# Patient Record
Sex: Male | Born: 1995 | Race: White | Hispanic: No | Marital: Single | Smoking: Never smoker
Health system: Southern US, Community
[De-identification: ages and names within clinical notes are randomized; demographics above are authoritative.]

## PROBLEM LIST (undated history)

## (undated) ENCOUNTER — Ambulatory Visit: Admission: EM | Payer: 59

## (undated) DIAGNOSIS — F419 Anxiety disorder, unspecified: Secondary | ICD-10-CM

## (undated) DIAGNOSIS — B029 Zoster without complications: Secondary | ICD-10-CM

## (undated) DIAGNOSIS — J4520 Mild intermittent asthma, uncomplicated: Secondary | ICD-10-CM

## (undated) DIAGNOSIS — R51 Headache: Secondary | ICD-10-CM

## (undated) DIAGNOSIS — G8929 Other chronic pain: Secondary | ICD-10-CM

## (undated) DIAGNOSIS — R519 Headache, unspecified: Secondary | ICD-10-CM

## (undated) DIAGNOSIS — K5790 Diverticulosis of intestine, part unspecified, without perforation or abscess without bleeding: Secondary | ICD-10-CM

## (undated) DIAGNOSIS — S42009A Fracture of unspecified part of unspecified clavicle, initial encounter for closed fracture: Secondary | ICD-10-CM

## (undated) DIAGNOSIS — Z91018 Allergy to other foods: Secondary | ICD-10-CM

## (undated) DIAGNOSIS — Z8619 Personal history of other infectious and parasitic diseases: Secondary | ICD-10-CM

## (undated) DIAGNOSIS — D126 Benign neoplasm of colon, unspecified: Secondary | ICD-10-CM

## (undated) HISTORY — PX: WISDOM TOOTH EXTRACTION: SHX21

## (undated) HISTORY — DX: Diverticulosis of intestine, part unspecified, without perforation or abscess without bleeding: K57.90

## (undated) HISTORY — DX: Allergy to other foods: Z91.018

## (undated) HISTORY — DX: Benign neoplasm of colon, unspecified: D12.6

## (undated) HISTORY — DX: Other chronic pain: G89.29

## (undated) HISTORY — DX: Anxiety disorder, unspecified: F41.9

## (undated) HISTORY — PX: ESOPHAGOGASTRODUODENOSCOPY: SHX1529

## (undated) HISTORY — DX: Headache: R51

## (undated) HISTORY — DX: Personal history of other infectious and parasitic diseases: Z86.19

## (undated) HISTORY — PX: COLONOSCOPY: SHX174

## (undated) HISTORY — DX: Fracture of unspecified part of unspecified clavicle, initial encounter for closed fracture: S42.009A

## (undated) HISTORY — DX: Headache, unspecified: R51.9

## (undated) HISTORY — DX: Mild intermittent asthma, uncomplicated: J45.20

## (undated) HISTORY — DX: Zoster without complications: B02.9

---
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
## 2017-11-09 ENCOUNTER — Ambulatory Visit (HOSPITAL_COMMUNITY)
Admission: RE | Admit: 2017-11-09 | Discharge: 2017-11-09 | Disposition: A | Discharge status: Home / Self Care | Payer: 59 | Source: Ambulatory Visit | Attending: Physician Assistant | Admitting: Physician Assistant

## 2017-11-09 ENCOUNTER — Other Ambulatory Visit (HOSPITAL_COMMUNITY): Payer: Self-pay | Admitting: Physician Assistant

## 2017-11-09 DIAGNOSIS — Z5309 Procedure and treatment not carried out because of other contraindication: Secondary | ICD-10-CM

## 2017-11-09 DIAGNOSIS — Z7689 Persons encountering health services in other specified circumstances: Secondary | ICD-10-CM | POA: Diagnosis not present

## 2017-11-09 DIAGNOSIS — R51 Headache: Principal | ICD-10-CM

## 2017-11-09 DIAGNOSIS — R519 Headache, unspecified: Secondary | ICD-10-CM

## 2017-11-11 ENCOUNTER — Ambulatory Visit (HOSPITAL_COMMUNITY)
Admission: RE | Admit: 2017-11-11 | Discharge: 2017-11-11 | Disposition: A | Discharge status: Home / Self Care | Payer: 59 | Source: Ambulatory Visit | Attending: Physician Assistant | Admitting: Physician Assistant

## 2017-11-11 DIAGNOSIS — R51 Headache: Secondary | ICD-10-CM | POA: Insufficient documentation

## 2017-11-11 DIAGNOSIS — R519 Headache, unspecified: Secondary | ICD-10-CM

## 2017-11-11 MED ORDER — GADOBENATE DIMEGLUMINE 529 MG/ML IV SOLN
20.0000 mL | Freq: Once | INTRAVENOUS | Status: AC | PRN
  Administered 2017-11-11: 20 mL via INTRAVENOUS

## 2017-11-12 ENCOUNTER — Other Ambulatory Visit: Payer: Self-pay | Admitting: Physician Assistant

## 2017-11-12 DIAGNOSIS — R51 Headache: Principal | ICD-10-CM

## 2017-11-12 DIAGNOSIS — G4486 Cervicogenic headache: Secondary | ICD-10-CM

## 2017-11-13 ENCOUNTER — Ambulatory Visit: Payer: 59 | Admitting: Neurology

## 2017-11-13 ENCOUNTER — Encounter: Payer: Self-pay | Admitting: Neurology

## 2017-11-13 ENCOUNTER — Other Ambulatory Visit: Payer: Self-pay

## 2017-11-13 ENCOUNTER — Telehealth: Payer: Self-pay | Admitting: Neurology

## 2017-11-13 VITALS — BP 141/83 | HR 95 | Ht 77.0 in | Wt 339.0 lb

## 2017-11-13 DIAGNOSIS — R9089 Other abnormal findings on diagnostic imaging of central nervous system: Secondary | ICD-10-CM

## 2017-11-13 MED ORDER — PREDNISONE 5 MG PO TABS: ORAL_TABLET | ORAL | 0 refills | Status: DC
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
## 2017-11-13 NOTE — Telephone Encounter (Signed)
I called the patient.  The patient likely has a migraine type headache, the patient does have a blood vessel abnormality of some sort in the head that likely is benign, we will follow this over time with a repeat MRI of the brain.

## 2017-11-13 NOTE — Patient Instructions (Signed)
  We will try a 6 day course of prednisone.

## 2017-11-13 NOTE — Telephone Encounter (Signed)
Pt states he does not recall his diagnosis, he is asking for a call back with it please

## 2017-11-14 ENCOUNTER — Ambulatory Visit: Payer: 59 | Admitting: Neurology

## 2017-11-14 ENCOUNTER — Encounter: Payer: Self-pay | Admitting: Neurology

## 2017-11-15 ENCOUNTER — Ambulatory Visit: Payer: 59 | Admitting: Neurology

## 2017-11-19 ENCOUNTER — Other Ambulatory Visit: Payer: Self-pay | Admitting: Neurology

## 2017-11-19 ENCOUNTER — Encounter: Payer: Self-pay | Admitting: Neurology

## 2017-11-19 MED ORDER — PROPRANOLOL HCL 20 MG PO TABS: 20.0000 mg | ORAL_TABLET | Freq: Two times a day (BID) | ORAL | 3 refills | Status: DC

## 2017-11-23 ENCOUNTER — Encounter: Payer: Self-pay | Admitting: Neurology

## 2017-11-26 ENCOUNTER — Ambulatory Visit: Payer: 59 | Admitting: Physician Assistant

## 2017-11-26 ENCOUNTER — Encounter: Payer: Self-pay | Admitting: Physician Assistant

## 2017-11-26 ENCOUNTER — Other Ambulatory Visit: Payer: Self-pay

## 2017-11-26 VITALS — BP 120/80 | HR 62 | Temp 98.3°F | Resp 16 | Ht 77.0 in | Wt 338.0 lb

## 2017-11-26 DIAGNOSIS — L810 Postinflammatory hyperpigmentation: Secondary | ICD-10-CM

## 2017-11-26 DIAGNOSIS — R9389 Abnormal findings on diagnostic imaging of other specified body structures: Secondary | ICD-10-CM | POA: Diagnosis not present

## 2017-11-26 DIAGNOSIS — L738 Other specified follicular disorders: Secondary | ICD-10-CM

## 2017-11-26 NOTE — Progress Notes (Signed)
Patient presents to clinic today to establish care.  Acute Concerns: Patient with chronic headaches, followed by Neurology (Dr. Jannifer Franklin). Recently had an MRI which was discussed with him in detail by Neurologist. Is nervous about findings and wanted a second opinion regarding results.   Patient also endorses a bump on posterior scrotum that has been present for several years. Is not painful or pruritic. Denies change in size of lesion. Has had prior to ever being sexually active.   Patient also notes a dark spot on his back that he would like looked at. States he previously had a boil in this area that drained on its own and was treated with ABX. Has noticed the skin being much darker since then. Denies pain, itch.   Health Maintenance: Immunizations -- up-to-date per patient.  Past Medical History:  Diagnosis Date  . Chronic headaches   . Clavicular fracture   . History of chickenpox   . Shingles    22 years old.    Past Surgical History:  Procedure Laterality Date  . WISDOM TOOTH EXTRACTION      Current Outpatient Medications on File Prior to Visit  Medication Sig Dispense Refill  . propranolol (INDERAL) 20 MG tablet Take 1 tablet (20 mg total) by mouth 2 (two) times daily. 60 tablet 3   No current facility-administered medications on file prior to visit.     No Known Allergies  Family History  Problem Relation Age of Onset  . Hypertension Mother   . Diabetes Father        type II  . Migraines Sister   . Diabetes Maternal Grandmother     Social History   Socioeconomic History  . Marital status: Single    Spouse name: Not on file  . Number of children: 0  . Years of education: College  . Highest education level: Not on file  Social Needs  . Financial resource strain: Not on file  . Food insecurity - worry: Not on file  . Food insecurity - inability: Not on file  . Transportation needs - medical: Not on file  . Transportation needs - non-medical: Not on  file  Occupational History  . Occupation: Sales executive  Tobacco Use  . Smoking status: Never Smoker  . Smokeless tobacco: Never Used  Substance and Sexual Activity  . Alcohol use: Yes    Frequency: Never    Comment: maybe 3 per week  . Drug use: Yes    Types: Marijuana    Comment: rare use  . Sexual activity: Yes    Partners: Female  Other Topics Concern  . Not on file  Social History Narrative   Lives alone   Caffeine use: 1 cup coffee per day   Right handed    Review of Systems  Constitutional: Negative for chills and fever.  HENT: Negative for hearing loss.   Eyes: Negative for blurred vision and double vision.  Respiratory: Negative for cough and sputum production.   Cardiovascular: Negative for chest pain and palpitations.  Skin: Negative for itching and rash.       + skin lesions.  Neurological: Positive for headaches. Negative for dizziness and loss of consciousness.  Psychiatric/Behavioral: Negative for depression, hallucinations, substance abuse and suicidal ideas. The patient is not nervous/anxious.    BP 120/80   Pulse 62   Temp 98.3 F (36.8 C) (Oral)   Resp 16   Ht 6\' 5"  (1.956 m)   Wt (!) 338 lb (153.3 kg)  SpO2 99%   BMI 40.08 kg/m   Physical Exam  Constitutional: He is oriented to person, place, and time and well-developed, well-nourished, and in no distress.  HENT:  Head: Normocephalic and atraumatic.  Eyes: Conjunctivae and EOM are normal. Pupils are equal, round, and reactive to light.  Neck: Neck supple.  Cardiovascular: Normal rate, regular rhythm, normal heart sounds and intact distal pulses.  Pulmonary/Chest: Effort normal and breath sounds normal. No respiratory distress. He has no wheezes. He has no rales. He exhibits no tenderness.  Genitourinary: Testes/scrotum normal and penis normal.  Genitourinary Comments: 1 cc cutaneous lesion of scrotum identified. Is mobile and soft. No fluctuance. Seems consistent with an enlarged ectopic  sebaceous gland or fordyce spot.  Neurological: He is alert and oriented to person, place, and time.  Skin: Skin is warm and dry. No rash noted.     Psychiatric: Affect normal.  Vitals reviewed.  Assessment/Plan: 1. Postinflammatory hyperpigmentation Reassurance given. This will take time to lighten. Reviewed OTC preparations that can help but are not a necessity.  2. Sebaceous gland hyperplasia of scrotum Reassurance given. These are only an issue if they become inflamed or infected. Reviewed symptoms/changes to look out for. Otherwise GU examination unremarkable.  3. Abnormal MRI MRI obtained by specialist due to headaches. MRI revealed "4 mm focus of juxtacortical enhancement in the parasagittal left frontal lobe. No edema or other lesions are identified. A tiny vascular malformation is favored, and a follow-up brain MRI is ecommended in 6 months to ensure stability." Agree with specialist that this is nothing to be overly concerned about. Does not seem consistent with an aneurysm but do recommend he follow-up with Neurology as scheduled and have repeat MRI/MRA in 3-6 months as recommended by Neurology and Radiology.    Leeanne Rio, PA-C

## 2017-11-26 NOTE — Patient Instructions (Signed)
It was nice to meet you today.  Please continue the care directed by Dr. Jannifer Franklin. I agree with his assessment of your MRI results. I would recommend that you do proceed with repeat MRI and MRA in 3 months to verify stability. No change to activity level or daily life needed!  The area on the back is an area of postinflammatory hyperpigmentation, darkening of the skin secondary to inflammation. This will take some time to return to a lighter shade but is nothing worrisome.  The lesion on the scrotum seems like an enlarged ectopic sebaceous (oil) gland. Nothing worrisome. Nothing further needed unless it ever gets red, angry or tender (signs of infection).

## 2017-11-27 ENCOUNTER — Encounter: Payer: Self-pay | Admitting: Physician Assistant

## 2017-11-27 ENCOUNTER — Ambulatory Visit: Payer: 59 | Admitting: Physician Assistant

## 2017-11-27 ENCOUNTER — Ambulatory Visit: Payer: Self-pay

## 2017-11-27 ENCOUNTER — Other Ambulatory Visit: Payer: Self-pay

## 2017-11-27 VITALS — BP 124/74 | HR 73 | Temp 98.4°F | Resp 17 | Ht 77.0 in | Wt 339.6 lb

## 2017-11-27 DIAGNOSIS — F419 Anxiety disorder, unspecified: Secondary | ICD-10-CM | POA: Diagnosis not present

## 2017-11-27 MED ORDER — ESCITALOPRAM OXALATE 10 MG PO TABS: 10.0000 mg | ORAL_TABLET | Freq: Every day | ORAL | 1 refills | Status: DC

## 2017-11-27 MED ORDER — CLONAZEPAM 0.25 MG PO TBDP: 0.2500 mg | ORAL_TABLET | Freq: Two times a day (BID) | ORAL | 0 refills | Status: DC | PRN

## 2017-11-27 NOTE — Patient Instructions (Signed)
Please stay well-hydrated and get plenty of rest. Start the lexapro once daily as directed. Klonopin as directed if needed for acute anxiety.  Follow-up with me in 4 weeks.  Please go to the lab today for blood work.  I will call you with your results. We will alter treatment regimen(s) if indicated by your results.

## 2017-11-27 NOTE — Progress Notes (Signed)
Patient presents to clinic today c/o generalized anxiety over the past few years that has recently worsened due to health stressors. Notes continued generalized anxiety with repetitive thoughts and worry. Is affecting his day to day life. Denies depressed mood or anhedonia. Has noted over the past 3 days of having episodes of chest tightness or pain lasting a few seconds each before resolving. Seem to happen when he is anxious. Denies heart burn, indigestion, cough, fever, chills, palpitations, LH, dizziness or SOB. Episodes occur at rest and are not worsened with exacerbation.   Past Medical History:  Diagnosis Date  . Anxiety   . Chronic headaches   . Clavicular fracture   . History of chickenpox   . Shingles    22 years old.    Current Outpatient Medications on File Prior to Visit  Medication Sig Dispense Refill  . propranolol (INDERAL) 20 MG tablet Take 1 tablet (20 mg total) by mouth 2 (two) times daily. 60 tablet 3   No current facility-administered medications on file prior to visit.     No Known Allergies  Family History  Problem Relation Age of Onset  . Hypertension Mother   . Diabetes Father        type II  . Migraines Sister   . Diabetes Maternal Grandmother     Social History   Socioeconomic History  . Marital status: Single    Spouse name: None  . Number of children: 0  . Years of education: College  . Highest education level: None  Social Needs  . Financial resource strain: None  . Food insecurity - worry: None  . Food insecurity - inability: None  . Transportation needs - medical: None  . Transportation needs - non-medical: None  Occupational History  . Occupation: Sales executive  Tobacco Use  . Smoking status: Never Smoker  . Smokeless tobacco: Never Used  Substance and Sexual Activity  . Alcohol use: Yes    Frequency: Never    Comment: maybe 3 per week  . Drug use: Yes    Types: Marijuana    Comment: rare use  . Sexual activity: Yes   Partners: Female  Other Topics Concern  . None  Social History Narrative   Lives alone   Caffeine use: 1 cup coffee per day   Right handed    Review of Systems - See HPI.  All other ROS are negative.  BP 124/74   Pulse 73   Temp 98.4 F (36.9 C) (Oral)   Resp 17   Ht 6\' 5"  (1.956 m)   Wt (!) 339 lb 9.6 oz (154 kg)   SpO2 98%   BMI 40.27 kg/m   Physical Exam  Constitutional: He is oriented to person, place, and time and well-developed, well-nourished, and in no distress.  HENT:  Head: Normocephalic.  Eyes: Conjunctivae are normal.  Neck: Neck supple.  Cardiovascular: Normal rate, regular rhythm, normal heart sounds and intact distal pulses.  Pulmonary/Chest: Effort normal and breath sounds normal. No respiratory distress. He has no wheezes. He has no rales. He exhibits no tenderness.  Abdominal: Soft. Bowel sounds are normal. He exhibits no distension. There is no tenderness. There is no rebound and no guarding.  Neurological: He is alert and oriented to person, place, and time. No cranial nerve deficit.  Skin: Skin is warm and dry. No rash noted.  Psychiatric: Affect normal.  Vitals reviewed.  Assessment/Plan: 1. Anxiety Long-standing GAD with worsening symptoms recently due to health stressors. Patient  low risk for ACS. Symptoms do not fit a cardiac picture. EKG obtained with NSR. Will start Lexapro 10 mg daily. Klonopin PRN for acute anxiety. Will check TSH today. Strict ER precautions given to patient. Follow-up scheduled.   - EKG 12-Lead - TSH - escitalopram (LEXAPRO) 10 MG tablet; Take 1 tablet (10 mg total) by mouth daily.  Dispense: 30 tablet; Refill: 1 - clonazePAM (KLONOPIN) 0.25 MG disintegrating tablet; Take 1 tablet (0.25 mg total) by mouth 2 (two) times daily as needed for seizure.  Dispense: 10 tablet; Refill: 0   Leeanne Rio, PA-C

## 2017-11-27 NOTE — Telephone Encounter (Signed)
Provider has been alerted of this appointment.

## 2017-11-27 NOTE — Telephone Encounter (Signed)
Patient called in with c/o "chest pain and anxiety." He says "I was just seen in the office yesterday and mention about anxiety. I would like to come in today if possible to discuss it. The pain is off and on, not having pain now." Appointment scheduled for today with Elyn Aquas, PAC at 1500, patient verbalized understanding.

## 2017-11-28 ENCOUNTER — Ambulatory Visit: Payer: 59 | Admitting: Physician Assistant

## 2017-11-28 DIAGNOSIS — F419 Anxiety disorder, unspecified: Secondary | ICD-10-CM | POA: Insufficient documentation

## 2017-11-28 LAB — TSH: TSH: 1.02 u[IU]/mL (ref 0.35–4.50)

## 2017-11-29 ENCOUNTER — Encounter: Payer: Self-pay | Admitting: Physician Assistant

## 2017-11-29 ENCOUNTER — Encounter: Payer: Self-pay | Admitting: Neurology

## 2017-11-30 ENCOUNTER — Encounter: Payer: Self-pay | Admitting: Physician Assistant

## 2017-11-30 ENCOUNTER — Ambulatory Visit: Payer: 59 | Admitting: Physician Assistant

## 2017-12-01 ENCOUNTER — Encounter: Payer: Self-pay | Admitting: Physician Assistant

## 2017-12-01 DIAGNOSIS — G8929 Other chronic pain: Secondary | ICD-10-CM

## 2017-12-01 DIAGNOSIS — M25562 Pain in left knee: Principal | ICD-10-CM

## 2017-12-05 ENCOUNTER — Encounter: Payer: Self-pay | Admitting: Physician Assistant

## 2017-12-05 ENCOUNTER — Ambulatory Visit: Payer: 59 | Admitting: Physician Assistant

## 2017-12-05 ENCOUNTER — Other Ambulatory Visit: Payer: Self-pay

## 2017-12-05 VITALS — BP 136/82 | HR 64 | Temp 98.1°F | Resp 16 | Ht 77.0 in | Wt 337.0 lb

## 2017-12-05 DIAGNOSIS — G8929 Other chronic pain: Secondary | ICD-10-CM | POA: Diagnosis not present

## 2017-12-05 DIAGNOSIS — M25562 Pain in left knee: Secondary | ICD-10-CM

## 2017-12-05 DIAGNOSIS — N50819 Testicular pain, unspecified: Secondary | ICD-10-CM | POA: Diagnosis not present

## 2017-12-05 LAB — POCT URINALYSIS DIPSTICK
BILIRUBIN UA: NEGATIVE
Blood, UA: NEGATIVE
GLUCOSE UA: NEGATIVE
KETONES UA: NEGATIVE
LEUKOCYTES UA: NEGATIVE
Nitrite, UA: NEGATIVE
Protein, UA: NEGATIVE
SPEC GRAV UA: 1.025 (ref 1.010–1.025)
Urobilinogen, UA: 0.2 E.U./dL
pH, UA: 5 (ref 5.0–8.0)

## 2017-12-05 NOTE — Patient Instructions (Signed)
Please keep hydrated. Wear supportive undergarments.  Ibuprofen for the next 3-4 days (knee and groin)  If any recurrence, we will want to get you back in with Urology.   For the knee, I am setting you up with one of our Sports Medicine specialists. Wear the knee brace.  Elevate the leg while resting.

## 2017-12-05 NOTE — Progress Notes (Signed)
Patient presents to clinic today to discuss chronic intermittent pain in L knee. States when pain occurs, the pain is mostly posterior and aching. Sometimes notes some aching pain underneath patella. Endorses a hyperextension injury about 6 years ago. Has noted this intermittent pain since. States he has had unremarkable x-rays previously. States that pain occurs maybe once or twice a month and lasts 1-2 days. When this occurs he wears a knee brace and applies topical Icy Hot to the area which helps. Denies swelling, redness. Denies numbness or tingling of extremity. Denies decreased ROM but endorses tightness with flexion of the knee.   Patient also notes a mild ache in testicles this morning with urination. Denies urinary urgency, frequency or hematuria. Denies nausea/vomiting. Denies penile pain or discharge. Is not currently sexually active. Notes that he has seen a Urologist for aching in L testicle before. Notes he had an [place] and was told he had a varicocele.   Past Medical History:  Diagnosis Date  . Anxiety   . Chronic headaches   . Clavicular fracture   . History of chickenpox   . Shingles    22 years old.    Current Outpatient Medications on File Prior to Visit  Medication Sig Dispense Refill  . clonazePAM (KLONOPIN) 0.25 MG disintegrating tablet Take 1 tablet (0.25 mg total) by mouth 2 (two) times daily as needed for seizure. 10 tablet 0  . escitalopram (LEXAPRO) 10 MG tablet Take 1 tablet (10 mg total) by mouth daily. 30 tablet 1  . propranolol (INDERAL) 20 MG tablet Take 1 tablet (20 mg total) by mouth 2 (two) times daily. 60 tablet 3   No current facility-administered medications on file prior to visit.     No Known Allergies  Family History  Problem Relation Age of Onset  . Hypertension Mother   . Diabetes Father        type II  . Migraines Sister   . Diabetes Maternal Grandmother     Social History   Socioeconomic History  . Marital status: Single    Spouse  name: None  . Number of children: 0  . Years of education: College  . Highest education level: None  Social Needs  . Financial resource strain: None  . Food insecurity - worry: None  . Food insecurity - inability: None  . Transportation needs - medical: None  . Transportation needs - non-medical: None  Occupational History  . Occupation: Sales executive  Tobacco Use  . Smoking status: Never Smoker  . Smokeless tobacco: Never Used  Substance and Sexual Activity  . Alcohol use: Yes    Frequency: Never    Comment: maybe 3 per week  . Drug use: Yes    Types: Marijuana    Comment: rare use  . Sexual activity: Yes    Partners: Female  Other Topics Concern  . None  Social History Narrative   Lives alone   Caffeine use: 1 cup coffee per day   Right handed    Review of Systems - See HPI.  All other ROS are negative.  BP 136/82   Pulse 64   Temp 98.1 F (36.7 C) (Oral)   Resp 16   Ht 6\' 5"  (1.956 m)   Wt (!) 337 lb (152.9 kg)   SpO2 98%   BMI 39.96 kg/m   Physical Exam  Constitutional: He is oriented to person, place, and time and well-developed, well-nourished, and in no distress.  HENT:  Head: Normocephalic and  atraumatic.  Eyes: Conjunctivae are normal.  Cardiovascular: Normal rate, regular rhythm, normal heart sounds and intact distal pulses.  Pulmonary/Chest: Effort normal and breath sounds normal. No respiratory distress. He has no wheezes. He has no rales. He exhibits no tenderness.  Genitourinary: Penis normal. He exhibits no abnormal testicular mass, no testicular tenderness, no abnormal scrotal mass and no scrotal tenderness.  Musculoskeletal:       Left knee: Normal. No tenderness found.  Neurological: He is alert and oriented to person, place, and time.  Skin: Skin is warm and dry. No rash noted.  Vitals reviewed.  Recent Results (from the past 2160 hour(s))  TSH     Status: None   Collection Time: 11/27/17  3:51 PM  Result Value Ref Range   TSH 1.02 0.35  - 4.50 uIU/mL  POCT urinalysis dipstick     Status: Normal   Collection Time: 12/05/17  1:51 PM  Result Value Ref Range   Color, UA yellow    Clarity, UA clear    Glucose, UA negative    Bilirubin, UA negative    Ketones, UA negative    Spec Grav, UA 1.025 1.010 - 1.025   Blood, UA negative    pH, UA 5.0 5.0 - 8.0   Protein, UA negative    Urobilinogen, UA 0.2 0.2 or 1.0 E.U./dL   Nitrite, UA negative    Leukocytes, UA Negative Negative   Appearance     Odor      Assessment/Plan: 1. Testicular pain One episode this morning. Resolved. Urine dip unremarkable. Exam unremarkable. Patient with history of left varicocele noted on [place] from last fall. Discussed supportive measures. If recurring need follow-up with Urology. - POCT urinalysis dipstick  2. Chronic pain of left knee Intermittent. Cannot reproduce on examination. RICE discussed. Bracing applied. Will set up with Sports medicine for further assessment.    Leeanne Rio, PA-C

## 2017-12-08 ENCOUNTER — Emergency Department (HOSPITAL_COMMUNITY): Payer: 59

## 2017-12-08 ENCOUNTER — Encounter (HOSPITAL_COMMUNITY): Payer: Self-pay | Admitting: Emergency Medicine

## 2017-12-08 ENCOUNTER — Emergency Department (HOSPITAL_COMMUNITY)
Admission: EM | Admit: 2017-12-08 | Discharge: 2017-12-08 | Disposition: A | Discharge status: Home / Self Care | Payer: 59 | Attending: Emergency Medicine | Admitting: Emergency Medicine

## 2017-12-08 DIAGNOSIS — R0789 Other chest pain: Secondary | ICD-10-CM | POA: Insufficient documentation

## 2017-12-08 DIAGNOSIS — Z79899 Other long term (current) drug therapy: Secondary | ICD-10-CM | POA: Diagnosis not present

## 2017-12-08 LAB — CBC
HEMATOCRIT: 46.6 % (ref 39.0–52.0)
HEMOGLOBIN: 15.4 g/dL (ref 13.0–17.0)
MCH: 30.6 pg (ref 26.0–34.0)
MCHC: 33 g/dL (ref 30.0–36.0)
MCV: 92.5 fL (ref 78.0–100.0)
Platelets: 219 10*3/uL (ref 150–400)
RBC: 5.04 MIL/uL (ref 4.22–5.81)
RDW: 12.6 % (ref 11.5–15.5)
WBC: 7.6 10*3/uL (ref 4.0–10.5)

## 2017-12-08 LAB — BASIC METABOLIC PANEL
ANION GAP: 8 (ref 5–15)
BUN: 10 mg/dL (ref 6–20)
CHLORIDE: 104 mmol/L (ref 101–111)
CO2: 27 mmol/L (ref 22–32)
Calcium: 9.6 mg/dL (ref 8.9–10.3)
Creatinine, Ser: 0.76 mg/dL (ref 0.61–1.24)
Glucose, Bld: 95 mg/dL (ref 65–99)
POTASSIUM: 4.3 mmol/L (ref 3.5–5.1)
SODIUM: 139 mmol/L (ref 135–145)

## 2017-12-08 LAB — I-STAT TROPONIN, ED: Troponin i, poc: 0.01 ng/mL (ref 0.00–0.08)

## 2017-12-08 LAB — D-DIMER, QUANTITATIVE (NOT AT ARMC)

## 2017-12-08 NOTE — ED Triage Notes (Signed)
Patient here from home with complaints of chest pain x1 week. Reports that chest pain is in the center and radiates to the left side of chest. Seen primary 3/13, no diagnosis.

## 2017-12-08 NOTE — ED Notes (Signed)
Pt left after given paperwork, refused to sign.

## 2017-12-08 NOTE — Discharge Instructions (Signed)

## 2017-12-08 NOTE — ED Provider Notes (Signed)
[facility] DEPT Provider Note   CSN: [id number] Arrival date & time: 12/08/17  1248     History   Chief Complaint Chief Complaint  Patient presents with  . Chest Pain    HPI SHONN FARRUGGIA is a 22 y.o. male who presents emergency department chief complaint of chest pain.  Patient states that he has had intermittent chest pain which comes and goes.  It is retrosternal.  At times he had pain on the left or on the right and down his right arm yesterday.  He is being treated for anxiety.  He had an EKG at his primary care doctor's office a week ago that was normal.  Patient states that he was working out running a mile yesterday and he noticed the pain a bit more.  He denies diaphoresis, shortness of breath, nausea, radiating pain into the jaw or arm.  He has noticed more belching as of late.  He has no current chest pain.  Patient had a physical this year.  He states he has no high cholesterol, no high blood pressure, no family history of MI.  He does not smoke.  No history of diabetes  HPI  Past Medical History:  Diagnosis Date  . Anxiety   . Chronic headaches   . Clavicular fracture   . History of chickenpox   . Shingles    22 years old.    Patient Active Problem List   Diagnosis Date Noted  . Anxiety 11/28/2017    Past Surgical History:  Procedure Laterality Date  . WISDOM TOOTH EXTRACTION         Home Medications    Prior to Admission medications   Medication Sig Start Date End Date Taking? Authorizing Provider  acetaminophen (TYLENOL) 500 MG tablet Take 1,000 mg by mouth every 6 (six) hours as needed for mild pain or headache.   Yes [provider]  baclofen (LIORESAL) 20 MG tablet Take 20 mg by mouth daily as needed for muscle spasms.   Yes [provider]  clonazePAM (KLONOPIN) 0.25 MG disintegrating tablet Take 1 tablet (0.25 mg total) by mouth 2 (two) times daily as needed for seizure. 11/27/17  Yes Brunetta Jeans, PA-C  escitalopram (LEXAPRO) 10 MG tablet Take 1 tablet (10 mg total) by mouth daily. 11/27/17  Yes Brunetta Jeans, PA-C  propranolol (INDERAL) 20 MG tablet Take 1 tablet (20 mg total) by mouth 2 (two) times daily. 11/19/17  Yes Kathrynn Ducking, MD    Family History Family History  Problem Relation Age of Onset  . Hypertension Mother   . Diabetes Father        type II  . Migraines Sister   . Diabetes Maternal Grandmother     Social History Social History   Tobacco Use  . Smoking status: Never Smoker  . Smokeless tobacco: Never Used  Substance Use Topics  . Alcohol use: Yes    Frequency: Never    Comment: maybe 3 per week  . Drug use: Yes    Types: Marijuana    Comment: rare use     Allergies   Hydrocodone   Review of Systems Review of Systems Ten systems reviewed and are negative for acute change, except as noted in the HPI.   Physical Exam Updated Vital Signs BP (!) 143/95 (BP Location: Left Arm)   Pulse 68   Temp 98.5 F (36.9 C) (Oral)   Resp 18   Ht 6\' 5"  (1.956  m)   Wt (!) 152 kg (335 lb)   SpO2 99%   BMI 39.73 kg/m   Physical Exam  Constitutional: He appears well-developed and well-nourished. No distress.  HENT:  Head: Normocephalic and atraumatic.  Eyes: Conjunctivae are normal. No scleral icterus.  Neck: Normal range of motion. Neck supple.  Cardiovascular: Normal rate, regular rhythm and normal heart sounds.  Pulmonary/Chest: Effort normal and breath sounds normal. No respiratory distress.  Abdominal: Soft. There is no tenderness.  Musculoskeletal: He exhibits no edema.  Neurological: He is alert.  Skin: Skin is warm and dry. He is not diaphoretic.  Psychiatric: His behavior is normal.  Nursing note and vitals reviewed.    ED Treatments / Results  Labs (all labs ordered are listed, but only abnormal results are displayed) Labs Reviewed  BASIC METABOLIC PANEL  CBC  D-DIMER, QUANTITATIVE (NOT AT [facility])  I-STAT TROPONIN, ED     EKG  EKG Interpretation  Date/Time:  Saturday December 08 2017 13:19:07 EDT Ventricular Rate:  67 PR Interval:    QRS Duration: 104 QT Interval:  371 QTC Calculation: 392 R Axis:   36 Text Interpretation:  Sinus rhythm NO STEMI No old tracing to compare Confirmed by Addison Lank [phone]) on 12/08/2017 8:55:00 PM       Radiology Dg Chest 2 View  Result Date: 12/08/2017 CLINICAL DATA:  Chest pain.  Short of breath. EXAM: CHEST - 2 VIEW COMPARISON:  None. FINDINGS: Normal heart size. Lungs clear. No pneumothorax. No pleural effusion. IMPRESSION: No active cardiopulmonary disease. Electronically Signed   By: Marybelle Killings M.D.   On: 12/08/2017 14:41    Procedures Procedures (including critical care time)  Medications Ordered in ED Medications - No data to display   Initial Impression / Assessment and Plan / ED Course  I have reviewed the triage vital signs and the nursing notes.  Pertinent labs & imaging results that were available during my care of the patient were reviewed by me and considered in my medical decision making (see chart for details).     Patient with heart score of 1, EKG without abnormality, negative chest x-ray, normal troponin.  PERC negative.  Advised follow-up with cardiology for stress test.  He appears appropriate for discharge at this time  Final Clinical Impressions(s) / ED Diagnoses   Final diagnoses:  Atypical chest pain    ED Discharge Orders    None       Margarita Mail, PA-C 12/08/17 2056    Fatima Blank, MD 12/09/17 [phone]

## 2017-12-12 NOTE — Telephone Encounter (Signed)
Referral hs been placed.

## 2017-12-13 ENCOUNTER — Encounter: Payer: Self-pay | Admitting: Physician Assistant

## 2017-12-13 ENCOUNTER — Telehealth: Payer: Self-pay | Admitting: Physician Assistant

## 2017-12-13 NOTE — Telephone Encounter (Signed)
Copied from [place] [phone]. Topic: Quick Communication - See Telephone Encounter >> Dec 13, 2017  3:52 PM Neva Seat wrote: Pt needing to discuss Lipase levels on recent labs w/ Dr. Hassell Done or CMA - ASAP

## 2017-12-13 NOTE — Telephone Encounter (Signed)
Patient states that he had bloodwork done and his lipase level is elevated.  I do not see any results since we drew blood.  I have responded to his MyChart message asking where he had the labs drawn.  Once we know that we can better advise him on what he needs to do.

## 2017-12-14 ENCOUNTER — Ambulatory Visit: Payer: 59 | Admitting: Physician Assistant

## 2017-12-14 ENCOUNTER — Telehealth: Payer: Self-pay | Admitting: Physician Assistant

## 2017-12-14 ENCOUNTER — Encounter: Payer: Self-pay | Admitting: Physician Assistant

## 2017-12-14 ENCOUNTER — Ambulatory Visit (HOSPITAL_BASED_OUTPATIENT_CLINIC_OR_DEPARTMENT_OTHER)
Admission: RE | Admit: 2017-12-14 | Discharge: 2017-12-14 | Disposition: A | Discharge status: Home / Self Care | Payer: 59 | Source: Ambulatory Visit | Attending: Physician Assistant | Admitting: Physician Assistant

## 2017-12-14 ENCOUNTER — Ambulatory Visit: Payer: 59 | Admitting: Sports Medicine

## 2017-12-14 ENCOUNTER — Other Ambulatory Visit: Payer: Self-pay

## 2017-12-14 ENCOUNTER — Encounter: Payer: Self-pay | Admitting: Sports Medicine

## 2017-12-14 VITALS — BP 118/80 | HR 64 | Ht 77.0 in | Wt 338.2 lb

## 2017-12-14 VITALS — BP 122/74 | HR 69 | Temp 97.8°F | Resp 16 | Ht 77.0 in | Wt 338.2 lb

## 2017-12-14 DIAGNOSIS — R1084 Generalized abdominal pain: Secondary | ICD-10-CM

## 2017-12-14 DIAGNOSIS — G8929 Other chronic pain: Secondary | ICD-10-CM

## 2017-12-14 DIAGNOSIS — M25562 Pain in left knee: Secondary | ICD-10-CM | POA: Diagnosis not present

## 2017-12-14 LAB — CBC WITH DIFFERENTIAL/PLATELET
BASOS PCT: 0.8 % (ref 0.0–3.0)
Basophils Absolute: 0.1 10*3/uL (ref 0.0–0.1)
EOS ABS: 0.1 10*3/uL (ref 0.0–0.7)
Eosinophils Relative: 2 % (ref 0.0–5.0)
HCT: 43.2 % (ref 39.0–52.0)
Hemoglobin: 14.7 g/dL (ref 13.0–17.0)
LYMPHS ABS: 2 10*3/uL (ref 0.7–4.0)
Lymphocytes Relative: 27.2 % (ref 12.0–46.0)
MCHC: 33.9 g/dL (ref 30.0–36.0)
MCV: 90 fl (ref 78.0–100.0)
MONO ABS: 0.4 10*3/uL (ref 0.1–1.0)
Monocytes Relative: 6 % (ref 3.0–12.0)
NEUTROS ABS: 4.8 10*3/uL (ref 1.4–7.7)
NEUTROS PCT: 64 % (ref 43.0–77.0)
PLATELETS: 199 10*3/uL (ref 150.0–400.0)
RBC: 4.8 Mil/uL (ref 4.22–5.81)
RDW: 12.9 % (ref 11.5–15.5)
WBC: 7.5 10*3/uL (ref 4.0–10.5)

## 2017-12-14 LAB — COMPREHENSIVE METABOLIC PANEL
ALT: 16 U/L (ref 0–53)
AST: 12 U/L (ref 0–37)
Albumin: 4.5 g/dL (ref 3.5–5.2)
Alkaline Phosphatase: 60 U/L (ref 39–117)
BUN: 15 mg/dL (ref 6–23)
CHLORIDE: 105 meq/L (ref 96–112)
CO2: 24 meq/L (ref 19–32)
CREATININE: 0.78 mg/dL (ref 0.40–1.50)
Calcium: 9.3 mg/dL (ref 8.4–10.5)
GFR: 131.72 mL/min (ref >60.00)
GLUCOSE: 103 mg/dL — AB (ref 70–99)
POTASSIUM: 4.1 meq/L (ref 3.5–5.1)
SODIUM: 139 meq/L (ref 135–145)
Total Bilirubin: 0.5 mg/dL (ref 0.2–1.2)
Total Protein: 6.9 g/dL (ref 6.0–8.3)

## 2017-12-14 LAB — LIPASE: Lipase: 18 U/L (ref 11.0–59.0)

## 2017-12-14 NOTE — Telephone Encounter (Signed)
Provider is aware and is contacting patient via mychart

## 2017-12-14 NOTE — Progress Notes (Signed)
   Bradley [name], Bradley [name] at [facility] - 22 y.o. male MRN [id number]  Date of birth: [birth year]  Visit Date:   PCP: Brunetta Jeans, PA-C   Referred by: Brunetta Jeans, PA-C  Please see additional documentation for HPI, review of systems.   HISTORY & PERTINENT PRIOR DATA:  Prior History reviewed and updated per electronic medical record.  Significant/pertinent history, findings, studies include:  reports that he has never smoked. He has never used smokeless tobacco. No results for input(s): HGBA1C, LABURIC, CREATINE in the last 8760 hours. No specialty comments available. No problems updated.  OBJECTIVE:  VS:  HT:6\' 5"  (195.6 cm)   WT:(Abnormal) 338 lb 3.2 oz (153.4 kg)  BMI:40.1    BP:118/80  HR:64bpm  TEMP: ( )  RESP:97 %   PHYSICAL EXAM: Constitutional: WDWN, Non-toxic appearing. Psychiatric: Alert & appropriately interactive.  Not depressed or anxious appearing. Respiratory: No increased work of breathing.  Trachea Midline Eyes: Pupils are equal.  EOM intact without nystagmus.  No scleral icterus  Vascular Exam: warm to touch no edema  lower extremity neuro exam: unremarkable  MSK Exam:  Bilateral Knee  Alignment & Contours: normal Skin: No overlying erythema/ecchymosis Effusion: none and not significant   Generalized Synovitis: mild Knee Tenderness: None and No focal bony TTP Gait: normal and antalgic   RANGE OF MOTION & STRENGTH  EXTENSION: 0  with no pain Strength: Normal FLEXION: Normal with no pain Strength: Normal Hip Abduction strength- 4/5 Recruitment pattern: TFL predominant   LIGAMENTOUS TESTING  Stable to varus and valgus strain with 3-4 mm of opening bilaterally. Anterior and posterior drawer with slight laxity but no appreciable endpoints symmetrically.  He does have some laxity with dial testing markedly different on the left than on the right   SPECIALITY  TESTING:  Crepitation with Patellar Grind: No Patellar Apprehension: No J Sign: Negative Mcmurray's: Negative Thessaly: Negative         ASSESSMENT & PLAN:   1. Chronic pain of left knee     PLAN: I am concerned for potential posterior lateral corner injury given the mechanism of stepping into a hole.  He does have some laxity on the left as well and the question of an underlying ACL tear with posterior lateral corner has to be ruled out.  He does have some symptoms of instability.  Follow-up: Return for MRI review.      Please see additional documentation for Objective, Assessment and Plan sections. Pertinent additional documentation may be included in corresponding procedure notes, imaging studies, problem based documentation and patient instructions. Please see these sections of the encounter for additional information regarding this visit.  CMA/ATC served as Education administrator during this visit. History, Physical, and Plan performed by medical provider. Documentation and orders reviewed and attested to.      Gerda Diss, [facility] Physician

## 2017-12-14 NOTE — Telephone Encounter (Signed)
Copied from [place] [phone]. Topic: Quick Communication - See Telephone Encounter >> Dec 14, 2017 10:04 AM [name] wrote: CRM for notification. See Telephone encounter for: 12/14/17. Patient faxed some labs into the office today.  He wanted his pcp to know he has been feeling nauseated and he can come in today if necessary.

## 2017-12-14 NOTE — Progress Notes (Signed)
  Bradley [name] - 22 y.o. male MRN [id number]  Date of birth: [birth year]  Scribe for today's visit: Josepha Pigg, CMA     SUBJECTIVE:  Bradley [name] is here for Follow-up (L knee pain) .  Referred by: Raiford Noble, PA-C His L knee pain symptoms INITIALLY: Began at age 22 after a fall while running. More recently he has been playing football and noticed an increase in pain, he was seen at urgent care 08/29/17. Described as moderate (6/10) soreness/tenderness, nonradiating Worsened with weight bearing.  Improved with rest.  Additional associated symptoms include: He has noticed clicking and popping in the knee. He denies swelling around the knee. Pain is mostly just under the patella and/or posterior knee. Pain comes and goes and seems to occur randomly.     At this time symptoms are improving compared to onset. For the first 8 months after the initial injury he had pain for 8 months but it has been getting better over time with occasional flare-up.  He finds that sitting in a hot tub helps with the pain. He has tried NSAIDs in the past with minimal relief. He has tried wearing a knee brace in the past with minimal relief. He has also tried applying topical [facility] and gets temporary relief.   Had XR of L knee 08/2017 at [facility] on [facility] - was told it was normal but does not have a copy of XR or report with him today.   ROS Denies night time disturbances. Denies fevers, chills, or night sweats. Denies unexplained weight loss. Denies personal history of cancer. Denies changes in bowel or bladder habits. Denies recent unreported falls. Denies new or worsening dyspnea or wheezing. Denies headaches or dizziness.  Denies numbness, tingling or weakness  In the extremities.  Denies dizziness or presyncopal episodes Denies lower extremity edema      Please see additional documentation for Objective, Assessment and Plan sections. Pertinent additional documentation may be  included in corresponding procedure notes, imaging studies, problem based documentation and patient instructions. Please see these sections of the encounter for additional information regarding this visit.  CMA/ATC served as Education administrator during this visit. History, Physical, and Plan performed by medical provider. Documentation and orders reviewed and attested to.      Gerda Diss, [facility] Physician

## 2017-12-14 NOTE — Patient Instructions (Signed)
Please go to the lab today for blood work.  I will call you with your results. We will alter treatment regimen(s) if indicated by your results.   Keep a liquid diet overnight.  Hold the Lexapro.  Message me in the morning to give me an update on how things are with the changes above.   I have given you a script for Zofran to use in case of severe nausea.  Keep well hydrated and get plenty of rest.   We will figure this out!  Speak with Benjamine Mola up front to schedule Ultrasound.

## 2017-12-14 NOTE — Telephone Encounter (Signed)
Paperwork has been sent over and provider has been alerted.  Will wait on PCP to advise.

## 2017-12-14 NOTE — Progress Notes (Signed)
Patient presents to clinic today c/o intermittent nausea and lower abdominal pain over the past week. Notes stools have been somewhat loose. Notes a bloated sensation. Denies fever, chills or change to diet or urinary symptoms. Was recently started on Lexapro and questioning if this is contributing. Patient states he had lipase level drawn from an employee of our [place] office and was mildly elevated. Was told he needed to be seen.  Past Medical History:  Diagnosis Date  . Anxiety   . Chronic headaches   . Clavicular fracture   . History of chickenpox   . Shingles    22 years old.    Current Outpatient Medications on File Prior to Visit  Medication Sig Dispense Refill  . acetaminophen (TYLENOL) 500 MG tablet Take 1,000 mg by mouth every 6 (six) hours as needed for mild pain or headache.    . clonazePAM (KLONOPIN) 0.25 MG disintegrating tablet Take 1 tablet (0.25 mg total) by mouth 2 (two) times daily as needed for seizure. 10 tablet 0  . escitalopram (LEXAPRO) 10 MG tablet Take 1 tablet (10 mg total) by mouth daily. 30 tablet 1  . propranolol (INDERAL) 20 MG tablet Take 1 tablet (20 mg total) by mouth 2 (two) times daily. 60 tablet 3   No current facility-administered medications on file prior to visit.     Allergies  Allergen Reactions  . Hydrocodone Itching    Pt states he feels itchy after taking, but it doesn't preclude him taking hydrocodone if needed    Family History  Problem Relation Age of Onset  . Hypertension Mother   . Diabetes Father        type II  . Migraines Sister   . Diabetes Maternal Grandmother     Social History   Socioeconomic History  . Marital status: Single    Spouse name: Not on file  . Number of children: 0  . Years of education: College  . Highest education level: Not on file  Occupational History  . Occupation: [employer]  . Financial resource strain: Not on file  . Food insecurity:    Worry: Not on file    Inability:  Not on file  . Transportation needs:    Medical: Not on file    Non-medical: Not on file  Tobacco Use  . Smoking status: Never Smoker  . Smokeless tobacco: Never Used  Substance and Sexual Activity  . Alcohol use: Yes    Frequency: Never    Comment: maybe 3 per week  . Drug use: Yes    Types: Marijuana    Comment: rare use  . Sexual activity: Yes    Partners: Female  Lifestyle  . Physical activity:    Days per week: Not on file    Minutes per session: Not on file  . Stress: Not on file  Relationships  . Social connections:    Talks on phone: Not on file    Gets together: Not on file    Attends religious service: Not on file    Active member of club or organization: Not on file    Attends meetings of clubs or organizations: Not on file    Relationship status: Not on file  Other Topics Concern  . Not on file  Social History Narrative   Lives alone   Caffeine use: 1 cup coffee per day   Right handed    Review of Systems - See HPI.  All other ROS are  negative.  BP 122/74   Pulse 69   Temp 97.8 F (36.6 C) (Oral)   Resp 16   Ht _0  (1.956 m)   Wt (!) 338 lb 3.2 oz (153.4 kg)   SpO2 98%   BMI 40.10 kg/m   Physical Exam  Constitutional: He is oriented to person, place, and time and well-developed, well-nourished, and in no distress.  HENT:  Head: Normocephalic and atraumatic.  Eyes: Conjunctivae are normal.  Neck: Neck supple.  Cardiovascular: Normal rate, regular rhythm, normal heart sounds and intact distal pulses.  Pulmonary/Chest: Effort normal and breath sounds normal. No respiratory distress. He has no wheezes. He has no rales. He exhibits no tenderness.  Abdominal: Soft. He exhibits no distension and no mass. There is generalized tenderness. There is no rebound and no guarding.  Neurological: He is alert and oriented to person, place, and time.  Skin: Skin is warm and dry. No rash noted.  Psychiatric: Affect normal.  Vitals reviewed.  Recent Results  (from the past 2160 hour(s))  TSH     Status: None   Collection Time: 11/27/17  3:51 PM  Result Value Ref Range   TSH 1.02 0.35 - 4.50 uIU/mL  POCT urinalysis dipstick     Status: Normal   Collection Time: 12/05/17  1:51 PM  Result Value Ref Range   Color, UA yellow    Clarity, UA clear    Glucose, UA negative    Bilirubin, UA negative    Ketones, UA negative    Spec Grav, UA 1.025 1.010 - 1.025   Blood, UA negative    pH, UA 5.0 5.0 - 8.0   Protein, UA negative    Urobilinogen, UA 0.2 0.2 or 1.0 E.U./dL   Nitrite, UA negative    Leukocytes, UA Negative Negative   Appearance     Odor    Basic metabolic panel     Status: None   Collection Time: 12/08/17  2:23 PM  Result Value Ref Range   Sodium 139 135 - 145 mmol/L   Potassium 4.3 3.5 - 5.1 mmol/L   Chloride 104 101 - 111 mmol/L   CO2 27 22 - 32 mmol/L   Glucose, Bld 95 65 - 99 mg/dL   BUN 10 6 - 20 mg/dL   Creatinine, Ser 0.76 0.61 - 1.24 mg/dL   Calcium 9.6 8.9 - 10.3 mg/dL   GFR calc non Af Amer >60 >60 mL/min   GFR calc Af Amer >60 >60 mL/min    Comment: (NOTE) The eGFR has been calculated using the CKD EPI equation. This calculation has not been validated in all clinical situations. eGFR's persistently <60 mL/min signify possible Chronic Kidney Disease.    Anion gap 8 5 - 15    Comment: Performed at [facility], [place] [address] [place], [place] [address]  CBC     Status: None   Collection Time: 12/08/17  2:23 PM  Result Value Ref Range   WBC 7.6 4.0 - 10.5 K/uL   RBC 5.04 4.22 - 5.81 MIL/uL   Hemoglobin 15.4 13.0 - 17.0 g/dL   HCT 46.6 39.0 - 52.0 %   MCV 92.5 78.0 - 100.0 fL   MCH 30.6 26.0 - 34.0 pg   MCHC 33.0 30.0 - 36.0 g/dL   RDW 12.6 11.5 - 15.5 %   Platelets 219 150 - 400 K/uL    Comment: Performed at [facility], [place] [address] [place], [place] [address]  D-dimer,  quantitative (not at [facility])     Status: None   Collection Time: 12/08/17  2:23 PM  Result  Value Ref Range   D-Dimer, Quant <0.27 0.00 - 0.50 ug/mL-FEU    Comment: (NOTE) At the manufacturer cut-off of 0.50 ug/mL FEU, this assay has been documented to exclude PE with a sensitivity and negative predictive value of 97 to 99%.  At this time, this assay has not been approved by the FDA to exclude DVT/VTE. Results should be correlated with clinical presentation. Performed at [facility], [place] [address], [place], [place] [address]   I-stat troponin, ED     Status: None   Collection Time: 12/08/17  2:36 PM  Result Value Ref Range   Troponin i, poc 0.01 0.00 - 0.08 ng/mL   Comment 3            Comment: Due to the release kinetics of cTnI, a negative result within the first hours of the onset of symptoms does not rule out myocardial infarction with certainty. If myocardial infarction is still suspected, repeat the test at appropriate intervals.   CBC w/Diff     Status: None   Collection Time: 12/14/17  2:48 PM  Result Value Ref Range   WBC 7.5 4.0 - 10.5 K/uL   RBC 4.80 4.22 - 5.81 Mil/uL   Hemoglobin 14.7 13.0 - 17.0 g/dL   HCT 43.2 39.0 - 52.0 %   MCV 90.0 78.0 - 100.0 fl   MCHC 33.9 30.0 - 36.0 g/dL   RDW 12.9 11.5 - 15.5 %   Platelets 199.0 150.0 - 400.0 K/uL   Neutrophils Relative % 64.0 43.0 - 77.0 %   Lymphocytes Relative 27.2 12.0 - 46.0 %   Monocytes Relative 6.0 3.0 - 12.0 %   Eosinophils Relative 2.0 0.0 - 5.0 %   Basophils Relative 0.8 0.0 - 3.0 %   Neutro Abs 4.8 1.4 - 7.7 K/uL   Lymphs Abs 2.0 0.7 - 4.0 K/uL   Monocytes Absolute 0.4 0.1 - 1.0 K/uL   Eosinophils Absolute 0.1 0.0 - 0.7 K/uL   Basophils Absolute 0.1 0.0 - 0.1 K/uL  Comp Met (CMET)     Status: Abnormal   Collection Time: 12/14/17  2:48 PM  Result Value Ref Range   Sodium 139 135 - 145 mEq/L   Potassium 4.1 3.5 - 5.1 mEq/L   Chloride 105 96 - 112 mEq/L   CO2 24 19 - 32 mEq/L   Glucose, Bld 103 (H) 70 - 99 mg/dL   BUN 15 6 - 23 mg/dL   Creatinine, Ser 0.78 0.40 - 1.50  mg/dL   Total Bilirubin 0.5 0.2 - 1.2 mg/dL   Alkaline Phosphatase 60 39 - 117 U/L   AST 12 0 - 37 U/L   ALT 16 0 - 53 U/L   Total Protein 6.9 6.0 - 8.3 g/dL   Albumin 4.5 3.5 - 5.2 g/dL   Calcium 9.3 8.4 - 10.5 mg/dL   GFR 131.72 >60.00 mL/min  Lipase     Status: None   Collection Time: 12/14/17  2:48 PM  Result Value Ref Range   Lipase 18.0 Repeated and verified X2. 11.0 - 59.0 U/L    Assessment/Plan: Generalized abdominal pain Labs today. Will get stat US to further assess. NPO until [place] assessment. If workup negative, will need to stop the Lexapro as is likely culprit and switch to another medication. - CBC w/Diff - Comp Met (CMET) - Lipase - US Abdomen Complete;  Future     Leeanne Rio, PA-C

## 2017-12-14 NOTE — Patient Instructions (Signed)

## 2017-12-15 ENCOUNTER — Encounter: Payer: Self-pay | Admitting: Physician Assistant

## 2017-12-16 ENCOUNTER — Encounter: Payer: Self-pay | Admitting: Sports Medicine

## 2017-12-17 DIAGNOSIS — R1084 Generalized abdominal pain: Secondary | ICD-10-CM | POA: Insufficient documentation

## 2017-12-17 NOTE — Assessment & Plan Note (Signed)
Labs today. Will get stat US to further assess. NPO until [place] assessment. If workup negative, will need to stop the Lexapro as is likely culprit and switch to another medication. - CBC w/Diff - Comp Met (CMET) - Lipase - US Abdomen Complete; Future

## 2017-12-19 ENCOUNTER — Encounter: Payer: Self-pay | Admitting: Physician Assistant

## 2017-12-19 ENCOUNTER — Encounter: Payer: Self-pay | Admitting: Sports Medicine

## 2017-12-19 ENCOUNTER — Telehealth: Payer: Self-pay | Admitting: Physician Assistant

## 2017-12-19 DIAGNOSIS — R079 Chest pain, unspecified: Secondary | ICD-10-CM

## 2017-12-19 NOTE — Telephone Encounter (Signed)
See note

## 2017-12-19 NOTE — Telephone Encounter (Signed)
Copied from [place]. Topic: Quick Communication - See Telephone Encounter >> Dec 19, 2017  4:48 PM [name] wrote: CRM for notification. See Telephone encounter for: 12/19/17. Patient stated he now has trobbing in his left knee and he wants to know if there's something to worry about or address.

## 2017-12-20 MED ORDER — FLUOXETINE HCL 10 MG PO TABS: 10.0000 mg | ORAL_TABLET | Freq: Every day | ORAL | 1 refills | Status: DC

## 2017-12-20 NOTE — Addendum Note (Signed)
Addended by: Brunetta Jeans on: 12/20/2017 04:30 PM   Modules accepted: Orders

## 2017-12-20 NOTE — Telephone Encounter (Signed)
Spoke with patient and he reports that pain was worse yesterday when sitting with his knee bent. He felt increased pain, pulsating, and throbbing in the knee. He also noticed increased warmth around the knee. He says that sx have calmed down today but not completely resolved. He is scheduled for MRI of the knee 12/25/17.

## 2017-12-20 NOTE — Telephone Encounter (Signed)
Spoke with Dr. Paulla Fore, then called pt and advised to ice the knee and wear compression sleeve. He will contact the office is sx do not improve.

## 2017-12-21 ENCOUNTER — Encounter: Payer: Self-pay | Admitting: Physician Assistant

## 2017-12-21 ENCOUNTER — Ambulatory Visit (INDEPENDENT_AMBULATORY_CARE_PROVIDER_SITE_OTHER): Payer: 59 | Admitting: Cardiology

## 2017-12-21 ENCOUNTER — Encounter: Payer: Self-pay | Admitting: *Deleted

## 2017-12-21 ENCOUNTER — Telehealth: Payer: Self-pay | Admitting: Physician Assistant

## 2017-12-21 ENCOUNTER — Encounter: Payer: Self-pay | Admitting: Cardiology

## 2017-12-21 VITALS — BP 120/70 | HR 74 | Ht 78.0 in | Wt 337.0 lb

## 2017-12-21 DIAGNOSIS — F419 Anxiety disorder, unspecified: Secondary | ICD-10-CM

## 2017-12-21 DIAGNOSIS — R0789 Other chest pain: Secondary | ICD-10-CM | POA: Insufficient documentation

## 2017-12-21 NOTE — Addendum Note (Signed)
Addended by: Austin Miles on: 12/21/2017 08:56 AM   Modules accepted: Orders

## 2017-12-21 NOTE — Progress Notes (Signed)
Cardiology Consultation:    Date:  12/21/2017   ID:  Bradley [name], DOB Sep 22, 1996, MRN [id number]  PCP:  Brunetta Jeans, PA-C  Cardiologist:  Jenne Campus, MD   Referring MD: Brunetta Jeans, PA-C   Chief Complaint  Patient presents with  . Chest Pain    3 weeks to a month at random times  I have a chest pain  History of Present Illness:    Bradley [name] is a 22 y.o. male who is being seen today for the evaluation of chest pain at the request of Delorse Limber.  For last few weeks he is being experiencing chest pain.  He described the pain as heavy sensation in the chest.  There are no aggravating or relieving factor he graded his pain as 3-4 in scale up to 10.  There are no provoking factor.  It can happen anytime.  There is no shortness of breath no sweating associated with this sensation.  He ended up going to the emergency room.  D-dimer troponins were normal EKG was normal as well.  He was referred to [place] for evaluation.  About a month ago he decided to buy a treadmill and started using a treadmill on the regular basis.  He walks at speed of 3.5 mph but then he can run at speed of 6.  He has no difficulty doing it.  There is no chest pain during this exercise but he does get some shortness of breath and fatigue. He does not have any risk factors for coronary artery disease apparently his cholesterol total is 138, he does not have hypertension diabetes he does not smoke he does not have family history of premature coronary artery disease.  Past Medical History:  Diagnosis Date  . Anxiety   . Chronic headaches   . Clavicular fracture   . History of chickenpox   . Shingles    22 years old.    Past Surgical History:  Procedure Laterality Date  . WISDOM TOOTH EXTRACTION      Current Medications: Current Meds  Medication Sig  . acetaminophen (TYLENOL) 500 MG tablet Take 1,000 mg by mouth every 6 (six) hours as needed for mild pain or headache.  .  clonazePAM (KLONOPIN) 0.25 MG disintegrating tablet Take 1 tablet (0.25 mg total) by mouth 2 (two) times daily as needed for seizure.  . propranolol (INDERAL) 20 MG tablet Take 1 tablet (20 mg total) by mouth 2 (two) times daily.     Allergies:   Hydrocodone   Social History   Socioeconomic History  . Marital status: Single    Spouse name: Not on file  . Number of children: 0  . Years of education: College  . Highest education level: Not on file  Occupational History  . Occupation: [employer]  . Financial resource strain: Not on file  . Food insecurity:    Worry: Not on file    Inability: Not on file  . Transportation needs:    Medical: Not on file    Non-medical: Not on file  Tobacco Use  . Smoking status: Never Smoker  . Smokeless tobacco: Never Used  Substance and Sexual Activity  . Alcohol use: Yes    Frequency: Never    Comment: Seldom  . Drug use: Not Currently    Types: Marijuana    Comment: rare use  . Sexual activity: Yes    Partners: Female  Lifestyle  . Physical activity:  Days per week: Not on file    Minutes per session: Not on file  . Stress: Not on file  Relationships  . Social connections:    Talks on phone: Not on file    Gets together: Not on file    Attends religious service: Not on file    Active member of club or organization: Not on file    Attends meetings of clubs or organizations: Not on file    Relationship status: Not on file  Other Topics Concern  . Not on file  Social History Narrative   Lives alone   Caffeine use: 1 cup coffee per day   Right handed      Family History: The patient's family history includes Diabetes in his father and maternal grandmother; Hypertension in his mother; Migraines in his sister. ROS:   Please see the history of present illness.    All 14 point review of systems negative except as described per history of present illness.  EKGs/Labs/Other Studies Reviewed:    The following  studies were reviewed today:   EKG:  EKG is  ordered today.  The ekg ordered today demonstrates normal sinus rhythm normal P interval normal QS complex duration morphology  Recent Labs: 11/27/2017: TSH 1.02 12/14/2017: ALT 16; BUN 15; Creatinine, Ser 0.78; Hemoglobin 14.7; Platelets 199.0; Potassium 4.1; Sodium 139  Recent Lipid Panel No results found for: CHOL, TRIG, HDL, CHOLHDL, VLDL, LDLCALC, LDLDIRECT  Physical Exam:    VS:  BP 120/70   Pulse 74   Ht 6\' 6"  (1.981 m)   Wt (!) 337 lb (152.9 kg)   SpO2 98%   BMI 38.94 kg/m     Wt Readings from Last 3 Encounters:  12/21/17 (!) 337 lb (152.9 kg)  12/14/17 (!) 338 lb 3.2 oz (153.4 kg)  12/14/17 (!) 338 lb 3.2 oz (153.4 kg)     GEN:  Well nourished, well developed in no acute distress HEENT: Normal NECK: No JVD; No carotid bruits LYMPHATICS: No lymphadenopathy CARDIAC: RRR, no murmurs, no rubs, no gallops RESPIRATORY:  Clear to auscultation without rales, wheezing or rhonchi  ABDOMEN: Soft, non-tender, non-distended MUSCULOSKELETAL:  No edema; No deformity  SKIN: Warm and dry NEUROLOGIC:  Alert and oriented x 3 PSYCHIATRIC:  Normal affect   ASSESSMENT:    1. Anxiety   2. Atypical chest pain    PLAN:    In order of problems listed above:  1. Atypical chest pain in this young gentleman with no significant risk factors for coronary artery disease.  I have very low level suspicion for his pain to being related to his heart however he is so much worry about it on top of that this sensation will generate a lot of anxiety and him to the point that he required some medications did not think it would be reasonable to perform plain EKG treadmill stress test to prove that he does not have any significant coronary artery disease.  He is very happy with this approach.  I will not recommend to start any medications at the moment will wait for results of stress test. 2. Anxiety: He worried about his health a lot which is beneficial to  some degree.  He started already on good diet he is determined to lose some more weight.  I will meet with him again in about a month and we will talk about good diet exercises on the regular basis if stress test is normal.  Again I have very low level  of suspicion that he is pain is related to his heart.   Medication Adjustments/Labs and Tests Ordered: Current medicines are reviewed at length with the patient today.  Concerns regarding medicines are outlined above.  No orders of the defined types were placed in this encounter.  No orders of the defined types were placed in this encounter.   Signed, Park Liter, MD, [facility]. 12/21/2017 8:48 AM    [place]

## 2017-12-21 NOTE — Telephone Encounter (Signed)
Copied from [place] [id number]. Topic: Inquiry >> Dec 21, 2017  4:36 PM Oliver Pila B wrote: Reason for CRM: pt called and states he has  a stress test on the 9th of April but he would like to see if he can get in earlier at another location, call pt to advise

## 2017-12-21 NOTE — Patient Instructions (Signed)
Medication Instructions:  Your physician recommends that you continue on your current medications as directed. Please refer to the Current Medication list given to you today.   Labwork: None  Testing/Procedures: You had an EKG today.   Your physician has requested that you have an exercise tolerance test. For further information please visit [email or web address]. Please also follow instruction sheet, as given.  Follow-Up: Your physician recommends that you schedule a follow-up appointment in: 1 month.  Any Other Special Instructions Will Be Listed Below (If Applicable).     If you need a refill on your cardiac medications before your next appointment, please call your pharmacy.

## 2017-12-22 ENCOUNTER — Encounter: Payer: Self-pay | Admitting: Sports Medicine

## 2017-12-24 NOTE — Telephone Encounter (Signed)
See telephone encounter.

## 2017-12-24 NOTE — Telephone Encounter (Signed)
Reply was sent to patient through [place]

## 2017-12-25 ENCOUNTER — Encounter: Payer: Self-pay | Admitting: Physician Assistant

## 2017-12-25 ENCOUNTER — Encounter: Payer: Self-pay | Admitting: Neurology

## 2017-12-25 ENCOUNTER — Ambulatory Visit
Admission: RE | Admit: 2017-12-25 | Discharge: 2017-12-25 | Disposition: A | Discharge status: Home / Self Care | Payer: 59 | Source: Ambulatory Visit | Attending: Sports Medicine | Admitting: Sports Medicine

## 2017-12-25 DIAGNOSIS — M25562 Pain in left knee: Principal | ICD-10-CM

## 2017-12-25 DIAGNOSIS — G8929 Other chronic pain: Secondary | ICD-10-CM

## 2017-12-26 ENCOUNTER — Other Ambulatory Visit: Payer: Self-pay | Admitting: Physician Assistant

## 2017-12-26 ENCOUNTER — Encounter: Payer: Self-pay | Admitting: Sports Medicine

## 2017-12-26 DIAGNOSIS — F419 Anxiety disorder, unspecified: Secondary | ICD-10-CM

## 2017-12-26 NOTE — Telephone Encounter (Signed)
Request for Clonazepam Last refill on 11/27/17 #10 No CSC No UDS  Please advise

## 2017-12-27 ENCOUNTER — Telehealth (HOSPITAL_COMMUNITY): Payer: Self-pay

## 2017-12-27 ENCOUNTER — Encounter: Payer: Self-pay | Admitting: Physician Assistant

## 2017-12-27 NOTE — Telephone Encounter (Signed)
Encounter complete. 

## 2017-12-28 ENCOUNTER — Ambulatory Visit (HOSPITAL_COMMUNITY)
Admission: RE | Admit: 2017-12-28 | Discharge: 2017-12-28 | Disposition: A | Discharge status: Home / Self Care | Payer: 59 | Source: Ambulatory Visit | Attending: Cardiovascular Disease | Admitting: Cardiovascular Disease

## 2017-12-28 ENCOUNTER — Encounter: Payer: Self-pay | Admitting: Physician Assistant

## 2017-12-28 ENCOUNTER — Encounter: Payer: Self-pay | Admitting: Cardiology

## 2017-12-28 DIAGNOSIS — R0789 Other chest pain: Secondary | ICD-10-CM

## 2017-12-28 LAB — EXERCISE TOLERANCE TEST
CHL RATE OF PERCEIVED EXERTION: 17
CSEPED: 9 min
CSEPEDS: 23 s
CSEPEW: 10.5 METS
MPHR: 198 {beats}/min
Peak HR: 196 {beats}/min
Percent HR: 98 %
Rest HR: 84 {beats}/min

## 2017-12-31 ENCOUNTER — Encounter: Payer: Self-pay | Admitting: Sports Medicine

## 2017-12-31 ENCOUNTER — Encounter (INDEPENDENT_AMBULATORY_CARE_PROVIDER_SITE_OTHER): Payer: Self-pay

## 2017-12-31 ENCOUNTER — Telehealth: Payer: Self-pay | Admitting: Sports Medicine

## 2017-12-31 ENCOUNTER — Telehealth: Payer: Self-pay | Admitting: *Deleted

## 2017-12-31 NOTE — Telephone Encounter (Unsigned)
Copied from [place] [phone]. Topic: Quick Communication - See Telephone Encounter >> Dec 31, 2017  4:26 PM Neva Seat wrote: Pt needing to know what was seen that the radiologist didn't explain and if he could jog a little with out hurting his knee.

## 2017-12-31 NOTE — Telephone Encounter (Signed)
Patient informed of exercise stress test results. Patient verbalized understanding and reminded of follow up appointment on 01/22/2018.

## 2018-01-01 ENCOUNTER — Ambulatory Visit: Payer: 59 | Admitting: Sports Medicine

## 2018-01-01 ENCOUNTER — Encounter: Payer: Self-pay | Admitting: Sports Medicine

## 2018-01-01 VITALS — BP 112/82 | HR 67 | Ht 78.0 in | Wt 343.0 lb

## 2018-01-01 DIAGNOSIS — M25562 Pain in left knee: Secondary | ICD-10-CM

## 2018-01-01 DIAGNOSIS — G8929 Other chronic pain: Secondary | ICD-10-CM | POA: Diagnosis not present

## 2018-01-01 NOTE — Progress Notes (Signed)
Bradley [name], [place] [facility] [place] - 22 y.o. male MRN [id number]  Date of birth: May 28, 1996  Visit Date: 01/01/2018  PCP: Brunetta Jeans, PA-C   Referred by: Brunetta Jeans, PA-C  Scribe for today's visit: Josepha Pigg, CMA     SUBJECTIVE:  Bradley [name] is here for Follow-up (L knee )  12/14/2017: His L knee pain symptoms INITIALLY: Began at age 36 after a fall while running. More recently he has been playing football and noticed an increase in pain, he was seen at urgent care 08/29/17. Described as moderate (6/10) soreness/tenderness, nonradiating Worsened with weight bearing.  Improved with rest.  Additional associated symptoms include: He has noticed clicking and popping in the knee. He denies swelling around the knee. Pain is mostly just under the patella and/or posterior knee. Pain comes and goes and seems to occur randomly.    At this time symptoms are improving compared to onset. For the first 8 months after the initial injury he had pain for 8 months but it has been getting better over time with occasional flare-up.  He finds that sitting in a hot tub helps with the pain. He has tried NSAIDs in the past with minimal relief. He has tried wearing a knee brace in the past with minimal relief. He has also tried applying topical [facility] and gets temporary relief.    Had XR of L knee 08/2017 at [facility] on [facility] - was told it was normal but does not have a copy of XR or report with him today.   01/01/2018: Compared to the last office visit, his previously described symptoms show no change. Pain has been off and on aching. Pain is medial.  Current symptoms are moderate & are nonradiating He has been taking tylenol prn, wearing knee brace, and using Icy hot with some relief.   ROS Denies night time disturbances. Denies fevers, chills, or night sweats. Denies unexplained weight  loss. Denies personal history of cancer. Denies changes in bowel or bladder habits. Denies recent unreported falls. Denies new or worsening dyspnea or wheezing. Denies headaches or dizziness.  Denies numbness, tingling or weakness  In the extremities.  Denies dizziness or presyncopal episodes Denies lower extremity edema    HISTORY & PERTINENT PRIOR DATA:  Prior History reviewed and updated per electronic medical record.  Significant/pertinent history, findings, studies include:  reports that he has never smoked. He has never used smokeless tobacco. No results for input(s): HGBA1C, LABURIC, CREATINE in the last 8760 hours. No specialty comments available. No problems updated.  OBJECTIVE:  VS:  HT:6\' 6"  (198.1 cm)   WT:(Abnormal) 343 lb (155.6 kg)  BMI:39.65    BP:112/82  HR:67bpm  TEMP: ( )  RESP:96 %   PHYSICAL EXAM: Constitutional: WDWN, Non-toxic appearing. Psychiatric: Alert & appropriately interactive.  Not depressed or anxious appearing. Respiratory: No increased work of breathing.  Trachea Midline Eyes: Pupils are equal.  EOM intact without nystagmus.  No scleral icterus  Vascular Exam: warm to touch no edema  lower extremity neuro exam: unremarkable  MSK Exam: Bilateral knees show significant poor motor control but overall no significant limited laxity comparing varus and valgus strain bilaterally.  He does have hypermobility of anterior posterior drawer testing on the left compared to the right.  He does have increased laxity with dial testing on the left compared to the right.   ASSESSMENT & PLAN:  1. Chronic pain of left knee     PLAN: This difficult situation.  He may have an underlying posterior lateral corner injury but I like to see how he does with formal physical therapy given the ongoing issues that he has with both knees.  He does have a small PCL ganglion and this may be reflective of a prior PCL injury that is partially healed which would suggest  possibly a posterior lateral corner injury.  If any lack of improvement formal referral to Dr. Alphonzo Severance or Dr. Moshe Salisbury will be recommended.   Follow-up: Return in about 6 weeks (around 02/12/2018).   >50% of this 25 minute visit spent in direct patient counseling and/or coordination of care.  Discussion was focused on education regarding the in discussing the pathoetiology and anticipated clinical course of the above condition.     Please see additional documentation for Objective, Assessment and Plan sections. Pertinent additional documentation may be included in corresponding procedure notes, imaging studies, problem based documentation and patient instructions. Please see these sections of the encounter for additional information regarding this visit.  CMA/ATC served as Education administrator during this visit. History, Physical, and Plan performed by medical provider. Documentation and orders reviewed and attested to.      Gerda Diss, [place] [facility] Physician

## 2018-01-01 NOTE — Patient Instructions (Signed)
I recommend you obtained a compression sleeve to help with your joint problems. There are many options on the market however I recommend obtaining a L knee Body Helix compression sleeve.  You can find information (including how to appropriate measure yourself for sizing) can be found at www.Body http://www.lambert.com/.  Many of these products are health savings account (HSA) eligible.   You can use the compression sleeve at any time throughout the day but is most important to use while being active as well as for 2 hours post-activity.   It is appropriate to ice following activity with the compression sleeve in place.

## 2018-01-02 ENCOUNTER — Other Ambulatory Visit: Payer: Self-pay

## 2018-01-02 ENCOUNTER — Encounter: Payer: Self-pay | Admitting: Physician Assistant

## 2018-01-02 ENCOUNTER — Ambulatory Visit: Payer: 59 | Admitting: Physician Assistant

## 2018-01-02 VITALS — BP 110/70 | HR 68 | Temp 98.1°F | Resp 16 | Ht 77.0 in | Wt 342.0 lb

## 2018-01-02 DIAGNOSIS — L729 Follicular cyst of the skin and subcutaneous tissue, unspecified: Secondary | ICD-10-CM | POA: Diagnosis not present

## 2018-01-02 NOTE — Patient Instructions (Signed)
Please keep skin clean and dry. I am sending in the fluid specimen to the lab. You will be contacted for assessment with Dermatology. Call or message me if you note any new symptoms.

## 2018-01-02 NOTE — Progress Notes (Signed)
Patient presents to clinic today c/o bump on the underside of scrotum that has been present off and on over the past couple of months. Notes occasional drainage from the area. Denies pain, redness, tenderness or warmth. Denies fever, chills, malaise or fatigue.   Past Medical History:  Diagnosis Date  . Anxiety   . Chronic headaches   . Clavicular fracture   . History of chickenpox   . Shingles    22 years old.    Current Outpatient Medications on File Prior to Visit  Medication Sig Dispense Refill  . acetaminophen (TYLENOL) 500 MG tablet Take 1,000 mg by mouth every 6 (six) hours as needed for mild pain or headache.    . clonazePAM (KLONOPIN) 0.25 MG disintegrating tablet TAKE 1 TABLET BY MOUTH 2 TIMES A DAY AS NEEDED FOR SEIZURE 10 tablet 0  . FLUoxetine (PROZAC) 10 MG tablet Take 10 mg by mouth daily.    . propranolol (INDERAL) 20 MG tablet Take 1 tablet (20 mg total) by mouth 2 (two) times daily. 60 tablet 3   No current facility-administered medications on file prior to visit.     Allergies  Allergen Reactions  . Hydrocodone Itching    Pt states he feels itchy after taking, but it doesn't preclude him taking hydrocodone if needed    Family History  Problem Relation Age of Onset  . Hypertension Mother   . Diabetes Father        type II  . Migraines Sister   . Diabetes Maternal Grandmother     Social History   Socioeconomic History  . Marital status: Single    Spouse name: Not on file  . Number of children: 0  . Years of education: College  . Highest education level: Not on file  Occupational History  . Occupation: [employer]  . Financial resource strain: Not on file  . Food insecurity:    Worry: Not on file    Inability: Not on file  . Transportation needs:    Medical: Not on file    Non-medical: Not on file  Tobacco Use  . Smoking status: Never Smoker  . Smokeless tobacco: Never Used  Substance and Sexual Activity  . Alcohol use: Yes     Frequency: Never    Comment: Seldom  . Drug use: Not Currently    Types: Marijuana    Comment: rare use  . Sexual activity: Yes    Partners: Female  Lifestyle  . Physical activity:    Days per week: Not on file    Minutes per session: Not on file  . Stress: Not on file  Relationships  . Social connections:    Talks on phone: Not on file    Gets together: Not on file    Attends religious service: Not on file    Active member of club or organization: Not on file    Attends meetings of clubs or organizations: Not on file    Relationship status: Not on file  Other Topics Concern  . Not on file  Social History Narrative   Lives alone   Caffeine use: 1 cup coffee per day   Right handed    Review of Systems - See HPI.  All other ROS are negative.  BP 110/70   Pulse 68   Temp 98.1 F (36.7 C) (Oral)   Resp 16   Ht 6' 5"  (1.956 m)   Wt (!) 342 lb (155.1 kg)  SpO2 98%   BMI 40.56 kg/m   Physical Exam  Constitutional: He appears well-developed and well-nourished.  HENT:  Head: Normocephalic and atraumatic.  Eyes: Conjunctivae are normal.  Cardiovascular: Normal rate.  Pulmonary/Chest: Effort normal.  Genitourinary: Testes normal and penis normal.     Skin: Skin is warm and dry.  Vitals reviewed.   Recent Results (from the past 2160 hour(s))  TSH     Status: None   Collection Time: 11/27/17  3:51 PM  Result Value Ref Range   TSH 1.02 0.35 - 4.50 uIU/mL  POCT urinalysis dipstick     Status: Normal   Collection Time: 12/05/17  1:51 PM  Result Value Ref Range   Color, UA yellow    Clarity, UA clear    Glucose, UA negative    Bilirubin, UA negative    Ketones, UA negative    Spec Grav, UA 1.025 1.010 - 1.025   Blood, UA negative    pH, UA 5.0 5.0 - 8.0   Protein, UA negative    Urobilinogen, UA 0.2 0.2 or 1.0 E.U./dL   Nitrite, UA negative    Leukocytes, UA Negative Negative   Appearance     Odor    Basic metabolic panel     Status: None   Collection  Time: 12/08/17  2:23 PM  Result Value Ref Range   Sodium 139 135 - 145 mmol/L   Potassium 4.3 3.5 - 5.1 mmol/L   Chloride 104 101 - 111 mmol/L   CO2 27 22 - 32 mmol/L   Glucose, Bld 95 65 - 99 mg/dL   BUN 10 6 - 20 mg/dL   Creatinine, Ser 0.76 0.61 - 1.24 mg/dL   Calcium 9.6 8.9 - 10.3 mg/dL   GFR calc non Af Amer >60 >60 mL/min   GFR calc Af Amer >60 >60 mL/min    Comment: (NOTE) The eGFR has been calculated using the CKD EPI equation. This calculation has not been validated in all clinical situations. eGFR's persistently <60 mL/min signify possible Chronic Kidney Disease.    Anion gap 8 5 - 15    Comment: Performed at [facility], [place] [address], [place], [place] [address]  CBC     Status: None   Collection Time: 12/08/17  2:23 PM  Result Value Ref Range   WBC 7.6 4.0 - 10.5 K/uL   RBC 5.04 4.22 - 5.81 MIL/uL   Hemoglobin 15.4 13.0 - 17.0 g/dL   HCT 46.6 39.0 - 52.0 %   MCV 92.5 78.0 - 100.0 fL   MCH 30.6 26.0 - 34.0 pg   MCHC 33.0 30.0 - 36.0 g/dL   RDW 12.6 11.5 - 15.5 %   Platelets 219 150 - 400 K/uL    Comment: Performed at [facility], [place] [address], [place], [place] [address]  D-dimer, quantitative (not at [facility])     Status: None   Collection Time: 12/08/17  2:23 PM  Result Value Ref Range   D-Dimer, Quant <0.27 0.00 - 0.50 ug/mL-FEU    Comment: (NOTE) At the manufacturer cut-off of 0.50 ug/mL FEU, this assay has been documented to exclude PE with a sensitivity and negative predictive value of 97 to 99%.  At this time, this assay has not been approved by the FDA to exclude DVT/VTE. Results should be correlated with clinical presentation. Performed at [facility], [place] [address], [place], [place] [address]   I-stat troponin, ED     Status:  None   Collection Time: 12/08/17  2:36 PM  Result Value Ref Range   Troponin i, poc 0.01 0.00 - 0.08 ng/mL   Comment 3            Comment: Due to the release  kinetics of cTnI, a negative result within the first hours of the onset of symptoms does not rule out myocardial infarction with certainty. If myocardial infarction is still suspected, repeat the test at appropriate intervals.   CBC w/Diff     Status: None   Collection Time: 12/14/17  2:48 PM  Result Value Ref Range   WBC 7.5 4.0 - 10.5 K/uL   RBC 4.80 4.22 - 5.81 Mil/uL   Hemoglobin 14.7 13.0 - 17.0 g/dL   HCT 43.2 39.0 - 52.0 %   MCV 90.0 78.0 - 100.0 fl   MCHC 33.9 30.0 - 36.0 g/dL   RDW 12.9 11.5 - 15.5 %   Platelets 199.0 150.0 - 400.0 K/uL   Neutrophils Relative % 64.0 43.0 - 77.0 %   Lymphocytes Relative 27.2 12.0 - 46.0 %   Monocytes Relative 6.0 3.0 - 12.0 %   Eosinophils Relative 2.0 0.0 - 5.0 %   Basophils Relative 0.8 0.0 - 3.0 %   Neutro Abs 4.8 1.4 - 7.7 K/uL   Lymphs Abs 2.0 0.7 - 4.0 K/uL   Monocytes Absolute 0.4 0.1 - 1.0 K/uL   Eosinophils Absolute 0.1 0.0 - 0.7 K/uL   Basophils Absolute 0.1 0.0 - 0.1 K/uL  Comp Met (CMET)     Status: Abnormal   Collection Time: 12/14/17  2:48 PM  Result Value Ref Range   Sodium 139 135 - 145 mEq/L   Potassium 4.1 3.5 - 5.1 mEq/L   Chloride 105 96 - 112 mEq/L   CO2 24 19 - 32 mEq/L   Glucose, Bld 103 (H) 70 - 99 mg/dL   BUN 15 6 - 23 mg/dL   Creatinine, Ser 0.78 0.40 - 1.50 mg/dL   Total Bilirubin 0.5 0.2 - 1.2 mg/dL   Alkaline Phosphatase 60 39 - 117 U/L   AST 12 0 - 37 U/L   ALT 16 0 - 53 U/L   Total Protein 6.9 6.0 - 8.3 g/dL   Albumin 4.5 3.5 - 5.2 g/dL   Calcium 9.3 8.4 - 10.5 mg/dL   GFR 131.72 >60.00 mL/min  Lipase     Status: None   Collection Time: 12/14/17  2:48 PM  Result Value Ref Range   Lipase 18.0 Repeated and verified X2. 11.0 - 59.0 U/L  Exercise Tolerance Test     Status: None   Collection Time: 12/28/17  4:07 PM  Result Value Ref Range   Rest HR 84 bpm   Rest BP 145/64 mmHg   RPE 17    Exercise duration (sec) 23 sec   Percent HR 98 %   Exercise duration (min) 9 min   Estimated workload  10.5 METS   Peak HR 196 bpm   Peak BP 185/47 mmHg   MPHR 198 bpm    Assessment/Plan: 1. Scrotal cyst Raphe cyst. No tenderness. Some drainage noted, will assess for infection. Supportive measures reviewed. Referral to dermatology placed for removal.  - Wound culture - Ambulatory referral to Dermatology   Leeanne Rio, PA-C

## 2018-01-07 ENCOUNTER — Telehealth: Payer: Self-pay | Admitting: Physician Assistant

## 2018-01-07 ENCOUNTER — Ambulatory Visit: Payer: 59

## 2018-01-07 ENCOUNTER — Encounter: Payer: Self-pay | Admitting: Physician Assistant

## 2018-01-07 NOTE — Telephone Encounter (Signed)
Responded to patient via mychart

## 2018-01-07 NOTE — Telephone Encounter (Signed)
We do not give allergy injections here. He will need to get from the allergist I have referred him to.

## 2018-01-07 NOTE — Telephone Encounter (Signed)
Copied from [place] [id number]. Topic: Quick Communication - See Telephone Encounter >> Jan 07, 2018  2:59 PM Synthia Innocent wrote: CRM for notification. See Telephone encounter for: 01/07/18. Patient calling to see if provider provides any allergy injections, or need a referral? Seasonal allergies. Please advise

## 2018-01-09 ENCOUNTER — Ambulatory Visit: Payer: 59 | Admitting: Physical Therapy

## 2018-01-09 DIAGNOSIS — M25562 Pain in left knee: Secondary | ICD-10-CM | POA: Diagnosis not present

## 2018-01-09 DIAGNOSIS — G8929 Other chronic pain: Secondary | ICD-10-CM | POA: Diagnosis not present

## 2018-01-09 NOTE — Therapy (Signed)
[name] [address] [place], [place], [address] Phone: [phone]   Fax:  [phone]  Physical Therapy Evaluation  Patient Details  Name: Bradley [name] MRN: [id number] Date of Birth: Aug 09, 1996 Referring Provider: Teresa Coombs   Encounter Date: 01/09/2018  PT End of Session - 01/09/18 1557    Visit Number  1    Number of Visits  12    PT Start Time  0350    PT Stop Time  1627    PT Time Calculation (min)  42 min    Activity Tolerance  Patient tolerated treatment well    Behavior During Therapy  [facility] for tasks assessed/performed       Past Medical History:  Diagnosis Date  . Anxiety   . Chronic headaches   . Clavicular fracture   . History of chickenpox   . Shingles    22 years old.    Past Surgical History:  Procedure Laterality Date  . WISDOM TOOTH EXTRACTION      There were no vitals filed for this visit.   Subjective Assessment - 01/09/18 1549    Subjective  Pt had hyperextension injury when he was 14. He did have pain for quite  some time, and took a very long time to get better. He recently has had increased pain. No new injury. He wants to get knee stronger. He works from home, computer, but does also does a lot of walking/moving. He has been wearing Body Helix brace.  Pt taking Klonopin.listed on med list as for Seizure, pt with no history of seizures, is taking for Anxiety.    Limitations  Lifting;Standing;Walking;House hold activities    Patient Stated Goals  Strength, decreased pain.     Currently in Pain?  Yes    Pain Score  4     Pain Location  Knee    Pain Orientation  Left    Pain Descriptors / Indicators  Aching    Pain Type  Chronic pain    Pain Onset  More than a month ago    Pain Frequency  Intermittent    Aggravating Factors   Increased standing/walking         [facility] PT Assessment - 01/09/18 0001      Assessment   Medical Diagnosis  L knee pain     Referring Provider  Teresa Coombs     Prior Therapy  No      Precautions   Precautions  None      Balance Screen   Has the patient fallen in the past 6 months  No      Prior Function   Level of Independence  Independent      Cognition   Overall Cognitive Status  Within Functional Limits for tasks assessed      Posture/Postural Control   Posture Comments  Poor foot posture, increased pronation bilaterally      ROM / Strength   AROM / PROM / Strength  AROM;Strength      AROM   Overall AROM Comments  wnl      Strength   Overall Strength Comments  L knee flex: 4/5; ext: 4-/5;  L hip : 4-/5       Palpation   Palpation comment  Mild soreness with deep palpation of posterior knee;  hypermobile patella;  Dynamic balance/stability rated as fair;  Sigificant sway with SLS, and unstable surface  Objective measurements completed on examination: See above findings.      [facility] Adult PT Treatment/Exercise - 01/09/18 0001      Exercises   Exercises  Knee/Hip      Knee/Hip Exercises: Stretches   Active Hamstring Stretch  3 reps;30 seconds      Knee/Hip Exercises: Seated   Long Arc Quad  10 reps      Knee/Hip Exercises: Supine   Quad Sets  10 reps    Straight Leg Raises  10 reps      Knee/Hip Exercises: Sidelying   Hip ABduction  10 reps             PT Education - 01/09/18 1630    Education provided  Yes    Education Details  Initial HEP    Person(s) Educated  Patient    Methods  Explanation;Handout;Demonstration    Comprehension  Verbalized understanding;Need further instruction       PT Short Term Goals - 01/09/18 1631      PT SHORT TERM GOAL #1   Title  Pt to be independent with initial HEP     Time  2    Period  Weeks    Status  New    Target Date  01/23/18        PT Long Term Goals - 01/09/18 1632      PT LONG TERM GOAL #1   Title  Pt to be independent with long term HEP for strengthening    Time  6    Period  Weeks    Status  New    Target Date   02/20/18      PT LONG TERM GOAL #2   Title  Pt to report 0-2/10 pain in knee with activity     Time  6    Period  Weeks    Status  New    Target Date  02/20/18      PT LONG TERM GOAL #3   Title  Pt to demo dynamic stability of L knee, to be WNL for pt age.    Time  6    Period  Weeks    Status  New    Target Date  02/20/18      PT LONG TERM GOAL #4   Title  Pt to demo increased strength of L hip and knee, to be at least 4+/5 to improve stability and pain.     Time  6    Period  Weeks    Status  New    Target Date  02/20/18             Plan - 01/09/18 1633    Clinical Impression Statement  Pt presents with primary complaint of increased pain in L knee. Pt with minimal findings on MRI. He has hyperextension in Bilateral knees, and mild increase in instability in L vs R. He has poor strength of L knee, quad and hip . He also has poor stability for dynamic activity and SLS on L. Pt with lack of effective HEP, and will benefit from education on this. He has pain that is intermittent with increased activity, likely caused by weakness and instability. Pt also with noted poor foot posture and alignment, may benefit from footwear recommendation and/or orthotics to improve foot alignement, knee alignment, and pain.  Pt with significant pronation, that is likely increasing compression at medial knee. Pt with decreased ability for full functional activiites, due to pain and deficits. He  will benefit from skilled PT to improve.     Clinical Presentation  Stable    Clinical Decision Making  Low    Rehab Potential  Good    PT Frequency  1x / week    PT Duration  6 weeks    PT Treatment/Interventions  ADLs/Self Care Home Management;Cryotherapy;Electrical Stimulation;Iontophoresis 4mg /ml Dexamethasone;Functional mobility training;Stair training;Gait training;Ultrasound;Moist Heat;Therapeutic activities;Therapeutic exercise;Balance training;Neuromuscular re-education;Patient/family  education;Orthotic Fit/Training;Passive range of motion;Manual techniques;Dry needling;Taping;Vasopneumatic Device    Consulted and Agree with Plan of Care  Patient       Patient will benefit from skilled therapeutic intervention in order to improve the following deficits and impairments:  Abnormal gait, Decreased strength, Pain, Decreased balance  Visit Diagnosis: Chronic pain of left knee     Problem List Patient Active Problem List   Diagnosis Date Noted  . Atypical chest pain 12/21/2017  . Generalized abdominal pain 12/17/2017  . Anxiety 11/28/2017   Lyndee Hensen, PT, DPT 4:41 PM  01/09/18    [facility] [place], [place], [address] Phone: [phone]   Fax:  [phone]  Name: Bradley [name] MRN: [id number] Date of Birth: Feb 20, 1996

## 2018-01-15 ENCOUNTER — Other Ambulatory Visit: Payer: Self-pay | Admitting: *Deleted

## 2018-01-15 DIAGNOSIS — J302 Other seasonal allergic rhinitis: Secondary | ICD-10-CM

## 2018-01-16 ENCOUNTER — Encounter: Payer: Self-pay | Admitting: Physician Assistant

## 2018-01-16 ENCOUNTER — Other Ambulatory Visit: Payer: Self-pay

## 2018-01-16 ENCOUNTER — Ambulatory Visit: Payer: 59 | Admitting: Physician Assistant

## 2018-01-16 VITALS — BP 120/78 | HR 64 | Temp 98.8°F | Resp 16 | Ht 77.0 in | Wt 347.0 lb

## 2018-01-16 DIAGNOSIS — F419 Anxiety disorder, unspecified: Secondary | ICD-10-CM

## 2018-01-16 DIAGNOSIS — J302 Other seasonal allergic rhinitis: Secondary | ICD-10-CM | POA: Diagnosis not present

## 2018-01-16 MED ORDER — FEXOFENADINE-PSEUDOEPHED ER 180-240 MG PO TB24: 1.0000 | ORAL_TABLET | Freq: Every day | ORAL | 3 refills | Status: DC

## 2018-01-16 NOTE — Patient Instructions (Signed)
Please keep well-hydrated and get plenty of rest.  Continue the Flonase.  Start Allegra-D daily to help with congestion and allergies.  Start a saline nasal rinse 1-2 x daily. Put a humidifier in the bedroom.  Follow-up with your allergist as scheduled.   Tonight, take 1/2 tablet of the Fluoxetine. Take 1/2 tablet every other day for 1 week before completely stopping.  Keep up with the Klonopin PRN.

## 2018-01-16 NOTE — Progress Notes (Signed)
Patient presents to clinic today to discuss multiple issues.   Patient endorses 3 weeks of rhinorrhea, nasal congestion. Some itchy eyes. Denies chest congestion, cough, fever, chills, malaise or fatigue. Is taking Flonase which helps some. Has tried Claritin and Zyrtec with no relief. Benadryl helps but makes him too sleepy.  Patient would also like to discuss wean from Fluoxetine. Notes it is causing vivid dreams and change in sex drive. States he feels that he does not want to try other medications presently.   Past Medical History:  Diagnosis Date  . Anxiety   . Chronic headaches   . Clavicular fracture   . History of chickenpox   . Shingles    22 years old.    Current Outpatient Medications on File Prior to Visit  Medication Sig Dispense Refill  . acetaminophen (TYLENOL) 500 MG tablet Take 1,000 mg by mouth every 6 (six) hours as needed for mild pain or headache.    . clonazePAM (KLONOPIN) 0.25 MG disintegrating tablet TAKE 1 TABLET BY MOUTH 2 TIMES A DAY AS NEEDED FOR SEIZURE 10 tablet 0  . FLUoxetine (PROZAC) 10 MG tablet Take 10 mg by mouth daily.    . fluticasone (FLONASE) 50 MCG/ACT nasal spray Place 2 sprays into both nostrils daily.    . propranolol (INDERAL) 20 MG tablet Take 1 tablet (20 mg total) by mouth 2 (two) times daily. 60 tablet 3   No current facility-administered medications on file prior to visit.     Allergies  Allergen Reactions  . Hydrocodone Itching    Pt states he feels itchy after taking, but it doesn't preclude him taking hydrocodone if needed    Family History  Problem Relation Age of Onset  . Hypertension Mother   . Diabetes Father        type II  . Migraines Sister   . Diabetes Maternal Grandmother     Social History   Socioeconomic History  . Marital status: Single    Spouse name: Not on file  . Number of children: 0  . Years of education: College  . Highest education level: Not on file  Occupational History  . Occupation:  [employer]  . Financial resource strain: Not on file  . Food insecurity:    Worry: Not on file    Inability: Not on file  . Transportation needs:    Medical: Not on file    Non-medical: Not on file  Tobacco Use  . Smoking status: Never Smoker  . Smokeless tobacco: Never Used  Substance and Sexual Activity  . Alcohol use: Yes    Frequency: Never    Comment: Seldom  . Drug use: Not Currently    Types: Marijuana    Comment: rare use  . Sexual activity: Yes    Partners: Female  Lifestyle  . Physical activity:    Days per week: Not on file    Minutes per session: Not on file  . Stress: Not on file  Relationships  . Social connections:    Talks on phone: Not on file    Gets together: Not on file    Attends religious service: Not on file    Active member of club or organization: Not on file    Attends meetings of clubs or organizations: Not on file    Relationship status: Not on file  Other Topics Concern  . Not on file  Social History Narrative   Lives alone   Caffeine use:  1 cup coffee per day   Right handed    Review of Systems - See HPI.  All other ROS are negative.  BP 120/78   Pulse 64   Temp 98.8 F (37.1 C) (Oral)   Resp 16   Ht _0  (1.956 m)   Wt (!) 347 lb (157.4 kg)   SpO2 98%   BMI 41.15 kg/m   Physical Exam  Constitutional: He appears well-developed and well-nourished. No distress.  HENT:  Head: Normocephalic and atraumatic.  Right Ear: Tympanic membrane, external ear and ear canal normal.  Left Ear: Tympanic membrane, external ear and ear canal normal.  Nose: Nose normal.  Mouth/Throat: Oropharynx is clear and moist and mucous membranes are normal. No posterior oropharyngeal edema or posterior oropharyngeal erythema.  Eyes: Pupils are equal, round, and reactive to light. Conjunctivae are normal.  Neck: Neck supple. No thyromegaly present.  Cardiovascular: Normal rate, regular rhythm, normal heart sounds and intact distal pulses.   Pulmonary/Chest: Effort normal and breath sounds normal. No respiratory distress. He has no wheezes. He has no rales. He exhibits no tenderness.  Lymphadenopathy:    He has no cervical adenopathy.  Skin: Skin is warm and dry. No rash noted. He is not diaphoretic.  Psychiatric: He has a normal mood and affect.  Vitals reviewed.   Recent Results (from the past 2160 hour(s))  TSH     Status: None   Collection Time: 11/27/17  3:51 PM  Result Value Ref Range   TSH 1.02 0.35 - 4.50 uIU/mL  POCT urinalysis dipstick     Status: Normal   Collection Time: 12/05/17  1:51 PM  Result Value Ref Range   Color, UA yellow    Clarity, UA clear    Glucose, UA negative    Bilirubin, UA negative    Ketones, UA negative    Spec Grav, UA 1.025 1.010 - 1.025   Blood, UA negative    pH, UA 5.0 5.0 - 8.0   Protein, UA negative    Urobilinogen, UA 0.2 0.2 or 1.0 E.U./dL   Nitrite, UA negative    Leukocytes, UA Negative Negative   Appearance     Odor    Basic metabolic panel     Status: None   Collection Time: 12/08/17  2:23 PM  Result Value Ref Range   Sodium 139 135 - 145 mmol/L   Potassium 4.3 3.5 - 5.1 mmol/L   Chloride 104 101 - 111 mmol/L   CO2 27 22 - 32 mmol/L   Glucose, Bld 95 65 - 99 mg/dL   BUN 10 6 - 20 mg/dL   Creatinine, Ser 0.76 0.61 - 1.24 mg/dL   Calcium 9.6 8.9 - 10.3 mg/dL   GFR calc non Af Amer >60 >60 mL/min   GFR calc Af Amer >60 >60 mL/min    Comment: (NOTE) The eGFR has been calculated using the CKD EPI equation. This calculation has not been validated in all clinical situations. eGFR's persistently <60 mL/min signify possible Chronic Kidney Disease.    Anion gap 8 5 - 15    Comment: Performed at [facility], [place] [address], [place], [place] [address]  CBC     Status: None   Collection Time: 12/08/17  2:23 PM  Result Value Ref Range   WBC 7.6 4.0 - 10.5 K/uL   RBC 5.04 4.22 - 5.81 MIL/uL   Hemoglobin 15.4 13.0 - 17.0 g/dL   HCT 46.6 39.0 -  52.0 %  MCV 92.5 78.0 - 100.0 fL   MCH 30.6 26.0 - 34.0 pg   MCHC 33.0 30.0 - 36.0 g/dL   RDW 12.6 11.5 - 15.5 %   Platelets 219 150 - 400 K/uL    Comment: Performed at [facility], [place] [address] [place], [place] [address]  D-dimer, quantitative (not at [facility])     Status: None   Collection Time: 12/08/17  2:23 PM  Result Value Ref Range   D-Dimer, Quant <0.27 0.00 - 0.50 ug/mL-FEU    Comment: (NOTE) At the manufacturer cut-off of 0.50 ug/mL FEU, this assay has been documented to exclude PE with a sensitivity and negative predictive value of 97 to 99%.  At this time, this assay has not been approved by the FDA to exclude DVT/VTE. Results should be correlated with clinical presentation. Performed at [facility], [place] [address] [place], [place] [address]   I-stat troponin, ED     Status: None   Collection Time: 12/08/17  2:36 PM  Result Value Ref Range   Troponin i, poc 0.01 0.00 - 0.08 ng/mL   Comment 3            Comment: Due to the release kinetics of cTnI, a negative result within the first hours of the onset of symptoms does not rule out myocardial infarction with certainty. If myocardial infarction is still suspected, repeat the test at appropriate intervals.   CBC w/Diff     Status: None   Collection Time: 12/14/17  2:48 PM  Result Value Ref Range   WBC 7.5 4.0 - 10.5 K/uL   RBC 4.80 4.22 - 5.81 Mil/uL   Hemoglobin 14.7 13.0 - 17.0 g/dL   HCT 43.2 39.0 - 52.0 %   MCV 90.0 78.0 - 100.0 fl   MCHC 33.9 30.0 - 36.0 g/dL   RDW 12.9 11.5 - 15.5 %   Platelets 199.0 150.0 - 400.0 K/uL   Neutrophils Relative % 64.0 43.0 - 77.0 %   Lymphocytes Relative 27.2 12.0 - 46.0 %   Monocytes Relative 6.0 3.0 - 12.0 %   Eosinophils Relative 2.0 0.0 - 5.0 %   Basophils Relative 0.8 0.0 - 3.0 %   Neutro Abs 4.8 1.4 - 7.7 K/uL   Lymphs Abs 2.0 0.7 - 4.0 K/uL   Monocytes Absolute 0.4 0.1 - 1.0 K/uL   Eosinophils Absolute 0.1 0.0 - 0.7 K/uL    Basophils Absolute 0.1 0.0 - 0.1 K/uL  Comp Met (CMET)     Status: Abnormal   Collection Time: 12/14/17  2:48 PM  Result Value Ref Range   Sodium 139 135 - 145 mEq/L   Potassium 4.1 3.5 - 5.1 mEq/L   Chloride 105 96 - 112 mEq/L   CO2 24 19 - 32 mEq/L   Glucose, Bld 103 (H) 70 - 99 mg/dL   BUN 15 6 - 23 mg/dL   Creatinine, Ser 0.78 0.40 - 1.50 mg/dL   Total Bilirubin 0.5 0.2 - 1.2 mg/dL   Alkaline Phosphatase 60 39 - 117 U/L   AST 12 0 - 37 U/L   ALT 16 0 - 53 U/L   Total Protein 6.9 6.0 - 8.3 g/dL   Albumin 4.5 3.5 - 5.2 g/dL   Calcium 9.3 8.4 - 10.5 mg/dL   GFR 131.72 >60.00 mL/min  Lipase     Status: None   Collection Time: 12/14/17  2:48 PM  Result Value Ref Range   Lipase 18.0 Repeated and verified  X2. 11.0 - 59.0 U/L  Exercise Tolerance Test     Status: None   Collection Time: 12/28/17  4:07 PM  Result Value Ref Range   Rest HR 84 bpm   Rest BP 145/64 mmHg   RPE 17    Exercise duration (sec) 23 sec   Percent HR 98 %   Exercise duration (min) 9 min   Estimated workload 10.5 METS   Peak HR 196 bpm   Peak BP 185/47 mmHg   MPHR 198 bpm    Assessment/Plan: 1. Seasonal allergic rhinitis, unspecified trigger Continue Flonase. Start saline nasal rinse. Start Allegra-D. Follow-up with allergist as scheduled.  - fexofenadine-pseudoephedrine (ALLEGRA-D 24) 180-240 MG 24 hr tablet; Take 1 tablet by mouth daily.  Dispense: 30 tablet; Refill: 3  2. Anxiety Will decrease Fluoxetine to 1/2 tablet QOD x 1 week before stopping. Continue Klonopin PRN. Follow-up as needed.   Leeanne Rio, PA-C

## 2018-01-17 ENCOUNTER — Encounter: Payer: Self-pay | Admitting: Physical Therapy

## 2018-01-17 ENCOUNTER — Ambulatory Visit: Payer: 59 | Admitting: Physical Therapy

## 2018-01-17 VITALS — Temp 98.1°F

## 2018-01-17 DIAGNOSIS — G8929 Other chronic pain: Secondary | ICD-10-CM

## 2018-01-17 DIAGNOSIS — M25562 Pain in left knee: Secondary | ICD-10-CM

## 2018-01-17 NOTE — Therapy (Signed)
[facility] [address] [place], [place], [address] Phone: [phone]   Fax:  [phone]  Physical Therapy Treatment  Patient Details  Name: Bradley [name] MRN: [id number] Date of Birth: 1996/02/23 Referring Provider: Teresa Coombs   Encounter Date: 01/17/2018  PT End of Session - 01/17/18 1148    Visit Number  2    Number of Visits  12    PT Start Time  1045    PT Stop Time  1125    PT Time Calculation (min)  40 min    Activity Tolerance  Patient tolerated treatment well    Behavior During Therapy  [facility] for tasks assessed/performed       Past Medical History:  Diagnosis Date  . Anxiety   . Chronic headaches   . Clavicular fracture   . History of chickenpox   . Shingles    22 years old.    Past Surgical History:  Procedure Laterality Date  . WISDOM TOOTH EXTRACTION      Vitals:   01/17/18 1146  Temp: 98.1 F (36.7 C)    Subjective Assessment - 01/17/18 1147    Subjective  Pt states he has been doing HEP, knee has felt a bit better. He went to MD yesterday bc of upper respiratory symptoms, was put on allergy med. Pt requests temperature to be taken today, is WNL.     Currently in Pain?  No/denies    Pain Score  0-No pain                       [facility] Adult PT Treatment/Exercise - 01/17/18 1050      Posture/Postural Control   Posture Comments  Poor foot posture, increased pronation bilaterally      Exercises   Exercises  Knee/Hip      Knee/Hip Exercises: Stretches   Active Hamstring Stretch  3 reps;30 seconds      Knee/Hip Exercises: Aerobic   Stationary Bike  L1 x 8 min      Knee/Hip Exercises: Standing   Hip Abduction  2 sets;10 reps;Both    Functional Squat  15 reps    Functional Squat Limitations  with education for proper form    SLS  30 sec x 2 bil      Knee/Hip Exercises: Seated   Long Arc Quad  20 reps;Both    Long Arc Quad Weight  2 lbs.      Knee/Hip Exercises: Supine   Quad Sets  --    Peschke  20 reps    Straight Leg Raises  20 reps      Knee/Hip Exercises: Sidelying   Hip ABduction  10 reps;2 sets             PT Education - 01/17/18 1148    Education provided  Yes    Education Details  HEP, Footwear recommendation     Person(s) Educated  Patient    Methods  Explanation    Comprehension  Verbalized understanding;Need further instruction       PT Short Term Goals - 01/09/18 1631      PT SHORT TERM GOAL #1   Title  Pt to be independent with initial HEP     Time  2    Period  Weeks    Status  New    Target Date  01/23/18        PT Long Term Goals - 01/09/18 0626  PT LONG TERM GOAL #1   Title  Pt to be independent with long term HEP for strengthening    Time  6    Period  Weeks    Status  New    Target Date  02/20/18      PT LONG TERM GOAL #2   Title  Pt to report 0-2/10 pain in knee with activity     Time  6    Period  Weeks    Status  New    Target Date  02/20/18      PT LONG TERM GOAL #3   Title  Pt to demo dynamic stability of L knee, to be WNL for pt age.    Time  6    Period  Weeks    Status  New    Target Date  02/20/18      PT LONG TERM GOAL #4   Title  Pt to demo increased strength of L hip and knee, to be at least 4+/5 to improve stability and pain.     Time  6    Period  Weeks    Status  New    Target Date  02/20/18            Plan - 01/17/18 1149    Clinical Impression Statement  Pt able to perform ther ex today for strengthening, without increased pain. He states "clicking" sensation with LAQ, but no pain. Pt with noted weakness in L quad and hips, requires cueing for mechanics with hip strengthening. He will benefit from continued care and progression of strengthening. Footwear recommendation made for proper fit and support.     Rehab Potential  Good    PT Frequency  1x / week    PT Duration  6 weeks    PT Treatment/Interventions  ADLs/Self Care Home Management;Cryotherapy;Electrical  Stimulation;Iontophoresis 4mg /ml Dexamethasone;Functional mobility training;Stair training;Gait training;Ultrasound;Moist Heat;Therapeutic activities;Therapeutic exercise;Balance training;Neuromuscular re-education;Patient/family education;Orthotic Fit/Training;Passive range of motion;Manual techniques;Dry needling;Taping;Vasopneumatic Device    Consulted and Agree with Plan of Care  Patient       Patient will benefit from skilled therapeutic intervention in order to improve the following deficits and impairments:  Abnormal gait, Decreased strength, Pain, Decreased balance  Visit Diagnosis: Chronic pain of left knee     Problem List Patient Active Problem List   Diagnosis Date Noted  . Atypical chest pain 12/21/2017  . Generalized abdominal pain 12/17/2017  . Anxiety 11/28/2017   Lyndee Hensen, PT, DPT 11:50 AM  01/17/18    [facility] [place], [place], [address] Phone: [phone]   Fax:  [phone]  Name: Bradley [name] MRN: [id number] Date of Birth: Feb 27, 1996

## 2018-01-18 ENCOUNTER — Encounter: Payer: Self-pay | Admitting: Allergy

## 2018-01-18 ENCOUNTER — Other Ambulatory Visit: Payer: Self-pay

## 2018-01-18 ENCOUNTER — Ambulatory Visit: Payer: 59 | Admitting: Allergy

## 2018-01-18 VITALS — BP 120/80 | HR 74 | Temp 97.8°F | Resp 16 | Ht 77.17 in | Wt 344.2 lb

## 2018-01-18 DIAGNOSIS — T7819XA Other adverse food reactions, not elsewhere classified, initial encounter: Secondary | ICD-10-CM

## 2018-01-18 DIAGNOSIS — J3089 Other allergic rhinitis: Secondary | ICD-10-CM | POA: Diagnosis not present

## 2018-01-18 DIAGNOSIS — L299 Pruritus, unspecified: Secondary | ICD-10-CM

## 2018-01-18 DIAGNOSIS — T781XXA Other adverse food reactions, not elsewhere classified, initial encounter: Secondary | ICD-10-CM | POA: Diagnosis not present

## 2018-01-18 MED ORDER — FLUTICASONE PROPIONATE 93 MCG/ACT NA EXHU: 2.0000 | INHALANT_SUSPENSION | Freq: Two times a day (BID) | NASAL | 3 refills | Status: DC

## 2018-01-18 MED ORDER — METHYLPREDNISOLONE ACETATE 80 MG/ML IJ SUSP
80.0000 mg | Freq: Once | INTRAMUSCULAR | Status: AC
  Administered 2018-01-18: 80 mg via INTRAMUSCULAR

## 2018-01-18 NOTE — Patient Instructions (Addendum)
Rhinitis, presumed allergic   - will obtain environmental allergy panel and will call with results next week.  If panel is negative would recommend skin testing when able to hold antihistamines x 3 days.     - depomedrol injection provided in office today.   - may use Allegra-D during times of severe congestion/sinusitis.  Use for brief period as a time and stop once symptoms have improved and resume plain Allegra    - Allegra 180mg  daily for allergy symptoms relief    - recommend use of XHance nasal device (the medication base is Flonase).  This device allows for deeper deposition of the nasal spray into your sinuses to provide better efficacy.   Use 2 sprays each nostril twice a day.  Demonstration video shown today.      - continue nasal saline rinse 1-2 times a day.  Use prior to using your nasal spray.    Pruritus (itchy skin)   - as above will obtain environmental panel   - at this time does not appear you are having hives with the itching   - can take your Allegra twice a day for more itch control  Adverse food reaction   - will obtain jalapeno IgE to determine if you are allergic to this food item.  Would avoid in the diet.     Follow-up 4 month sooner if needed

## 2018-01-18 NOTE — Progress Notes (Signed)
New Patient Note  RE: Bradley [name] MRN: [id number] DOB: 26-Nov-1995 Date of Office Visit: 01/18/2018  Referring provider: Delorse Limber Primary care provider: Brunetta Jeans, PA-C  Chief Complaint: allergies  History of present illness: Bradley [name] is a 22 y.o. male presenting today for consultation for seasonal allergic rhinitis.    Over the last few weeks he has been having itchy skin, nasal congestion and sore throat. He states he has noticed a redness in some areas of his body (stomach and arms) with the itchiness.  He has tried claritin for a few days and then changed to zyrtec as claritin was not helping.  He recently saw his PCP due to these symptoms and was recommended to take Allegra-D which he started yesterday.  He has tried flonase which works very temporarily.  He has been doing saline rinses which he states has helped and is performing 1-2 times a day.    He was stung by a wasp last Friday on his left ring finger with local swelling and redness. Denies any respiratory, CV or GI related symptoms.  He has been stung by stinging insects before without any systemic reaction.   He states about a year ago he developed hives for about a few hours only.  He took benadryl and the hives eventually resolved within hours.  He thinks he was outdoors possibly playing softball when the hives presented.     He states he believes he did have childhood asthma.  No history of eczema and no food allergy history.  He states with jalapenos he get itchy and an upset stomach.    Review of systems: Review of Systems  Constitutional: Negative for chills, fever and malaise/fatigue.  HENT: Positive for congestion and sore throat. Negative for ear discharge, ear pain, nosebleeds and sinus pain.   Eyes: Negative for pain, discharge and redness.  Respiratory: Negative for cough, shortness of breath and wheezing.   Cardiovascular: Negative for chest pain.  Gastrointestinal: Negative  for abdominal pain, constipation, diarrhea, heartburn, nausea and vomiting.  Musculoskeletal: Negative for joint pain.  Skin: Positive for itching.  Neurological: Negative for headaches.    All other systems negative unless noted above in HPI  Past medical history: Past Medical History:  Diagnosis Date  . Anxiety   . Chronic headaches   . Clavicular fracture   . History of chickenpox   . Shingles    22 years old.    Past surgical history: Past Surgical History:  Procedure Laterality Date  . WISDOM TOOTH EXTRACTION      Family history:  Family History  Problem Relation Age of Onset  . Hypertension Mother   . Diabetes Father        type II  . Migraines Sister   . Diabetes Maternal Grandmother     Social history: He lives in a home without carpeting electric heating and central cooling.  There is a dog in the home.  No concern for water damage, mildew or roaches in the home. He works in [employer].  He denies a smoking history.    Medication List: Allergies as of 01/18/2018      Reactions   Hydrocodone Itching   Pt states he feels itchy after taking, but it doesn't preclude him taking hydrocodone if needed      Medication List        Accurate as of 01/18/18  4:23 PM. Always use your most recent med list.  acetaminophen 500 MG tablet Commonly known as:  TYLENOL Take 1,000 mg by mouth every 6 (six) hours as needed for mild pain or headache.   clonazePAM 0.25 MG disintegrating tablet Commonly known as:  KLONOPIN TAKE 1 TABLET BY MOUTH 2 TIMES A DAY AS NEEDED FOR SEIZURE   fexofenadine-pseudoephedrine 180-240 MG 24 hr tablet Commonly known as:  ALLEGRA-D 24 Take 1 tablet by mouth daily.   Fluticasone Propionate 93 MCG/ACT Exhu Commonly known as:  XHANCE Place 2 sprays into the nose 2 (two) times daily.   propranolol 20 MG tablet Commonly known as:  INDERAL Take 1 tablet (20 mg total) by mouth 2 (two) times daily.       Known medication  allergies: Allergies  Allergen Reactions  . Hydrocodone Itching    Pt states he feels itchy after taking, but it doesn't preclude him taking hydrocodone if needed     Physical examination: Blood pressure 120/80, pulse 74, temperature 97.8 F (36.6 C), temperature source Oral, resp. rate 16, height 6' 5.17" (1.96 m), weight (!) 344 lb 3.2 oz (156.1 kg).  General: Alert, interactive, in no acute distress. HEENT: PERRLA, TMs pearly gray, turbinates moderately edematous with clear discharge, post-pharynx non erythematous, no exudates noted Neck: Supple without lymphadenopathy. Lungs: Clear to auscultation without wheezing, rhonchi or rales. {no increased work of breathing. CV: Normal S1, S2 without murmurs. Abdomen: Nondistended, nontender. Skin: Warm and dry, without lesions or rashes. Extremities:  No clubbing, cyanosis or edema. Neuro:   Grossly intact.  Diagnositics/Labs:  Allergy testing:  Deferred due recent antihistamine   Assessment and plan:   Rhinitis, presumed allergic   - will obtain environmental allergy panel and will call with results next week.  If panel is negative would recommend skin testing when able to hold antihistamines x 3 days.     - depomedrol injection provided in office today.   - may use Allegra-D during times of severe congestion/sinusitis.  Use for brief period as a time and stop once symptoms have improved and resume plain Allegra    - Allegra 180mg  daily for allergy symptoms relief    - recommend use of XHance nasal device (the medication base is Flonase).  This device allows for deeper deposition of the nasal spray into your sinuses to provide better efficacy.   Use 2 sprays each nostril twice a day.  Demonstration video shown today.      - continue nasal saline rinse 1-2 times a day.  Use prior to using your nasal spray.    Pruritus    - as above will obtain environmental panel   - at this time does not appear you are having hives with the  itching   - can take your Allegra twice a day for more itch control  Adverse food reaction   - will obtain jalapeno IgE to determine if you are allergic to this food item.  Would avoid in the diet.     Follow-up 4 month sooner if needed   I appreciate the opportunity to take part in Bradley [name]'s care. Please do not hesitate to contact me with questions.  Sincerely,   Prudy Feeler, MD Allergy/Immunology [facility]

## 2018-01-21 ENCOUNTER — Telehealth: Payer: Self-pay

## 2018-01-21 NOTE — Telephone Encounter (Signed)
Patient is calling to see if we can add lobster/shellfish to his blood panel.  Please Advise

## 2018-01-22 ENCOUNTER — Encounter: Payer: Self-pay | Admitting: Physician Assistant

## 2018-01-22 ENCOUNTER — Ambulatory Visit: Payer: 59 | Admitting: Cardiology

## 2018-01-22 ENCOUNTER — Encounter: Payer: Self-pay | Admitting: Cardiology

## 2018-01-22 VITALS — BP 112/70 | HR 61 | Ht 78.0 in | Wt 346.1 lb

## 2018-01-22 DIAGNOSIS — F419 Anxiety disorder, unspecified: Secondary | ICD-10-CM | POA: Diagnosis not present

## 2018-01-22 DIAGNOSIS — R0789 Other chest pain: Secondary | ICD-10-CM | POA: Diagnosis not present

## 2018-01-22 NOTE — Patient Instructions (Signed)
Medication Instructions:  Your physician recommends that you continue on your current medications as directed. Please refer to the Current Medication list given to you today.  Labwork: None ordered  Testing/Procedures: None ordered  Follow-Up: Your physician recommends that you schedule a follow-up appointment in: 6 months with Dr. Krasowski   Any Other Special Instructions Will Be Listed Below (If Applicable).     If you need a refill on your cardiac medications before your next appointment, please call your pharmacy.   

## 2018-01-22 NOTE — Telephone Encounter (Signed)
Spoke to patient advised we added labs

## 2018-01-22 NOTE — Progress Notes (Signed)
Cardiology Office Note:    Date:  01/22/2018   ID:  Bradley [name], DOB 1995/11/03, MRN [id number]  PCP:  Brunetta Jeans, PA-C  Cardiologist:  Jenne Campus, MD    Referring MD: Brunetta Jeans, PA-C   Chief Complaint  Patient presents with  . 1 month Follow up  Doing much better  History of Present Illness:    Bradley [name] is a 22 y.o. male with atypical chest pain.  He did have a stress test did well on the treadmill more than 9 minutes no chest pain tightness squeezing pressure burning in the chest.  Stress test was negative.  We had a long discussion about how to avoid any problems in the future I advised exercise on the regular basis stick with good diet.  He will do that he already exercised on the regular basis  Past Medical History:  Diagnosis Date  . Anxiety   . Chronic headaches   . Clavicular fracture   . History of chickenpox   . Shingles    22 years old.    Past Surgical History:  Procedure Laterality Date  . WISDOM TOOTH EXTRACTION      Current Medications: Current Meds  Medication Sig  . acetaminophen (TYLENOL) 500 MG tablet Take 1,000 mg by mouth every 6 (six) hours as needed for mild pain or headache.  . clonazePAM (KLONOPIN) 0.25 MG disintegrating tablet TAKE 1 TABLET BY MOUTH 2 TIMES A DAY AS NEEDED FOR SEIZURE (Patient taking differently: TAKE 1 TABLET BY MOUTH 2 TIMES A DAY AS NEEDED FOR Anxiety)  . fexofenadine-pseudoephedrine (ALLEGRA-D 24) 180-240 MG 24 hr tablet Take 1 tablet by mouth daily.  . Fluticasone Propionate (XHANCE) 93 MCG/ACT EXHU Place 2 sprays into the nose 2 (two) times daily.  . propranolol (INDERAL) 20 MG tablet Take 1 tablet (20 mg total) by mouth 2 (two) times daily.     Allergies:   Hydrocodone   Social History   Socioeconomic History  . Marital status: Single    Spouse name: Not on file  . Number of children: 0  . Years of education: College  . Highest education level: Not on file  Occupational History  .  Occupation: [employer]  . Financial resource strain: Not on file  . Food insecurity:    Worry: Not on file    Inability: Not on file  . Transportation needs:    Medical: Not on file    Non-medical: Not on file  Tobacco Use  . Smoking status: Never Smoker  . Smokeless tobacco: Never Used  Substance and Sexual Activity  . Alcohol use: Yes    Frequency: Never    Comment: Seldom  . Drug use: Not Currently    Types: Marijuana    Comment: rare use  . Sexual activity: Yes    Partners: Female  Lifestyle  . Physical activity:    Days per week: Not on file    Minutes per session: Not on file  . Stress: Not on file  Relationships  . Social connections:    Talks on phone: Not on file    Gets together: Not on file    Attends religious service: Not on file    Active member of club or organization: Not on file    Attends meetings of clubs or organizations: Not on file    Relationship status: Not on file  Other Topics Concern  . Not on file  Social History Narrative  Lives alone   Caffeine use: 1 cup coffee per day   Right handed      Family History: The patient's family history includes Diabetes in his father and maternal grandmother; Hypertension in his mother; Migraines in his sister. ROS:   Please see the history of present illness.    All 14 point review of systems negative except as described per history of present illness  EKGs/Labs/Other Studies Reviewed:      Recent Labs: 11/27/2017: TSH 1.02 12/14/2017: ALT 16; BUN 15; Creatinine, Ser 0.78; Hemoglobin 14.7; Platelets 199.0; Potassium 4.1; Sodium 139  Recent Lipid Panel No results found for: CHOL, TRIG, HDL, CHOLHDL, VLDL, LDLCALC, LDLDIRECT  Physical Exam:    VS:  BP 112/70   Pulse 61   Ht 6\' 6"  (1.981 m)   Wt (!) 346 lb 1.9 oz (157 kg)   SpO2 98%   BMI 40.00 kg/m     Wt Readings from Last 3 Encounters:  01/22/18 (!) 346 lb 1.9 oz (157 kg)  01/18/18 (!) 344 lb 3.2 oz (156.1 kg)  01/16/18  (!) 347 lb (157.4 kg)     GEN:  Well nourished, well developed in no acute distress HEENT: Normal NECK: No JVD; No carotid bruits LYMPHATICS: No lymphadenopathy CARDIAC: RRR, no murmurs, no rubs, no gallops RESPIRATORY:  Clear to auscultation without rales, wheezing or rhonchi  ABDOMEN: Soft, non-tender, non-distended MUSCULOSKELETAL:  No edema; No deformity  SKIN: Warm and dry LOWER EXTREMITIES: no swelling NEUROLOGIC:  Alert and oriented x 3 PSYCHIATRIC:  Normal affect   ASSESSMENT:    1. Atypical chest pain   2. Anxiety    PLAN:    In order of problems listed above:  1. Atypical chest pain.  Stress test negative overall very low suspicion for coronary artery disease.  We will continue conservative approach and preventive measures. 2. Anxiety.  Followed by internal medicine team.  Him back in 6 months or sooner if he has a problem   Medication Adjustments/Labs and Tests Ordered: Current medicines are reviewed at length with the patient today.  Concerns regarding medicines are outlined above.  No orders of the defined types were placed in this encounter.  Medication changes: No orders of the defined types were placed in this encounter.   Signed, Park Liter, MD, [facility] 01/22/2018 4:00 PM    [place]

## 2018-01-22 NOTE — Telephone Encounter (Signed)
Dr Padgett please advise 

## 2018-01-22 NOTE — Telephone Encounter (Signed)
Bradley [name] ([facility]) is going to add on shellfish panel now.

## 2018-01-23 ENCOUNTER — Ambulatory Visit: Payer: 59 | Admitting: Allergy & Immunology

## 2018-01-25 ENCOUNTER — Ambulatory Visit: Payer: 59 | Admitting: Physician Assistant

## 2018-01-25 ENCOUNTER — Encounter: Payer: Self-pay | Admitting: Physician Assistant

## 2018-01-25 ENCOUNTER — Other Ambulatory Visit: Payer: Self-pay

## 2018-01-25 VITALS — BP 120/78 | HR 80 | Temp 98.0°F | Resp 16 | Ht 77.25 in | Wt 345.0 lb

## 2018-01-25 DIAGNOSIS — K529 Noninfective gastroenteritis and colitis, unspecified: Secondary | ICD-10-CM

## 2018-01-25 MED ORDER — ONDANSETRON HCL 4 MG PO TABS: 4.0000 mg | ORAL_TABLET | Freq: Three times a day (TID) | ORAL | 0 refills | Status: DC | PRN

## 2018-01-25 NOTE — Patient Instructions (Signed)
Please stay well-hydrated and get plenty of rest.  Take the zofran as directed for nausea. Follow the dietary recommendations below.  Start a daily probiotic. The recent antibiotic for you ear is likely worsening the speed of your recovery.    Bland Diet A bland diet consists of foods that do not have a lot of fat or fiber. Foods without fat or fiber are easier for the body to digest. They are also less likely to irritate your mouth, throat, stomach, and other parts of your gastrointestinal tract. A bland diet is sometimes called a BRAT diet. What is my plan? Your health care provider or dietitian may recommend specific changes to your diet to prevent and treat your symptoms, such as:  Eating small meals often.  Cooking food until it is soft enough to chew easily.  Chewing your food well.  Drinking fluids slowly.  Not eating foods that are very spicy, sour, or fatty.  Not eating citrus fruits, such as oranges and grapefruit.  What do I need to know about this diet?  Eat a variety of foods from the bland diet food list.  Do not follow a bland diet longer than you have to.  Ask your health care provider whether you should take vitamins. What foods can I eat? Grains  Hot cereals, such as cream of wheat. Bread, crackers, or tortillas made from refined white flour. Rice. Vegetables Canned or cooked vegetables. Mashed or boiled potatoes. Fruits Bananas. Applesauce. Other types of cooked or canned fruit with the skin and seeds removed, such as canned peaches or pears. Meats and Other Protein Sources Scrambled eggs. Creamy peanut butter or other nut butters. Lean, well-cooked meats, such as chicken or fish. Tofu. Soups or broths. Dairy Low-fat dairy products, such as milk, cottage cheese, or yogurt. Beverages Water. Herbal tea. Apple juice. Sweets and Desserts Pudding. Custard. Fruit gelatin. Ice cream. Fats and Oils Mild salad dressings. Canola or olive oil. The items listed  above may not be a complete list of allowed foods or beverages. Contact your dietitian for more options. What foods are not recommended? Foods and ingredients that are often not recommended include:  Spicy foods, such as hot sauce or salsa.  Fried foods.  Sour foods, such as pickled or fermented foods.  Raw vegetables or fruits, especially citrus or berries.  Caffeinated drinks.  Alcohol.  Strongly flavored seasonings or condiments.  The items listed above may not be a complete list of foods and beverages that are not allowed. Contact your dietitian for more information. This information is not intended to replace advice given to you by your health care provider. Make sure you discuss any questions you have with your health care provider. Document Released: 01/03/2016 Document Revised: 02/17/2016 Document Reviewed: 09/23/2014 Elsevier Interactive Patient Education  2018 [employer].

## 2018-01-25 NOTE — Progress Notes (Signed)
Patient presents to clinic today c/o diarrhea x 4 days, described as 4-5 loose stools per day with abdominal cramping. Denies any fever, chills, or vomiting. Some mild nausea noted. Denies BRBPR or melena. Denies tenesmus. Denies recent travel or contact with similar symptoms. Did eat some sushi prior to onset of symptoms but no other abnormal food sources. Symptoms improved initially but worsened after he completed a Z-[facility]    Past Medical History:  Diagnosis Date  . Anxiety   . Chronic headaches   . Clavicular fracture   . History of chickenpox   . Shingles    22 years old.    Current Outpatient Medications on File Prior to Visit  Medication Sig Dispense Refill  . acetaminophen (TYLENOL) 500 MG tablet Take 1,000 mg by mouth every 6 (six) hours as needed for mild pain or headache.    . clonazePAM (KLONOPIN) 0.25 MG disintegrating tablet TAKE 1 TABLET BY MOUTH 2 TIMES A DAY AS NEEDED FOR SEIZURE 10 tablet 0  . diphenhydrAMINE (BENADRYL) 25 mg capsule Take 50 mg by mouth daily.    . propranolol (INDERAL) 20 MG tablet Take 1 tablet (20 mg total) by mouth 2 (two) times daily. 60 tablet 3  . fexofenadine-pseudoephedrine (ALLEGRA-D 24) 180-240 MG 24 hr tablet Take 1 tablet by mouth daily. (Patient not taking: Reported on 01/25/2018) 30 tablet 3  . Fluticasone Propionate (XHANCE) 93 MCG/ACT EXHU Place 2 sprays into the nose 2 (two) times daily. (Patient not taking: Reported on 01/25/2018) 32 mL 3   No current facility-administered medications on file prior to visit.     Allergies  Allergen Reactions  . Hydrocodone Itching    Pt states he feels itchy after taking, but it doesn't preclude him taking hydrocodone if needed    Family History  Problem Relation Age of Onset  . Hypertension Mother   . Diabetes Father        type II  . Migraines Sister   . Diabetes Maternal Grandmother     Social History   Socioeconomic History  . Marital  status: Single    Spouse name: Not on file  . Number of children: 0  . Years of education: College  . Highest education level: Not on file  Occupational History  . Occupation: [employer]  . Financial resource strain: Not on file  . Food insecurity:    Worry: Not on file    Inability: Not on file  . Transportation needs:    Medical: Not on file    Non-medical: Not on file  Tobacco Use  . Smoking status: Never Smoker  . Smokeless tobacco: Never Used  Substance and Sexual Activity  . Alcohol use: Yes    Frequency: Never    Comment: Seldom  . Drug use: Not Currently    Types: Marijuana    Comment: rare use  . Sexual activity: Yes    Partners: Female  Lifestyle  . Physical activity:    Days per week: Not on file    Minutes per session: Not on file  . Stress: Not on file  Relationships  . Social connections:    Talks on phone: Not on file    Gets together: Not on file    Attends religious service: Not on file    Active member of club or organization: Not on file    Attends meetings of clubs or organizations: Not on file  Relationship status: Not on file  Other Topics Concern  . Not on file  Social History Narrative   Lives alone   Caffeine use: 1 cup coffee per day   Right handed     Review of Systems - See HPI.  All other ROS are negative.  BP 120/78   Pulse 80   Temp 98 F (36.7 C) (Oral)   Resp 16   Ht 6' 5.25" (1.962 m)   Wt (!) 345 lb (156.5 kg)   SpO2 98%   BMI 40.65 kg/m   Physical Exam  Constitutional: He appears well-developed and well-nourished.  HENT:  Head: Normocephalic and atraumatic.  Right Ear: Tympanic membrane and external ear normal.  Left Ear: Tympanic membrane and external ear normal.  Nose: Nose normal.  Mouth/Throat: Oropharynx is clear and moist. No oropharyngeal exudate.  Eyes: Conjunctivae are normal.  Neck: Neck supple.  Cardiovascular: Normal rate, regular rhythm and normal heart sounds.  Pulmonary/Chest:  Effort normal and breath sounds normal.  Abdominal: Soft. He exhibits no distension and no mass. Bowel sounds are increased. There is generalized tenderness. There is no rebound and no guarding. No hernia.  Lymphadenopathy:    He has no cervical adenopathy.  Vitals reviewed.   Recent Results (from the past 2160 hour(s))  TSH     Status: None   Collection Time: 11/27/17  3:51 PM  Result Value Ref Range   TSH 1.02 0.35 - 4.50 uIU/mL  POCT urinalysis dipstick     Status: Normal   Collection Time: 12/05/17  1:51 PM  Result Value Ref Range   Color, UA yellow    Clarity, UA clear    Glucose, UA negative    Bilirubin, UA negative    Ketones, UA negative    Spec Grav, UA 1.025 1.010 - 1.025   Blood, UA negative    pH, UA 5.0 5.0 - 8.0   Protein, UA negative    Urobilinogen, UA 0.2 0.2 or 1.0 E.U./dL   Nitrite, UA negative    Leukocytes, UA Negative Negative   Appearance     Odor    Basic metabolic panel     Status: None   Collection Time: 12/08/17  2:23 PM  Result Value Ref Range   Sodium 139 135 - 145 mmol/L   Potassium 4.3 3.5 - 5.1 mmol/L   Chloride 104 101 - 111 mmol/L   CO2 27 22 - 32 mmol/L   Glucose, Bld 95 65 - 99 mg/dL   BUN 10 6 - 20 mg/dL   Creatinine, Ser 0.76 0.61 - 1.24 mg/dL   Calcium 9.6 8.9 - 10.3 mg/dL   GFR calc non Af Amer >60 >60 mL/min   GFR calc Af Amer >60 >60 mL/min    Comment: (NOTE) The eGFR has been calculated using the CKD EPI equation. This calculation has not been validated in all clinical situations. eGFR's persistently <60 mL/min signify possible Chronic Kidney Disease.    Anion gap 8 5 - 15    Comment: Performed at [facility], [place] [address] [place], [place] [address]  CBC     Status: None   Collection Time: 12/08/17  2:23 PM  Result Value Ref Range   WBC 7.6 4.0 - 10.5 K/uL   RBC 5.04 4.22 - 5.81 MIL/uL   Hemoglobin 15.4 13.0 - 17.0 g/dL   HCT 46.6 39.0 - 52.0 %   MCV 92.5 78.0 - 100.0 fL   MCH 30.6 26.0 - 34.0  pg   MCHC 33.0 30.0 - 36.0 g/dL   RDW 12.6 11.5 - 15.5 %   Platelets 219 150 - 400 K/uL    Comment: Performed at [facility], [place] [address] [place], [place] [address]  D-dimer, quantitative (not at [facility])     Status: None   Collection Time: 12/08/17  2:23 PM  Result Value Ref Range   D-Dimer, Quant <0.27 0.00 - 0.50 ug/mL-FEU    Comment: (NOTE) At the manufacturer cut-off of 0.50 ug/mL FEU, this assay has been documented to exclude PE with a sensitivity and negative predictive value of 97 to 99%.  At this time, this assay has not been approved by the FDA to exclude DVT/VTE. Results should be correlated with clinical presentation. Performed at [facility], [place] [address] [place], [place] [address]   I-stat troponin, ED     Status: None   Collection Time: 12/08/17  2:36 PM  Result Value Ref Range   Troponin i, poc 0.01 0.00 - 0.08 ng/mL   Comment 3            Comment: Due to the release kinetics of cTnI, a negative result within the first hours of the onset of symptoms does not rule out myocardial infarction with certainty. If myocardial infarction is still suspected, repeat the test at appropriate intervals.   CBC w/Diff     Status: None   Collection Time: 12/14/17  2:48 PM  Result Value Ref Range   WBC 7.5 4.0 - 10.5 K/uL   RBC 4.80 4.22 - 5.81 Mil/uL   Hemoglobin 14.7 13.0 - 17.0 g/dL   HCT 43.2 39.0 - 52.0 %   MCV 90.0 78.0 - 100.0 fl   MCHC 33.9 30.0 - 36.0 g/dL   RDW 12.9 11.5 - 15.5 %   Platelets 199.0 150.0 - 400.0 K/uL   Neutrophils Relative % 64.0 43.0 - 77.0 %   Lymphocytes Relative 27.2 12.0 - 46.0 %   Monocytes Relative 6.0 3.0 - 12.0 %   Eosinophils Relative 2.0 0.0 - 5.0 %   Basophils Relative 0.8 0.0 - 3.0 %   Neutro Abs 4.8 1.4 - 7.7 K/uL   Lymphs Abs 2.0 0.7 - 4.0 K/uL   Monocytes Absolute 0.4 0.1 - 1.0 K/uL   Eosinophils Absolute 0.1 0.0 - 0.7 K/uL   Basophils Absolute 0.1 0.0 - 0.1 K/uL  Comp Met (CMET)      Status: Abnormal   Collection Time: 12/14/17  2:48 PM  Result Value Ref Range   Sodium 139 135 - 145 mEq/L   Potassium 4.1 3.5 - 5.1 mEq/L   Chloride 105 96 - 112 mEq/L   CO2 24 19 - 32 mEq/L   Glucose, Bld 103 (H) 70 - 99 mg/dL   BUN 15 6 - 23 mg/dL   Creatinine, Ser 0.78 0.40 - 1.50 mg/dL   Total Bilirubin 0.5 0.2 - 1.2 mg/dL   Alkaline Phosphatase 60 39 - 117 U/L   AST 12 0 - 37 U/L   ALT 16 0 - 53 U/L   Total Protein 6.9 6.0 - 8.3 g/dL   Albumin 4.5 3.5 - 5.2 g/dL   Calcium 9.3 8.4 - 10.5 mg/dL   GFR 131.72 >60.00 mL/min  Lipase     Status: None   Collection Time: 12/14/17  2:48 PM  Result Value Ref Range   Lipase 18.0 Repeated and verified X2. 11.0 - 59.0 U/L  Exercise Tolerance Test  Status: None   Collection Time: 12/28/17  4:07 PM  Result Value Ref Range   Rest HR 84 bpm   Rest BP 145/64 mmHg   RPE 17    Exercise duration (sec) 23 sec   Percent HR 98 %   Exercise duration (min) 9 min   Estimated workload 10.5 METS   Peak HR 196 bpm   Peak BP 185/47 mmHg   MPHR 198 bpm  Allergens w/Total IgE Area 2     Status: None (Preliminary result)   Collection Time: 01/18/18  2:34 PM  Result Value Ref Range   Class Description WILL FOLLOW    IgE (Immunoglobulin E), Serum WILL FOLLOW    D Pteronyssinus IgE WILL FOLLOW    D Farinae IgE WILL FOLLOW    Cat Dander IgE WILL FOLLOW    Dog Dander IgE WILL FOLLOW    Gu[place] Grass IgE WILL FOLLOW    Timothy Grass IgE WILL FOLLOW    Johnson Grass IgE WILL FOLLOW    Cockroach, German IgE WILL FOLLOW    Penicillium Chrysogen IgE WILL FOLLOW    Cladosporium Herbarum IgE WILL FOLLOW    Aspergillus Fumigatus IgE WILL FOLLOW    Alternaria Alternata IgE WILL FOLLOW    Maple/Box Elder IgE WILL FOLLOW    Common Silver Wendee Copp IgE WILL FOLLOW    [place], Mountain IgE WILL FOLLOW    Oak, White IgE WILL FOLLOW    [place], American IgE WILL FOLLOW    Cottonwood IgE WILL FOLLOW    Pecan, Hickory IgE WILL FOLLOW    White Mulberry IgE WILL  FOLLOW    Ragweed, Short IgE WILL FOLLOW    Pigweed, Rough IgE WILL FOLLOW    Sheep Sorrel IgE Qn WILL FOLLOW    Mouse Urine IgE WILL FOLLOW   Jalapeno Pepper, IgE     Status: None   Collection Time: 01/18/18  2:34 PM  Result Value Ref Range   Jalapeno Pepper <0.35 <0.35 kU/L   Class Interpretation 0     Comment: This conventional EIA uses allergen-coated discs from several suppliers and an enzyme-labeled anti-IgE. CLASS INTERPRETATION: <0.35 kU/L=0, Below Detection; 0.35-0.69 kU/L= 1, Low Positive; 0.70-3.49 kU/L= 2, Moderate Positive; 3.50-17.49 kU/L= 3, Positive; 17.50-49.99 kU/L= 4, Strong Positive; 50.00-99.99 kU/L= 5, Very Strong Positive; >99.99 kU/L= 6, Very Strong Positive *This test was developed and its performance characteristics determined by [employer]. It has not been cleared or approved by the U.S. Food and Drug Administration.     Assessment/Plan: 1. Gastroenteritis Most likely viral as it improved quickly. No bloody stool or febrile illness. The azithromycin likely exacerbated the issue. Start BRAT diet, increase hydration. Rx Zofran for nausea. Probiotic recommended. No indication for ABX or stool testing presently. Strict return precautions reviewed with patient.    Leeanne Rio, PA-C

## 2018-01-27 LAB — ALLERGENS W/TOTAL IGE AREA 2
Alternaria Alternata IgE: <0.1 kU/L
Aspergillus Fumigatus IgE: <0.1 kU/L
COCKROACH, GERMAN IGE: 0.94 kU/L — AB
COTTONWOOD IGE: 0.13 kU/L — AB
Cat Dander IgE: 0.41 kU/L — AB
Cladosporium Herbarum IgE: <0.1 kU/L
D001-IGE D PTERONYSSINUS: 0.21 kU/L — AB
D002-IGE D FARINAE: 0.2 kU/L — AB
E005-IGE DOG DANDER: 0.35 kU/L — AB
G002-IGE BERMUDA GRASS: 0.12 kU/L — AB
IGE (IMMUNOGLOBULIN E), SERUM: 226 [IU]/mL (ref 6–495)
Johnson Grass IgE: 0.12 kU/L — AB
Maple/Box Elder IgE: 0.1 kU/L — AB
Pecan, Hickory IgE: 0.1 kU/L — AB
Penicillium Chrysogen IgE: <0.1 kU/L
Pigweed, Rough IgE: <0.1 kU/L
Ragweed, Short IgE: 0.11 kU/L — AB
Sheep Sorrel IgE Qn: 0.12 kU/L — AB
T003-IGE COMMON SILVER BIRCH: 0.18 kU/L — AB
T007-IGE OAK, WHITE: 0.12 kU/L — AB
T008-IGE ELM, AMERICAN: 0.17 kU/L — AB
Timothy Grass IgE: <0.1 kU/L
White Mulberry IgE: <0.1 kU/L

## 2018-01-27 LAB — JALAPENO PEPPER, IGE
Class Interpretation: 0
Jalapeno Pepper: <0.35 kU/L (ref <0.35)

## 2018-01-30 ENCOUNTER — Encounter: Payer: Self-pay | Admitting: Physician Assistant

## 2018-01-30 ENCOUNTER — Telehealth: Payer: Self-pay | Admitting: *Deleted

## 2018-01-30 ENCOUNTER — Telehealth: Payer: Self-pay | Admitting: Allergy

## 2018-01-30 MED ORDER — ALBUTEROL SULFATE HFA 108 (90 BASE) MCG/ACT IN AERS: 2.0000 | INHALATION_SPRAY | RESPIRATORY_TRACT | 1 refills | Status: DC | PRN

## 2018-01-30 MED ORDER — EPINEPHRINE 0.3 MG/0.3ML IJ SOAJ: INTRAMUSCULAR | 1 refills | Status: DC

## 2018-01-30 NOTE — Telephone Encounter (Signed)
Patient states he has some difficulty breathing but not like he needs to go to a hospital. Some wheezing some times. He has taken his antihistamines and done a netipot

## 2018-01-30 NOTE — Telephone Encounter (Signed)
Ok yes please prescribe AuviQ and albuterol inhaler for as needed use.

## 2018-01-30 NOTE — Telephone Encounter (Signed)
See other telephone note.  

## 2018-01-30 NOTE — Telephone Encounter (Signed)
-----   Message from [place], MD sent at 01/30/2018  2:32 PM EDT ----- Environmental allergy panel shows moderate level IgE to cockroach; low level IgE to dust mites, cat, dog, grasses, trees.  Please provide with avoidance measures.    Jalapeno IgE is negative.   Shellfish panel was added on to the labs and does show elevated IgE to shrimp, scallop and clam.  If he has eaten these foods and had reaction after ingestion he should avoid shellfish and will need an epinephrine device.

## 2018-01-30 NOTE — Telephone Encounter (Signed)
Spoke with patient states results reviewed avoidance measures placed up front for patient to pick up tomorrow when he comes in to sign consent to start immunotherapy.. Also advised patient to avoid shellfish and epinephrine would be sent in patient states that he has no problems when he eats shrimp or scallops just when he eats lobster he gets itchy throat and itchy skin. Also patient is having trouble breathing through nasal passages and also cant breathe through mouth at times would like an inhaler sent in.

## 2018-01-30 NOTE — Telephone Encounter (Signed)
Done

## 2018-01-30 NOTE — Telephone Encounter (Signed)
Patient is calling wanting to know if lab results are back and what they are??  Patient had labs drawn 01/18/2018  Patient is also having issues breathing Patient states symptoms have worsened and he has called his PCP, and was advised to contact us.  Patient states that its not throat closing or gasping for air, but he is struggling to breath at times  Please advise

## 2018-01-31 ENCOUNTER — Ambulatory Visit: Payer: 59 | Admitting: Allergy

## 2018-01-31 ENCOUNTER — Encounter: Payer: Self-pay | Admitting: Sports Medicine

## 2018-01-31 ENCOUNTER — Encounter: Payer: Self-pay | Admitting: Physician Assistant

## 2018-01-31 ENCOUNTER — Ambulatory Visit: Payer: 59 | Admitting: Physical Therapy

## 2018-01-31 ENCOUNTER — Encounter: Payer: Self-pay | Admitting: Allergy

## 2018-01-31 VITALS — BP 124/80 | HR 72 | Resp 20

## 2018-01-31 DIAGNOSIS — G8929 Other chronic pain: Secondary | ICD-10-CM

## 2018-01-31 DIAGNOSIS — M25562 Pain in left knee: Secondary | ICD-10-CM | POA: Diagnosis not present

## 2018-01-31 DIAGNOSIS — J3089 Other allergic rhinitis: Secondary | ICD-10-CM

## 2018-01-31 DIAGNOSIS — T781XXD Other adverse food reactions, not elsewhere classified, subsequent encounter: Secondary | ICD-10-CM

## 2018-01-31 DIAGNOSIS — L299 Pruritus, unspecified: Secondary | ICD-10-CM

## 2018-01-31 DIAGNOSIS — R0689 Other abnormalities of breathing: Secondary | ICD-10-CM

## 2018-01-31 MED ORDER — MONTELUKAST SODIUM 10 MG PO TABS: 10.0000 mg | ORAL_TABLET | Freq: Every day | ORAL | 5 refills | Status: DC

## 2018-01-31 NOTE — Progress Notes (Signed)
Follow-up Note  RE: Bradley [name] MRN: [id number] DOB: 25-Nov-1995 Date of Office Visit: 01/31/2018   History of present illness: Bradley [name] is a 22 y.o. male presenting today for difficulty breathing.   He was seen for initial evaluation on 01/18/18 by myself.  He has allergic rhinitis and reports he has been having a lot of sinus congestion where it is hard to breathe from his nose and is having to do more mouth breathing.  Also feels his throat feels scratchy and sometimes feels choked up.  After his last visit I recommended he take allegra and use XHance nasa device.  He had been taking Allegra D for his sinus congestion. He states plain allegra makes his "stomach upset" thus he stopped that and changed to zyrtec.  He does not feel the zyrtec is helping.  He has been using Xhance 2 sprays twice a day and does feel it is helping some.  However he states that he also is using OTC flonase once a day as well.  He was provided with depomedrol injection at last visit and he is not sure if this has helped his allergy symptoms.     He did notify our office that he felt like he was having some trouble breathing and he was then prescribed an albuterol inhaler to see if this would help resolve this symptoms.  He picked this albuterol inhaler up yesterday and used it once with questionable improvement in symptoms.     He also states with lobster he had develops itchiness however states he has not noticed any symptoms with shrimp, crab, scallops or clams.  He states he had scallops last month with no symptoms.    Review of systems: Review of Systems  Constitutional: Negative for chills, fever and malaise/fatigue.  HENT: Positive for congestion. Negative for ear discharge, ear pain, nosebleeds, sinus pain and sore throat.   Eyes: Negative for pain, discharge and redness.  Respiratory: Positive for shortness of breath.   Cardiovascular: Negative for chest pain.  Gastrointestinal: Negative for  abdominal pain, constipation, diarrhea, heartburn, nausea and vomiting.  Musculoskeletal: Negative for joint pain.  Skin: Negative for itching and rash.  Neurological: Negative for headaches.    All other systems negative unless noted above in HPI  Past medical/social/surgical/family history have been reviewed and are unchanged unless specifically indicated below.  No changes  Medication List: Allergies as of 01/31/2018      Reactions   Hydrocodone Itching   Pt states he feels itchy after taking, but it doesn't preclude him taking hydrocodone if needed      Medication List        Accurate as of 01/31/18  7:25 PM. Always use your most recent med list.          acetaminophen 500 MG tablet Commonly known as:  TYLENOL Take 1,000 mg by mouth every 6 (six) hours as needed for mild pain or headache.   albuterol 108 (90 Base) MCG/ACT inhaler Commonly known as:  PROAIR HFA Inhale 2 puffs into the lungs every 4 (four) hours as needed for wheezing or shortness of breath.   clonazePAM 0.25 MG disintegrating tablet Commonly known as:  KLONOPIN TAKE 1 TABLET BY MOUTH 2 TIMES A DAY AS NEEDED FOR SEIZURE   diphenhydrAMINE 25 mg capsule Commonly known as:  BENADRYL Take 50 mg by mouth daily.   EPINEPHrine 0.3 mg/0.3 mL Soaj injection Commonly known as:  AUVI-Q Use as directed for severe allergic reaction  fexofenadine-pseudoephedrine 180-240 MG 24 hr tablet Commonly known as:  ALLEGRA-D 24 Take 1 tablet by mouth daily.   Fluticasone Propionate 93 MCG/ACT Exhu Commonly known as:  XHANCE Place 2 sprays into the nose 2 (two) times daily.   montelukast 10 MG tablet Commonly known as:  SINGULAIR Take 1 tablet (10 mg total) by mouth at bedtime.   ondansetron 4 MG tablet Commonly known as:  ZOFRAN Take 1 tablet (4 mg total) by mouth every 8 (eight) hours as needed for nausea or vomiting.   propranolol 20 MG tablet Commonly known as:  INDERAL Take 1 tablet (20 mg total) by mouth  2 (two) times daily.       Known medication allergies: Allergies  Allergen Reactions  . Hydrocodone Itching    Pt states he feels itchy after taking, but it doesn't preclude him taking hydrocodone if needed     Physical examination: Blood pressure 124/80, pulse 72, resp. rate 20, SpO2 98 %.  General: Alert, interactive, in no acute distress. HEENT: PERRLA, TMs pearly gray, turbinates moderately edematous without discharge, post-pharynx non erythematous. Neck: Supple without lymphadenopathy. Lungs: Clear to auscultation without wheezing, rhonchi or rales. {no increased work of breathing. CV: Normal S1, S2 without murmurs. Abdomen: Nondistended, nontender. Skin: Warm and dry, without lesions or rashes. Extremities:  No clubbing, cyanosis or edema. Neuro:   Grossly intact.  Diagnositics/Labs: Labs:  Component     Latest Ref Rng & Units 01/18/2018  IgE (Immunoglobulin E), Serum     6 - 495 IU/mL 226  D Pteronyssinus IgE     Class 0/I kU/L 0.21 (A)  D Farinae IgE     Class 0/I kU/L 0.20 (A)  Cat Dander IgE     Class I kU/L 0.41 (A)  Dog Dander IgE     Class I kU/L 0.35 (A)  [place] Grass IgE     Class 0/I kU/L 0.12 (A)  Timothy Grass IgE     Class 0 kU/L <0.10  Johnson Grass IgE     Class 0/I kU/L 0.12 (A)  Cockroach, German IgE     Class II kU/L 0.94 (A)  Penicillium Chrysogen IgE     Class 0 kU/L <0.10  Cladosporium Herbarum IgE     Class 0 kU/L <0.10  Aspergillus Fumigatus IgE     Class 0 kU/L <0.10  Alternaria Alternata IgE     Class 0 kU/L <0.10  Maple/Box Elder IgE     Class 0/I kU/L 0.10 (A)  Common Silver Wendee Copp IgE     Class 0/I kU/L 0.18 (A)  Cedar, Mountain IgE     Class 0 kU/L <0.10  Oak, White IgE     Class 0/I kU/L 0.12 (A)  Elm, American IgE     Class 0/I kU/L 0.17 (A)  Cottonwood IgE     Class 0/I kU/L0 0.13 (A)  Pecan, Hickory IgE     Class 0/I kU/L 0.10 (A)  White Mulberry IgE     Class 0 kU/L <0.10  Ragweed, Short IgE     Class 0/I  kU/L 0.11 (A)  Pigweed, Rough IgE     Class 0 kU/L <0.10  Sheep Sorrel IgE Qn     Class 0/I kU/L 0.12 (A)  Mouse Urine IgE     Class 0 kU/L <0.10   Component     Latest Ref Rng & Units 01/18/2018  Clam IgE     Class I kU/L 0.35 (A)  F023-IgE Crab  Class 0 kU/L <0.10  Shrimp IgE     Class II kU/L 1.26 (A)  Scallop IgE     Class II kU/L 0.73 (A)  F290-IgE Oyster     Class 0 kU/L <0.10  F080-IgE Lobster     Class 0 kU/L <0.10  Jalapeno Pepper     <0.35 kU/L <0.35  Class Interpretation      0    Spirometry: FEV1: 5.51L  132%, FVC: 6.34L 128%, ratio consistent with nonobstructive pattern   Assessment and plan:   Rhinitis, allergic   - allergen avoidance measures for dust mites, cat, dog, grass, trees, weeds, cockroach.  Avoidance measures provided   - may use Allegra-D during times of severe congestion/sinusitis.  Use for brief periods at a time and stop once symptoms have improved and resume plain Allegra    - Xyzal 5mg  daily for allergy symptoms relief    - Use of XHance nasal device (the medication base is Flonase).  This device allows for deeper deposition of the nasal spray into your sinuses to provide better efficacy.   Use 2 sprays each nostril twice a day.     - continue nasal saline rinse 1-2 times a day.  Use prior to using your nasal spray.      - start Singulair 10mg  daily at bedtime    - allergen immunotherapy discussed again today including protocol, benefits and risk.  Informational handout provided.  If interested in this therapuetic option you can check with your insurance carrier for coverage and he will let us know if he wants to proceed.   Pruritus (itchy skin)   -  Allergen avoidance measures as above   - at this time does not appear you are having hives with the itching   - can take your antihistamine twice a day for more itch control  Adverse food reaction   - jalapeno IgE is negative.  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      - would avoid shellfish due to cross-reactive/cross-contamination that can occur among shellfish   - have access to self-injectable epinephrine (Epipen or AuviQ) 0.3mg  at all times   - follow emergency action plan in case of allergic reaction   Difficulty breathing    - likely due to sinus congestion playing a large role.  Start singulair as above.     - he was prescribed an albuterol inhaler to use 2 puffs every 4-6 hours as needed for cough/wheeze/shortness of breath/chest tightness.  May use 15-20 minutes prior to activity.   Monitor frequency of use.     - lung function testing is normal    Follow-up 4 month sooner if needed  I appreciate the opportunity to take part in Bradley [name]'s care. Please do not hesitate to contact me with questions.  Sincerely,   Prudy Feeler, MD Allergy/Immunology [facility] [place] [facility] [place]

## 2018-01-31 NOTE — Patient Instructions (Addendum)
Rhinitis, allergic   - allergen avoidance measures for dust mites, cat, dog, grass, trees, weeds, cockroach.  Avoidance measures provided   - may use Allegra-D during times of severe congestion/sinusitis.  Use for brief periods at a time and stop once symptoms have improved and resume plain Allegra    - Xyzal 5mg  daily for allergy symptoms relief    - Use of XHance nasal device (the medication base is Flonase).  This device allows for deeper deposition of the nasal spray into your sinuses to provide better efficacy.   Use 2 sprays each nostril twice a day.     - continue nasal saline rinse 1-2 times a day.  Use prior to using your nasal spray.      - start Singulair 10mg  daily at bedtime    - allergen immunotherapy discussed again today including protocol, benefits and risk.  Informational handout provided.  If interested in this therapuetic option you can check with your insurance carrier for coverage and he will let us know if he wants to proceed.   Pruritus (itchy skin)   -  Allergen avoidance measures as above   - at this time does not appear you are having hives with the itching   - can take your antihistamine twice a day for more itch control  Adverse food reaction   - jalapeno IgE is negative.  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     - would avoid shellfish due to cross-reactive/cross-contamination that can occur among shellfish   - have access to self-injectable epinephrine (Epipen or AuviQ) 0.3mg  at all times   - follow emergency action plan in case of allergic reaction   Difficulty breathing    - likely due to sinus congestion playing a large role.  Start singulair as above.     - he was prescribed an albuterol inhaler to use 2 puffs every 4-6 hours as needed for cough/wheeze/shortness of breath/chest tightness.  May use 15-20 minutes prior to activity.   Monitor frequency of use.     - lung function testing is normal    Follow-up 4 month sooner if  needed

## 2018-02-03 NOTE — Therapy (Signed)
[facility] [address] [place], [place], [address] Phone: [phone]   Fax:  [phone]  Physical Therapy Treatment/Discharge  Patient Details  Name: Bradley [name] MRN: [id number] Date of Birth: 05/22/1996 Referring Provider: Teresa Coombs   Encounter Date: 01/31/2018  [name] End of Session - 02/03/18 2114    Visit Number  3    Number of Visits  12    [name] Start Time  1602    [name] Stop Time  1642    [name] Time Calculation (min)  40 min    Activity Tolerance  Patient tolerated treatment well    Behavior During Therapy  [facility] for tasks assessed/performed       Past Medical History:  Diagnosis Date  . Anxiety   . Chronic headaches   . Clavicular fracture   . History of chickenpox   . Shingles    22 years old.    Past Surgical History:  Procedure Laterality Date  . WISDOM TOOTH EXTRACTION      There were no vitals filed for this visit.  Subjective Assessment - 02/03/18 2112    Subjective  [name] last seen 2 weeks ago. He was sick and unable to attend [name]. He has been doing HEP. Also has obtained new footwear with proper size, width, and support for his foot type. He states that he has not had any pain in several days, feels that his knee is doing much better.     Currently in Pain?  No/denies    Pain Score  0-No pain         [facility] [name] Assessment - 02/03/18 0001      Strength   Overall Strength Comments  L Knee: 4+/5/ Hip: 4 to 4+/5                    [facility] Adult [name] Treatment/Exercise - 02/03/18 0001      Posture/Postural Control   Posture Comments  --      Exercises   Exercises  Knee/Hip      Knee/Hip Exercises: Stretches   Active Hamstring Stretch  --      Knee/Hip Exercises: Aerobic   Stationary Bike  L2 x 8 min      Knee/Hip Exercises: Standing   Hip Flexion  20 reps    Hip Flexion Limitations  RTB    Hip Abduction  2 sets;10 reps;Both    Abduction Limitations  RTB    Hip Extension  20 reps    Extension Limitations  RTB    Functional Squat  20 reps    Functional Squat Limitations  with education for proper form    SLS  With UE flex and ROt x10 each bil;    SLS with Vectors         Knee/Hip Exercises: Seated   Long Arc Quad  20 reps;Both    Long Arc Quad Weight  2 lbs.    Hamstring Curl  20 reps    Hamstring Limitations  GTB      Knee/Hip Exercises: Supine   Seder  20 reps    Straight Leg Raises  20 reps      Knee/Hip Exercises: Sidelying   Hip ABduction  --             [name] Education - 02/03/18 2114    Education provided  Yes    Education Details  Reviewed final HEP, progression of running    Person(s) Educated  Patient  Methods  Explanation    Comprehension  Verbalized understanding       [name] Short Term Goals - 02/03/18 2116      [name] SHORT TERM GOAL #1   Title  [name] to be independent with initial HEP     Time  2    Period  Weeks    Status  Achieved        [name] Long Term Goals - 02/03/18 2116      [name] LONG TERM GOAL #1   Title  [name] to be independent with long term HEP for strengthening    Time  6    Period  Weeks    Status  Achieved      [name] LONG TERM GOAL #2   Title  [name] to report 0-2/10 pain in knee with activity     Time  6    Period  Weeks    Status  Achieved      [name] LONG TERM GOAL #3   Title  [name] to demo dynamic stability of L knee, to be WNL for [name] age.    Time  6    Period  Weeks    Status  Partially Met      [name] LONG TERM GOAL #4   Title  [name] to demo increased strength of L hip and knee, to be at least 4+/5 to improve stability and pain.     Time  6    Period  Weeks    Status  Achieved            Plan - 02/03/18 2116    Clinical Impression Statement  [name] has made good improvements in pain, states he has been pain free. He demonstrated increased strength in hip and knee, but still has mild strength deficits, as well as deficits with dynamic and single leg stability. [name] requests d/c to HEP, final HEP reviewed today, discussed need  to continue strength and stabilization exercises.     Rehab Potential  Good    [name] Frequency  1x / week    [name] Duration  6 weeks    [name] Treatment/Interventions  ADLs/Self Care Home Management;Cryotherapy;Electrical Stimulation;Iontophoresis 28m/ml Dexamethasone;Functional mobility training;Stair training;Gait training;Ultrasound;Moist Heat;Therapeutic activities;Therapeutic exercise;Balance training;Neuromuscular re-education;Patient/family education;Orthotic Fit/Training;Passive range of motion;Manual techniques;Dry needling;Taping;Vasopneumatic Device    Consulted and Agree with Plan of Care  Patient       Patient will benefit from skilled therapeutic intervention in order to improve the following deficits and impairments:  Abnormal gait, Decreased strength, Pain, Decreased balance  Visit Diagnosis: Chronic pain of left knee     Problem List Patient Active Problem List   Diagnosis Date Noted  . Atypical chest pain 12/21/2017  . Generalized abdominal pain 12/17/2017  . Anxiety 11/28/2017    LLyndee Hensen [name], DPT 9:26 PM  02/03/18    [facility] [address]Phone: [phone]  Fax:  [phone] Name: Bradley SCORSONEMRN: [id number] of Birth: [birth year] PHYSICAL THERAPY DISCHARGE SUMMARY  Visits from Start of Care: 3  Plan: Patient agrees to discharge.  Patient goals were met. Patient is being discharged due to meeting the stated rehab goals.  ?????      LLyndee Hensen [name], DPT 9:26 PM  02/03/18

## 2018-02-05 ENCOUNTER — Encounter: Payer: Self-pay | Admitting: Physician Assistant

## 2018-02-05 ENCOUNTER — Other Ambulatory Visit: Payer: Self-pay | Admitting: Physician Assistant

## 2018-02-05 ENCOUNTER — Telehealth: Payer: Self-pay

## 2018-02-05 ENCOUNTER — Other Ambulatory Visit: Payer: Self-pay

## 2018-02-05 ENCOUNTER — Ambulatory Visit: Payer: 59 | Admitting: Physician Assistant

## 2018-02-05 VITALS — BP 110/80 | HR 60 | Temp 98.1°F | Resp 16 | Ht 77.25 in | Wt 350.0 lb

## 2018-02-05 DIAGNOSIS — J312 Chronic pharyngitis: Secondary | ICD-10-CM | POA: Diagnosis not present

## 2018-02-05 LAB — WOUND CULTURE
MICRO NUMBER:: 90443897
SPECIMEN QUALITY:: ADEQUATE

## 2018-02-05 LAB — POCT RAPID STREP A (OFFICE): RAPID STREP A SCREEN: NEGATIVE

## 2018-02-05 NOTE — Progress Notes (Signed)
Patient presents to clinic today c/o continued sore throat over the past few months associated with nasal allergy symptoms managed by Allergist. Denies fever, chills, malaise or fatigue. Still having PND on a daily basis. Denies sinus pain, ear pain or tooth pain. Notes some occasional dysphagia and a tight sensation in the throat. Denies heart burn or indigestion.   Past Medical History:  Diagnosis Date  . Anxiety   . Chronic headaches   . Clavicular fracture   . History of chickenpox   . Shingles    22 years old.    Current Outpatient Medications on File Prior to Visit  Medication Sig Dispense Refill  . acetaminophen (TYLENOL) 500 MG tablet Take 1,000 mg by mouth every 6 (six) hours as needed for mild pain or headache.    . albuterol (PROAIR HFA) 108 (90 Base) MCG/ACT inhaler Inhale 2 puffs into the lungs every 4 (four) hours as needed for wheezing or shortness of breath. 1 Inhaler 1  . cetirizine (ZYRTEC) 10 MG tablet Take 10 mg by mouth daily.    . clonazePAM (KLONOPIN) 0.25 MG disintegrating tablet TAKE 1 TABLET BY MOUTH 2 TIMES A DAY AS NEEDED FOR SEIZURE 10 tablet 0  . EPINEPHrine (AUVI-Q) 0.3 mg/0.3 mL IJ SOAJ injection Use as directed for severe allergic reaction 2 Device 1  . Fluticasone Propionate (XHANCE) 93 MCG/ACT EXHU Place 2 sprays into the nose 2 (two) times daily. 32 mL 3  . montelukast (SINGULAIR) 10 MG tablet Take 1 tablet (10 mg total) by mouth at bedtime. 30 tablet 5  . propranolol (INDERAL) 20 MG tablet Take 1 tablet (20 mg total) by mouth 2 (two) times daily. 60 tablet 3   No current facility-administered medications on file prior to visit.     Allergies  Allergen Reactions  . Hydrocodone Itching    Pt states he feels itchy after taking, but it doesn't preclude him taking hydrocodone if needed    Family History  Problem Relation Age of Onset  . Hypertension Mother   . Diabetes Father        type II  . Migraines Sister   . Diabetes Maternal Grandmother      Social History   Socioeconomic History  . Marital status: Single    Spouse name: Not on file  . Number of children: 0  . Years of education: College  . Highest education level: Not on file  Occupational History  . Occupation: [employer]  . Financial resource strain: Not on file  . Food insecurity:    Worry: Not on file    Inability: Not on file  . Transportation needs:    Medical: Not on file    Non-medical: Not on file  Tobacco Use  . Smoking status: Never Smoker  . Smokeless tobacco: Never Used  Substance and Sexual Activity  . Alcohol use: Yes    Frequency: Never    Comment: Seldom  . Drug use: Not Currently    Types: Marijuana    Comment: rare use  . Sexual activity: Yes    Partners: Female  Lifestyle  . Physical activity:    Days per week: Not on file    Minutes per session: Not on file  . Stress: Not on file  Relationships  . Social connections:    Talks on phone: Not on file    Gets together: Not on file    Attends religious service: Not on file    Active member  of club or organization: Not on file    Attends meetings of clubs or organizations: Not on file    Relationship status: Not on file  Other Topics Concern  . Not on file  Social History Narrative   Lives alone   Caffeine use: 1 cup coffee per day   Right handed    Review of Systems - See HPI.  All other ROS are negative.  BP 110/80   Pulse 60   Temp 98.1 F (36.7 C) (Oral)   Resp 16   Wt (!) 350 lb (158.8 kg)   SpO2 99%   BMI 41.24 kg/m   Physical Exam  Constitutional: He appears well-developed and well-nourished.  HENT:  Head: Normocephalic and atraumatic.  Right Ear: Tympanic membrane normal.  Left Ear: Tympanic membrane normal.  Mouth/Throat: Uvula is midline. No oral lesions. No uvula swelling. Posterior oropharyngeal erythema (very mild erythema) present. No oropharyngeal exudate, posterior oropharyngeal edema or tonsillar abscesses.  Eyes: EOM are normal.    Neck: Neck supple. No thyromegaly present.  Cardiovascular: Normal rate, regular rhythm and normal heart sounds.  Pulmonary/Chest: Effort normal. No stridor. No respiratory distress. He has no rhonchi.  Lymphadenopathy:    He has no cervical adenopathy.  Vitals reviewed.  Recent Results (from the past 2160 hour(s))  TSH     Status: None   Collection Time: 11/27/17  3:51 PM  Result Value Ref Range   TSH 1.02 0.35 - 4.50 uIU/mL  POCT urinalysis dipstick     Status: Normal   Collection Time: 12/05/17  1:51 PM  Result Value Ref Range   Color, UA yellow    Clarity, UA clear    Glucose, UA negative    Bilirubin, UA negative    Ketones, UA negative    Spec Grav, UA 1.025 1.010 - 1.025   Blood, UA negative    pH, UA 5.0 5.0 - 8.0   Protein, UA negative    Urobilinogen, UA 0.2 0.2 or 1.0 E.U./dL   Nitrite, UA negative    Leukocytes, UA Negative Negative   Appearance     Odor    Basic metabolic panel     Status: None   Collection Time: 12/08/17  2:23 PM  Result Value Ref Range   Sodium 139 135 - 145 mmol/L   Potassium 4.3 3.5 - 5.1 mmol/L   Chloride 104 101 - 111 mmol/L   CO2 27 22 - 32 mmol/L   Glucose, Bld 95 65 - 99 mg/dL   BUN 10 6 - 20 mg/dL   Creatinine, Ser 0.76 0.61 - 1.24 mg/dL   Calcium 9.6 8.9 - 10.3 mg/dL   GFR calc non Af Amer >60 >60 mL/min   GFR calc Af Amer >60 >60 mL/min    Comment: (NOTE) The eGFR has been calculated using the CKD EPI equation. This calculation has not been validated in all clinical situations. eGFR's persistently <60 mL/min signify possible Chronic Kidney Disease.    Anion gap 8 5 - 15    Comment: Performed at [facility], [place] [address] [place], [place] [address]  CBC     Status: None   Collection Time: 12/08/17  2:23 PM  Result Value Ref Range   WBC 7.6 4.0 - 10.5 K/uL   RBC 5.04 4.22 - 5.81 MIL/uL   Hemoglobin 15.4 13.0 - 17.0 g/dL   HCT 46.6 39.0 - 52.0 %   MCV 92.5 78.0 - 100.0 fL   MCH 30.6 26.0 -  34.0 pg    MCHC 33.0 30.0 - 36.0 g/dL   RDW 12.6 11.5 - 15.5 %   Platelets 219 150 - 400 K/uL    Comment: Performed at [facility], [place] [address] [place], [place] [address]  D-dimer, quantitative (not at [facility])     Status: None   Collection Time: 12/08/17  2:23 PM  Result Value Ref Range   D-Dimer, Quant <0.27 0.00 - 0.50 ug/mL-FEU    Comment: (NOTE) At the manufacturer cut-off of 0.50 ug/mL FEU, this assay has been documented to exclude PE with a sensitivity and negative predictive value of 97 to 99%.  At this time, this assay has not been approved by the FDA to exclude DVT/VTE. Results should be correlated with clinical presentation. Performed at [facility], [place] [address], [place], [place] [address]   I-stat troponin, ED     Status: None   Collection Time: 12/08/17  2:36 PM  Result Value Ref Range   Troponin i, poc 0.01 0.00 - 0.08 ng/mL   Comment 3            Comment: Due to the release kinetics of cTnI, a negative result within the first hours of the onset of symptoms does not rule out myocardial infarction with certainty. If myocardial infarction is still suspected, repeat the test at appropriate intervals.   CBC w/Diff     Status: None   Collection Time: 12/14/17  2:48 PM  Result Value Ref Range   WBC 7.5 4.0 - 10.5 K/uL   RBC 4.80 4.22 - 5.81 Mil/uL   Hemoglobin 14.7 13.0 - 17.0 g/dL   HCT 43.2 39.0 - 52.0 %   MCV 90.0 78.0 - 100.0 fl   MCHC 33.9 30.0 - 36.0 g/dL   RDW 12.9 11.5 - 15.5 %   Platelets 199.0 150.0 - 400.0 K/uL   Neutrophils Relative % 64.0 43.0 - 77.0 %   Lymphocytes Relative 27.2 12.0 - 46.0 %   Monocytes Relative 6.0 3.0 - 12.0 %   Eosinophils Relative 2.0 0.0 - 5.0 %   Basophils Relative 0.8 0.0 - 3.0 %   Neutro Abs 4.8 1.4 - 7.7 K/uL   Lymphs Abs 2.0 0.7 - 4.0 K/uL   Monocytes Absolute 0.4 0.1 - 1.0 K/uL   Eosinophils Absolute 0.1 0.0 - 0.7 K/uL   Basophils Absolute 0.1 0.0 - 0.1 K/uL  Comp Met (CMET)      Status: Abnormal   Collection Time: 12/14/17  2:48 PM  Result Value Ref Range   Sodium 139 135 - 145 mEq/L   Potassium 4.1 3.5 - 5.1 mEq/L   Chloride 105 96 - 112 mEq/L   CO2 24 19 - 32 mEq/L   Glucose, Bld 103 (H) 70 - 99 mg/dL   BUN 15 6 - 23 mg/dL   Creatinine, Ser 0.78 0.40 - 1.50 mg/dL   Total Bilirubin 0.5 0.2 - 1.2 mg/dL   Alkaline Phosphatase 60 39 - 117 U/L   AST 12 0 - 37 U/L   ALT 16 0 - 53 U/L   Total Protein 6.9 6.0 - 8.3 g/dL   Albumin 4.5 3.5 - 5.2 g/dL   Calcium 9.3 8.4 - 10.5 mg/dL   GFR 131.72 >60.00 mL/min  Lipase     Status: None   Collection Time: 12/14/17  2:48 PM  Result Value Ref Range   Lipase 18.0 Repeated and verified X2. 11.0 - 59.0 U/L  Exercise Tolerance Test  Status: None   Collection Time: 12/28/17  4:07 PM  Result Value Ref Range   Rest HR 84 bpm   Rest BP 145/64 mmHg   RPE 17    Exercise duration (sec) 23 sec   Percent HR 98 %   Exercise duration (min) 9 min   Estimated workload 10.5 METS   Peak HR 196 bpm   Peak BP 185/47 mmHg   MPHR 198 bpm  Allergens w/Total IgE Area 2     Status: Abnormal   Collection Time: 01/18/18  2:34 PM  Result Value Ref Range   Class Description Comment     Comment:     Levels of Specific IgE       Class  Description of Class     ---------------------------  -----  --------------------                    < 0.10         0         Negative            0.10 -    0.31         0/I       Equivocal/Low            0.32 -    0.55         I         Low            0.56 -    1.40         II        Moderate            1.41 -    3.90         III       High            3.91 -   19.00         IV        Very High           19.01 -  100.00         V         Very High                   >100.00         VI        Very High    IgE (Immunoglobulin E), Serum 226 6 - 495 IU/mL    Comment:               **Please note reference interval change**   D Pteronyssinus IgE 0.21 (A) Class 0/I kU/L   D Farinae IgE 0.20 (A) Class 0/I kU/L    Cat Dander IgE 0.41 (A) Class I kU/L   Dog Dander IgE 0.35 (A) Class I kU/L   Gu[place] Grass IgE 0.12 (A) Class 0/I kU/L   Timothy Grass IgE <0.10 Class 0 kU/L   Johnson Grass IgE 0.12 (A) Class 0/I kU/L   Cockroach, German IgE 0.94 (A) Class II kU/L   Penicillium Chrysogen IgE <0.10 Class 0 kU/L   Cladosporium Herbarum IgE <0.10 Class 0 kU/L   Aspergillus Fumigatus IgE <0.10 Class 0 kU/L   Alternaria Alternata IgE <0.10 Class 0 kU/L   Maple/Box Elder IgE 0.10 (A) Class 0/I kU/L   Common Silver Wendee Copp IgE 0.18 (A) Class 0/I kU/L   Cedar, [place] IgE <0.10 Class 0 kU/L   Oak,  White IgE 0.12 (A) Class 0/I kU/L   Elm, American IgE 0.17 (A) Class 0/I kU/L   Cottonwood IgE 0.13 (A) Class 0/I kU/L   Pecan, Hickory IgE 0.10 (A) Class 0/I kU/L   White Mulberry IgE <0.10 Class 0 kU/L   Ragweed, Short IgE 0.11 (A) Class 0/I kU/L   Pigweed, Rough IgE <0.10 Class 0 kU/L   Sheep Sorrel IgE Qn 0.12 (A) Class 0/I kU/L   Mouse Urine IgE <0.10 Class 0 kU/L  Jalapeno Pepper, IgE     Status: None   Collection Time: 01/18/18  2:34 PM  Result Value Ref Range   Jalapeno Pepper <0.35 <0.35 kU/L   Class Interpretation 0     Comment: This conventional EIA uses allergen-coated discs from several suppliers and an enzyme-labeled anti-IgE. CLASS INTERPRETATION: <0.35 kU/L=0, Below Detection; 0.35-0.69 kU/L= 1, Low Positive; 0.70-3.49 kU/L= 2, Moderate Positive; 3.50-17.49 kU/L= 3, Positive; 17.50-49.99 kU/L= 4, Strong Positive; 50.00-99.99 kU/L= 5, Very Strong Positive; >99.99 kU/L= 6, Very Strong Positive *This test was developed and its performance characteristics determined by [employer]. It has not been cleared or approved by the U.S. Food and Drug Administration.   Allergen Profile, Shellfish     Status: Abnormal   Collection Time: 01/18/18  2:34 PM  Result Value Ref Range   Class Description Comment     Comment:     Levels of Specific IgE       Class  Description of Class      ---------------------------  -----  --------------------                    < 0.10         0         Negative            0.10 -    0.31         0/I       Equivocal/Low            0.32 -    0.55         I         Low            0.56 -    1.40         II        Moderate            1.41 -    3.90         III       High            3.91 -   19.00         IV        Very High           19.01 -  100.00         V         Very High                   >100.00         VI        Very High    Clam IgE 0.35 (A) Class I kU/L   F023-IgE Crab <0.10 Class 0 kU/L   Shrimp IgE 1.26 (A) Class II kU/L   Scallop IgE 0.73 (A) Class II kU/L   F290-IgE Oyster <0.10 Class 0 kU/L   F080-IgE Lobster <0.10 Class 0 kU/L  Specimen status report  Status: None (Preliminary result)   Collection Time: 01/18/18  2:34 PM  Result Value Ref Range   specimen status report Comment     Comment: Written Authorization Written Authorization     Assessment/Plan: 1. Chronic sore throat Strep throat negative. Only very mild erythema noted. Most likely secondary to drainage as patient with significant allergies (is about to start shots). Continue Zyrtec, Xhance and Singulair. Start nightly saline nasal rinses. Will refer to ENT for further assessment of ongoing symptoms. Will send throat culture today.  - Ambulatory referral to ENT   Leeanne Rio, PA-C

## 2018-02-05 NOTE — Telephone Encounter (Signed)
Called [employer] and spoke to Santiago Glad and let her know that we did not receive the results from the Wound Culture sent on 01/02/18. Santiago Glad stated that they did not have our account number and I gave them this and Santiago Glad will fax over the results. Call verification number is [id number] E.

## 2018-02-05 NOTE — Patient Instructions (Signed)
Your strep test is negative.  We are sending a throat specimen for further testing.  I do not see sign of a bacterial infection on examination today. I am concerned the level of drainage is contributing.  Continue current medications as directed by allergist. Increase fluids.  Start saline nasal rinse. I am setting you up with ENT for further assessment of this ongoing issue as they can take a look further in the throat in office than we can here in primary care.

## 2018-02-05 NOTE — Addendum Note (Signed)
Addended by: Brunetta Jeans on: 02/05/2018 01:27 PM   Modules accepted: Orders

## 2018-02-07 LAB — CULTURE, GROUP A STREP
MICRO NUMBER: 90586479
SPECIMEN QUALITY: ADEQUATE

## 2018-02-07 NOTE — Addendum Note (Signed)
Addended by: Theresia Lo on: 02/07/2018 02:03 PM   Modules accepted: Orders

## 2018-02-08 ENCOUNTER — Encounter: Payer: Self-pay | Admitting: *Deleted

## 2018-02-08 ENCOUNTER — Telehealth: Payer: Self-pay | Admitting: Neurology

## 2018-02-08 ENCOUNTER — Encounter: Payer: Self-pay | Admitting: Neurology

## 2018-02-08 DIAGNOSIS — G441 Vascular headache, not elsewhere classified: Secondary | ICD-10-CM

## 2018-02-08 DIAGNOSIS — R9089 Other abnormal findings on diagnostic imaging of central nervous system: Secondary | ICD-10-CM

## 2018-02-08 NOTE — Progress Notes (Signed)
4 VIAL SET MADE. EXP: 02-09-19. HV

## 2018-02-08 NOTE — Telephone Encounter (Signed)
I called the patient.  We will recheck the MRI of the brain to follow-up with the abnormality seen previously.

## 2018-02-10 ENCOUNTER — Other Ambulatory Visit: Payer: Self-pay | Admitting: Neurology

## 2018-02-11 ENCOUNTER — Encounter: Payer: Self-pay | Admitting: Physician Assistant

## 2018-02-11 ENCOUNTER — Telehealth: Payer: Self-pay | Admitting: Neurology

## 2018-02-11 DIAGNOSIS — J301 Allergic rhinitis due to pollen: Secondary | ICD-10-CM | POA: Diagnosis not present

## 2018-02-11 NOTE — Telephone Encounter (Signed)
MRI pending faxed clinical notes to [facility]. Case # [id number].

## 2018-02-12 ENCOUNTER — Other Ambulatory Visit: Payer: Self-pay

## 2018-02-12 ENCOUNTER — Emergency Department (HOSPITAL_COMMUNITY)
Admission: EM | Admit: 2018-02-12 | Discharge: 2018-02-13 | Disposition: A | Discharge status: Home / Self Care | Payer: 59 | Attending: Emergency Medicine | Admitting: Emergency Medicine

## 2018-02-12 ENCOUNTER — Encounter (HOSPITAL_COMMUNITY): Payer: Self-pay | Admitting: Emergency Medicine

## 2018-02-12 ENCOUNTER — Ambulatory Visit: Payer: 59 | Admitting: Sports Medicine

## 2018-02-12 DIAGNOSIS — J3089 Other allergic rhinitis: Secondary | ICD-10-CM | POA: Diagnosis not present

## 2018-02-12 DIAGNOSIS — Z79899 Other long term (current) drug therapy: Secondary | ICD-10-CM | POA: Diagnosis not present

## 2018-02-12 DIAGNOSIS — R109 Unspecified abdominal pain: Secondary | ICD-10-CM | POA: Diagnosis present

## 2018-02-12 DIAGNOSIS — N200 Calculus of kidney: Secondary | ICD-10-CM | POA: Diagnosis not present

## 2018-02-12 LAB — URINALYSIS, ROUTINE W REFLEX MICROSCOPIC
Bacteria, UA: NONE SEEN
Bilirubin Urine: NEGATIVE
GLUCOSE, UA: NEGATIVE mg/dL
Ketones, ur: NEGATIVE mg/dL
Leukocytes, UA: NEGATIVE
Nitrite: NEGATIVE
PROTEIN: NEGATIVE mg/dL
SPECIFIC GRAVITY, URINE: 1.013 (ref 1.005–1.030)
pH: 6 (ref 5.0–8.0)

## 2018-02-12 MED ORDER — ONDANSETRON 4 MG PO TBDP
4.0000 mg | ORAL_TABLET | Freq: Once | ORAL | Status: AC
  Administered 2018-02-12: 4 mg via ORAL
  Filled 2018-02-12: qty 1

## 2018-02-12 MED ORDER — OXYCODONE-ACETAMINOPHEN 5-325 MG PO TABS
1.0000 | ORAL_TABLET | Freq: Once | ORAL | Status: AC
  Administered 2018-02-12: 1 via ORAL
  Filled 2018-02-12: qty 1

## 2018-02-12 NOTE — Telephone Encounter (Signed)
Patient returned my call he is scheduled for 04/30/18 on the [facility] mobile unit.

## 2018-02-12 NOTE — Telephone Encounter (Signed)
[employer] Auth: [id number] (exp. 02/11/18 to 03/28/18)  lvm for pt to call back about scheduling mri

## 2018-02-12 NOTE — ED Triage Notes (Signed)
Pt reports dysuria ongoing x2 hours, R sided flank pain. Taking clindamycin for throat virus x1 week.

## 2018-02-13 ENCOUNTER — Encounter: Payer: Self-pay | Admitting: Physician Assistant

## 2018-02-13 ENCOUNTER — Ambulatory Visit: Payer: 59 | Admitting: Allergy

## 2018-02-13 ENCOUNTER — Emergency Department (HOSPITAL_COMMUNITY): Payer: 59

## 2018-02-13 LAB — ALLERGEN PROFILE, SHELLFISH
Clam IgE: 0.35 kU/L — AB
F023-IgE Crab: <0.1 kU/L
F290-IgE Oyster: <0.1 kU/L
SCALLOP IGE: 0.73 kU/L — AB
SHRIMP IGE: 1.26 kU/L — AB

## 2018-02-13 LAB — SPECIMEN STATUS REPORT

## 2018-02-13 MED ORDER — OXYCODONE-ACETAMINOPHEN 5-325 MG PO TABS
1.0000 | ORAL_TABLET | Freq: Once | ORAL | Status: AC
  Administered 2018-02-13: 1 via ORAL
  Filled 2018-02-13: qty 1

## 2018-02-13 MED ORDER — IBUPROFEN 800 MG PO TABS: 800.0000 mg | ORAL_TABLET | Freq: Three times a day (TID) | ORAL | 0 refills | Status: DC

## 2018-02-13 NOTE — ED Notes (Signed)
Patient transported to CT 

## 2018-02-13 NOTE — ED Notes (Signed)
Patient returned from CT, no distress noted

## 2018-02-13 NOTE — ED Provider Notes (Signed)
[facility] Provider Note   CSN: [id number] Arrival date & time: 02/12/18  2102     History   Chief Complaint Chief Complaint  Patient presents with  . Flank Pain  . Dysuria    HPI Bradley [name] is a 22 y.o. male.  Patient presents to the emergency department with a chief complaint of right flank pain.  He states that earlier today he had some dysuria.  States that he thought he had a UTI and went to the pharmacy to get some medication, but while he was there he experienced 9 out of 10 right flank pain.  He reports family history remarkable for kidney stones, but states that he has never personally had a kidney stone.  He came to the emergency department for evaluation.  He was given a Percocet in triage, and reports having had good relief with this.  He currently rates his pain is a 3 out of 10.  He denies any fever, chills, nausea, or vomiting.  Denies any other associated symptoms.  The history is provided by the patient. No language interpreter was used.    Past Medical History:  Diagnosis Date  . Anxiety   . Chronic headaches   . Clavicular fracture   . History of chickenpox   . Shingles    22 years old.    Patient Active Problem List   Diagnosis Date Noted  . Atypical chest pain 12/21/2017  . Generalized abdominal pain 12/17/2017  . Anxiety 11/28/2017    Past Surgical History:  Procedure Laterality Date  . WISDOM TOOTH EXTRACTION          Home Medications    Prior to Admission medications   Medication Sig Start Date End Date Taking? Authorizing Provider  acetaminophen (TYLENOL) 500 MG tablet Take 1,000 mg by mouth every 6 (six) hours as needed for mild pain or headache.   Yes [provider]  albuterol (PROAIR HFA) 108 (90 Base) MCG/ACT inhaler Inhale 2 puffs into the lungs every 4 (four) hours as needed for wheezing or shortness of breath. 01/30/18  Yes Padgett, Rae Halsted, MD  cetirizine (ZYRTEC) 10  MG tablet Take 10 mg by mouth daily.   Yes [provider]  clindamycin (CLEOCIN) 300 MG capsule Take 300 mg by mouth 3 (three) times daily. 02/07/18  Yes [provider]  clonazePAM (KLONOPIN) 0.25 MG disintegrating tablet TAKE 1 TABLET BY MOUTH 2 TIMES A DAY AS NEEDED FOR SEIZURE 12/27/17  Yes Brunetta Jeans, PA-C  EPINEPHrine (AUVI-Q) 0.3 mg/0.3 mL IJ SOAJ injection Use as directed for severe allergic reaction 01/30/18  Yes Padgett, Rae Halsted, MD  Fluticasone Propionate Truett Perna) 93 MCG/ACT EXHU Place 2 sprays into the nose 2 (two) times daily. 01/18/18  Yes Padgett, Rae Halsted, MD  montelukast (SINGULAIR) 10 MG tablet Take 1 tablet (10 mg total) by mouth at bedtime. 01/31/18  Yes Padgett, Rae Halsted, MD  naproxen sodium (ALEVE) 220 MG tablet Take 220 mg by mouth daily as needed (for pain).   Yes [provider]  propranolol (INDERAL) 20 MG tablet Take 1 tablet (20 mg total) by mouth 2 (two) times daily. 11/19/17  Yes Kathrynn Ducking, MD    Family History Family History  Problem Relation Age of Onset  . Hypertension Mother   . Diabetes Father        type II  . Migraines Sister   . Diabetes Maternal Grandmother     Social History Social  History   Tobacco Use  . Smoking status: Never Smoker  . Smokeless tobacco: Never Used  Substance Use Topics  . Alcohol use: Yes    Frequency: Never    Comment: Seldom  . Drug use: Not Currently    Types: Marijuana    Comment: rare use     Allergies   Shellfish allergy and Hydrocodone   Review of Systems Review of Systems  All other systems reviewed and are negative.    Physical Exam Updated Vital Signs BP (!) 154/84 (BP Location: Right Arm)   Pulse 71   Temp 97.8 F (36.6 C) (Oral)   Resp 18   Ht 6\' 5"  (1.956 m)   Wt (!) 161 kg (355 lb)   SpO2 99%   BMI 42.10 kg/m   Physical Exam  Constitutional: He is oriented to person, place, and time. No distress.  HENT:  Head: Normocephalic  and atraumatic.  Eyes: Pupils are equal, round, and reactive to light. Conjunctivae and EOM are normal.  Neck: No tracheal deviation present.  Cardiovascular: Normal rate.  Pulmonary/Chest: Effort normal. No respiratory distress.  Abdominal: Soft.  No focal abdominal tenderness, no RLQ tenderness or pain at McBurney's point, no RUQ tenderness or Murphy's sign, no left-sided abdominal tenderness, no fluid wave, or signs of peritonitis   Musculoskeletal: Normal range of motion.  Neurological: He is alert and oriented to person, place, and time.  Skin: Skin is warm and dry. He is not diaphoretic.  Psychiatric: Judgment normal.  Nursing note and vitals reviewed.    ED Treatments / Results  Labs (all labs ordered are listed, but only abnormal results are displayed) Labs Reviewed  URINALYSIS, ROUTINE W REFLEX MICROSCOPIC - Abnormal; Notable for the following components:      Result Value   Hgb urine dipstick LARGE (*)    All other components within normal limits    EKG None  Radiology No results found.  Procedures Procedures (including critical care time)  Medications Ordered in ED Medications  ondansetron (ZOFRAN-ODT) disintegrating tablet 4 mg (4 mg Oral Given 02/12/18 2121)  oxyCODONE-acetaminophen (PERCOCET/ROXICET) 5-325 MG per tablet 1 tablet (1 tablet Oral Given 02/12/18 2121)     Initial Impression / Assessment and Plan / ED Course  I have reviewed the triage vital signs and the nursing notes.  Pertinent labs & imaging results that were available during my care of the patient were reviewed by me and considered in my medical decision making (see chart for details).     Patient with right flank pain.  Urinalysis is positive for hemoglobin and red blood cells.  Concern for kidney stone.  Will check CT renal study.  Pain adequately controlled at this time.  Punctate stone in bladder consistent with recently passed stone.  Will discharge to home.  Final Clinical  Impressions(s) / ED Diagnoses   Final diagnoses:  Kidney stone    ED Discharge Orders        Ordered    ibuprofen (ADVIL,MOTRIN) 800 MG tablet  3 times daily     02/13/18 0135       Montine Circle, PA-C 02/13/18 [place], [place], DO 02/13/18 [phone]

## 2018-02-13 NOTE — ED Notes (Signed)
Patient Alert and oriented to baseline. Stable and ambulatory to baseline. Patient verbalized understanding of the discharge instructions.  Patient belongings were taken by the patient, significant other is diving the patient home.

## 2018-02-14 ENCOUNTER — Ambulatory Visit (INDEPENDENT_AMBULATORY_CARE_PROVIDER_SITE_OTHER): Payer: 59 | Admitting: *Deleted

## 2018-02-14 ENCOUNTER — Other Ambulatory Visit: Payer: Self-pay

## 2018-02-14 ENCOUNTER — Encounter: Payer: Self-pay | Admitting: Physician Assistant

## 2018-02-14 ENCOUNTER — Ambulatory Visit: Payer: 59 | Admitting: Physician Assistant

## 2018-02-14 ENCOUNTER — Other Ambulatory Visit: Payer: Self-pay | Admitting: Neurology

## 2018-02-14 VITALS — BP 124/80 | HR 81 | Temp 98.1°F | Resp 17 | Ht 77.25 in | Wt 350.8 lb

## 2018-02-14 DIAGNOSIS — J309 Allergic rhinitis, unspecified: Secondary | ICD-10-CM | POA: Diagnosis not present

## 2018-02-14 DIAGNOSIS — F0781 Postconcussional syndrome: Secondary | ICD-10-CM

## 2018-02-14 DIAGNOSIS — N210 Calculus in bladder: Secondary | ICD-10-CM | POA: Diagnosis not present

## 2018-02-14 NOTE — Progress Notes (Signed)
Immunotherapy   Patient Details  Name: Bradley [name] MRN: [id number] Date of Birth: 14-Nov-1995  02/14/2018  [name] started injections for  Mite-Cockroach-Ragweed & Pollen-Pet. Following schedule: B  Frequency:2 times per week Epi-Pen:Epi-Pen Available  Consent signed and patient instructions given. No problems after 30 minutes in the office.   Horris Latino 02/14/2018, 2:10 PM

## 2018-02-14 NOTE — Patient Instructions (Addendum)
I am sending your stone for further evaluation. Until we get this result, keep well-hydrated. It most likely is a calcium oxalate stone as this is most common. Please follow the dietary recommendations at the bottom of this summary.   In terms of headache, this is most consistent with postconcussive syndrome. You need plenty of mental rest. This is the one case where I prescribe naps! Limit screen time as much as possible. Caffeine may help some with headaches. Alternate tylenol and ibuprofen (600 mg) for headaches. This should slowly improve and resolve.    Post-Concussion Syndrome Post-concussion syndrome is the symptoms that can occur after a head injury. These symptoms can last from weeks to months. Follow these instructions at home:  Take medicines only as told by your doctor.  Do not take aspirin.  Sleep with your head raised to help with headaches.  Avoid activities that can cause another head injury. ? Do not play contact sports like football, hockey, soccer, or basketball. ? Do not do other risky activities like downhill skiing, martial arts, or horseback riding until your doctor says it is okay.  Keep all follow-up visits as told by your doctor. This is important. Contact a doctor if:  You have a harder time: ? Paying attention. ? Focusing. ? Remembering. ? Learning new information. ? Dealing with stress.  You need more time to complete tasks.  You are easily bothered (irritable).  You have more symptoms. Get help if you have any of these symptoms for more than two weeks after your injury:  Long-lasting (chronic) headaches.  Dizziness.  Trouble balancing.  Feeling sick to your stomach (nauseous).  Trouble with your vision.  Noise or light bothers you more.  Depression.  Mood swings.  Feeling worried (anxious).  Easily bothered.  Memory problems.  Trouble concentrating or paying attention.  Sleep problems.  Feeling tired all of the time.  Get  help right away if:  You feel confused.  You feel very sleepy.  You are hard to wake up.  You feel sick to your stomach.  You keep throwing up (vomiting).  You feel like you are moving when you are not (vertigo).  Your eyes move back and forth very quickly.  You start shaking (convulsing) or pass out (faint).  You have very bad headaches that do not get better with medicine.  You cannot use your arms or legs like normal.  One of the black centers of your eyes (pupils) is bigger than the other.  You have clear or bloody fluid coming from your nose or ears.  Your problems get worse, not better. This information is not intended to replace advice given to you by your health care provider. Make sure you discuss any questions you have with your health care provider. Document Released: 10/19/2004 Document Revised: 02/17/2016 Document Reviewed: 12/17/2013 Elsevier Interactive Patient Education  2018 [employer].   Dietary Guidelines to Help Prevent Kidney Stones Kidney stones are deposits of minerals and salts that form inside your kidneys. Your risk of developing kidney stones may be greater depending on your diet, your lifestyle, the medicines you take, and whether you have certain medical conditions. Most people can reduce their chances of developing kidney stones by following the instructions below. Depending on your overall health and the type of kidney stones you tend to develop, your dietitian may give you more specific instructions. What are tips for following this plan? Reading food labels  Choose foods with "no salt added" or "low-salt" labels.  Limit your sodium intake to less than 1500 mg per day.  Choose foods with calcium for each meal and snack. Try to eat about 300 mg of calcium at each meal. Foods that contain 200-500 mg of calcium per serving include: ? 8 oz (237 ml) of milk, fortified nondairy milk, and fortified fruit juice. ? 8 oz (237 ml) of kefir, yogurt, and  soy yogurt. ? 4 oz (118 ml) of tofu. ? 1 oz of cheese. ? 1 cup (300 g) of dried figs. ? 1 cup (91 g) of cooked broccoli. ? 1-3 oz can of sardines or mackerel.  Most people need 1000 to 1500 mg of calcium each day. Talk to your dietitian about how much calcium is recommended for you. Shopping  Buy plenty of fresh fruits and vegetables. Most people do not need to avoid fruits and vegetables, even if they contain nutrients that may contribute to kidney stones.  When shopping for convenience foods, choose: ? Whole pieces of fruit. ? Premade salads with dressing on the side. ? Low-fat fruit and yogurt smoothies.  Avoid buying frozen meals or prepared deli foods.  Look for foods with live cultures, such as yogurt and kefir. Cooking  Do not add salt to food when cooking. Place a salt shaker on the table and allow each person to add his or her own salt to taste.  Use vegetable protein, such as beans, textured vegetable protein (TVP), or tofu instead of meat in pasta, casseroles, and soups. Meal planning  Eat less salt, if told by your dietitian. To do this: ? Avoid eating processed or premade food. ? Avoid eating fast food.  Eat less animal protein, including cheese, meat, poultry, or fish, if told by your dietitian. To do this: ? Limit the number of times you have meat, poultry, fish, or cheese each week. Eat a diet free of meat at least 2 days a week. ? Eat only one serving each day of meat, poultry, fish, or seafood. ? When you prepare animal protein, cut pieces into small portion sizes. For most meat and fish, one serving is about the size of one deck of cards.  Eat at least 5 servings of fresh fruits and vegetables each day. To do this: ? Keep fruits and vegetables on hand for snacks. ? Eat 1 piece of fruit or a handful of berries with breakfast. ? Have a salad and fruit at lunch. ? Have two kinds of vegetables at dinner.  Limit foods that are high in a substance called  oxalate. These include: ? Spinach. ? Rhubarb. ? Beets. ? Potato chips and french fries. ? Nuts.  If you regularly take a diuretic medicine, make sure to eat at least 1-2 fruits or vegetables high in potassium each day. These include: ? Avocado. ? Banana. ? Orange, prune, carrot, or tomato juice. ? Baked potato. ? Cabbage. ? Beans and split peas. General instructions  Drink enough fluid to keep your urine clear or pale yellow. This is the most important thing you can do.  Talk to your health care provider and dietitian about taking daily supplements. Depending on your health and the cause of your kidney stones, you may be advised: ? Not to take supplements with vitamin C. ? To take a calcium supplement. ? To take a daily probiotic supplement. ? To take other supplements such as magnesium, fish oil, or vitamin B6.  Take all medicines and supplements as told by your health care provider.  Limit alcohol intake to  no more than 1 drink a day for nonpregnant women and 2 drinks a day for men. One drink equals 12 oz of beer, 5 oz of wine, or 1 oz of hard liquor.  Lose weight if told by your health care provider. Work with your dietitian to find strategies and an eating plan that works best for you. What foods are not recommended? Limit your intake of the following foods, or as told by your dietitian. Talk to your dietitian about specific foods you should avoid based on the type of kidney stones and your overall health. Grains Breads. Bagels. Rolls. Baked goods. Salted crackers. Cereal. Pasta. Vegetables Spinach. Rhubarb. Beets. Canned vegetables. Angie Fava. Olives. Meats and other protein foods Nuts. Nut butters. Large portions of meat, poultry, or fish. Salted or cured meats. Deli meats. Hot dogs. Sausages. Dairy Cheese. Beverages Regular soft drinks. Regular vegetable juice. Seasonings and other foods Seasoning blends with salt. Salad dressings. Canned soups. Soy sauce. Ketchup.  Barbecue sauce. Canned pasta sauce. Casseroles. Pizza. Lasagna. Frozen meals. Potato chips. [place] fries. Summary  You can reduce your risk of kidney stones by making changes to your diet.  The most important thing you can do is drink enough fluid. You should drink enough fluid to keep your urine clear or pale yellow.  Ask your health care provider or dietitian how much protein from animal sources you should eat each day, and also how much salt and calcium you should have each day. This information is not intended to replace advice given to you by your health care provider. Make sure you discuss any questions you have with your health care provider. Document Released: 01/06/2011 Document Revised: 08/22/2016 Document Reviewed: 08/22/2016 Elsevier Interactive Patient Education  [employer].

## 2018-02-14 NOTE — Progress Notes (Signed)
Patient presents to clinic today c/o daily mild headache (frontal) after sustaining a concussion this past weekend (5 days ago) after hitting his head on the bar in the shower. Notes headache is mild and sometimes resolves but typically comes back. Denies AMS, vision changes, facial pain/injury. Denies dizziness, nausea or vomiting. Has been taking Ibuprofen on occasion which helps. 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   Patient has also brought in a kidney stone that he just passed for stone analysis.   Past Medical History:  Diagnosis Date  . Anxiety   . Chronic headaches   . Clavicular fracture   . History of chickenpox   . Shingles    22 years old.    Current Outpatient Medications on File Prior to Visit  Medication Sig Dispense Refill  . acetaminophen (TYLENOL) 500 MG tablet Take 1,000 mg by mouth every 6 (six) hours as needed for mild pain or headache.    . albuterol (PROAIR HFA) 108 (90 Base) MCG/ACT inhaler Inhale 2 puffs into the lungs every 4 (four) hours as needed for wheezing or shortness of breath. 1 Inhaler 1  . cetirizine (ZYRTEC) 10 MG tablet Take 10 mg by mouth daily.    . clindamycin (CLEOCIN) 300 MG capsule Take 300 mg by mouth 3 (three) times daily.  0  . clonazePAM (KLONOPIN) 0.25 MG disintegrating tablet TAKE 1 TABLET BY MOUTH 2 TIMES A DAY AS NEEDED FOR SEIZURE 10 tablet 0  . EPINEPHrine (AUVI-Q) 0.3 mg/0.3 mL IJ SOAJ injection Use as directed for severe allergic reaction 2 Device 1  . Fluticasone Propionate (XHANCE) 93 MCG/ACT EXHU Place 2 sprays into the nose 2 (two) times daily. 32 mL 3  . ibuprofen (ADVIL,MOTRIN) 800 MG tablet Take 1 tablet (800 mg total) by mouth 3 (three) times daily. 21 tablet 0  . montelukast (SINGULAIR) 10 MG tablet Take 1 tablet (10 mg total) by mouth at bedtime. 30 tablet 5  . naproxen sodium (ALEVE) 220 MG tablet Take 220 mg by mouth daily as needed (for  pain).    . propranolol (INDERAL) 20 MG tablet Take 1 tablet (20 mg total) by mouth 2 (two) times daily. 60 tablet 3   No current facility-administered medications on file prior to visit.     Allergies  Allergen Reactions  . Shellfish Allergy Anaphylaxis  . Hydrocodone Itching    Pt states he feels itchy after taking, but it doesn't preclude him taking hydrocodone if needed    Family History  Problem Relation Age of Onset  . Hypertension Mother   . Diabetes Father        type II  . Migraines Sister   . Diabetes Maternal Grandmother     Social History   Socioeconomic History  . Marital status: Single    Spouse name: Not on file  . Number of children: 0  . Years of education: College  . Highest education level: Not on file  Occupational History  . Occupation: [employer]  . Financial resource strain: Not on file  . Food insecurity:    Worry: Not on file    Inability: Not on file  . Transportation needs:    Medical: Not on file    Non-medical: Not on file  Tobacco Use  . Smoking status: Never Smoker  . Smokeless tobacco: Never Used  Substance and Sexual Activity  .  Alcohol use: Yes    Frequency: Never    Comment: Seldom  . Drug use: Not Currently    Types: Marijuana    Comment: rare use  . Sexual activity: Yes    Partners: Female  Lifestyle  . Physical activity:    Days per week: Not on file    Minutes per session: Not on file  . Stress: Not on file  Relationships  . Social connections:    Talks on phone: Not on file    Gets together: Not on file    Attends religious service: Not on file    Active member of club or organization: Not on file    Attends meetings of clubs or organizations: Not on file    Relationship status: Not on file  Other Topics Concern  . Not on file  Social History Narrative   Lives alone   Caffeine use: 1 cup coffee per day   Right handed    Review of Systems - See HPI.  All other ROS are negative.  BP 124/80    Pulse 81   Temp 98.1 F (36.7 C) (Oral)   Resp 17   Ht 6' 5.25" (1.962 m)   Wt (!) 350 lb 12.8 oz (159.1 kg)   SpO2 97%   BMI 41.33 kg/m   Physical Exam  Constitutional: He is oriented to person, place, and time. He appears well-developed and well-nourished.  HENT:  Head: Normocephalic and atraumatic.  Mouth/Throat: Oropharynx is clear and moist.  Eyes: Pupils are equal, round, and reactive to light. EOM are normal.  Neck: Normal range of motion. Neck supple.  Cardiovascular: Normal rate, regular rhythm and normal heart sounds.  Pulmonary/Chest: Effort normal and breath sounds normal.  Lymphadenopathy:    He has no cervical adenopathy.  Neurological: He is alert and oriented to person, place, and time. He has normal strength. He is not disoriented. No cranial nerve deficit. Coordination and gait normal.  Psychiatric: He has a normal mood and affect. His behavior is normal.  Vitals reviewed.   Recent Results (from the past 2160 hour(s))  TSH     Status: None   Collection Time: 11/27/17  3:51 PM  Result Value Ref Range   TSH 1.02 0.35 - 4.50 uIU/mL  POCT urinalysis dipstick     Status: Normal   Collection Time: 12/05/17  1:51 PM  Result Value Ref Range   Color, UA yellow    Clarity, UA clear    Glucose, UA negative    Bilirubin, UA negative    Ketones, UA negative    Spec Grav, UA 1.025 1.010 - 1.025   Blood, UA negative    pH, UA 5.0 5.0 - 8.0   Protein, UA negative    Urobilinogen, UA 0.2 0.2 or 1.0 E.U./dL   Nitrite, UA negative    Leukocytes, UA Negative Negative   Appearance     Odor    Basic metabolic panel     Status: None   Collection Time: 12/08/17  2:23 PM  Result Value Ref Range   Sodium 139 135 - 145 mmol/L   Potassium 4.3 3.5 - 5.1 mmol/L   Chloride 104 101 - 111 mmol/L   CO2 27 22 - 32 mmol/L   Glucose, Bld 95 65 - 99 mg/dL   BUN 10 6 - 20 mg/dL   Creatinine, Ser 0.76 0.61 - 1.24 mg/dL   Calcium 9.6 8.9 - 10.3 mg/dL   GFR calc non Af Amer >60  >  60 mL/min   GFR calc Af Amer >60 >60 mL/min    Comment: (NOTE) The eGFR has been calculated using the CKD EPI equation. This calculation has not been validated in all clinical situations. eGFR's persistently <60 mL/min signify possible Chronic Kidney Disease.    Anion gap 8 5 - 15    Comment: Performed at [facility], [place] [address] [place], [place] [address]  CBC     Status: None   Collection Time: 12/08/17  2:23 PM  Result Value Ref Range   WBC 7.6 4.0 - 10.5 K/uL   RBC 5.04 4.22 - 5.81 MIL/uL   Hemoglobin 15.4 13.0 - 17.0 g/dL   HCT 46.6 39.0 - 52.0 %   MCV 92.5 78.0 - 100.0 fL   MCH 30.6 26.0 - 34.0 pg   MCHC 33.0 30.0 - 36.0 g/dL   RDW 12.6 11.5 - 15.5 %   Platelets 219 150 - 400 K/uL    Comment: Performed at [facility], [place] [address], [place], [place] [address]  D-dimer, quantitative (not at [facility])     Status: None   Collection Time: 12/08/17  2:23 PM  Result Value Ref Range   D-Dimer, Quant <0.27 0.00 - 0.50 ug/mL-FEU    Comment: (NOTE) At the manufacturer cut-off of 0.50 ug/mL FEU, this assay has been documented to exclude PE with a sensitivity and negative predictive value of 97 to 99%.  At this time, this assay has not been approved by the FDA to exclude DVT/VTE. Results should be correlated with clinical presentation. Performed at [facility], [place] [address] [place], [place] [address]   I-stat troponin, ED     Status: None   Collection Time: 12/08/17  2:36 PM  Result Value Ref Range   Troponin i, poc 0.01 0.00 - 0.08 ng/mL   Comment 3            Comment: Due to the release kinetics of cTnI, a negative result within the first hours of the onset of symptoms does not rule out myocardial infarction with certainty. If myocardial infarction is still suspected, repeat the test at appropriate intervals.   CBC w/Diff     Status: None   Collection Time: 12/14/17  2:48 PM  Result Value Ref Range   WBC  7.5 4.0 - 10.5 K/uL   RBC 4.80 4.22 - 5.81 Mil/uL   Hemoglobin 14.7 13.0 - 17.0 g/dL   HCT 43.2 39.0 - 52.0 %   MCV 90.0 78.0 - 100.0 fl   MCHC 33.9 30.0 - 36.0 g/dL   RDW 12.9 11.5 - 15.5 %   Platelets 199.0 150.0 - 400.0 K/uL   Neutrophils Relative % 64.0 43.0 - 77.0 %   Lymphocytes Relative 27.2 12.0 - 46.0 %   Monocytes Relative 6.0 3.0 - 12.0 %   Eosinophils Relative 2.0 0.0 - 5.0 %   Basophils Relative 0.8 0.0 - 3.0 %   Neutro Abs 4.8 1.4 - 7.7 K/uL   Lymphs Abs 2.0 0.7 - 4.0 K/uL   Monocytes Absolute 0.4 0.1 - 1.0 K/uL   Eosinophils Absolute 0.1 0.0 - 0.7 K/uL   Basophils Absolute 0.1 0.0 - 0.1 K/uL  Comp Met (CMET)     Status: Abnormal   Collection Time: 12/14/17  2:48 PM  Result Value Ref Range   Sodium 139 135 - 145 mEq/L   Potassium 4.1 3.5 - 5.1 mEq/L   Chloride 105 96 - 112 mEq/L  CO2 24 19 - 32 mEq/L   Glucose, Bld 103 (H) 70 - 99 mg/dL   BUN 15 6 - 23 mg/dL   Creatinine, Ser 0.78 0.40 - 1.50 mg/dL   Total Bilirubin 0.5 0.2 - 1.2 mg/dL   Alkaline Phosphatase 60 39 - 117 U/L   AST 12 0 - 37 U/L   ALT 16 0 - 53 U/L   Total Protein 6.9 6.0 - 8.3 g/dL   Albumin 4.5 3.5 - 5.2 g/dL   Calcium 9.3 8.4 - 10.5 mg/dL   GFR 131.72 >60.00 mL/min  Lipase     Status: None   Collection Time: 12/14/17  2:48 PM  Result Value Ref Range   Lipase 18.0 Repeated and verified X2. 11.0 - 59.0 U/L  Exercise Tolerance Test     Status: None   Collection Time: 12/28/17  4:07 PM  Result Value Ref Range   Rest HR 84 bpm   Rest BP 145/64 mmHg   RPE 17    Exercise duration (sec) 23 sec   Percent HR 98 %   Exercise duration (min) 9 min   Estimated workload 10.5 METS   Peak HR 196 bpm   Peak BP 185/47 mmHg   MPHR 198 bpm  Allergens w/Total IgE Area 2     Status: Abnormal   Collection Time: 01/18/18  2:34 PM  Result Value Ref Range   Class Description Comment     Comment:     Levels of Specific IgE       Class  Description of Class     ---------------------------  -----   --------------------                    < 0.10         0         Negative            0.10 -    0.31         0/I       Equivocal/Low            0.32 -    0.55         I         Low            0.56 -    1.40         II        Moderate            1.41 -    3.90         III       High            3.91 -   19.00         IV        Very High           19.01 -  100.00         V         Very High                   >100.00         VI        Very High    IgE (Immunoglobulin E), Serum 226 6 - 495 IU/mL    Comment:               **Please note reference interval change**   D Pteronyssinus IgE 0.21 (A) Class 0/I  kU/L   D Farinae IgE 0.20 (A) Class 0/I kU/L   Cat Dander IgE 0.41 (A) Class I kU/L   Dog Dander IgE 0.35 (A) Class I kU/L   [place] Grass IgE 0.12 (A) Class 0/I kU/L   Timothy Grass IgE <0.10 Class 0 kU/L   Johnson Grass IgE 0.12 (A) Class 0/I kU/L   Cockroach, German IgE 0.94 (A) Class II kU/L   Penicillium Chrysogen IgE <0.10 Class 0 kU/L   Cladosporium Herbarum IgE <0.10 Class 0 kU/L   Aspergillus Fumigatus IgE <0.10 Class 0 kU/L   Alternaria Alternata IgE <0.10 Class 0 kU/L   Maple/Box Elder IgE 0.10 (A) Class 0/I kU/L   Common Silver Wendee Copp IgE 0.18 (A) Class 0/I kU/L   Cedar, [place] IgE <0.10 Class 0 kU/L   Oak, White IgE 0.12 (A) Class 0/I kU/L   Elm, American IgE 0.17 (A) Class 0/I kU/L   Cottonwood IgE 0.13 (A) Class 0/I kU/L   Pecan, Hickory IgE 0.10 (A) Class 0/I kU/L   White Mulberry IgE <0.10 Class 0 kU/L   Ragweed, Short IgE 0.11 (A) Class 0/I kU/L   Pigweed, Rough IgE <0.10 Class 0 kU/L   Sheep Sorrel IgE Qn 0.12 (A) Class 0/I kU/L   Mouse Urine IgE <0.10 Class 0 kU/L  Jalapeno Pepper, IgE     Status: None   Collection Time: 01/18/18  2:34 PM  Result Value Ref Range   Jalapeno Pepper <0.35 <0.35 kU/L   Class Interpretation 0     Comment: This conventional EIA uses allergen-coated discs from several suppliers and an enzyme-labeled anti-IgE. CLASS INTERPRETATION:  <0.35 kU/L=0, Below Detection; 0.35-0.69 kU/L= 1, Low Positive; 0.70-3.49 kU/L= 2, Moderate Positive; 3.50-17.49 kU/L= 3, Positive; 17.50-49.99 kU/L= 4, Strong Positive; 50.00-99.99 kU/L= 5, Very Strong Positive; >99.99 kU/L= 6, Very Strong Positive *This test was developed and its performance characteristics determined by [employer]. It has not been cleared or approved by the U.S. Food and Drug Administration.   Allergen Profile, Shellfish     Status: Abnormal   Collection Time: 01/18/18  2:34 PM  Result Value Ref Range   Class Description Comment     Comment:     Levels of Specific IgE       Class  Description of Class     ---------------------------  -----  --------------------                    < 0.10         0         Negative            0.10 -    0.31         0/I       Equivocal/Low            0.32 -    0.55         I         Low            0.56 -    1.40         II        Moderate            1.41 -    3.90         III       High            3.91 -   19.00  IV        Very High           19.01 -  100.00         V         Very High                   >100.00         VI        Very High    Clam IgE 0.35 (A) Class I kU/L   F023-IgE Crab <0.10 Class 0 kU/L   Shrimp IgE 1.26 (A) Class II kU/L   Scallop IgE 0.73 (A) Class II kU/L   F290-IgE Oyster <0.10 Class 0 kU/L   F080-IgE Lobster <0.10 Class 0 kU/L  Specimen status report     Status: None   Collection Time: 01/18/18  2:34 PM  Result Value Ref Range   specimen status report Comment     Comment: Written Authorization Written Authorization No Written Authorization Received.   POCT rapid strep A     Status: Normal   Collection Time: 02/05/18  1:27 PM  Result Value Ref Range   Rapid Strep A Screen Negative Negative  WOUND CULTURE     Status: None   Collection Time: 02/05/18  1:27 PM  Result Value Ref Range   MICRO NUMBER: [id number]    SPECIMEN QUALITY: ADEQUATE    SOURCE: WOUND (SITE NOT SPECIFIED)     STATUS: FINAL    GRAM STAIN:      Rare White blood cells seen No epithelial cells seen Few Gram positive cocci in pairs   RESULT:      Growth of skin flora (note: Growth does not include S. aureus, beta-hemolytic Streptococci or P. aeruginosa).  Culture, Group A Strep     Status: None   Collection Time: 02/05/18  1:29 PM  Result Value Ref Range   MICRO NUMBER: [id number]    SPECIMEN QUALITY: ADEQUATE    SOURCE: TONSILS    STATUS: FINAL    RESULT: No group A Streptococcus isolated   Urinalysis, Routine w reflex microscopic- may I&O cath if menses     Status: Abnormal   Collection Time: 02/12/18  9:53 PM  Result Value Ref Range   Color, Urine YELLOW YELLOW   APPearance CLEAR CLEAR   Specific Gravity, Urine 1.013 1.005 - 1.030   pH 6.0 5.0 - 8.0   Glucose, UA NEGATIVE NEGATIVE mg/dL   Hgb urine dipstick LARGE (A) NEGATIVE   Bilirubin Urine NEGATIVE NEGATIVE   Ketones, ur NEGATIVE NEGATIVE mg/dL   Protein, ur NEGATIVE NEGATIVE mg/dL   Nitrite NEGATIVE NEGATIVE   Leukocytes, UA NEGATIVE NEGATIVE   RBC / HPF 11-20 0 - 5 RBC/hpf   WBC, UA 0-5 0 - 5 WBC/hpf   Bacteria, UA NONE SEEN NONE SEEN   Mucus PRESENT     Comment: Performed at [facility], [address] [place], [place] [address]    Assessment/Plan: 1. Calculus of urinary bladder Passed successfully. Will send for stone analysis. Dietary recommendations reviewed. Will tailor diet further based on analysis.   2. Postconcussive syndrome Discussed typical course. Symptoms seem mild but likely exacerbated due to exposure to computer screens all day and having to do mentally strenuous activity. Discussed supportive measures and OTC medications for headache relief. He is 5 days out with no alarm symptoms/signs. No indication for imaging presently.    Leeanne Rio, PA-C

## 2018-02-17 LAB — STONE ANALYSIS: STONE WEIGHT: 0.007 g

## 2018-02-19 ENCOUNTER — Encounter: Payer: Self-pay | Admitting: Physician Assistant

## 2018-02-19 ENCOUNTER — Other Ambulatory Visit: Payer: 59

## 2018-02-19 DIAGNOSIS — R197 Diarrhea, unspecified: Secondary | ICD-10-CM

## 2018-02-20 ENCOUNTER — Ambulatory Visit: Payer: 59 | Admitting: Physician Assistant

## 2018-02-22 ENCOUNTER — Ambulatory Visit (INDEPENDENT_AMBULATORY_CARE_PROVIDER_SITE_OTHER): Payer: 59

## 2018-02-22 DIAGNOSIS — J309 Allergic rhinitis, unspecified: Secondary | ICD-10-CM

## 2018-02-23 LAB — STOOL CULTURE
MICRO NUMBER: 90643782
MICRO NUMBER: 90643783
MICRO NUMBER:: 90643784
SHIGA RESULT:: NOT DETECTED
SPECIMEN QUALITY: ADEQUATE
SPECIMEN QUALITY:: ADEQUATE
SPECIMEN QUALITY:: ADEQUATE

## 2018-02-23 LAB — TIQ-NTM

## 2018-02-23 LAB — CLOSTRIDIUM DIFFICILE TOXIN B, QUALITATIVE, REAL-TIME PCR: Toxigenic C. Difficile by PCR: NOT DETECTED

## 2018-02-24 ENCOUNTER — Other Ambulatory Visit: Payer: Self-pay | Admitting: Physician Assistant

## 2018-02-24 DIAGNOSIS — F419 Anxiety disorder, unspecified: Secondary | ICD-10-CM

## 2018-02-27 ENCOUNTER — Ambulatory Visit (INDEPENDENT_AMBULATORY_CARE_PROVIDER_SITE_OTHER): Payer: 59 | Admitting: *Deleted

## 2018-02-27 DIAGNOSIS — J309 Allergic rhinitis, unspecified: Secondary | ICD-10-CM

## 2018-03-07 ENCOUNTER — Encounter: Payer: Self-pay | Admitting: Physician Assistant

## 2018-03-07 ENCOUNTER — Ambulatory Visit (INDEPENDENT_AMBULATORY_CARE_PROVIDER_SITE_OTHER): Payer: 59

## 2018-03-07 DIAGNOSIS — J309 Allergic rhinitis, unspecified: Secondary | ICD-10-CM

## 2018-03-10 ENCOUNTER — Other Ambulatory Visit: Payer: Self-pay | Admitting: Allergy

## 2018-03-14 ENCOUNTER — Ambulatory Visit (INDEPENDENT_AMBULATORY_CARE_PROVIDER_SITE_OTHER): Payer: 59 | Admitting: *Deleted

## 2018-03-14 DIAGNOSIS — J309 Allergic rhinitis, unspecified: Secondary | ICD-10-CM

## 2018-03-21 ENCOUNTER — Ambulatory Visit (INDEPENDENT_AMBULATORY_CARE_PROVIDER_SITE_OTHER): Payer: 59

## 2018-03-21 DIAGNOSIS — J309 Allergic rhinitis, unspecified: Secondary | ICD-10-CM

## 2018-03-22 ENCOUNTER — Other Ambulatory Visit: Payer: Self-pay | Admitting: Neurology

## 2018-03-22 ENCOUNTER — Encounter: Payer: Self-pay | Admitting: Physician Assistant

## 2018-03-26 ENCOUNTER — Ambulatory Visit (INDEPENDENT_AMBULATORY_CARE_PROVIDER_SITE_OTHER): Payer: 59 | Admitting: *Deleted

## 2018-03-26 DIAGNOSIS — J309 Allergic rhinitis, unspecified: Secondary | ICD-10-CM

## 2018-03-27 ENCOUNTER — Ambulatory Visit: Payer: 59 | Admitting: Physician Assistant

## 2018-03-27 ENCOUNTER — Other Ambulatory Visit: Payer: Self-pay

## 2018-03-27 ENCOUNTER — Encounter: Payer: Self-pay | Admitting: Physician Assistant

## 2018-03-27 VITALS — BP 110/70 | HR 74 | Temp 98.1°F | Resp 16 | Ht 77.25 in | Wt 359.0 lb

## 2018-03-27 DIAGNOSIS — S90861A Insect bite (nonvenomous), right foot, initial encounter: Secondary | ICD-10-CM | POA: Diagnosis not present

## 2018-03-27 DIAGNOSIS — W57XXXA Bitten or stung by nonvenomous insect and other nonvenomous arthropods, initial encounter: Secondary | ICD-10-CM | POA: Diagnosis not present

## 2018-03-27 NOTE — Patient Instructions (Signed)
Please continue your Doxycycline given by the Dermatologist for other issues. This protects against tick-borne illness. Keep skin clean and dry. Avoid scratching at the area. You can put a small amount of topical Neosporin on the other. Symptom should slowly resolve!

## 2018-03-27 NOTE — Progress Notes (Signed)
Patient presents to clinic today c/o possible tick/insect bite to R medial ankle occurring yesterday. States he felt something moving on his leg and absentmindedly scratched the leg good before looking. Noted a tick crawling up the lower leg and removed immediately. Has noted a linear red line at site of scratching and a red bump at site of potential bite. Notes it is itching somewhat but is not painful. Denies fever, chills, malaise or fatigue. Is currently on Doxycycline 100 mg BID for a skin issue of scrotum.   Past Medical History:  Diagnosis Date  . Anxiety   . Chronic headaches   . Clavicular fracture   . History of chickenpox   . Shingles    22 years old.    Current Outpatient Medications on File Prior to Visit  Medication Sig Dispense Refill  . acetaminophen (TYLENOL) 500 MG tablet Take 1,000 mg by mouth every 6 (six) hours as needed for mild pain or headache.    . cetirizine (ZYRTEC) 10 MG tablet Take 10 mg by mouth daily.    . clindamycin (CLEOCIN) 300 MG capsule Take 300 mg by mouth 3 (three) times daily.  0  . clonazePAM (KLONOPIN) 0.25 MG disintegrating tablet TAKE 1 TABLET BY MOUTH 2 TIMES A DAY AS NEEDED FOR SEIZURE 10 tablet 0  . EPINEPHrine (AUVI-Q) 0.3 mg/0.3 mL IJ SOAJ injection Use as directed for severe allergic reaction 2 Device 1  . Fluticasone Propionate (XHANCE) 93 MCG/ACT EXHU Place 2 sprays into the nose 2 (two) times daily. 32 mL 3  . ibuprofen (ADVIL,MOTRIN) 800 MG tablet Take 1 tablet (800 mg total) by mouth 3 (three) times daily. 21 tablet 0  . montelukast (SINGULAIR) 10 MG tablet Take 1 tablet (10 mg total) by mouth at bedtime. 30 tablet 5  . naproxen sodium (ALEVE) 220 MG tablet Take 220 mg by mouth daily as needed (for pain).    Marland Kitchen PROAIR HFA 108 (90 Base) MCG/ACT inhaler INHALE 2 PUFFS INTO THE LUNGS EVERY 4 (FOUR) HOURS AS NEEDED FOR WHEEZING OR SHORTNESS OF BREATH. 8.5 Inhaler 1  . propranolol (INDERAL) 20 MG tablet TAKE 1 TABLET BY MOUTH TWICE A DAY 60  tablet 3   No current facility-administered medications on file prior to visit.     Allergies  Allergen Reactions  . Shellfish Allergy Anaphylaxis  . Hydrocodone Itching    Pt states he feels itchy after taking, but it doesn't preclude him taking hydrocodone if needed    Family History  Problem Relation Age of Onset  . Hypertension Mother   . Diabetes Father        type II  . Migraines Sister   . Diabetes Maternal Grandmother     Social History   Socioeconomic History  . Marital status: Single    Spouse name: Not on file  . Number of children: 0  . Years of education: College  . Highest education level: Not on file  Occupational History  . Occupation: [employer]  . Financial resource strain: Not on file  . Food insecurity:    Worry: Not on file    Inability: Not on file  . Transportation needs:    Medical: Not on file    Non-medical: Not on file  Tobacco Use  . Smoking status: Never Smoker  . Smokeless tobacco: Never Used  Substance and Sexual Activity  . Alcohol use: Yes    Frequency: Never    Comment: Seldom  . Drug use:  Not Currently    Types: Marijuana    Comment: rare use  . Sexual activity: Yes    Partners: Female  Lifestyle  . Physical activity:    Days per week: Not on file    Minutes per session: Not on file  . Stress: Not on file  Relationships  . Social connections:    Talks on phone: Not on file    Gets together: Not on file    Attends religious service: Not on file    Active member of club or organization: Not on file    Attends meetings of clubs or organizations: Not on file    Relationship status: Not on file  Other Topics Concern  . Not on file  Social History Narrative   Lives alone   Caffeine use: 1 cup coffee per day   Right handed    Review of Systems - See HPI.  All other ROS are negative.  BP 110/70   Pulse 74   Temp 98.1 F (36.7 C) (Oral)   Resp 16   Ht 6' 5.25" (1.962 m)   Wt (!) 359 lb (162.8 kg)    SpO2 99%   BMI 42.30 kg/m   Physical Exam  Constitutional: He appears well-developed and well-nourished.  HENT:  Head: Normocephalic and atraumatic.  Eyes: Conjunctivae are normal.  Pulmonary/Chest: Effort normal.  Musculoskeletal:       Feet:  Vitals reviewed.   Recent Results (from the past 2160 hour(s))  Exercise Tolerance Test     Status: None   Collection Time: 12/28/17  4:07 PM  Result Value Ref Range   Rest HR 84 bpm   Rest BP 145/64 mmHg   RPE 17    Exercise duration (sec) 23 sec   Percent HR 98 %   Exercise duration (min) 9 min   Estimated workload 10.5 METS   Peak HR 196 bpm   Peak BP 185/47 mmHg   MPHR 198 bpm  Allergens w/Total IgE Area 2     Status: Abnormal   Collection Time: 01/18/18  2:34 PM  Result Value Ref Range   Class Description Comment     Comment:     Levels of Specific IgE       Class  Description of Class     ---------------------------  -----  --------------------                    < 0.10         0         Negative            0.10 -    0.31         0/I       Equivocal/Low            0.32 -    0.55         I         Low            0.56 -    1.40         II        Moderate            1.41 -    3.90         III       High            3.91 -   19.00         IV  Very High           19.01 -  100.00         V         Very High                   >100.00         VI        Very High    IgE (Immunoglobulin E), Serum 226 6 - 495 IU/mL    Comment:               **Please note reference interval change**   D Pteronyssinus IgE 0.21 (A) Class 0/I kU/L   D Farinae IgE 0.20 (A) Class 0/I kU/L   Cat Dander IgE 0.41 (A) Class I kU/L   Dog Dander IgE 0.35 (A) Class I kU/L   [place] Grass IgE 0.12 (A) Class 0/I kU/L   Timothy Grass IgE <0.10 Class 0 kU/L   Johnson Grass IgE 0.12 (A) Class 0/I kU/L   Cockroach, German IgE 0.94 (A) Class II kU/L   Penicillium Chrysogen IgE <0.10 Class 0 kU/L   Cladosporium Herbarum IgE <0.10 Class 0 kU/L    Aspergillus Fumigatus IgE <0.10 Class 0 kU/L   Alternaria Alternata IgE <0.10 Class 0 kU/L   Maple/Box Elder IgE 0.10 (A) Class 0/I kU/L   Common Silver Wendee Copp IgE 0.18 (A) Class 0/I kU/L   Cedar, [place] IgE <0.10 Class 0 kU/L   Oak, White IgE 0.12 (A) Class 0/I kU/L   Elm, American IgE 0.17 (A) Class 0/I kU/L   Cottonwood IgE 0.13 (A) Class 0/I kU/L   Pecan, Hickory IgE 0.10 (A) Class 0/I kU/L   White Mulberry IgE <0.10 Class 0 kU/L   Ragweed, Short IgE 0.11 (A) Class 0/I kU/L   Pigweed, Rough IgE <0.10 Class 0 kU/L   Sheep Sorrel IgE Qn 0.12 (A) Class 0/I kU/L   Mouse Urine IgE <0.10 Class 0 kU/L  Jalapeno Pepper, IgE     Status: None   Collection Time: 01/18/18  2:34 PM  Result Value Ref Range   Jalapeno Pepper <0.35 <0.35 kU/L   Class Interpretation 0     Comment: This conventional EIA uses allergen-coated discs from several suppliers and an enzyme-labeled anti-IgE. CLASS INTERPRETATION: <0.35 kU/L=0, Below Detection; 0.35-0.69 kU/L= 1, Low Positive; 0.70-3.49 kU/L= 2, Moderate Positive; 3.50-17.49 kU/L= 3, Positive; 17.50-49.99 kU/L= 4, Strong Positive; 50.00-99.99 kU/L= 5, Very Strong Positive; >99.99 kU/L= 6, Very Strong Positive *This test was developed and its performance characteristics determined by [employer]. It has not been cleared or approved by the U.S. Food and Drug Administration.   Allergen Profile, Shellfish     Status: Abnormal   Collection Time: 01/18/18  2:34 PM  Result Value Ref Range   Class Description Comment     Comment:     Levels of Specific IgE       Class  Description of Class     ---------------------------  -----  --------------------                    < 0.10         0         Negative            0.10 -    0.31         0/I       Equivocal/Low  0.32 -    0.55         I         Low            0.56 -    1.40         II        Moderate            1.41 -    3.90         III       High            3.91 -   19.00         IV         Very High           19.01 -  100.00         V         Very High                   >100.00         VI        Very High    Clam IgE 0.35 (A) Class I kU/L   F023-IgE Crab <0.10 Class 0 kU/L   Shrimp IgE 1.26 (A) Class II kU/L   Scallop IgE 0.73 (A) Class II kU/L   F290-IgE Oyster <0.10 Class 0 kU/L   F080-IgE Lobster <0.10 Class 0 kU/L  Specimen status report     Status: None   Collection Time: 01/18/18  2:34 PM  Result Value Ref Range   specimen status report Comment     Comment: Written Authorization Written Authorization No Written Authorization Received.   POCT rapid strep A     Status: Normal   Collection Time: 02/05/18  1:27 PM  Result Value Ref Range   Rapid Strep A Screen Negative Negative  WOUND CULTURE     Status: None   Collection Time: 02/05/18  1:27 PM  Result Value Ref Range   MICRO NUMBER: [id number]    SPECIMEN QUALITY: ADEQUATE    SOURCE: WOUND (SITE NOT SPECIFIED)    STATUS: FINAL    GRAM STAIN:      Rare White blood cells seen No epithelial cells seen Few Gram positive cocci in pairs   RESULT:      Growth of skin flora (note: Growth does not include S. aureus, beta-hemolytic Streptococci or P. aeruginosa).  Culture, Group A Strep     Status: None   Collection Time: 02/05/18  1:29 PM  Result Value Ref Range   MICRO NUMBER: [id number]    SPECIMEN QUALITY: ADEQUATE    SOURCE: TONSILS    STATUS: FINAL    RESULT: No group A Streptococcus isolated   Urinalysis, Routine w reflex microscopic- may I&O cath if menses     Status: Abnormal   Collection Time: 02/12/18  9:53 PM  Result Value Ref Range   Color, Urine YELLOW YELLOW   APPearance CLEAR CLEAR   Specific Gravity, Urine 1.013 1.005 - 1.030   pH 6.0 5.0 - 8.0   Glucose, UA NEGATIVE NEGATIVE mg/dL   Hgb urine dipstick LARGE (A) NEGATIVE   Bilirubin Urine NEGATIVE NEGATIVE   Ketones, ur NEGATIVE NEGATIVE mg/dL   Protein, ur NEGATIVE NEGATIVE mg/dL   Nitrite NEGATIVE NEGATIVE   Leukocytes, UA NEGATIVE  NEGATIVE   RBC / HPF 11-20 0 - 5 RBC/hpf   WBC, UA 0-5 0 - 5 WBC/hpf   Bacteria, UA  NONE SEEN NONE SEEN   Mucus PRESENT     Comment: Performed at [facility], [place] [address], [place], [place] [address]  Joaquim Lai analysis     Status: None   Collection Time: 02/14/18 11:21 AM  Result Value Ref Range   Nidus Not observed    Component 1 See Below     Comment: Calcium Oxalate Dihydrate (Weddellite) 30% Calcium Oxalate Monohydrate (Whewellite) 35% Carbonate Apatite (Dahllite) 35%    Stone Weight 0.0070 g    Comment:  This test was developed and its analytical performance  characteristics have been determined by [facility]. It has not been cleared or approved by the [place] Food and Drug Administration. This assay has been validated  pursuant to the CLIA regulations and is used for clinical  purposes.   Clostridium difficile Toxin B, Qualitative, Real-Time PCR([facility])     Status: None   Collection Time: 02/19/18  1:41 PM  Result Value Ref Range   Toxigenic C. Difficile by PCR NOT DETECTED NOT DETECT    Comment: . This test is for use only with liquid or soft stools;  performance characteristics of other clinical specimen  types have not been established. . This assay was performed by Cepheid GeneXpert(R) PCR. The performance characteristics of this assay have been determined by [employer]. Performance characteristics refer to the analytical performance of the test. . For additional information, please refer to http://education.[facility].com/faq/FAQ136 (This link is being provided for informational/educational purposes only.)   Stool Culture     Status: None   Collection Time: 02/19/18  1:41 PM  Result Value Ref Range   MICRO NUMBER: [id number]    SPECIMEN QUALITY: ADEQUATE    SOURCE: STOOL    STATUS: FINAL    SHIGA RESULT: Not Detected    MICRO NUMBER: [id number]    SPECIMEN QUALITY: ADEQUATE    Source STOOL    STATUS: FINAL     CAM RESULT: No enteric Campylobacter isolated    MICRO NUMBER: [id number]    SPECIMEN QUALITY: ADEQUATE    SOURCE: STOOL    STATUS: FINAL    SS RESULT: No Salmonella or Shigella isolated   TIQ-NTM     Status: None   Collection Time: 02/19/18  1:41 PM  Result Value Ref Range   QUESTION/PROBLEM:      Comment: . No test(s) are indicated on the requisition for the following specimen(s). .    SPECIMEN(S) RECEIVED: TF     Comment: REQUESTED INFORMATION _________________________________ . AUTHORIZED SIGNATURE __________________________________ . TO PREVENT FURTHER DELAYS IN TESTING, PLEASE COMPLETE INFORMATION ABOVE AND FAX TO [phone] TO RESOLVE  THIS ORDER.     Assessment/Plan: 1. Tick bite of right foot, initial encounter Small area of questionable bite noted of medial R ankle. There is redness without induration or drainage. There is linear excoriation noted where he scratched and broke the skin. No sign of cellulitis. Continue current Doxy Rx. Supportive measures reviewed. Nothing further needed presently.    Leeanne Rio, PA-C

## 2018-04-01 ENCOUNTER — Telehealth: Payer: Self-pay | Admitting: Neurology

## 2018-04-01 ENCOUNTER — Other Ambulatory Visit: Payer: Self-pay | Admitting: Physician Assistant

## 2018-04-01 DIAGNOSIS — F419 Anxiety disorder, unspecified: Secondary | ICD-10-CM

## 2018-04-01 MED ORDER — CLONAZEPAM 0.25 MG PO TBDP: 0.2500 mg | ORAL_TABLET | Freq: Two times a day (BID) | ORAL | 0 refills | Status: DC | PRN

## 2018-04-01 NOTE — Telephone Encounter (Signed)
MR Brain w/wo contrast Dr. Jannifer Franklin [facility] Auth: [id number] (exp. 04/01/18 to 05/16/18. Patient rescheduled MRI from 04/30/18 to 04/03/18.

## 2018-04-01 NOTE — Telephone Encounter (Signed)
Last OV 02/14/18, No future OV  Last filled 02/25/18, # 10 with 0 refills

## 2018-04-02 ENCOUNTER — Encounter: Payer: Self-pay | Admitting: Physician Assistant

## 2018-04-02 ENCOUNTER — Ambulatory Visit (INDEPENDENT_AMBULATORY_CARE_PROVIDER_SITE_OTHER): Payer: 59

## 2018-04-02 ENCOUNTER — Encounter: Payer: Self-pay | Admitting: Neurology

## 2018-04-02 ENCOUNTER — Telehealth: Payer: Self-pay | Admitting: *Deleted

## 2018-04-02 DIAGNOSIS — R9089 Other abnormal findings on diagnostic imaging of central nervous system: Secondary | ICD-10-CM

## 2018-04-02 DIAGNOSIS — G441 Vascular headache, not elsewhere classified: Secondary | ICD-10-CM

## 2018-04-02 MED ORDER — GADOPENTETATE DIMEGLUMINE 469.01 MG/ML IV SOLN
20.0000 mL | Freq: Once | INTRAVENOUS | Status: AC | PRN
  Administered 2018-04-02: 20 mL via INTRAVENOUS

## 2018-04-02 NOTE — Telephone Encounter (Signed)
Pt requested to speak with me to assess IV site after MRI this am. States they stuck him in left forearm but unable to access. Was able to get IV in right forearm. Stung when IV dye went in initially. IV site minimally red. No drainage or swelling. Denies pain.  I advised pt to continue to monitor. If redness worsened, he had any pain or swelling to contact our office. He was stable leaving office.

## 2018-04-02 NOTE — Telephone Encounter (Signed)
No further recommendations, thanks.

## 2018-04-02 NOTE — Telephone Encounter (Signed)
Patient called office back stating right arm now has hard spot where IV was placed and dye injected. He states redness has gone down. Denies pain, blisters, warmth, swelling. I recommended using ice packs to the area. Advised him to f/u with ED if he starts have severe reaction. He is concerned because he read online reaction can sometimes occur a week later. I advised him to continue to monitor sx. If sx worsen, advised him to let us know.

## 2018-04-03 ENCOUNTER — Other Ambulatory Visit: Payer: 59

## 2018-04-05 ENCOUNTER — Telehealth: Payer: Self-pay | Admitting: Neurology

## 2018-04-05 ENCOUNTER — Ambulatory Visit (INDEPENDENT_AMBULATORY_CARE_PROVIDER_SITE_OTHER): Payer: 59

## 2018-04-05 ENCOUNTER — Encounter: Payer: Self-pay | Admitting: Neurology

## 2018-04-05 DIAGNOSIS — J309 Allergic rhinitis, unspecified: Secondary | ICD-10-CM

## 2018-04-05 NOTE — Telephone Encounter (Signed)
Called pt. Relayed results per Dr. Jaynee Eagles message. Her verbalized understanding. He states he is feeling better. Per Dr. Jannifer Franklin' last note he wanted him to f/u if sx worsen or do not improve. He will f/u prn. He knows message sent to Dr. Jannifer Franklin for review once he returns. Nothing further needed.   He states right arm bruised from IV after MRI. He states there are three lines next to bruise. Wondering if he should be concerned. I asked him to send picture via mychart for [place] to see. I will speak to [facility] if needed and advise him how to proceed. He verbalized understanding and appreciation.

## 2018-04-05 NOTE — Telephone Encounter (Signed)
Bradley [name], Previously noted left frontal enhancing lesion is no longer seen. Otherwise the MRI is stable. Please let patient know and leave MRI in Dr. Jannifer Franklin' inbox so that he can also review and discuss any next steps with patient; a vascular malformation was discussed but given resolution I think Dr. Jannifer Franklin needs to re-evaluate when he gets back.    MRI brain: No abnormal lesions are seen on diffusion-weighted views to suggest acute ischemia. The cortical sulci, fissures and cisterns are normal in size and appearance. Lateral, third and fourth ventricle are normal in size and appearance. No extra-axial fluid collections are seen. No evidence of mass effect or midline shift.    Small right parietal, periventricular T2 hyperintensity.  No abnormal lesions are seen on post contrast views.    On sagittal views the posterior fossa, pituitary gland and corpus callosum are unremarkable. No evidence of intracranial hemorrhage on gradient-echo views. The orbits and their contents, paranasal sinuses and calvarium are unremarkable.  Intracranial flow voids are present.   IMPRESSION:   MRI brain (with and without) demonstrating: - Stable non-specific right parietal, periventricular T2 hyperintensity. - Previously noted left frontal enhancing lesion is no longer seen.

## 2018-04-09 ENCOUNTER — Ambulatory Visit (INDEPENDENT_AMBULATORY_CARE_PROVIDER_SITE_OTHER): Payer: 59 | Admitting: *Deleted

## 2018-04-09 DIAGNOSIS — J309 Allergic rhinitis, unspecified: Secondary | ICD-10-CM

## 2018-04-10 ENCOUNTER — Encounter: Payer: Self-pay | Admitting: Neurology

## 2018-04-11 ENCOUNTER — Ambulatory Visit (INDEPENDENT_AMBULATORY_CARE_PROVIDER_SITE_OTHER): Payer: 59 | Admitting: *Deleted

## 2018-04-11 DIAGNOSIS — J309 Allergic rhinitis, unspecified: Secondary | ICD-10-CM

## 2018-04-12 ENCOUNTER — Encounter: Payer: Self-pay | Admitting: Physician Assistant

## 2018-04-14 NOTE — Telephone Encounter (Signed)
I called the patient.  The MRI of the brain showed resolution of the cortical abnormality that was there before.  This likely means that the lesion was not a venous abnormality as previously thought, this could have been a transient cortical abnormality associated with a migraine.  I will wait 6 months, repeat the scan.  I discussed this with the patient.

## 2018-04-15 ENCOUNTER — Other Ambulatory Visit: Payer: Self-pay

## 2018-04-15 ENCOUNTER — Telehealth: Payer: Self-pay

## 2018-04-15 ENCOUNTER — Ambulatory Visit (INDEPENDENT_AMBULATORY_CARE_PROVIDER_SITE_OTHER): Payer: 59 | Admitting: *Deleted

## 2018-04-15 DIAGNOSIS — J309 Allergic rhinitis, unspecified: Secondary | ICD-10-CM | POA: Diagnosis not present

## 2018-04-15 MED ORDER — ALBUTEROL SULFATE (2.5 MG/3ML) 0.083% IN NEBU: 2.5000 mg | INHALATION_SOLUTION | Freq: Four times a day (QID) | RESPIRATORY_TRACT | 1 refills | Status: DC | PRN

## 2018-04-15 NOTE — Telephone Encounter (Signed)
Good Afternoon,    My asthma has been really bad today. The inhaler does help, but I wanted to ask if a breathing treatment machine would help more.

## 2018-04-15 NOTE — Telephone Encounter (Signed)
Informed pt of what dr Nelva Bush has stated, he does want a nebulizer. I went ahead and sent in the albuterol for him to his pharmacy and talked to [place] f. At the [facility] office about putting aside the nebulizer for pt to pick up.

## 2018-04-15 NOTE — Telephone Encounter (Signed)
Lm for pt to call us back  

## 2018-04-15 NOTE — Telephone Encounter (Signed)
It may however the medication is the same in both the inhaler and nebulizer.  If he would like a nebulizer we can provide with one and he would need to pick up the machine in office.

## 2018-04-18 ENCOUNTER — Ambulatory Visit (INDEPENDENT_AMBULATORY_CARE_PROVIDER_SITE_OTHER): Payer: 59 | Admitting: *Deleted

## 2018-04-18 DIAGNOSIS — J309 Allergic rhinitis, unspecified: Secondary | ICD-10-CM

## 2018-04-22 ENCOUNTER — Ambulatory Visit (INDEPENDENT_AMBULATORY_CARE_PROVIDER_SITE_OTHER): Payer: 59

## 2018-04-22 ENCOUNTER — Encounter: Payer: Self-pay | Admitting: Neurology

## 2018-04-22 DIAGNOSIS — J309 Allergic rhinitis, unspecified: Secondary | ICD-10-CM

## 2018-04-24 ENCOUNTER — Encounter: Payer: Self-pay | Admitting: Physician Assistant

## 2018-04-29 ENCOUNTER — Ambulatory Visit (INDEPENDENT_AMBULATORY_CARE_PROVIDER_SITE_OTHER): Payer: 59 | Admitting: *Deleted

## 2018-04-29 DIAGNOSIS — J309 Allergic rhinitis, unspecified: Secondary | ICD-10-CM

## 2018-04-30 ENCOUNTER — Other Ambulatory Visit: Payer: 59

## 2018-05-01 ENCOUNTER — Other Ambulatory Visit: Payer: Self-pay | Admitting: Physician Assistant

## 2018-05-01 DIAGNOSIS — F419 Anxiety disorder, unspecified: Secondary | ICD-10-CM

## 2018-05-02 ENCOUNTER — Ambulatory Visit (INDEPENDENT_AMBULATORY_CARE_PROVIDER_SITE_OTHER): Payer: 59 | Admitting: *Deleted

## 2018-05-02 DIAGNOSIS — J309 Allergic rhinitis, unspecified: Secondary | ICD-10-CM | POA: Diagnosis not present

## 2018-05-06 ENCOUNTER — Encounter: Payer: Self-pay | Admitting: Physician Assistant

## 2018-05-06 ENCOUNTER — Ambulatory Visit (INDEPENDENT_AMBULATORY_CARE_PROVIDER_SITE_OTHER): Payer: 59

## 2018-05-06 DIAGNOSIS — J309 Allergic rhinitis, unspecified: Secondary | ICD-10-CM

## 2018-05-07 ENCOUNTER — Encounter: Payer: Self-pay | Admitting: Physician Assistant

## 2018-05-07 ENCOUNTER — Other Ambulatory Visit: Payer: Self-pay

## 2018-05-07 ENCOUNTER — Ambulatory Visit (INDEPENDENT_AMBULATORY_CARE_PROVIDER_SITE_OTHER): Payer: 59 | Admitting: Physician Assistant

## 2018-05-07 VITALS — BP 116/76 | HR 66 | Temp 98.1°F | Resp 16 | Ht 77.25 in | Wt 361.6 lb

## 2018-05-07 DIAGNOSIS — K219 Gastro-esophageal reflux disease without esophagitis: Secondary | ICD-10-CM | POA: Diagnosis not present

## 2018-05-07 NOTE — Patient Instructions (Signed)
Please resume your Prilosec taking daily for 1-2 weeks. You can continue the Zantac for another day until the Prilosec starts taking affect. Limit late night eating. Follow-up dietary recommendations below.  Work on [employer] as discussed. We want a diet low in sodium and saturated fats. Try to make 1/2 plate non-starchy vegetables, 1/4 plate starchy vegetables or whole grains, and 1/4 plate a lean protein. Work on increased aerobic exercise to a goal of 150 minutes per week for now. Follow-up in 3-4 weeks for reassessment.

## 2018-05-08 NOTE — Progress Notes (Signed)
Patient presents to clinic today c/o epigastric pain and indigestion starting again a week or so ago after starting an exercise regimen. Has not changed diet. Denies SOB, palpitations, dizziness. Has had a negative cardiac workup and + history of GERD. Is not currently on regular medication for this.  Did restart Prilosec this morning and noting improvement.  Past Medical History:  Diagnosis Date  . Anxiety   . Chronic headaches   . Clavicular fracture   . History of chickenpox   . Shingles    22 years old.    Current Outpatient Medications on File Prior to Visit  Medication Sig Dispense Refill  . albuterol (PROVENTIL) (2.5 MG/3ML) 0.083% nebulizer solution Take 3 mLs (2.5 mg total) by nebulization every 6 (six) hours as needed for wheezing or shortness of breath. 75 mL 1  . clindamycin (CLEOCIN T) 1 % lotion APPLY TO AFFECTED AREA UP TO TWICE DAILY AS NEEDED    . clonazePAM (KLONOPIN) 0.25 MG disintegrating tablet Take 1 tablet (0.25 mg total) by mouth 2 (two) times daily as needed. 10 tablet 0  . EPINEPHrine (AUVI-Q) 0.3 mg/0.3 mL IJ SOAJ injection Use as directed for severe allergic reaction 2 Device 1  . Fluticasone Propionate (XHANCE) 93 MCG/ACT EXHU Place 2 sprays into the nose 2 (two) times daily. 32 mL 3  . levocetirizine (XYZAL) 5 MG tablet Take 5 mg by mouth every evening.    . montelukast (SINGULAIR) 10 MG tablet Take 1 tablet (10 mg total) by mouth at bedtime. 30 tablet 5  . PROAIR HFA 108 (90 Base) MCG/ACT inhaler INHALE 2 PUFFS INTO THE LUNGS EVERY 4 (FOUR) HOURS AS NEEDED FOR WHEEZING OR SHORTNESS OF BREATH. 8.5 Inhaler 1  . propranolol (INDERAL) 20 MG tablet TAKE 1 TABLET BY MOUTH TWICE A DAY 60 tablet 3  . acetaminophen (TYLENOL) 500 MG tablet Take 1,000 mg by mouth every 6 (six) hours as needed for mild pain or headache.    . cetirizine (ZYRTEC) 10 MG tablet Take 10 mg by mouth daily.    . naproxen sodium (ALEVE) 220 MG tablet Take 220 mg by mouth daily as needed (for  pain).     No current facility-administered medications on file prior to visit.     Allergies  Allergen Reactions  . Shellfish Allergy Anaphylaxis  . Hydrocodone Itching    Pt states he feels itchy after taking, but it doesn't preclude him taking hydrocodone if needed    Family History  Problem Relation Age of Onset  . Hypertension Mother   . Diabetes Father        type II  . Migraines Sister   . Diabetes Maternal Grandmother     Social History   Socioeconomic History  . Marital status: Single    Spouse name: Not on file  . Number of children: 0  . Years of education: College  . Highest education level: Not on file  Occupational History  . Occupation: [employer]  . Financial resource strain: Not on file  . Food insecurity:    Worry: Not on file    Inability: Not on file  . Transportation needs:    Medical: Not on file    Non-medical: Not on file  Tobacco Use  . Smoking status: Never Smoker  . Smokeless tobacco: Never Used  Substance and Sexual Activity  . Alcohol use: Yes    Frequency: Never    Comment: Seldom  . Drug use: Not Currently  Types: Marijuana    Comment: rare use  . Sexual activity: Yes    Partners: Female  Lifestyle  . Physical activity:    Days per week: Not on file    Minutes per session: Not on file  . Stress: Not on file  Relationships  . Social connections:    Talks on phone: Not on file    Gets together: Not on file    Attends religious service: Not on file    Active member of club or organization: Not on file    Attends meetings of clubs or organizations: Not on file    Relationship status: Not on file  Other Topics Concern  . Not on file  Social History Narrative   Lives alone   Caffeine use: 1 cup coffee per day   Right handed     Review of Systems - See HPI.  All other ROS are negative.  BP 116/76   Pulse 66   Temp 98.1 F (36.7 C) (Oral)   Resp 16   Ht 6' 5.25" (1.962 m)   Wt (!) 361 lb 9.6 oz  (164 kg)   SpO2 98%   BMI 42.60 kg/m   Physical Exam  Constitutional: He is oriented to person, place, and time. He appears well-developed and well-nourished.  HENT:  Head: Normocephalic and atraumatic.  Right Ear: External ear normal.  Left Ear: External ear normal.  Nose: Nose normal.  Mouth/Throat: Oropharynx is clear and moist.  Eyes: Conjunctivae are normal.  Neck: Neck supple. No thyromegaly present.  Cardiovascular: Normal rate, regular rhythm, normal heart sounds and intact distal pulses.  Pulmonary/Chest: Effort normal and breath sounds normal. No stridor. No respiratory distress. He has no wheezes. He has no rales. He exhibits no tenderness.  Abdominal: Soft. Bowel sounds are normal. He exhibits no distension and no mass. There is tenderness in the left upper quadrant. There is no rebound and no guarding. No hernia.  Lymphadenopathy:    He has no cervical adenopathy.  Neurological: He is alert and oriented to person, place, and time.  Psychiatric: He has a normal mood and affect.  Vitals reviewed.  Recent Results (from the past 2160 hour(s))  Urinalysis, Routine w reflex microscopic- may I&O cath if menses     Status: Abnormal   Collection Time: 02/12/18  9:53 PM  Result Value Ref Range   Color, Urine YELLOW YELLOW   APPearance CLEAR CLEAR   Specific Gravity, Urine 1.013 1.005 - 1.030   pH 6.0 5.0 - 8.0   Glucose, UA NEGATIVE NEGATIVE mg/dL   Hgb urine dipstick LARGE (A) NEGATIVE   Bilirubin Urine NEGATIVE NEGATIVE   Ketones, ur NEGATIVE NEGATIVE mg/dL   Protein, ur NEGATIVE NEGATIVE mg/dL   Nitrite NEGATIVE NEGATIVE   Leukocytes, UA NEGATIVE NEGATIVE   RBC / HPF 11-20 0 - 5 RBC/hpf   WBC, UA 0-5 0 - 5 WBC/hpf   Bacteria, UA NONE SEEN NONE SEEN   Mucus PRESENT     Comment: Performed at [facility], [address], [place], [place] [address]  Joaquim Lai analysis     Status: None   Collection Time: 02/14/18 11:21 AM  Result Value Ref Range   Nidus Not observed     Component 1 See Below     Comment: Calcium Oxalate Dihydrate ([place]) 30% Calcium Oxalate Monohydrate ([place]) 35% Carbonate Apatite ([place]) 35%    Stone Weight 0.0070 g    Comment:  This test was developed and its analytical  performance  characteristics have been determined by [facility]. It has not been cleared or approved by the [place] Food and Drug Administration. This assay has been validated  pursuant to the CLIA regulations and is used for clinical  purposes.   Clostridium difficile Toxin B, Qualitative, Real-Time PCR([facility])     Status: None   Collection Time: 02/19/18  1:41 PM  Result Value Ref Range   Toxigenic C. Difficile by PCR NOT DETECTED NOT DETECT    Comment: . This test is for use only with liquid or soft stools;  performance characteristics of other clinical specimen  types have not been established. . This assay was performed by Cepheid GeneXpert(R) PCR. The performance characteristics of this assay have been determined by [facility]. Performance characteristics refer to the analytical performance of the test. . For additional information, please refer to http://education.[facility].com/faq/FAQ136 (This link is being provided for informational/educational purposes only.)   Stool Culture     Status: None   Collection Time: 02/19/18  1:41 PM  Result Value Ref Range   MICRO NUMBER: [id number]    SPECIMEN QUALITY: ADEQUATE    SOURCE: STOOL    STATUS: FINAL    SHIGA RESULT: Not Detected    MICRO NUMBER: [id number]    SPECIMEN QUALITY: ADEQUATE    Source STOOL    STATUS: FINAL    CAM RESULT: No enteric Campylobacter isolated    MICRO NUMBER: [id number]    SPECIMEN QUALITY: ADEQUATE    SOURCE: STOOL    STATUS: FINAL    SS RESULT: No Salmonella or Shigella isolated   TIQ-NTM     Status: None   Collection Time: 02/19/18  1:41 PM  Result Value Ref Range   QUESTION/PROBLEM:      Comment: . No test(s)  are indicated on the requisition for the following specimen(s). .    SPECIMEN(S) RECEIVED: TF     Comment: REQUESTED INFORMATION _________________________________ . AUTHORIZED SIGNATURE __________________________________ . TO PREVENT FURTHER DELAYS IN TESTING, PLEASE COMPLETE INFORMATION ABOVE AND FAX TO [phone] TO RESOLVE  THIS ORDER.    Assessment/Plan: 1. Gastroesophageal reflux disease without esophagitis Resume GERD diet. Restart Prilosec taking daily. Zantac 150 mg BID for a few days. Follow-up if not quickly improving.    Leeanne Rio, PA-C

## 2018-05-09 ENCOUNTER — Ambulatory Visit (INDEPENDENT_AMBULATORY_CARE_PROVIDER_SITE_OTHER): Payer: 59 | Admitting: *Deleted

## 2018-05-09 DIAGNOSIS — J309 Allergic rhinitis, unspecified: Secondary | ICD-10-CM | POA: Diagnosis not present

## 2018-05-10 ENCOUNTER — Encounter: Payer: Self-pay | Admitting: Sports Medicine

## 2018-05-10 ENCOUNTER — Ambulatory Visit (INDEPENDENT_AMBULATORY_CARE_PROVIDER_SITE_OTHER): Payer: 59 | Admitting: Sports Medicine

## 2018-05-10 VITALS — BP 120/82 | HR 68 | Ht 77.25 in | Wt 362.2 lb

## 2018-05-10 DIAGNOSIS — M25561 Pain in right knee: Secondary | ICD-10-CM | POA: Diagnosis not present

## 2018-05-10 DIAGNOSIS — M25571 Pain in right ankle and joints of right foot: Secondary | ICD-10-CM | POA: Diagnosis not present

## 2018-05-10 DIAGNOSIS — K219 Gastro-esophageal reflux disease without esophagitis: Secondary | ICD-10-CM

## 2018-05-10 MED ORDER — IBUPROFEN-FAMOTIDINE 800-26.6 MG PO TABS: 1.0000 | ORAL_TABLET | Freq: Three times a day (TID) | ORAL | 0 refills | Status: AC | PRN

## 2018-05-10 MED ORDER — IBUPROFEN-FAMOTIDINE 800-26.6 MG PO TABS: 1.0000 | ORAL_TABLET | Freq: Three times a day (TID) | ORAL | 2 refills | Status: DC | PRN

## 2018-05-10 NOTE — Patient Instructions (Addendum)
Please perform the exercise program that we have prepared for you and gone over in detail on a daily basis.  In addition to the handout you were provided you can access your program through: [email or web address]   Your unique program code is: [place] instructions for Duexis, [place] and [place]:  Your prescription will be filled through a mail order pharmacy.  It is typically [place] but may vary depending on where you live.  You will receive a phone call from them which will typically come from a [phone] phone number.  You must speak directly to them to have this medication filled.  When the pharmacy calls, they will need your mailing address (for overnight shipment of the medication) andy they will need payment information if you have a copay (typically no more than $10). If you have not heard from them 2-3 days after your appointment with Dr. Paulla Fore, contact us at the office [phone]) or through [facility] so we can reach back out to the pharmacy.

## 2018-05-10 NOTE — Progress Notes (Signed)
Bradley [name]. Bradley [name], [facility] - 22 y.o. male MRN [id number]  Date of birth: 1996-09-05  Visit Date: 05/10/2018  PCP: Brunetta Jeans, PA-C   Referred by: Brunetta Jeans, PA-C  Scribe(s) for today's visit: Wendy Poet, LAT, ATC  SUBJECTIVE:  Bradley [name] is here for No chief complaint on file. Marland Kitchen    His R knee and R lateral ankle  pain symptoms INITIALLY: Began on Monday when he planted and twisted on his R leg while playing basketball, feeling pain in both his R knee and ankle.  He states that he heard a "pop" at the time of the injury but isn't sure if it came from his R ankle or his R knee.  States that he is currently experiencing c/o instability in his R knee. Described as moderate dull, pulsating pain, nonradiating Worsened with transitioning from sit-to-stand Improved with rest, ice, compression Additional associated symptoms include: some mechanical symptoms noted in the R knee; no swelling noted; no N/T into R LE    At this time symptoms are improving compared to onset  He has been wearing a Body Helix knee sleeve and icing his knee.   REVIEW OF SYSTEMS: Denies night time disturbances. Denies fevers, chills, or night sweats. Denies unexplained weight loss. Denies personal history of cancer. Denies changes in bowel or bladder habits. Reports recent unreported falls.  While playing basketball when he injured his R knee and ankle. Denies new or worsening dyspnea or wheezing. Denies headaches or dizziness.  Denies numbness, tingling or weakness  In the extremities.  Denies dizziness or presyncopal episodes Denies lower extremity edema    HISTORY & PERTINENT PRIOR DATA:  Prior History reviewed and updated per electronic medical record.  Significant/pertinent history, findings, studies include:  reports that he has never smoked. He has never used smokeless tobacco. No results for  input(s): HGBA1C, LABURIC, CREATINE in the last 8760 hours. No specialty comments available. No problems updated.  OBJECTIVE:  VS:  HT:6' 5.25" (196.2 cm)   WT:(Abnormal) 362 lb 3.2 oz (164.3 kg)  BMI:42.68    BP:120/82  HR:68bpm  TEMP: ( )  RESP:98 %   PHYSICAL EXAM: CONSTITUTIONAL: Well-developed, Well-nourished and In no acute distress Alert & appropriately interactive. and Not depressed or anxious appearing. Respiratory: No increased work of breathing.  Trachea Midline EYES: Pupils are equal., EOM intact without nystagmus. and No scleral icterus.  Lower EXTREMITY EXAM: Warm and well perfused Edema: Joint associated swelling as per Orthopedic exam Calf supple with no pain with squeeze (Negative Homans) NEURO: unremarkable  MSK Exam: Right knee is overall well aligned without significant deformity or effusion.  He is ligamentously stable.  No significant pain with meniscal testing.  He has a small amount pain with palpation of the patellar tendon but this is minimal.  Extension is intact.  Right ankle well aligned without significant deformity.  Pain over the ligament as well as the peroneal tendons.  Four-way ankle strength is 5 out of 5 with limited dorsiflexion and plantarflexion range of motion.  Ankle drawer testing and talar tilt testing is normal. Patient over the medial or lateral malleolus.  No pain along the base of fifth metatarsal or navicular.   PROCEDURES & DATA REVIEWED:  PROCEDURE NOTE: THERAPEUTIC EXERCISES (97110) 15 minutes spent for Therapeutic exercises as below and as referenced in the AVS.  This included exercises focusing on stretching, strengthening, with significant  focus on eccentric aspects.   Proper technique shown and discussed handout in great detail with ATC.  All questions were discussed and answered.   Long term goals include an improvement in range of motion, strength, endurance as well as avoiding reinjury. Frequency of visits is one time as  determined during today's  office visit. Frequency of exercises to be performed is as per handout.  EXERCISES REVIEWED:  4 Way Ankle exercises  ASSESSMENT  No diagnosis found.  PLAN:  Simple ankle sprain and knee contusion.  Discussed the foundation of treatment for this condition is physical therapy and/or daily (5-6 days/week) therapeutic exercises, focusing on core strengthening, coordination, neuromuscular control/reeducation.  Therapeutic exercises prescribed per procedure note.  Continue with bracing including ankle bracing and Body Helix Compression Sleeve compression as needed.  NSAIDs x1 week scheduled and as needed.  No problem-specific Assessment & Plan notes found for this encounter. No orders of the defined types were placed in this encounter. No orders of the defined types were placed in this encounter.    Follow-up: No follow-ups on file.      Please see additional documentation for Objective, Assessment and Plan sections. Pertinent additional documentation may be included in corresponding procedure notes, imaging studies, problem based documentation and patient instructions. Please see these sections of the encounter for additional information regarding this visit.  CMA/ATC served as Education administrator during this visit. History, Physical, and Plan performed by medical provider. Documentation and orders reviewed and attested to.      Gerda Diss, [facility] Physician

## 2018-05-10 NOTE — Addendum Note (Signed)
Addended by: Wendy Poet on: 05/10/2018 03:43 PM   Modules accepted: Orders

## 2018-05-14 ENCOUNTER — Ambulatory Visit (INDEPENDENT_AMBULATORY_CARE_PROVIDER_SITE_OTHER): Payer: 59 | Admitting: *Deleted

## 2018-05-14 DIAGNOSIS — J309 Allergic rhinitis, unspecified: Secondary | ICD-10-CM | POA: Diagnosis not present

## 2018-05-16 ENCOUNTER — Encounter: Payer: Self-pay | Admitting: Physician Assistant

## 2018-05-17 ENCOUNTER — Ambulatory Visit (INDEPENDENT_AMBULATORY_CARE_PROVIDER_SITE_OTHER): Payer: 59

## 2018-05-17 DIAGNOSIS — J309 Allergic rhinitis, unspecified: Secondary | ICD-10-CM | POA: Diagnosis not present

## 2018-05-22 ENCOUNTER — Ambulatory Visit (INDEPENDENT_AMBULATORY_CARE_PROVIDER_SITE_OTHER): Payer: 59

## 2018-05-22 DIAGNOSIS — J309 Allergic rhinitis, unspecified: Secondary | ICD-10-CM | POA: Diagnosis not present

## 2018-05-28 ENCOUNTER — Ambulatory Visit (INDEPENDENT_AMBULATORY_CARE_PROVIDER_SITE_OTHER): Payer: 59 | Admitting: Physician Assistant

## 2018-05-28 ENCOUNTER — Other Ambulatory Visit: Payer: Self-pay

## 2018-05-28 ENCOUNTER — Encounter: Payer: Self-pay | Admitting: Physician Assistant

## 2018-05-28 VITALS — BP 110/82 | HR 63 | Temp 98.3°F | Resp 16 | Ht 77.0 in | Wt 358.0 lb

## 2018-05-28 DIAGNOSIS — F419 Anxiety disorder, unspecified: Secondary | ICD-10-CM

## 2018-05-28 DIAGNOSIS — M940 Chondrocostal junction syndrome [Tietze]: Secondary | ICD-10-CM

## 2018-05-28 MED ORDER — CYCLOBENZAPRINE HCL 10 MG PO TABS: 10.0000 mg | ORAL_TABLET | Freq: Every day | ORAL | 0 refills | Status: DC

## 2018-05-28 MED ORDER — CLONAZEPAM 0.5 MG PO TBDP
0.5000 mg | ORAL_TABLET | Freq: Two times a day (BID) | ORAL | 0 refills | Status: DC | PRN
Start: 2018-05-28 — End: 2018-12-09

## 2018-05-28 NOTE — Patient Instructions (Signed)
Please stay well-hydrated and get plenty of rest. Avoid heavy lifting -- especially bench pressing. Take Aleve once daily in the morning with food. Continue the Prilosec daily. If you note any recurrence of reflux, please stop the Aleve and let me know.  Use the muscle relaxant in the evening over the next few evenings to see if this helps.  If not improving, we will set you up with Sports medicine.

## 2018-05-28 NOTE — Progress Notes (Signed)
Patient presents to clinic today to discuss ongoing, intermittent chest discomfort. Patient has had varying symptoms, including reflux-related symptoms alleviated with PPI and dietary changes. Notes still having an aching pain in mid chest, lasting a few minutes to all day. Denies any pleuritic pains, SOB, LH or dizziness. Has had a completely negative Cardiac workup. Denies chest tightness or wheezing. Denies known trauma or injury. Has been lifting weights on occasion but has not in the past 1-2 weeks.  Has not taken anything OTC for symptoms.  Past Medical History:  Diagnosis Date  . Anxiety   . Chronic headaches   . Clavicular fracture   . History of chickenpox   . Shingles    22 years old.    Current Outpatient Medications on File Prior to Visit  Medication Sig Dispense Refill  . albuterol (PROVENTIL) (2.5 MG/3ML) 0.083% nebulizer solution Take 3 mLs (2.5 mg total) by nebulization every 6 (six) hours as needed for wheezing or shortness of breath. 75 mL 1  . clindamycin (CLEOCIN T) 1 % lotion APPLY TO AFFECTED AREA UP TO TWICE DAILY AS NEEDED    . clonazePAM (KLONOPIN) 0.25 MG disintegrating tablet Take 1 tablet (0.25 mg total) by mouth 2 (two) times daily as needed. 10 tablet 0  . EPINEPHrine (AUVI-Q) 0.3 mg/0.3 mL IJ SOAJ injection Use as directed for severe allergic reaction 2 Device 1  . levocetirizine (XYZAL) 5 MG tablet Take 5 mg by mouth every evening.    . montelukast (SINGULAIR) 10 MG tablet Take 1 tablet (10 mg total) by mouth at bedtime. 30 tablet 5  . PROAIR HFA 108 (90 Base) MCG/ACT inhaler INHALE 2 PUFFS INTO THE LUNGS EVERY 4 (FOUR) HOURS AS NEEDED FOR WHEEZING OR SHORTNESS OF BREATH. 8.5 Inhaler 1  . propranolol (INDERAL) 20 MG tablet TAKE 1 TABLET BY MOUTH TWICE A DAY 60 tablet 3   No current facility-administered medications on file prior to visit.     Allergies  Allergen Reactions  . Shellfish Allergy Anaphylaxis  . Hydrocodone Itching    Pt states he feels  itchy after taking, but it doesn't preclude him taking hydrocodone if needed    Family History  Problem Relation Age of Onset  . Hypertension Mother   . Diabetes Father        type II  . Migraines Sister   . Diabetes Maternal Grandmother     Social History   Socioeconomic History  . Marital status: Single    Spouse name: Not on file  . Number of children: 0  . Years of education: College  . Highest education level: Not on file  Occupational History  . Occupation: [employer]  . Financial resource strain: Not on file  . Food insecurity:    Worry: Not on file    Inability: Not on file  . Transportation needs:    Medical: Not on file    Non-medical: Not on file  Tobacco Use  . Smoking status: Never Smoker  . Smokeless tobacco: Never Used  Substance and Sexual Activity  . Alcohol use: Yes    Frequency: Never    Comment: Seldom  . Drug use: Not Currently    Types: Marijuana    Comment: rare use  . Sexual activity: Yes    Partners: Female  Lifestyle  . Physical activity:    Days per week: Not on file    Minutes per session: Not on file  . Stress: Not on file  Relationships  . Social connections:    Talks on phone: Not on file    Gets together: Not on file    Attends religious service: Not on file    Active member of club or organization: Not on file    Attends meetings of clubs or organizations: Not on file    Relationship status: Not on file  Other Topics Concern  . Not on file  Social History Narrative   Lives alone   Caffeine use: 1 cup coffee per day   Right handed    Review of Systems - See HPI.  All other ROS are negative.  BP 110/82   Pulse 63   Temp 98.3 F (36.8 C) (Oral)   Resp 16   Ht 6\' 5"  (1.956 m)   Wt (!) 358 lb (162.4 kg)   SpO2 98%   BMI 42.45 kg/m   Physical Exam  Constitutional: He appears well-developed and well-nourished.  HENT:  Head: Normocephalic and atraumatic.  Eyes: Conjunctivae are normal.  Neck: Neck  supple.  Cardiovascular: Normal rate, regular rhythm, normal heart sounds and intact distal pulses.  Pulmonary/Chest: Effort normal and breath sounds normal. No stridor. No respiratory distress. He has no wheezes. He has no rales. He exhibits tenderness (+ sternal).  Psychiatric: He has a normal mood and affect.  Vitals reviewed.  Assessment/Plan: 1. Costochondritis Start Aleve. No resistance training over the next 2 weeks. Ok to continue cardio. Supportive measures reviewed. Will have him see his Sports Med provider if not improving.  2. Anxiety Medication refilled to use PRN - clonazePAM (KLONOPIN) 0.5 MG disintegrating tablet; Take 1 tablet (0.5 mg total) by mouth 2 (two) times daily as needed.  Dispense: 30 tablet; Refill: 0   Leeanne Rio, PA-C

## 2018-05-30 ENCOUNTER — Ambulatory Visit (INDEPENDENT_AMBULATORY_CARE_PROVIDER_SITE_OTHER): Payer: 59 | Admitting: *Deleted

## 2018-05-30 DIAGNOSIS — J309 Allergic rhinitis, unspecified: Secondary | ICD-10-CM | POA: Diagnosis not present

## 2018-06-03 ENCOUNTER — Encounter: Payer: Self-pay | Admitting: Physician Assistant

## 2018-06-03 ENCOUNTER — Telehealth: Payer: Self-pay

## 2018-06-03 NOTE — Telephone Encounter (Signed)
Yes have him do that. Thank you.

## 2018-06-03 NOTE — Telephone Encounter (Signed)
Can we check patient's ped immunizations on [facility]? Thank you.

## 2018-06-03 NOTE — Telephone Encounter (Signed)
Called patient to let him know that I did not see his records come up on the [facility] for any of his immunizations and asked if he could send [place] his immunization records so that we could update his chart. Patient verbalized an understanding and stated that he would get those to [place].

## 2018-06-04 ENCOUNTER — Ambulatory Visit (INDEPENDENT_AMBULATORY_CARE_PROVIDER_SITE_OTHER): Payer: 59 | Admitting: *Deleted

## 2018-06-04 DIAGNOSIS — J309 Allergic rhinitis, unspecified: Secondary | ICD-10-CM | POA: Diagnosis not present

## 2018-06-05 ENCOUNTER — Encounter: Payer: Self-pay | Admitting: Physician Assistant

## 2018-06-06 ENCOUNTER — Other Ambulatory Visit: Payer: Self-pay | Admitting: *Deleted

## 2018-06-06 MED ORDER — BECLOMETHASONE DIPROP HFA 80 MCG/ACT IN AERB: 2.0000 | INHALATION_SPRAY | Freq: Two times a day (BID) | RESPIRATORY_TRACT | 5 refills | Status: DC

## 2018-06-06 NOTE — Telephone Encounter (Signed)
I called and spoke with Bradley [name]. An appointment has been scheduled for him to come in and be seen. A prescription has been sent in for QVAR. Patient is aware.

## 2018-06-07 ENCOUNTER — Encounter: Payer: Self-pay | Admitting: Emergency Medicine

## 2018-06-07 ENCOUNTER — Ambulatory Visit (INDEPENDENT_AMBULATORY_CARE_PROVIDER_SITE_OTHER): Payer: 59 | Admitting: Emergency Medicine

## 2018-06-07 ENCOUNTER — Encounter: Payer: Self-pay | Admitting: Allergy

## 2018-06-07 ENCOUNTER — Ambulatory Visit (INDEPENDENT_AMBULATORY_CARE_PROVIDER_SITE_OTHER): Payer: 59 | Admitting: Allergy

## 2018-06-07 VITALS — BP 122/72 | HR 69 | Resp 18

## 2018-06-07 VITALS — BP 122/68 | HR 82 | Ht 78.0 in | Wt 364.0 lb

## 2018-06-07 DIAGNOSIS — J3089 Other allergic rhinitis: Secondary | ICD-10-CM

## 2018-06-07 DIAGNOSIS — J4530 Mild persistent asthma, uncomplicated: Secondary | ICD-10-CM

## 2018-06-07 DIAGNOSIS — J45909 Unspecified asthma, uncomplicated: Secondary | ICD-10-CM

## 2018-06-07 DIAGNOSIS — J4520 Mild intermittent asthma, uncomplicated: Secondary | ICD-10-CM | POA: Insufficient documentation

## 2018-06-07 DIAGNOSIS — J301 Allergic rhinitis due to pollen: Secondary | ICD-10-CM | POA: Diagnosis not present

## 2018-06-07 DIAGNOSIS — T781XXD Other adverse food reactions, not elsewhere classified, subsequent encounter: Secondary | ICD-10-CM

## 2018-06-07 DIAGNOSIS — J309 Allergic rhinitis, unspecified: Secondary | ICD-10-CM | POA: Insufficient documentation

## 2018-06-07 DIAGNOSIS — T7819XD Other adverse food reactions, not elsewhere classified, subsequent encounter: Secondary | ICD-10-CM

## 2018-06-07 HISTORY — DX: Mild intermittent asthma, uncomplicated: J45.20

## 2018-06-07 NOTE — Progress Notes (Signed)
Follow-up Note  RE: Bradley [name] MRN: [id number] DOB: December 22, 1995 Date of Office Visit: 06/07/2018   History of present illness: Bradley [name] is a 22 y.o. male presenting today for follow-up of asthma, allergic rhinitis and adverse reaction.  He was last seen in the office on Jan 31, 2018 by myself.  He denies any major health changes, surgeries or hospitalizations since his visit.  He has started on allergen immunotherapy and has been able to come to twice a week and is at the red vial.  He states he is noticed that he has had decrease in his allergy symptoms since being on immunotherapy.  He still continues to use Xyzal, X hands and Singulair. He is most concerned with his breathing.  He states for the past week he has had cough and felt winded and chest tightness especially with activities like climbing up flights of stairs.  He has required use of the albuterol.  I did prescribe him Qvar to start as a maintenance medication at this time which she did pick up yesterday and has done a dose.  He states he does feel that he gets better improvement in his symptoms when he does use his nebulizer versus when he use the albuterol inhaler. He has been avoiding shellfish and has access to an epinephrine device which he has not needed to use.  Review of systems: Review of Systems  Constitutional: Negative for chills, fever and malaise/fatigue.  HENT: Negative for congestion, ear discharge, ear pain, nosebleeds, sinus pain and sore throat.   Eyes: Negative for pain, discharge and redness.  Respiratory: Positive for cough and shortness of breath. Negative for hemoptysis, sputum production and wheezing.   Cardiovascular: Negative for chest pain.  Gastrointestinal: Negative for abdominal pain, constipation, diarrhea, heartburn, nausea and vomiting.  Musculoskeletal: Negative for joint pain.  Skin: Negative for itching and rash.  Neurological: Negative for headaches.    All other systems negative  unless noted above in HPI  Past medical/social/surgical/family history have been reviewed and are unchanged unless specifically indicated below.  No changes  Medication List: Allergies as of 06/07/2018      Reactions   Shellfish Allergy Anaphylaxis   Hydrocodone Itching   Pt states he feels itchy after taking, but it doesn't preclude him taking hydrocodone if needed      Medication List        Accurate as of 06/07/18 12:24 PM. Always use your most recent med list.          beclomethasone 80 MCG/ACT inhaler Commonly known as:  QVAR Inhale 2 puffs into the lungs 2 (two) times daily.   clindamycin 1 % lotion Commonly known as:  CLEOCIN T APPLY TO AFFECTED AREA UP TO TWICE DAILY AS NEEDED   clonazePAM 0.5 MG disintegrating tablet Commonly known as:  KLONOPIN Take 1 tablet (0.5 mg total) by mouth 2 (two) times daily as needed.   cyclobenzaprine 10 MG tablet Commonly known as:  FLEXERIL Take 1 tablet (10 mg total) by mouth at bedtime.   EPINEPHrine 0.3 mg/0.3 mL Soaj injection Commonly known as:  EPI-PEN Use as directed for severe allergic reaction   levocetirizine 5 MG tablet Commonly known as:  XYZAL Take 5 mg by mouth every evening.   montelukast 10 MG tablet Commonly known as:  SINGULAIR Take 1 tablet (10 mg total) by mouth at bedtime.   PROAIR HFA 108 (90 Base) MCG/ACT inhaler Generic drug:  albuterol INHALE 2 PUFFS INTO THE LUNGS  EVERY 4 (FOUR) HOURS AS NEEDED FOR WHEEZING OR SHORTNESS OF BREATH.   albuterol (2.5 MG/3ML) 0.083% nebulizer solution Commonly known as:  PROVENTIL Take 3 mLs (2.5 mg total) by nebulization every 6 (six) hours as needed for wheezing or shortness of breath.   propranolol 20 MG tablet Commonly known as:  INDERAL TAKE 1 TABLET BY MOUTH TWICE A DAY       Known medication allergies: Allergies  Allergen Reactions  . Shellfish Allergy Anaphylaxis  . Hydrocodone Itching    Pt states he feels itchy after taking, but it doesn't  preclude him taking hydrocodone if needed     Physical examination: Blood pressure 122/72, pulse 69, resp. rate 18, SpO2 99 %.  General: Alert, interactive, in no acute distress. HEENT: PERRLA, TMs pearly gray, turbinates mildly edematous without discharge, post-pharynx non erythematous. Neck: Supple without lymphadenopathy. Lungs: Clear to auscultation without wheezing, rhonchi or rales. {no increased work of breathing. CV: Normal S1, S2 without murmurs. Abdomen: Nondistended, nontender. Skin: Warm and dry, without lesions or rashes. Extremities:  No clubbing, cyanosis or edema. Neuro:   Grossly intact.  Diagnositics/Labs: Labs:  Component     Latest Ref Rng & Units 01/18/2018 01/18/2018         2:34 PM  2:34 PM  IgE (Immunoglobulin E), Serum     6 - 495 IU/mL 226   D Pteronyssinus IgE     Class 0/I kU/L 0.21 (A)   D Farinae IgE     Class 0/I kU/L 0.20 (A)   Cat Dander IgE     Class I kU/L 0.41 (A)   Dog Dander IgE     Class I kU/L 0.35 (A)   [place] Grass IgE     Class 0/I kU/L 0.12 (A)   Timothy Grass IgE     Class 0 kU/L <0.10   Johnson Grass IgE     Class 0/I kU/L 0.12 (A)   Cockroach, German IgE     Class II kU/L 0.94 (A)   Penicillium Chrysogen IgE     Class 0 kU/L <0.10   Cladosporium Herbarum IgE     Class 0 kU/L <0.10   Aspergillus Fumigatus IgE     Class 0 kU/L <0.10   Alternaria Alternata IgE     Class 0 kU/L <0.10   Maple/Box Elder IgE     Class 0/I kU/L 0.10 (A)   Common Silver Wendee Copp IgE     Class 0/I kU/L 0.18 (A)   Cedar, Mountain IgE     Class 0 kU/L <0.10   Oak, White IgE     Class 0/I kU/L 0.12 (A)   Elm, American IgE     Class 0/I kU/L 0.17 (A)   Cottonwood IgE     Class 0/I kU/L 0.13 (A)   Pecan, Hickory IgE     Class 0/I kU/L 0.10 (A)   White Mulberry IgE     Class 0 kU/L <0.10   Ragweed, Short IgE     Class 0/I kU/L 0.11 (A)   Pigweed, Rough IgE     Class 0 kU/L <0.10   Sheep Sorrel IgE Qn     Class 0/I kU/L 0.12 (A)   Mouse  Urine IgE     Class 0 kU/L <0.10   Clam IgE     Class I kU/L  0.35 (A)  F023-IgE Crab     Class 0 kU/L  <0.10  Shrimp IgE     Class II kU/L  1.26 (A)  Scallop IgE     Class II kU/L  0.73 (A)  F290-IgE Oyster     Class 0 kU/L  <0.10  F080-IgE Lobster     Class 0 kU/L  <0.10  Jalapeno Pepper     <0.35 kU/L <0.35   Class Interpretation      0   specimen status report      Comment     Spirometry: FEV1: 5.37L 129%, FVC: 6.4L 129%, ratio consistent with nonobstructive pattern  Assessment and plan:   Rhinitis, allergic   - continue allergen avoidance measures for dust mites, cat, dog, grass, trees, weeds, cockroach.    - may use Allegra-D during times of severe congestion/sinusitis.  Use for brief periods at a time and stop once symptoms have improved and resume plain Allegra    - Xyzal 5mg  daily for allergy symptoms relief    - Use of XHance nasal device (the medication base is Flonase).  This device allows for deeper deposition of the nasal spray into your sinuses to provide better efficacy.   Use 2 sprays each nostril twice a day.     - continue nasal saline rinse 1-2 times a day.  Use prior to using your nasal spray.      - continue Singulair 10mg  daily at bedtime    - continue allergen immunotherapy (allergy shots) as scheduled and have access to epinephrine device  Adverse food reaction   - jalapeno IgE is negative.  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     - continue avoidance of shellfish.  Will plan to repeat serum IgE levels next spring to follow trends   - have access to self-injectable epinephrine (Epipen or AuviQ) 0.3mg  at all times   - follow emergency action plan in case of allergic reaction previously provided   Asthma, mild persistent    - continue singulair as above.     - have access to albuterol inhaler 2 puffs every 4-6 hours as needed for cough/wheeze/shortness of breath/chest tightness.  May use 15-20 minutes prior to activity.    Monitor frequency of use.      - start Qvar 38mcg 2 puffs twice a day   - lung function testing is normal   Follow-up 6 month sooner if needed  I appreciate the opportunity to take part in Saivion's care. Please do not hesitate to contact me with questions.  Sincerely,   Prudy Feeler, MD Allergy/Immunology [facility]

## 2018-06-07 NOTE — Assessment & Plan Note (Signed)
Clinical syndrome does sound like asthma.  He responds to albuterol.  He is flaring more recently either due to increased allergic exposure or possibly to some breakthrough GERD.  He just started Prilosec again.  He was prescribed Qvar but has not had a chance to start it yet.  I reviewed his pulmonary function testing and these are somewhat difficult to interpret due to the short expiratory time of about 2-1/2 seconds.  I think they need to be repeated.  Keep albuterol available to use 2 puffs up to every 4 hours if needed for shortness of breath, chest tightness, wheezing. Agree with starting Qvar 80 mcg, 2 puffs twice a day.  Remember to rinse and gargle after using this medication. Agree with restarting Prilosec 20 mg once a day.  Take this medication 1 hour before eating. We will perform full pulmonary function testing at your next visit. Follow with Dr Lamonte Sakai next available with full PFT

## 2018-06-07 NOTE — Progress Notes (Signed)
Subjective:    Patient ID: Bradley [name], male    DOB: May 15, 1996, 22 y.o.   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  He has been followed for mild intermittent to mild persistent asthma, previously managed on Singulair and albuterol as needed.  Recently added Qvar 80 mcg 2 puffs twice a day yesterday.  He reports that he was well until this year, began to have a lot of nasal congestion, drainage, then developed some exertional SOB, minimal wheeze. A lot of sore throat, intermittent coughing. Has been more bothersome last several days with more dyspnea. Albuterol use went up to once a day. He does respond to it - feels less SOB. Some increase in cough and sore throat. He has occasionally had some reflux sx, formerly on rx, just empirically restarted.    Review of Systems  Constitutional: Negative for fever and unexpected weight change.  HENT: Positive for sore throat. Negative for congestion, dental problem, ear pain, nosebleeds, postnasal drip, rhinorrhea, sinus pressure, sneezing and trouble swallowing.   Eyes: Negative for redness and itching.  Respiratory: Positive for cough, shortness of breath and wheezing. Negative for chest tightness.   Cardiovascular: Negative for palpitations and leg swelling.  Gastrointestinal: Negative for nausea and vomiting.  Genitourinary: Negative for dysuria.  Musculoskeletal: Negative for joint swelling.  Skin: Negative for rash.  Neurological: Negative for headaches.  Hematological: Does not bruise/bleed easily.  Psychiatric/Behavioral: Negative for dysphoric mood. The patient is not nervous/anxious.     Past Medical History:  Diagnosis Date  . Anxiety   . Chronic headaches   . Clavicular fracture   . History of chickenpox   . Mild intermittent asthma 06/07/2018  . Shingles    22  years old.     Family History  Problem Relation Age of Onset  . Hypertension Mother   . Diabetes Father        type II  . Migraines Sister   . Diabetes Maternal Grandmother      Social History   Socioeconomic History  . Marital status: Single    Spouse name: Not on file  . Number of children: 0  . Years of education: College  . Highest education level: Not on file  Occupational History  . Occupation: [employer]  . Financial resource strain: Not on file  . Food insecurity:    Worry: Not on file    Inability: Not on file  . Transportation needs:    Medical: Not on file    Non-medical: Not on file  Tobacco Use  . Smoking status: Never Smoker  . Smokeless tobacco: Never Used  Substance and Sexual Activity  . Alcohol use: Yes    Frequency: Never    Comment: Seldom  . Drug use: Not Currently    Types: Marijuana    Comment: rare use  . Sexual activity: Yes    Partners: Female  Lifestyle  . Physical activity:    Days per week: Not on file    Minutes per session: Not on file  . Stress: Not on file  Relationships  . Social connections:    Talks on phone: Not on file    Gets together: Not on file    Attends religious service: Not on file    Active member of club or organization: Not on file  Attends meetings of clubs or organizations: Not on file    Relationship status: Not on file  . Intimate partner violence:    Fear of current or ex partner: Not on file    Emotionally abused: Not on file    Physically abused: Not on file    Forced sexual activity: Not on file  Other Topics Concern  . 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  Former [place] use.   Allergies  Allergen Reactions  . Shellfish Allergy Anaphylaxis  . Hydrocodone Itching    Pt states he feels itchy after taking, but it doesn't preclude  him taking hydrocodone if needed     Outpatient Medications Prior to Visit  Medication Sig Dispense Refill  . albuterol (PROVENTIL) (2.5 MG/3ML) 0.083% nebulizer solution Take 3 mLs (2.5 mg total) by nebulization every 6 (six) hours as needed for wheezing or shortness of breath. 75 mL 1  . beclomethasone (QVAR REDIHALER) 80 MCG/ACT inhaler Inhale 2 puffs into the lungs 2 (two) times daily. 1 Inhaler 5  . clindamycin (CLEOCIN T) 1 % lotion APPLY TO AFFECTED AREA UP TO TWICE DAILY AS NEEDED    . clonazePAM (KLONOPIN) 0.5 MG disintegrating tablet Take 1 tablet (0.5 mg total) by mouth 2 (two) times daily as needed. 30 tablet 0  . cyclobenzaprine (FLEXERIL) 10 MG tablet Take 1 tablet (10 mg total) by mouth at bedtime. 15 tablet 0  . EPINEPHrine (AUVI-Q) 0.3 mg/0.3 mL IJ SOAJ injection Use as directed for severe allergic reaction 2 Device 1  . levocetirizine (XYZAL) 5 MG tablet Take 5 mg by mouth every evening.    . montelukast (SINGULAIR) 10 MG tablet Take 1 tablet (10 mg total) by mouth at bedtime. 30 tablet 5  . PROAIR HFA 108 (90 Base) MCG/ACT inhaler INHALE 2 PUFFS INTO THE LUNGS EVERY 4 (FOUR) HOURS AS NEEDED FOR WHEEZING OR SHORTNESS OF BREATH. 8.5 Inhaler 1  . propranolol (INDERAL) 20 MG tablet TAKE 1 TABLET BY MOUTH TWICE A DAY 60 tablet 3   No facility-administered medications prior to visit.         Objective:   Physical Exam Vitals:   06/07/18 1501  BP: 122/68  Pulse: 82  SpO2: 98%  Weight: (!) 364 lb (165.1 kg)  Height: 6\' 6"  (1.981 m)   Gen: Pleasant, well-nourished, in no distress,  normal affect  ENT: No lesions,  mouth clear,  oropharynx clear, no postnasal drip  Neck: No JVD, no stridor  Lungs: No use of accessory muscles, no wheeze, no crackles.   Cardiovascular: RRR, heart sounds normal, no murmur or gallops, no peripheral edema  Musculoskeletal: No deformities, no cyanosis or clubbing  Neuro: alert, non focal  Skin: Warm, no lesions or rash       Assessment & Plan:  Allergic rhinitis With documented sensitivities and now on immunotherapy.  He initially benefited is having some additional flaring now.  Please continue your immunotherapy, Singulair, Xyzal as you have been taking them.  Mild intermittent asthma Clinical syndrome does sound like asthma.  He responds to albuterol.  He is flaring more recently either due to increased allergic exposure or possibly to some breakthrough GERD.  He just started Prilosec again.  He was prescribed Qvar but has not had a chance to start it  yet.  I reviewed his pulmonary function testing and these are somewhat difficult to interpret due to the short expiratory time of about 2-1/2 seconds.  I think they need to be repeated.  Keep albuterol available to use 2 puffs up to every 4 hours if needed for shortness of breath, chest tightness, wheezing. Agree with starting Qvar 80 mcg, 2 puffs twice a day.  Remember to rinse and gargle after using this medication. Agree with restarting Prilosec 20 mg once a day.  Take this medication 1 hour before eating. We will perform full pulmonary function testing at your next visit. Follow with Dr Lamonte Sakai next available with full PFT  Baltazar Apo, MD, PhD 06/07/2018, 3:36 PM [facility] [phone] or if no answer [phone]

## 2018-06-07 NOTE — Patient Instructions (Addendum)
Rhinitis, allergic   - continue allergen avoidance measures for dust mites, cat, dog, grass, trees, weeds, cockroach.    - may use Allegra-D during times of severe congestion/sinusitis.  Use for brief periods at a time and stop once symptoms have improved and resume plain Allegra    - Xyzal 5mg  daily for allergy symptoms relief    - Use of XHance nasal device (the medication base is Flonase).  This device allows for deeper deposition of the nasal spray into your sinuses to provide better efficacy.   Use 2 sprays each nostril twice a day.     - continue nasal saline rinse 1-2 times a day.  Use prior to using your nasal spray.      - continue Singulair 10mg  daily at bedtime    - continue allergen immunotherapy (allergy shots) as scheduled and have access to epinephrine device  Adverse food reaction   - jalapeno IgE is negative.  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     - continue avoidance of shellfish.  Will plan to repeat serum IgE levels next spring to follow trends   - have access to self-injectable epinephrine (Epipen or AuviQ) 0.3mg  at all times   - follow emergency action plan in case of allergic reaction previously provided   Asthma    - continue singulair as above.     - have access to albuterol inhaler 2 puffs every 4-6 hours as needed for cough/wheeze/shortness of breath/chest tightness.  May use 15-20 minutes prior to activity.   Monitor frequency of use.      - start Qvar 53mcg 2 puffs twice a day   - lung function testing is normal    Follow-up 6 month sooner if needed

## 2018-06-07 NOTE — Assessment & Plan Note (Signed)
With documented sensitivities and now on immunotherapy.  He initially benefited is having some additional flaring now.  Please continue your immunotherapy, Singulair, Xyzal as you have been taking them.

## 2018-06-07 NOTE — Patient Instructions (Addendum)
Please continue your immunotherapy, Singulair, Xyzal as you have been taking them. Keep albuterol available to use 2 puffs up to every 4 hours if needed for shortness of breath, chest tightness, wheezing. Agree with starting Qvar 80 mcg, 2 puffs twice a day.  Remember to rinse and gargle after using this medication. Agree with restarting Prilosec 20 mg once a day.  Take this medication 1 hour before eating. We will perform full pulmonary function testing at your next visit. Follow with Dr Lamonte Sakai next available with full PFT

## 2018-06-11 ENCOUNTER — Encounter: Payer: Self-pay | Admitting: Physician Assistant

## 2018-06-12 ENCOUNTER — Telehealth: Payer: Self-pay | Admitting: *Deleted

## 2018-06-12 ENCOUNTER — Institutional Professional Consult (permissible substitution): Payer: 59 | Admitting: Emergency Medicine

## 2018-06-12 NOTE — Telephone Encounter (Signed)
Patient called states he has stomach bug and cannot come in to get allergy injections. Advised he needs to be symptom free for 48 hours prior to coming in to get allergy shots patient verbalized understanding. Will try to make it in next week reviewed protocol for late shot with patient

## 2018-06-13 ENCOUNTER — Telehealth: Payer: Self-pay | Admitting: Emergency Medicine

## 2018-06-13 ENCOUNTER — Ambulatory Visit (INDEPENDENT_AMBULATORY_CARE_PROVIDER_SITE_OTHER): Payer: 59 | Admitting: Emergency Medicine

## 2018-06-13 ENCOUNTER — Encounter: Payer: Self-pay | Admitting: Physician Assistant

## 2018-06-13 ENCOUNTER — Encounter: Payer: Self-pay | Admitting: Emergency Medicine

## 2018-06-13 DIAGNOSIS — J4520 Mild intermittent asthma, uncomplicated: Secondary | ICD-10-CM

## 2018-06-13 DIAGNOSIS — J45909 Unspecified asthma, uncomplicated: Secondary | ICD-10-CM | POA: Diagnosis not present

## 2018-06-13 LAB — PULMONARY FUNCTION TEST
DL/VA % pred: 99 %
DL/VA: 5 ml/min/mmHg/L
DLCO UNC % PRED: 92 %
DLCO UNC: 38.17 ml/min/mmHg
FEF 25-75 Post: 5.01 L/sec
FEF 25-75 Pre: 4.6 L/sec
FEF2575-%Change-Post: 9 %
FEF2575-%Pred-Post: 91 %
FEF2575-%Pred-Pre: 83 %
FEV1-%CHANGE-POST: 1 %
FEV1-%PRED-POST: 95 %
FEV1-%PRED-PRE: 94 %
FEV1-POST: 5.29 L
FEV1-Pre: 5.21 L
FEV1FVC-%Change-Post: 4 %
FEV1FVC-%Pred-Pre: 94 %
FEV6-%Change-Post: -3 %
FEV6-%PRED-POST: 95 %
FEV6-%Pred-Pre: 98 %
FEV6-POST: 6.38 L
FEV6-PRE: 6.59 L
FEV6FVC-%CHANGE-POST: 0 %
FEV6FVC-%PRED-POST: 101 %
FEV6FVC-%PRED-PRE: 100 %
FVC-%Change-Post: -3 %
FVC-%PRED-POST: 94 %
FVC-%Pred-Pre: 97 %
FVC-Post: 6.39 L
FVC-Pre: 6.61 L
PRE FEV6/FVC RATIO: 100 %
Post FEV1/FVC ratio: 83 %
Post FEV6/FVC ratio: 100 %
Pre FEV1/FVC ratio: 79 %
RV % PRED: 53 %
RV: 0.98 L
TLC % PRED: 89 %
TLC: 7.4 L

## 2018-06-13 NOTE — Telephone Encounter (Signed)
I spoke with pt and advised recommendations from Dr. Lamonte Sakai. Nothing further is needed.

## 2018-06-13 NOTE — Telephone Encounter (Signed)
LMOM TCB x1 to get some additional information from patient  Since patient was just seen earlier today, will go ahead and route e-mail to [facility] for advisement  06/13/18 e-mail from patient: Jamorris, Ndiaye to Collene Gobble, MD 06/13/18 12:22 PM  Good Afternoon,  Is it normal to notice some slight discomfort after the breathing test? Nothing crazy just a little bit of a chest ache and a slight ache in the center of my back.

## 2018-06-13 NOTE — Progress Notes (Signed)
Subjective:    Patient ID: Bradley [name], male    DOB: Sep 22, 1996, 22 y.o.   MRN: [id number]  Asthma  He complains of cough, shortness of breath and wheezing. Associated symptoms include a sore throat. Pertinent negatives include no ear pain, fever, headaches, postnasal drip, rhinorrhea, sneezing or trouble swallowing. His past medical history is significant for asthma.   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  He has been followed for mild intermittent to mild persistent asthma, previously managed on Singulair and albuterol as needed.  Recently added Qvar 80 mcg 2 puffs twice a day yesterday.  He reports that he was well until this year, began to have a lot of nasal congestion, drainage, then developed some exertional SOB, minimal wheeze. A lot of sore throat, intermittent coughing. Has been more bothersome last several days with more dyspnea. Albuterol use went up to once a day. He does respond to it - feels less SOB. Some increase in cough and sore throat. He has occasionally had some reflux sx, formerly on rx, just empirically restarted.   ROV 06/13/18 --this follow-up visit for gentleman with a history of environmental allergies on immunotherapy, shellfish allergy, and a clinical syndrome consistent with possible asthma versus upper airway irritation.  At his last visit based on clinical suspicion we started Qvar 80 mcg 2 puffs twice daily.  He also restarted his Prilosec 20 mg daily empirically given the potential for GERD as an exacerbate her of his symptoms.  He underwent pulmonary function testing today which I have reviewed.  This shows overall normal spirometry without a significant bronchodilator response.  There may have been some very slight curve to his prebronchodilator flow volume loop.  His lung volumes show a normal total lung capacity, decreased residual volume consistent  with mild restriction.  His diffusion capacity is normal. He is having less dyspnea, less albuterol use.    Review of Systems  Constitutional: Negative for fever and unexpected weight change.  HENT: Positive for sore throat. Negative for congestion, dental problem, ear pain, nosebleeds, postnasal drip, rhinorrhea, sinus pressure, sneezing and trouble swallowing.   Eyes: Negative for redness and itching.  Respiratory: Positive for cough, shortness of breath and wheezing. Negative for chest tightness.   Cardiovascular: Negative for palpitations and leg swelling.  Gastrointestinal: Negative for nausea and vomiting.  Genitourinary: Negative for dysuria.  Musculoskeletal: Negative for joint swelling.  Skin: Negative for rash.  Neurological: Negative for headaches.  Hematological: Does not bruise/bleed easily.  Psychiatric/Behavioral: Negative for dysphoric mood. The patient is not nervous/anxious.     Past Medical History:  Diagnosis Date  . Anxiety   . Chronic headaches   . Clavicular fracture   . History of chickenpox   . Mild intermittent asthma 06/07/2018  . Shingles    22 years old.     Family History  Problem Relation Age of Onset  . Hypertension Mother   . Diabetes Father        type II  . Migraines Sister   . Diabetes Maternal Grandmother      Social History   Socioeconomic History  . Marital status: Single    Spouse name: Not on file  . Number of children: 0  . Years of education: College  . Highest education level: Not on file  Occupational History  . Occupation: [employer]  . Financial resource strain: Not on file  .  Food insecurity:    Worry: Not on file    Inability: Not on file  . Transportation needs:    Medical: Not on file    Non-medical: Not on file  Tobacco Use  . Smoking status: Never Smoker  . Smokeless tobacco: Never Used  Substance and Sexual Activity  . Alcohol use: Yes    Frequency: Never    Comment: Seldom  . Drug use:  Not Currently    Types: Marijuana    Comment: rare use  . Sexual activity: Yes    Partners: Female  Lifestyle  . Physical activity:    Days per week: Not on file    Minutes per session: Not on file  . Stress: Not on file  Relationships  . Social connections:    Talks on phone: Not on file    Gets together: Not on file    Attends religious service: Not on file    Active member of club or organization: Not on file    Attends meetings of clubs or organizations: Not on file    Relationship status: Not on file  . Intimate partner violence:    Fear of current or ex partner: Not on file    Emotionally abused: Not on file    Physically abused: Not on file    Forced sexual activity: Not on file  Other Topics Concern  . 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  Former [place] use.   Allergies  Allergen Reactions  . Shellfish Allergy Anaphylaxis  . Hydrocodone Itching    Pt states he feels itchy after taking, but it doesn't preclude him taking hydrocodone if needed     Outpatient Medications Prior to Visit  Medication Sig Dispense Refill  . albuterol (PROVENTIL) (2.5 MG/3ML) 0.083% nebulizer solution Take 3 mLs (2.5 mg total) by nebulization every 6 (six) hours as needed for wheezing or shortness of breath. 75 mL 1  . beclomethasone (QVAR REDIHALER) 80 MCG/ACT inhaler Inhale 2 puffs into the lungs 2 (two) times daily. 1 Inhaler 5  . clindamycin (CLEOCIN T) 1 % lotion APPLY TO AFFECTED AREA UP TO TWICE DAILY AS NEEDED    . clonazePAM (KLONOPIN) 0.5 MG disintegrating tablet Take 1 tablet (0.5 mg total) by mouth 2 (two) times daily as needed. 30 tablet 0  . EPINEPHrine (AUVI-Q) 0.3 mg/0.3 mL IJ SOAJ injection Use as directed for severe allergic reaction 2 Device 1  . levocetirizine (XYZAL) 5 MG tablet Take 5 mg by mouth every evening.    .  montelukast (SINGULAIR) 10 MG tablet Take 1 tablet (10 mg total) by mouth at bedtime. 30 tablet 5  . PROAIR HFA 108 (90 Base) MCG/ACT inhaler INHALE 2 PUFFS INTO THE LUNGS EVERY 4 (FOUR) HOURS AS NEEDED FOR WHEEZING OR SHORTNESS OF BREATH. 8.5 Inhaler 1  . propranolol (INDERAL) 40 MG tablet Take 40 mg by mouth 2 (two) times daily.    . cyclobenzaprine (FLEXERIL) 10 MG tablet Take 1 tablet (10 mg total) by mouth at bedtime. 15 tablet 0  . propranolol (INDERAL) 20 MG tablet TAKE 1 TABLET BY MOUTH TWICE A DAY 60 tablet 3   No facility-administered medications prior to visit.         Objective:   Physical Exam Vitals:   06/13/18 7408  BP: 118/86  Pulse: 70  SpO2: 97%  Weight: (!) 356 lb (161.5 kg)  Height: 6' 4.75" (1.949 m)   Gen: Pleasant, well-nourished, in no distress,  normal affect  ENT: No lesions,  mouth clear,  oropharynx clear, no postnasal drip  Neck: No JVD, no stridor  Lungs: No use of accessory muscles, no wheeze, no crackles.   Cardiovascular: RRR, heart sounds normal, no murmur or gallops, no peripheral edema  Musculoskeletal: No deformities, no cyanosis or clubbing  Neuro: alert, non focal  Skin: Warm, no lesions or rash     Assessment & Plan:  Mild intermittent asthma Pulmonary function testing today is practically normal.  There may be some slight curve on the expiratory component of his flow volume loop, but this is minimal.  I think we can try stopping the Qvar.  We will continue to try and address the factors that influence his upper airway irritation including his allergies, possible GERD.  He will keep track of his albuterol use.  If it increases then we may need to reconsider lower airways disease.  If we needed to prove it definitively we could perform a methacholine challenge.  Your pulmonary function testing today shows normal airflows.  This is good news and suggests that there is less evidence for asthma to explain your symptoms. We will try  stopping QVAR Keep albuterol available to use 2 puffs if needed for shortness of breath, chest tightness, wheezing. Please continue your allergy shots as you have been taking them. Please continue Prilosec 20 mg daily. Follow with Dr Lamonte Sakai in 6 months or sooner if you have any problems  Baltazar Apo, MD, PhD 06/13/2018, 10:22 AM [facility] [phone] or if no answer [phone]

## 2018-06-13 NOTE — Telephone Encounter (Signed)
Is this okay to schedule ?   Please advise

## 2018-06-13 NOTE — Telephone Encounter (Signed)
Spoke with pt, advised him that Janett Billow called him about his mychart message but I advised him that RB responded to this message and made sure that he received the message. Pt understood and will call us with an update tomorrow if symptoms persist.

## 2018-06-13 NOTE — Assessment & Plan Note (Signed)
Pulmonary function testing today is practically normal.  There may be some slight curve on the expiratory component of his flow volume loop, but this is minimal.  I think we can try stopping the Qvar.  We will continue to try and address the factors that influence his upper airway irritation including his allergies, possible GERD.  He will keep track of his albuterol use.  If it increases then we may need to reconsider lower airways disease.  If we needed to prove it definitively we could perform a methacholine challenge.  Your pulmonary function testing today shows normal airflows.  This is good news and suggests that there is less evidence for asthma to explain your symptoms. We will try stopping QVAR Keep albuterol available to use 2 puffs if needed for shortness of breath, chest tightness, wheezing. Please continue your allergy shots as you have been taking them. Please continue Prilosec 20 mg daily. Follow with Dr Lamonte Sakai in 6 months or sooner if you have any problems

## 2018-06-13 NOTE — Telephone Encounter (Signed)
Please call him tomorrow to see if he is still having pain. If so please have him come in to get a CXR to insure no evidence for PTX. Thanks.

## 2018-06-13 NOTE — Telephone Encounter (Signed)
This could happen but should not be long-lasting.  If he continues to have symptoms tomorrow then I think he needs to call us back, consider potential causes.  We are presuming that he has some GERD, potentially undertreated.

## 2018-06-13 NOTE — Patient Instructions (Addendum)
Your pulmonary function testing today shows normal airflows.  This is good news and suggests that there is less evidence for asthma to explain your symptoms. We will try stopping QVAR Keep albuterol available to use 2 puffs if needed for shortness of breath, chest tightness, wheezing. Please continue your allergy shots as you have been taking them. Please continue Prilosec 20 mg daily. Follow with Bradley [name] in 6 months or sooner if you have any problems

## 2018-06-13 NOTE — Progress Notes (Signed)
PFT done today. 

## 2018-06-14 ENCOUNTER — Other Ambulatory Visit: Payer: Self-pay

## 2018-06-14 ENCOUNTER — Encounter: Payer: Self-pay | Admitting: Physician Assistant

## 2018-06-14 ENCOUNTER — Ambulatory Visit (INDEPENDENT_AMBULATORY_CARE_PROVIDER_SITE_OTHER): Payer: 59 | Admitting: Physician Assistant

## 2018-06-14 VITALS — BP 118/80 | HR 62 | Temp 98.2°F | Resp 16 | Ht 77.0 in | Wt 360.0 lb

## 2018-06-14 DIAGNOSIS — Z7189 Other specified counseling: Secondary | ICD-10-CM | POA: Diagnosis not present

## 2018-06-14 DIAGNOSIS — Z23 Encounter for immunization: Secondary | ICD-10-CM | POA: Diagnosis not present

## 2018-06-14 NOTE — Patient Instructions (Signed)
Please keep hydrated and get plenty of rest. You will be due for next Gardasil-9 Injection in 2 months.  Take care!

## 2018-06-14 NOTE — Progress Notes (Signed)
Patient presents to clinic today to discuss updating immunizations. Has just given [place] a copy of childhood immunizations for review.   Past Medical History:  Diagnosis Date  . Anxiety   . Chronic headaches   . Clavicular fracture   . History of chickenpox   . Mild intermittent asthma 06/07/2018  . Shingles    22 years old.    Current Outpatient Medications on File Prior to Visit  Medication Sig Dispense Refill  . albuterol (PROVENTIL) (2.5 MG/3ML) 0.083% nebulizer solution Take 3 mLs (2.5 mg total) by nebulization every 6 (six) hours as needed for wheezing or shortness of breath. 75 mL 1  . beclomethasone (QVAR REDIHALER) 80 MCG/ACT inhaler Inhale 2 puffs into the lungs 2 (two) times daily. 1 Inhaler 5  . clindamycin (CLEOCIN T) 1 % lotion APPLY TO AFFECTED AREA UP TO TWICE DAILY AS NEEDED    . clonazePAM (KLONOPIN) 0.5 MG disintegrating tablet Take 1 tablet (0.5 mg total) by mouth 2 (two) times daily as needed. 30 tablet 0  . EPINEPHrine (AUVI-Q) 0.3 mg/0.3 mL IJ SOAJ injection Use as directed for severe allergic reaction 2 Device 1  . levocetirizine (XYZAL) 5 MG tablet Take 5 mg by mouth every evening.    . montelukast (SINGULAIR) 10 MG tablet Take 1 tablet (10 mg total) by mouth at bedtime. 30 tablet 5  . PROAIR HFA 108 (90 Base) MCG/ACT inhaler INHALE 2 PUFFS INTO THE LUNGS EVERY 4 (FOUR) HOURS AS NEEDED FOR WHEEZING OR SHORTNESS OF BREATH. 8.5 Inhaler 1  . propranolol (INDERAL) 40 MG tablet Take 40 mg by mouth 2 (two) times daily.     No current facility-administered medications on file prior to visit.     Allergies  Allergen Reactions  . Shellfish Allergy Anaphylaxis  . Hydrocodone Itching    Pt states he feels itchy after taking, but it doesn't preclude him taking hydrocodone if needed    Family History  Problem Relation Age of Onset  . Hypertension Mother   . Diabetes Father        type II  . Migraines Sister   . Diabetes Maternal Grandmother     Social History     Socioeconomic History  . Marital status: Single    Spouse name: Not on file  . Number of children: 0  . Years of education: College  . Highest education level: Not on file  Occupational History  . Occupation: [employer]  . Financial resource strain: Not on file  . Food insecurity:    Worry: Not on file    Inability: Not on file  . Transportation needs:    Medical: Not on file    Non-medical: Not on file  Tobacco Use  . Smoking status: Never Smoker  . Smokeless tobacco: Never Used  Substance and Sexual Activity  . Alcohol use: Yes    Frequency: Never    Comment: Seldom  . Drug use: Not Currently    Types: Marijuana    Comment: rare use  . Sexual activity: Yes    Partners: Female  Lifestyle  . Physical activity:    Days per week: Not on file    Minutes per session: Not on file  . Stress: Not on file  Relationships  . Social connections:    Talks on phone: Not on file    Gets together: Not on file    Attends religious service: Not on file    Active member of club or  organization: Not on file    Attends meetings of clubs or organizations: Not on file    Relationship status: Not on file  Other Topics Concern  . Not on file  Social History Narrative   Lives alone   Caffeine use: 1 cup coffee per day   Right handed    Review of Systems - See HPI.  All other ROS are negative.  BP 118/80   Pulse 62   Temp 98.2 F (36.8 C) (Oral)   Resp 16   Ht 6\' 5"  (1.956 m)   Wt (!) 360 lb (163.3 kg)   SpO2 98%   BMI 42.69 kg/m   Physical Exam  Constitutional: He appears well-developed and well-nourished.  HENT:  Head: Normocephalic and atraumatic.  Right Ear: External ear normal.  Left Ear: External ear normal.  Nose: Nose normal.  Mouth/Throat: Oropharynx is clear and moist.  Neck: Neck supple.  Cardiovascular: Normal rate, regular rhythm, normal heart sounds and intact distal pulses.  Psychiatric: He has a normal mood and affect.  Vitals  reviewed.  Recent Results (from the past 2160 hour(s))  Pulmonary function test     Status: None (Preliminary result)   Collection Time: 06/13/18  9:00 AM  Result Value Ref Range   FVC-Pre 6.61 L   FVC-%Pred-Pre 97 %   FVC-Post 6.39 L   FVC-%Pred-Post 94 %   FVC-%Change-Post -3 %   FEV1-Pre 5.21 L   FEV1-%Pred-Pre 94 %   FEV1-Post 5.29 L   FEV1-%Pred-Post 95 %   FEV1-%Change-Post 1 %   FEV6-Pre 6.59 L   FEV6-%Pred-Pre 98 %   FEV6-Post 6.38 L   FEV6-%Pred-Post 95 %   FEV6-%Change-Post -3 %   Pre FEV1/FVC ratio 79 %   FEV1FVC-%Pred-Pre 94 %   Post FEV1/FVC ratio 83 %   FEV1FVC-%Change-Post 4 %   Pre FEV6/FVC Ratio 100 %   FEV6FVC-%Pred-Pre 100 %   Post FEV6/FVC ratio 100 %   FEV6FVC-%Pred-Post 101 %   FEV6FVC-%Change-Post 0 %   FEF 25-75 Pre 4.60 L/sec   FEF2575-%Pred-Pre 83 %   FEF 25-75 Post 5.01 L/sec   FEF2575-%Pred-Post 91 %   FEF2575-%Change-Post 9 %   RV 0.98 L   RV % pred 53 %   TLC 7.40 L   TLC % pred 89 %   DLCO unc 38.17 ml/min/mmHg   DLCO unc % pred 92 %   DL/VA 5.00 ml/min/mmHg/L   DL/VA % pred 99 %   Assessment/Plan: 1. Need for vaccination Reviewed recommended vaccines for age and risk. He wishes to proceed with meningitis and Gardasil vaccination. These were given today. Due for 2nd Gardasil-9 in 2 months. - Meningococcal MCV4O(Menveo) - HPV 9-valent vaccine,Recombinat   Leeanne Rio, PA-C

## 2018-06-16 ENCOUNTER — Encounter: Payer: Self-pay | Admitting: Physician Assistant

## 2018-06-16 DIAGNOSIS — N50819 Testicular pain, unspecified: Secondary | ICD-10-CM

## 2018-06-16 DIAGNOSIS — N509 Disorder of male genital organs, unspecified: Secondary | ICD-10-CM

## 2018-06-17 ENCOUNTER — Ambulatory Visit (INDEPENDENT_AMBULATORY_CARE_PROVIDER_SITE_OTHER): Payer: 59

## 2018-06-17 DIAGNOSIS — J309 Allergic rhinitis, unspecified: Secondary | ICD-10-CM | POA: Diagnosis not present

## 2018-06-17 NOTE — Telephone Encounter (Signed)
Per patient last visit he discussed having testicular pain. Per PCP if continues for referral to Urology for further evaluation. Patient declined at [place]. He has requested to proceed with the referral. Referral placed

## 2018-06-18 ENCOUNTER — Encounter: Payer: Self-pay | Admitting: Physician Assistant

## 2018-06-24 ENCOUNTER — Other Ambulatory Visit: Payer: Self-pay | Admitting: Neurology

## 2018-06-24 ENCOUNTER — Encounter: Payer: Self-pay | Admitting: Physician Assistant

## 2018-06-27 ENCOUNTER — Ambulatory Visit: Payer: 59 | Admitting: Emergency Medicine

## 2018-06-27 ENCOUNTER — Ambulatory Visit (INDEPENDENT_AMBULATORY_CARE_PROVIDER_SITE_OTHER): Payer: 59 | Admitting: *Deleted

## 2018-06-27 ENCOUNTER — Encounter: Payer: Self-pay | Admitting: Physician Assistant

## 2018-06-27 DIAGNOSIS — J309 Allergic rhinitis, unspecified: Secondary | ICD-10-CM

## 2018-07-01 ENCOUNTER — Encounter: Payer: Self-pay | Admitting: Physician Assistant

## 2018-07-01 ENCOUNTER — Ambulatory Visit (INDEPENDENT_AMBULATORY_CARE_PROVIDER_SITE_OTHER): Payer: 59 | Admitting: Physician Assistant

## 2018-07-01 ENCOUNTER — Other Ambulatory Visit: Payer: Self-pay

## 2018-07-01 VITALS — BP 110/80 | HR 61 | Temp 97.7°F | Resp 14 | Ht 77.0 in | Wt 360.0 lb

## 2018-07-01 DIAGNOSIS — R7301 Impaired fasting glucose: Secondary | ICD-10-CM

## 2018-07-01 DIAGNOSIS — R7989 Other specified abnormal findings of blood chemistry: Secondary | ICD-10-CM | POA: Diagnosis not present

## 2018-07-01 DIAGNOSIS — G44209 Tension-type headache, unspecified, not intractable: Secondary | ICD-10-CM

## 2018-07-01 LAB — BASIC METABOLIC PANEL
BUN: 13 mg/dL (ref 6–23)
CALCIUM: 9.3 mg/dL (ref 8.4–10.5)
CO2: 26 mEq/L (ref 19–32)
CREATININE: 0.8 mg/dL (ref 0.40–1.50)
Chloride: 104 mEq/L (ref 96–112)
GFR: 127.31 mL/min (ref >60.00)
Glucose, Bld: 100 mg/dL — ABNORMAL HIGH (ref 70–99)
Potassium: 4 mEq/L (ref 3.5–5.1)
Sodium: 137 mEq/L (ref 135–145)

## 2018-07-01 LAB — TESTOSTERONE: Testosterone: 225.47 ng/dL — ABNORMAL LOW (ref 300.00–890.00)

## 2018-07-01 LAB — HEMOGLOBIN A1C: Hgb A1c MFr Bld: 4.9 % (ref 4.6–6.5)

## 2018-07-01 NOTE — Progress Notes (Signed)
Patient presents to clinic today c/o headache off and on over the past couple of days. Notes is mild overall, on the top of his head and feels like a tension and tightness that he can feel in his neck and shoulders sometimes. Denies fever, chills, photophobia, phonophobia, vision changes. Has take Tylenol with some relief.   Patient also here today for labs. Notes seeing a low testosterone level on old records from prior PCP. Would like this retested along with his glucose today. Notes ongoing fatigue and difficulty with weight loss.  Past Medical History:  Diagnosis Date  . Anxiety   . Chronic headaches   . Clavicular fracture   . History of chickenpox   . Mild intermittent asthma 06/07/2018  . Shingles    22 years old.    Current Outpatient Medications on File Prior to Visit  Medication Sig Dispense Refill  . albuterol (PROVENTIL) (2.5 MG/3ML) 0.083% nebulizer solution Take 3 mLs (2.5 mg total) by nebulization every 6 (six) hours as needed for wheezing or shortness of breath. 75 mL 1  . beclomethasone (QVAR REDIHALER) 80 MCG/ACT inhaler Inhale 2 puffs into the lungs 2 (two) times daily. 1 Inhaler 5  . clindamycin (CLEOCIN T) 1 % lotion APPLY TO AFFECTED AREA UP TO TWICE DAILY AS NEEDED    . clonazePAM (KLONOPIN) 0.5 MG disintegrating tablet Take 1 tablet (0.5 mg total) by mouth 2 (two) times daily as needed. 30 tablet 0  . EPINEPHrine (AUVI-Q) 0.3 mg/0.3 mL IJ SOAJ injection Use as directed for severe allergic reaction 2 Device 1  . levocetirizine (XYZAL) 5 MG tablet Take 5 mg by mouth every evening.    . montelukast (SINGULAIR) 10 MG tablet Take 1 tablet (10 mg total) by mouth at bedtime. 30 tablet 5  . PROAIR HFA 108 (90 Base) MCG/ACT inhaler INHALE 2 PUFFS INTO THE LUNGS EVERY 4 (FOUR) HOURS AS NEEDED FOR WHEEZING OR SHORTNESS OF BREATH. 8.5 Inhaler 1  . propranolol (INDERAL) 40 MG tablet Take 40 mg by mouth 2 (two) times daily.     No current facility-administered medications on  file prior to visit.     Allergies  Allergen Reactions  . Shellfish Allergy Anaphylaxis  . Hydrocodone Itching    Pt states he feels itchy after taking, but it doesn't preclude him taking hydrocodone if needed    Family History  Problem Relation Age of Onset  . Hypertension Mother   . Diabetes Father        type II  . Migraines Sister   . Diabetes Maternal Grandmother     Social History   Socioeconomic History  . Marital status: Single    Spouse name: Not on file  . Number of children: 0  . Years of education: College  . Highest education level: Not on file  Occupational History  . Occupation: [employer]  . Financial resource strain: Not on file  . Food insecurity:    Worry: Not on file    Inability: Not on file  . Transportation needs:    Medical: Not on file    Non-medical: Not on file  Tobacco Use  . Smoking status: Never Smoker  . Smokeless tobacco: Never Used  Substance and Sexual Activity  . Alcohol use: Yes    Frequency: Never    Comment: Seldom  . Drug use: Not Currently    Types: Marijuana    Comment: rare use  . Sexual activity: Yes    Partners:  Female  Lifestyle  . Physical activity:    Days per week: Not on file    Minutes per session: Not on file  . Stress: Not on file  Relationships  . Social connections:    Talks on phone: Not on file    Gets together: Not on file    Attends religious service: Not on file    Active member of club or organization: Not on file    Attends meetings of clubs or organizations: Not on file    Relationship status: Not on file  Other Topics Concern  . Not on file  Social History Narrative   Lives alone   Caffeine use: 1 cup coffee per day   Right handed    Review of Systems - See HPI.  All other ROS are negative.  BP 110/80   Pulse 61   Temp 97.7 F (36.5 C) (Oral)   Resp 14   Ht 6\' 5"  (1.956 m)   Wt (!) 360 lb (163.3 kg)   SpO2 98%   BMI 42.69 kg/m   Physical Exam    Constitutional: He appears well-developed and well-nourished.  HENT:  Head: Normocephalic and atraumatic.  Eyes: Pupils are equal, round, and reactive to light. EOM are normal.  Neck: Full passive range of motion without pain. Neck supple.  Cardiovascular: Normal rate, regular rhythm and normal heart sounds.  Pulmonary/Chest: Effort normal and breath sounds normal.  Musculoskeletal:       Cervical back: He exhibits normal range of motion and no tenderness.       Back:  Vitals reviewed.   Recent Results (from the past 2160 hour(s))  Pulmonary function test     Status: None (Preliminary result)   Collection Time: 06/13/18  9:00 AM  Result Value Ref Range   FVC-Pre 6.61 L   FVC-%Pred-Pre 97 %   FVC-Post 6.39 L   FVC-%Pred-Post 94 %   FVC-%Change-Post -3 %   FEV1-Pre 5.21 L   FEV1-%Pred-Pre 94 %   FEV1-Post 5.29 L   FEV1-%Pred-Post 95 %   FEV1-%Change-Post 1 %   FEV6-Pre 6.59 L   FEV6-%Pred-Pre 98 %   FEV6-Post 6.38 L   FEV6-%Pred-Post 95 %   FEV6-%Change-Post -3 %   Pre FEV1/FVC ratio 79 %   FEV1FVC-%Pred-Pre 94 %   Post FEV1/FVC ratio 83 %   FEV1FVC-%Change-Post 4 %   Pre FEV6/FVC Ratio 100 %   FEV6FVC-%Pred-Pre 100 %   Post FEV6/FVC ratio 100 %   FEV6FVC-%Pred-Post 101 %   FEV6FVC-%Change-Post 0 %   FEF 25-75 Pre 4.60 L/sec   FEF2575-%Pred-Pre 83 %   FEF 25-75 Post 5.01 L/sec   FEF2575-%Pred-Post 91 %   FEF2575-%Change-Post 9 %   RV 0.98 L   RV % pred 53 %   TLC 7.40 L   TLC % pred 89 %   DLCO unc 38.17 ml/min/mmHg   DLCO unc % pred 92 %   DL/VA 5.00 ml/min/mmHg/L   DL/VA % pred 99 %    Assessment/Plan: 1. Elevated fasting glucose Noted on prior labs. Has been working on diet. Repeat BMP. Will check A1C as well. - Basic metabolic panel - Hemoglobin A1c  2. Low testosterone Notes finding on prior labs with last PCP. Is symptomatic. Will check AM testosterone level today. - Testosterone  3. Tension headache Supportive measures and OTC medications  reviewed with patient. Examination without concerning findings. Follow-up if not resolving.    Leeanne Rio, PA-C

## 2018-07-01 NOTE — Patient Instructions (Signed)
Take an Excedrin to help with acute headache. Get some icy hot to place on the neck and shoulders.  Massages can be very beneficial.  Keep a well-balanced diet and stay very hydrated.  Let me know if this keeps recurring.   Please go to the lab today for blood work.  I will call you with your results. We will alter treatment regimen(s) if indicated by your results.     Tension Headache A tension headache is pain, pressure, or aching that is felt over the front and sides of your head. These headaches can last from 30 minutes to several days. Follow these instructions at home: Managing pain  Take over-the-counter and prescription medicines only as told by your doctor.  Lie down in a dark, quiet room when you have a headache.  If directed, apply ice to your head and neck area: ? Put ice in a plastic bag. ? Place a towel between your skin and the bag. ? Leave the ice on for 20 minutes, 2-3 times per day.  Use a heating pad or a hot shower to apply heat to your head and neck area as told by your doctor. Eating and drinking  Eat meals on a regular schedule.  Do not drink a lot of alcohol.  Do not use a lot of caffeine, or stop using caffeine. General instructions  Keep all follow-up visits as told by your doctor. This is important.  Keep a journal to find out if certain things bring on headaches. For example, write down: ? What you eat and drink. ? How much sleep you get. ? Any change to your diet or medicines.  Try getting a massage, or doing other things that help you to relax.  Lessen stress.  Sit up straight. Do not tighten (tense) your muscles.  Do not use tobacco products. This includes cigarettes, chewing tobacco, or e-cigarettes. If you need help quitting, ask your doctor.  Exercise regularly as told by your doctor.  Get enough sleep. This may mean 7-9 hours of sleep. Contact a doctor if:  Your symptoms are not helped by medicine.  You have a headache that  feels different from your usual headache.  You feel sick to your stomach (nauseous) or you throw up (vomit).  You have a fever. Get help right away if:  Your headache becomes very bad.  You keep throwing up.  You have a stiff neck.  You have trouble seeing.  You have trouble speaking.  You have pain in your eye or ear.  Your muscles are weak or you lose muscle control.  You lose your balance or you have trouble walking.  You feel like you will pass out (faint) or you pass out.  You have confusion. This information is not intended to replace advice given to you by your health care provider. Make sure you discuss any questions you have with your health care provider. Document Released: 12/06/2009 Document Revised: 05/11/2016 Document Reviewed: 01/04/2015 Elsevier Interactive Patient Education  [employer].

## 2018-07-02 ENCOUNTER — Encounter: Payer: Self-pay | Admitting: Physician Assistant

## 2018-07-02 ENCOUNTER — Other Ambulatory Visit: Payer: Self-pay | Admitting: Neurology

## 2018-07-02 ENCOUNTER — Other Ambulatory Visit: Payer: Self-pay | Admitting: Physician Assistant

## 2018-07-02 MED ORDER — TIZANIDINE HCL 4 MG PO TABS: 4.0000 mg | ORAL_TABLET | Freq: Every day | ORAL | 0 refills | Status: DC

## 2018-07-02 MED ORDER — DEXAMETHASONE 2 MG PO TABS: ORAL_TABLET | ORAL | 0 refills | Status: DC

## 2018-07-03 DIAGNOSIS — J301 Allergic rhinitis due to pollen: Secondary | ICD-10-CM | POA: Diagnosis not present

## 2018-07-03 NOTE — Progress Notes (Signed)
EXP. 07/05/19

## 2018-07-05 ENCOUNTER — Ambulatory Visit (INDEPENDENT_AMBULATORY_CARE_PROVIDER_SITE_OTHER): Payer: 59 | Admitting: Neurology

## 2018-07-05 ENCOUNTER — Encounter: Payer: Self-pay | Admitting: Neurology

## 2018-07-05 VITALS — BP 141/89 | HR 61 | Ht 78.0 in | Wt 365.0 lb

## 2018-07-05 DIAGNOSIS — G4489 Other headache syndrome: Secondary | ICD-10-CM

## 2018-07-05 MED ORDER — RIZATRIPTAN BENZOATE 10 MG PO TABS
10.0000 mg | ORAL_TABLET | Freq: Three times a day (TID) | ORAL | 3 refills | Status: DC | PRN
Start: 2018-07-05 — End: 2019-01-13

## 2018-07-05 NOTE — Progress Notes (Addendum)
Reason for visit: Headache  Bradley [name] is an 22 y.o. male  History of present illness:  Bradley [name] is a 22 year old right-handed white male with a history of headache.  The patient had an abnormal MRI of the brain done in February 2019 showing an area of enhancement in the left parasagittal frontal lobe.  A repeat MRI done in July 2019 showed that there was no longer any enhancement in this area.  A small single focus of T2 signal abnormality is seen in the right parietal lobe and is nonspecific.  The patient has done very well with his headaches until just recently.  The patient indicates that he has had no headaches whatsoever until 7 days prior to this visit.  The patient began having daily headaches at that point, he was given a 3-day course of Decadron which seems to have significantly improved his pain.  The patient indicates that he has been diagnosed with a low testosterone level, he has been trying to lose weight but has been unsuccessful so far.  The patient is considering going on testosterone supplementation.  The patient also reports intermittent left ear pain.  The patient returns to this office for an evaluation.  Past Medical History:  Diagnosis Date  . Anxiety   . Chronic headaches   . Clavicular fracture   . History of chickenpox   . Mild intermittent asthma 06/07/2018  . Shingles    22 years old.    Past Surgical History:  Procedure Laterality Date  . WISDOM TOOTH EXTRACTION      Family History  Problem Relation Age of Onset  . Hypertension Mother   . Diabetes Father        type II  . Migraines Sister   . Diabetes Maternal Grandmother     Social history:  reports that he has never smoked. He has never used smokeless tobacco. He reports that he drinks alcohol. He reports that he has current or past drug history. Drug: Marijuana.    Allergies  Allergen Reactions  . Shellfish Allergy Anaphylaxis  . Hydrocodone Itching    Pt states he feels itchy after  taking, but it doesn't preclude him taking hydrocodone if needed    Medications:  Prior to Admission medications   Medication Sig Start Date End Date Taking? Authorizing Provider  albuterol (PROVENTIL) (2.5 MG/3ML) 0.083% nebulizer solution Take 3 mLs (2.5 mg total) by nebulization every 6 (six) hours as needed for wheezing or shortness of breath. 04/15/18  Yes Padgett, Rae Halsted, MD  aspirin-acetaminophen-caffeine (EXCEDRIN MIGRAINE) [phone] MG tablet Take 1 tablet by mouth every 6 (six) hours as needed for headache.   Yes [provider]  beclomethasone (QVAR REDIHALER) 80 MCG/ACT inhaler Inhale 2 puffs into the lungs 2 (two) times daily. 06/06/18  Yes Padgett, Rae Halsted, MD  clindamycin (CLEOCIN T) 1 % lotion APPLY TO AFFECTED AREA UP TO TWICE DAILY AS NEEDED 03/08/18  Yes [provider]  clonazePAM (KLONOPIN) 0.5 MG disintegrating tablet Take 1 tablet (0.5 mg total) by mouth 2 (two) times daily as needed. 05/28/18  Yes Brunetta Jeans, PA-C  dexamethasone (DECADRON) 2 MG tablet Take 3 tablets the first day, 2 the second and 1 the third day 07/02/18  Yes Kathrynn Ducking, MD  EPINEPHrine (AUVI-Q) 0.3 mg/0.3 mL IJ SOAJ injection Use as directed for severe allergic reaction 01/30/18  Yes Padgett, Rae Halsted, MD  levocetirizine (XYZAL) 5 MG tablet Take 5 mg by mouth every evening.  Yes [provider]  montelukast (SINGULAIR) 10 MG tablet Take 1 tablet (10 mg total) by mouth at bedtime. 01/31/18  Yes Padgett, Rae Halsted, MD  PROAIR HFA 108 [phone] Base) MCG/ACT inhaler INHALE 2 PUFFS INTO THE LUNGS EVERY 4 (FOUR) HOURS AS NEEDED FOR WHEEZING OR SHORTNESS OF BREATH. 03/11/18  Yes Padgett, Rae Halsted, MD  propranolol (INDERAL) 40 MG tablet Take 40 mg by mouth 2 (two) times daily.   Yes [provider]  tiZANidine (ZANAFLEX) 4 MG tablet Take 1 tablet (4 mg total) by mouth at bedtime. 07/02/18  Yes Brunetta Jeans, PA-C    ROS:  Out of a  complete 14 system review of symptoms, the patient complains only of the following symptoms, and all other reviewed systems are negative.  Ear pain Testicular pain Headache  Blood pressure (!) 141/89, pulse 61, height 6\' 6"  (1.981 m), weight (!) 365 lb (165.6 kg).  Physical Exam  General: The patient is alert and cooperative at the time of the examination.  The patient is markedly obese.  Ears: Tympanic membranes are clear bilaterally.  Skin: No significant peripheral edema is noted.   Neurologic Exam  Mental status: The patient is alert and oriented x 3 at the time of the examination. The patient has apparent normal recent and remote memory, with an apparently normal attention span and concentration ability.   Cranial nerves: Facial symmetry is present. Speech is normal, no aphasia or dysarthria is noted. Extraocular movements are full. Visual fields are full.  Motor: The patient has good strength in all 4 extremities.  Sensory examination: Soft touch sensation is symmetric on the face, arms, and legs.  Coordination: The patient has good finger-nose-finger and heel-to-shin bilaterally.  Gait and station: The patient has a normal gait. Tandem gait is normal. Romberg is negative. No drift is seen.  Reflexes: Deep tendon reflexes are symmetric.   MRI brain 04/04/18:  IMPRESSION:   MRI brain (with and without) demonstrating: - Stable non-specific right parietal, periventricular T2 hyperintensity. - Previously noted left frontal enhancing lesion is no longer seen.  * MRI scan images were reviewed online. I agree with the written report.     Assessment/Plan:  1.  Migraine headache  The patient will be given a prescription for Maxalt to take if needed for the headache.  The patient may also take Excedrin Migraine which he finds is helpful.  The patient will follow-up to this office in about 6 months.  If the headaches become more frequent, he is to contact our office,  he is on propranolol taking 40 mg twice daily.  Jill Alexanders MD 07/05/2018 9:31 AM  [facility] [address] [place], [place] [address]  Phone [phone] Fax [phone]

## 2018-07-07 ENCOUNTER — Encounter: Payer: Self-pay | Admitting: Neurology

## 2018-07-08 ENCOUNTER — Encounter: Payer: Self-pay | Admitting: Physician Assistant

## 2018-07-08 ENCOUNTER — Other Ambulatory Visit: Payer: Self-pay | Admitting: Neurology

## 2018-07-08 MED ORDER — PREDNISONE 5 MG PO TABS: ORAL_TABLET | ORAL | 0 refills | Status: DC

## 2018-07-10 DIAGNOSIS — Z6841 Body Mass Index (BMI) 40.0 and over, adult: Secondary | ICD-10-CM

## 2018-07-10 DIAGNOSIS — G43009 Migraine without aura, not intractable, without status migrainosus: Secondary | ICD-10-CM | POA: Insufficient documentation

## 2018-07-11 ENCOUNTER — Ambulatory Visit (INDEPENDENT_AMBULATORY_CARE_PROVIDER_SITE_OTHER): Payer: 59 | Admitting: *Deleted

## 2018-07-11 DIAGNOSIS — J309 Allergic rhinitis, unspecified: Secondary | ICD-10-CM | POA: Diagnosis not present

## 2018-07-18 ENCOUNTER — Ambulatory Visit (INDEPENDENT_AMBULATORY_CARE_PROVIDER_SITE_OTHER): Payer: 59 | Admitting: *Deleted

## 2018-07-18 DIAGNOSIS — J309 Allergic rhinitis, unspecified: Secondary | ICD-10-CM

## 2018-07-25 ENCOUNTER — Ambulatory Visit (INDEPENDENT_AMBULATORY_CARE_PROVIDER_SITE_OTHER): Payer: 59 | Admitting: *Deleted

## 2018-07-25 ENCOUNTER — Encounter: Payer: Self-pay | Admitting: Physician Assistant

## 2018-07-25 DIAGNOSIS — J309 Allergic rhinitis, unspecified: Secondary | ICD-10-CM

## 2018-07-27 ENCOUNTER — Other Ambulatory Visit: Payer: Self-pay | Admitting: Allergy

## 2018-07-29 ENCOUNTER — Encounter: Payer: Self-pay | Admitting: Cardiology

## 2018-07-29 ENCOUNTER — Encounter

## 2018-07-29 ENCOUNTER — Ambulatory Visit (INDEPENDENT_AMBULATORY_CARE_PROVIDER_SITE_OTHER): Payer: 59 | Admitting: Cardiology

## 2018-07-29 ENCOUNTER — Other Ambulatory Visit: Payer: Self-pay | Admitting: Neurology

## 2018-07-29 VITALS — BP 110/62 | HR 65 | Ht 78.0 in | Wt 362.0 lb

## 2018-07-29 DIAGNOSIS — R0789 Other chest pain: Secondary | ICD-10-CM | POA: Diagnosis not present

## 2018-07-29 DIAGNOSIS — F419 Anxiety disorder, unspecified: Secondary | ICD-10-CM

## 2018-07-29 NOTE — Patient Instructions (Signed)

## 2018-07-29 NOTE — Progress Notes (Signed)
Cardiology Office Note:    Date:  07/29/2018   ID:  Bradley [name], DOB June 02, 1996, MRN [id number]  PCP:  Brunetta Jeans, PA-C  Cardiologist:  Jenne Campus, MD    Referring MD: Brunetta Jeans, PA-C   Chief Complaint  Patient presents with  . Follow-up  I am still having some chest pain  History of Present Illness:    Bradley [name] is a 22 y.o. male with anxiety as well as some atypical chest pain still describe to have some sharp stabbing brief not related to exercise.  Stress test negative we spent time talking about risk factors modifications.  He gained some weight but again committed to go back to exercises on the regular basis some changes in his diet.  His triglycerides are elevated I told him exercising on the regular basis this can improve the situation.  Past Medical History:  Diagnosis Date  . Anxiety   . Chronic headaches   . Clavicular fracture   . History of chickenpox   . Mild intermittent asthma 06/07/2018  . Shingles    22 years old.    Past Surgical History:  Procedure Laterality Date  . WISDOM TOOTH EXTRACTION      Current Medications: Current Meds  Medication Sig  . albuterol (PROVENTIL) (2.5 MG/3ML) 0.083% nebulizer solution Take 3 mLs (2.5 mg total) by nebulization every 6 (six) hours as needed for wheezing or shortness of breath.  Marland Kitchen aspirin-acetaminophen-caffeine (EXCEDRIN MIGRAINE) 250-250-65 MG tablet Take 1 tablet by mouth every 6 (six) hours as needed for headache.  . clonazePAM (KLONOPIN) 0.5 MG disintegrating tablet Take 1 tablet (0.5 mg total) by mouth 2 (two) times daily as needed.  Marland Kitchen EPINEPHrine (AUVI-Q) 0.3 mg/0.3 mL IJ SOAJ injection Use as directed for severe allergic reaction  . levocetirizine (XYZAL) 5 MG tablet Take 5 mg by mouth every evening.  . montelukast (SINGULAIR) 10 MG tablet TAKE 1 TABLET BY MOUTH EVERYDAY AT BEDTIME  . PROAIR HFA 108 (90 Base) MCG/ACT inhaler INHALE 2 PUFFS INTO THE LUNGS EVERY 4 (FOUR) HOURS AS  NEEDED FOR WHEEZING OR SHORTNESS OF BREATH.  . propranolol (INDERAL) 40 MG tablet Take 40 mg by mouth 2 (two) times daily.  . rizatriptan (MAXALT) 10 MG tablet Take 1 tablet (10 mg total) by mouth 3 (three) times daily as needed for migraine.     Allergies:   Shellfish allergy and Hydrocodone   Social History   Socioeconomic History  . Marital status: Single    Spouse name: Not on file  . Number of children: 0  . Years of education: College  . Highest education level: Not on file  Occupational History  . Occupation: [employer]  . Financial resource strain: Not on file  . Food insecurity:    Worry: Not on file    Inability: Not on file  . Transportation needs:    Medical: Not on file    Non-medical: Not on file  Tobacco Use  . Smoking status: Never Smoker  . Smokeless tobacco: Never Used  Substance and Sexual Activity  . Alcohol use: Yes    Frequency: Never    Comment: Seldom  . Drug use: Not Currently    Types: Marijuana    Comment: rare use  . Sexual activity: Yes    Partners: Female  Lifestyle  . Physical activity:    Days per week: Not on file    Minutes per session: Not on file  . Stress:  Not on file  Relationships  . Social connections:    Talks on phone: Not on file    Gets together: Not on file    Attends religious service: Not on file    Active member of club or organization: Not on file    Attends meetings of clubs or organizations: Not on file    Relationship status: Not on file  Other Topics Concern  . Not on file  Social History Narrative   Lives alone   Caffeine use: 1 cup coffee per day   Right handed      Family History: The patient's family history includes Diabetes in his father and maternal grandmother; Hypertension in his mother; Migraines in his sister. ROS:   Please see the history of present illness.    All 14 point review of systems negative except as described per history of present illness  EKGs/Labs/Other  Studies Reviewed:      Recent Labs: 11/27/2017: TSH 1.02 12/14/2017: ALT 16; Hemoglobin 14.7; Platelets 199.0 07/01/2018: BUN 13; Creatinine, Ser 0.80; Potassium 4.0; Sodium 137  Recent Lipid Panel No results found for: CHOL, TRIG, HDL, CHOLHDL, VLDL, LDLCALC, LDLDIRECT  Physical Exam:    VS:  BP 110/62   Pulse 65   Ht 6\' 6"  (1.981 m)   Wt (!) 362 lb (164.2 kg)   SpO2 98%   BMI 41.83 kg/m     Wt Readings from Last 3 Encounters:  07/29/18 (!) 362 lb (164.2 kg)  07/05/18 (!) 365 lb (165.6 kg)  07/01/18 (!) 360 lb (163.3 kg)     GEN:  Well nourished, well developed in no acute distress HEENT: Normal NECK: No JVD; No carotid bruits LYMPHATICS: No lymphadenopathy CARDIAC: RRR, no murmurs, no rubs, no gallops RESPIRATORY:  Clear to auscultation without rales, wheezing or rhonchi  ABDOMEN: Soft, non-tender, non-distended MUSCULOSKELETAL:  No edema; No deformity  SKIN: Warm and dry LOWER EXTREMITIES: no swelling NEUROLOGIC:  Alert and oriented x 3 PSYCHIATRIC:  Normal affect   ASSESSMENT:    1. Atypical chest pain   2. Anxiety    PLAN:    In order of problems listed above:  1. Atypical chest pain not related to his heart.  Encouraged him to modify his risk factors for coronary artery disease. 2. Anxiety stable from that point review.   Medication Adjustments/Labs and Tests Ordered: Current medicines are reviewed at length with the patient today.  Concerns regarding medicines are outlined above.  No orders of the defined types were placed in this encounter.  Medication changes: No orders of the defined types were placed in this encounter.   Signed, Park Liter, MD, [facility] 07/29/2018 4:47 PM    [place]

## 2018-08-01 ENCOUNTER — Ambulatory Visit (INDEPENDENT_AMBULATORY_CARE_PROVIDER_SITE_OTHER): Payer: 59 | Admitting: *Deleted

## 2018-08-01 DIAGNOSIS — J309 Allergic rhinitis, unspecified: Secondary | ICD-10-CM | POA: Diagnosis not present

## 2018-08-13 ENCOUNTER — Telehealth: Payer: Self-pay | Admitting: *Deleted

## 2018-08-13 MED ORDER — PROPRANOLOL HCL 40 MG PO TABS: 40.0000 mg | ORAL_TABLET | Freq: Two times a day (BID) | ORAL | 1 refills | Status: DC

## 2018-08-13 NOTE — Telephone Encounter (Signed)
Inderal escribed to [facility] in response to faxed request from them/fim

## 2018-08-14 ENCOUNTER — Ambulatory Visit (INDEPENDENT_AMBULATORY_CARE_PROVIDER_SITE_OTHER): Payer: 59 | Admitting: *Deleted

## 2018-08-14 DIAGNOSIS — J309 Allergic rhinitis, unspecified: Secondary | ICD-10-CM | POA: Diagnosis not present

## 2018-08-21 ENCOUNTER — Other Ambulatory Visit: Payer: Self-pay | Admitting: Neurology

## 2018-08-27 ENCOUNTER — Encounter: Payer: Self-pay | Admitting: Physician Assistant

## 2018-08-28 ENCOUNTER — Encounter: Payer: Self-pay | Admitting: Physician Assistant

## 2018-08-28 ENCOUNTER — Ambulatory Visit (INDEPENDENT_AMBULATORY_CARE_PROVIDER_SITE_OTHER): Payer: 59 | Admitting: Physician Assistant

## 2018-08-28 ENCOUNTER — Other Ambulatory Visit: Payer: Self-pay

## 2018-08-28 VITALS — BP 110/80 | HR 57 | Temp 98.3°F | Resp 16 | Ht 78.0 in | Wt 363.0 lb

## 2018-08-28 DIAGNOSIS — M940 Chondrocostal junction syndrome [Tietze]: Secondary | ICD-10-CM | POA: Diagnosis not present

## 2018-08-28 NOTE — Progress Notes (Signed)
Patient presents to clinic today c/o 2 days of left upper abdominal and chest discomfort.  Patient states pain comes and intermittent spells, lasting less than a minute each time.  Is reproduced by movement of his torso.  Also notes pain if he presses on his chest.  Denies any known trauma or injury.  He did repair his ceiling this weekend prior to symptom onset.  Denies any heartburn, indigestion, nausea or vomiting.  Denies change to bowel or bladder habits.  Denies fever or chills.  Has taken ibuprofen with some relief of symptoms.  Past Medical History:  Diagnosis Date  . Anxiety   . Chronic headaches   . Clavicular fracture   . History of chickenpox   . Mild intermittent asthma 06/07/2018  . Shingles    22 years old.    Current Outpatient Medications on File Prior to Visit  Medication Sig Dispense Refill  . albuterol (PROVENTIL) (2.5 MG/3ML) 0.083% nebulizer solution Take 3 mLs (2.5 mg total) by nebulization every 6 (six) hours as needed for wheezing or shortness of breath. 75 mL 1  . aspirin-acetaminophen-caffeine (EXCEDRIN MIGRAINE) 250-250-65 MG tablet Take 1 tablet by mouth every 6 (six) hours as needed for headache.    . clonazePAM (KLONOPIN) 0.5 MG disintegrating tablet Take 1 tablet (0.5 mg total) by mouth 2 (two) times daily as needed. 30 tablet 0  . EPINEPHrine (AUVI-Q) 0.3 mg/0.3 mL IJ SOAJ injection Use as directed for severe allergic reaction 2 Device 1  . levocetirizine (XYZAL) 5 MG tablet Take 5 mg by mouth every evening.    . montelukast (SINGULAIR) 10 MG tablet TAKE 1 TABLET BY MOUTH EVERYDAY AT BEDTIME 90 tablet 1  . PROAIR HFA 108 (90 Base) MCG/ACT inhaler INHALE 2 PUFFS INTO THE LUNGS EVERY 4 (FOUR) HOURS AS NEEDED FOR WHEEZING OR SHORTNESS OF BREATH. 8.5 Inhaler 1  . propranolol (INDERAL) 40 MG tablet Take 1 tablet (40 mg total) by mouth 2 (two) times daily. 180 tablet 1  . rizatriptan (MAXALT) 10 MG tablet Take 1 tablet (10 mg total) by mouth 3 (three) times daily  as needed for migraine. 10 tablet 3   No current facility-administered medications on file prior to visit.     Allergies  Allergen Reactions  . Shellfish Allergy Anaphylaxis  . Hydrocodone Itching    Pt states he feels itchy after taking, but it doesn't preclude him taking hydrocodone if needed    Family History  Problem Relation Age of Onset  . Hypertension Mother   . Diabetes Father        type II  . Migraines Sister   . Diabetes Maternal Grandmother     Social History   Socioeconomic History  . Marital status: Single    Spouse name: Not on file  . Number of children: 0  . Years of education: College  . Highest education level: Not on file  Occupational History  . Occupation: [employer]  . Financial resource strain: Not on file  . Food insecurity:    Worry: Not on file    Inability: Not on file  . Transportation needs:    Medical: Not on file    Non-medical: Not on file  Tobacco Use  . Smoking status: Never Smoker  . Smokeless tobacco: Never Used  Substance and Sexual Activity  . Alcohol use: Yes    Frequency: Never    Comment: Seldom  . Drug use: Not Currently    Types: Marijuana  Comment: rare use  . Sexual activity: Yes    Partners: Female  Lifestyle  . Physical activity:    Days per week: Not on file    Minutes per session: Not on file  . Stress: Not on file  Relationships  . Social connections:    Talks on phone: Not on file    Gets together: Not on file    Attends religious service: Not on file    Active member of club or organization: Not on file    Attends meetings of clubs or organizations: Not on file    Relationship status: Not on file  Other Topics Concern  . Not on file  Social History Narrative   Lives alone   Caffeine use: 1 cup coffee per day   Right handed    Review of Systems - See HPI.  All other ROS are negative.  BP 110/80   Pulse (!) 57   Temp 98.3 F (36.8 C) (Oral)   Resp 16   Ht 6\' 6"  (1.981 m)    Wt (!) 363 lb (164.7 kg)   SpO2 98%   BMI 41.95 kg/m   Physical Exam  Constitutional: He is oriented to person, place, and time. He appears well-developed and well-nourished.  HENT:  Head: Normocephalic and atraumatic.  Mouth/Throat: Oropharynx is clear and moist.  Neck: Neck supple.  Cardiovascular: Normal rate and regular rhythm.  Pulmonary/Chest: Effort normal. He exhibits tenderness (left lateral chest wall).  Abdominal: Normal appearance and bowel sounds are normal. There is no tenderness.  Neurological: He is alert and oriented to person, place, and time.  Vitals reviewed.  Recent Results (from the past 2160 hour(s))  Pulmonary function test     Status: None (Preliminary result)   Collection Time: 06/13/18  9:00 AM  Result Value Ref Range   FVC-Pre 6.61 L   FVC-%Pred-Pre 97 %   FVC-Post 6.39 L   FVC-%Pred-Post 94 %   FVC-%Change-Post -3 %   FEV1-Pre 5.21 L   FEV1-%Pred-Pre 94 %   FEV1-Post 5.29 L   FEV1-%Pred-Post 95 %   FEV1-%Change-Post 1 %   FEV6-Pre 6.59 L   FEV6-%Pred-Pre 98 %   FEV6-Post 6.38 L   FEV6-%Pred-Post 95 %   FEV6-%Change-Post -3 %   Pre FEV1/FVC ratio 79 %   FEV1FVC-%Pred-Pre 94 %   Post FEV1/FVC ratio 83 %   FEV1FVC-%Change-Post 4 %   Pre FEV6/FVC Ratio 100 %   FEV6FVC-%Pred-Pre 100 %   Post FEV6/FVC ratio 100 %   FEV6FVC-%Pred-Post 101 %   FEV6FVC-%Change-Post 0 %   FEF 25-75 Pre 4.60 L/sec   FEF2575-%Pred-Pre 83 %   FEF 25-75 Post 5.01 L/sec   FEF2575-%Pred-Post 91 %   FEF2575-%Change-Post 9 %   RV 0.98 L   RV % pred 53 %   TLC 7.40 L   TLC % pred 89 %   DLCO unc 38.17 ml/min/mmHg   DLCO unc % pred 92 %   DL/VA 5.00 ml/min/mmHg/L   DL/VA % pred 99 %  Testosterone     Status: Abnormal   Collection Time: 07/01/18  9:35 AM  Result Value Ref Range   Testosterone 225.47 (L) 300.00 - 890.00 ng/dL  Basic metabolic panel     Status: Abnormal   Collection Time: 07/01/18  9:35 AM  Result Value Ref Range   Sodium 137 135 - 145 mEq/L    Potassium 4.0 3.5 - 5.1 mEq/L   Chloride 104 96 - 112 mEq/L  CO2 26 19 - 32 mEq/L   Glucose, Bld 100 (H) 70 - 99 mg/dL   BUN 13 6 - 23 mg/dL   Creatinine, Ser 0.80 0.40 - 1.50 mg/dL   Calcium 9.3 8.4 - 10.5 mg/dL   GFR 127.31 >60.00 mL/min  Hemoglobin A1c     Status: None   Collection Time: 07/01/18  9:35 AM  Result Value Ref Range   Hgb A1c MFr Bld 4.9 4.6 - 6.5 %    Comment: Glycemic Control Guidelines for People with Diabetes:Non Diabetic:  <6%Goal of Therapy: <7%Additional Action Suggested:  >8%    Assessment/Plan: 1. Costochondritis Reproducible with palpation of the costal muscles.  Also reproduced with range of motion of torso.  No alarm signs or symptoms.  Will start Aleve twice daily.  Supportive measures and OTC medications reviewed.  Follow-up if not easing off over the next 4 to 5 days.   Leeanne Rio, PA-C

## 2018-08-28 NOTE — Patient Instructions (Signed)
Avoid heavy lifting or overexertion. Apply heating pad to the area for 10-15 minute intervals a few times per day. Take an Aleve twice daily for the rest of the week to calm inflammation.  Follow-up if symptoms are not resolving or if new symptoms develop.

## 2018-09-03 ENCOUNTER — Encounter: Payer: Self-pay | Admitting: Physician Assistant

## 2018-09-04 ENCOUNTER — Ambulatory Visit: Payer: 59 | Admitting: Physician Assistant

## 2018-09-04 ENCOUNTER — Encounter: Payer: Self-pay | Admitting: Physician Assistant

## 2018-09-04 ENCOUNTER — Ambulatory Visit (INDEPENDENT_AMBULATORY_CARE_PROVIDER_SITE_OTHER): Payer: 59 | Admitting: Physician Assistant

## 2018-09-04 VITALS — BP 110/70 | HR 90 | Temp 98.8°F | Ht 78.0 in | Wt 359.4 lb

## 2018-09-04 DIAGNOSIS — R0789 Other chest pain: Secondary | ICD-10-CM | POA: Diagnosis not present

## 2018-09-04 DIAGNOSIS — J020 Streptococcal pharyngitis: Secondary | ICD-10-CM

## 2018-09-04 LAB — POCT INFLUENZA A/B
Influenza A, POC: NEGATIVE
Influenza B, POC: NEGATIVE

## 2018-09-04 LAB — POCT RAPID STREP A (OFFICE): Rapid Strep A Screen: POSITIVE — AB

## 2018-09-04 MED ORDER — AMOXICILLIN 875 MG PO TABS: 875.0000 mg | ORAL_TABLET | Freq: Two times a day (BID) | ORAL | 0 refills | Status: DC

## 2018-09-04 MED ORDER — BENZONATATE 100 MG PO CAPS: 100.0000 mg | ORAL_CAPSULE | Freq: Three times a day (TID) | ORAL | 1 refills | Status: DC | PRN

## 2018-09-04 NOTE — Patient Instructions (Addendum)
It was great to see you!  Flu test negative.  Strep test positive.  Use medication as prescribed: Amoxicillin antibiotic and tessalon perles as needed for cough  Delsym cough syrup (may do generic) during the day for cough  Push fluids and get plenty of rest. Please return if you are not improving as expected, or if you have high fevers (>101.5) or difficulty swallowing or worsening productive cough.  Call clinic with questions.  I hope you start feeling better soon!

## 2018-09-04 NOTE — Progress Notes (Signed)
Bradley [name] is a 22 y.o. male here for a new problem.  I acted as a Education administrator for [employer], PA-C Anselmo Pickler, LPN  History of Present Illness:   Chief Complaint  Patient presents with  . Sinus Problem    Sinus Problem  This is a new problem. Episode onset: Started Sunday. The problem has been gradually worsening since onset. The maximum temperature recorded prior to his arrival was 101 - 101.9 F (Yesterday). The fever has been present for 1 to 2 days. His pain is at a severity of 6/10. The pain is moderate. Associated symptoms include chills, congestion (Nasal drainage yellow/green), coughing (expectorating yellow sputum), ear pain (Bilateral), headaches, a hoarse voice, sinus pressure and a sore throat. Pertinent negatives include no neck pain, shortness of breath or swollen glands. (Nausea, no vomiting.) Past treatments include acetaminophen (Mucinex). The treatment provided no relief.   Symptoms started with a really bad sore throat. Throat has slightly improved.  He is drinking plenty of fluids but is concerned because he is having some intermittent chest tightness. He admits to having anxiety about this. He states that he had a cardiology work-up and was told that everything was normal.  States that this is his typical "chest tightness" that he has and it is a little more frequent 2/2 cough. He has some "burning" as well. No radiation of pain. No jaw pain.   Past Medical History:  Diagnosis Date  . Anxiety   . Chronic headaches   . Clavicular fracture   . History of chickenpox   . Mild intermittent asthma 06/07/2018  . Shingles    22 years old.     Social History   Socioeconomic History  . Marital status: Single    Spouse name: Not on file  . Number of children: 0  . Years of education: College  . Highest education level: Not on file  Occupational History  . Occupation: [employer]  . Financial resource strain: Not on file  . Food insecurity:     Worry: Not on file    Inability: Not on file  . Transportation needs:    Medical: Not on file    Non-medical: Not on file  Tobacco Use  . Smoking status: Never Smoker  . Smokeless tobacco: Never Used  Substance and Sexual Activity  . Alcohol use: Yes    Frequency: Never    Comment: Seldom  . Drug use: Not Currently    Types: Marijuana    Comment: rare use  . Sexual activity: Yes    Partners: Female  Lifestyle  . Physical activity:    Days per week: Not on file    Minutes per session: Not on file  . Stress: Not on file  Relationships  . Social connections:    Talks on phone: Not on file    Gets together: Not on file    Attends religious service: Not on file    Active member of club or organization: Not on file    Attends meetings of clubs or organizations: Not on file    Relationship status: Not on file  . Intimate partner violence:    Fear of current or ex partner: Not on file    Emotionally abused: Not on file    Physically abused: Not on file    Forced sexual activity: Not on file  Other Topics Concern  . Not on file  Social History Narrative   Lives alone   Caffeine  use: 1 cup coffee per day   Right handed     Past Surgical History:  Procedure Laterality Date  . WISDOM TOOTH EXTRACTION      Family History  Problem Relation Age of Onset  . Hypertension Mother   . Diabetes Father        type II  . Migraines Sister   . Diabetes Maternal Grandmother     Allergies  Allergen Reactions  . Shellfish Allergy Anaphylaxis  . Hydrocodone Itching    Pt states he feels itchy after taking, but it doesn't preclude him taking hydrocodone if needed    Current Medications:   Current Outpatient Medications:  .  clonazePAM (KLONOPIN) 0.5 MG disintegrating tablet, Take 1 tablet (0.5 mg total) by mouth 2 (two) times daily as needed., Disp: 30 tablet, Rfl: 0 .  EPINEPHrine (AUVI-Q) 0.3 mg/0.3 mL IJ SOAJ injection, Use as directed for severe allergic reaction, Disp: 2  Device, Rfl: 1 .  levocetirizine (XYZAL) 5 MG tablet, Take 5 mg by mouth every evening., Disp: , Rfl:  .  montelukast (SINGULAIR) 10 MG tablet, TAKE 1 TABLET BY MOUTH EVERYDAY AT BEDTIME, Disp: 90 tablet, Rfl: 1 .  propranolol (INDERAL) 40 MG tablet, Take 1 tablet (40 mg total) by mouth 2 (two) times daily., Disp: 180 tablet, Rfl: 1 .  rizatriptan (MAXALT) 10 MG tablet, Take 1 tablet (10 mg total) by mouth 3 (three) times daily as needed for migraine., Disp: 10 tablet, Rfl: 3 .  albuterol (PROVENTIL) (2.5 MG/3ML) 0.083% nebulizer solution, Take 3 mLs (2.5 mg total) by nebulization every 6 (six) hours as needed for wheezing or shortness of breath. (Patient not taking: Reported on 09/04/2018), Disp: 75 mL, Rfl: 1 .  amoxicillin (AMOXIL) 875 MG tablet, Take 1 tablet (875 mg total) by mouth 2 (two) times daily., Disp: 20 tablet, Rfl: 0 .  benzonatate (TESSALON) 100 MG capsule, Take 1 capsule (100 mg total) by mouth 3 (three) times daily as needed for cough., Disp: 20 capsule, Rfl: 1 .  PROAIR HFA 108 (90 Base) MCG/ACT inhaler, INHALE 2 PUFFS INTO THE LUNGS EVERY 4 (FOUR) HOURS AS NEEDED FOR WHEEZING OR SHORTNESS OF BREATH. (Patient not taking: Reported on 09/04/2018), Disp: 8.5 Inhaler, Rfl: 1   Review of Systems:   Review of Systems  Constitutional: Positive for chills.  HENT: Positive for congestion (Nasal drainage yellow/green), ear pain (Bilateral), hoarse voice, sinus pressure and sore throat.   Respiratory: Positive for cough (expectorating yellow sputum). Negative for shortness of breath.   Musculoskeletal: Negative for neck pain.  Neurological: Positive for headaches.    Vitals:   Vitals:   09/04/18 1246  BP: 110/70  Pulse: 90  Temp: 98.8 F (37.1 C)  TempSrc: Oral  SpO2: 95%  Weight: (!) 359 lb 6.1 oz (163 kg)  Height: 6\' 6"  (1.981 m)     Body mass index is 41.53 kg/m.  Physical Exam:   Physical Exam  Constitutional: He appears well-developed. He is cooperative.  Non-toxic  appearance. He does not have a sickly appearance. He does not appear ill. No distress.  HENT:  Head: Normocephalic and atraumatic.  Right Ear: Tympanic membrane, external ear and ear canal normal. Tympanic membrane is not erythematous, not retracted and not bulging.  Left Ear: Tympanic membrane, external ear and ear canal normal. Tympanic membrane is not erythematous, not retracted and not bulging.  Nose: Mucosal edema and rhinorrhea present. Right sinus exhibits maxillary sinus tenderness. Right sinus exhibits no frontal sinus tenderness. Left  sinus exhibits maxillary sinus tenderness. Left sinus exhibits no frontal sinus tenderness.  Mouth/Throat: Uvula is midline. No posterior oropharyngeal edema or posterior oropharyngeal erythema.  Eyes: Conjunctivae and lids are normal.  Neck: Trachea normal.  Cardiovascular: Normal rate, regular rhythm, S1 normal, S2 normal and normal heart sounds.  Pulmonary/Chest: Effort normal and breath sounds normal. He has no decreased breath sounds. He has no wheezes. He has no rhonchi. He has no rales.  Lungs CTA bilaterally  Lymphadenopathy:    He has no cervical adenopathy.  Neurological: He is alert.  Skin: Skin is warm, dry and intact.  Psychiatric: He has a normal mood and affect. His speech is normal and behavior is normal.  Nursing note and vitals reviewed.  Results for orders placed or performed in visit on 09/04/18  POCT Influenza A/B  Result Value Ref Range   Influenza A, POC Negative Negative   Influenza B, POC Negative Negative  POCT rapid strep A  Result Value Ref Range   Rapid Strep A Screen Positive (A) Negative   EKG tracing is personally reviewed.  EKG notes NSR.  No acute changes.    Assessment and Plan:   Deyvi was seen today for sinus problem.  Diagnoses and all orders for this visit:  Chest tightness EKG reassuring. Discussed monitoring symptoms. No need for acute intervention at this time. Hopefully getting his cough under  control will help. Low suspicion for cardiac etiology at this time. -     EKG 12-Lead  Strep pharyngitis No red flags on exam. Strep test positive.  Will initiate Amoxicillin per orders. I also gave him prescription for tessalon perles to help with cough. Discussed taking medications as prescribed. Reviewed return precautions including worsening fever, SOB, worsening cough or other concerns. Push fluids and rest. I recommend that patient follow-up if symptoms worsen or persist despite treatment x 7-10 days, sooner if needed. -     POCT Influenza A/B -     POCT rapid strep A  Other orders -     benzonatate (TESSALON) 100 MG capsule; Take 1 capsule (100 mg total) by mouth 3 (three) times daily as needed for cough. -     amoxicillin (AMOXIL) 875 MG tablet; Take 1 tablet (875 mg total) by mouth 2 (two) times daily.  . Reviewed expectations re: course of current medical issues. . Discussed self-management of symptoms. . Outlined signs and symptoms indicating need for more acute intervention. . Patient verbalized understanding and all questions were answered. . See orders for this visit as documented in the electronic medical record. . Patient received an After-Visit Summary.  CMA or LPN served as scribe during this visit. History, Physical, and Plan performed by medical provider. The above documentation has been reviewed and is accurate and complete.  Inda Coke, PA-C

## 2018-09-06 ENCOUNTER — Encounter: Payer: Self-pay | Admitting: Physician Assistant

## 2018-09-12 ENCOUNTER — Encounter: Payer: Self-pay | Admitting: Physician Assistant

## 2018-09-19 ENCOUNTER — Encounter: Payer: Self-pay | Admitting: Physician Assistant

## 2018-09-26 ENCOUNTER — Ambulatory Visit (INDEPENDENT_AMBULATORY_CARE_PROVIDER_SITE_OTHER): Payer: 59

## 2018-09-26 DIAGNOSIS — J309 Allergic rhinitis, unspecified: Secondary | ICD-10-CM | POA: Diagnosis not present

## 2018-10-01 ENCOUNTER — Encounter: Payer: Self-pay | Admitting: Physician Assistant

## 2018-10-01 ENCOUNTER — Ambulatory Visit: Payer: 59 | Admitting: Physician Assistant

## 2018-10-01 ENCOUNTER — Other Ambulatory Visit: Payer: Self-pay

## 2018-10-01 VITALS — BP 116/70 | HR 60 | Temp 98.6°F | Resp 16 | Ht 78.0 in | Wt 363.0 lb

## 2018-10-01 DIAGNOSIS — R109 Unspecified abdominal pain: Secondary | ICD-10-CM

## 2018-10-01 NOTE — Progress Notes (Signed)
Patient presents to clinic today c/o couple of weeks of intermittent abdominal pain, described as cramping and dull in nature. Notes is generalized but sometimes in the RLQ and R-side. Denies change to bladder habits. Notes constipation with harder stools despite good hydration and no change in diet or activity level. Denies fever, chills, nausea/vomiting. Took some Milk of Magnesia and had a BM this morning with some improvement in symptoms. No pain currently.   Past Medical History:  Diagnosis Date  . Anxiety   . Chronic headaches   . Clavicular fracture   . History of chickenpox   . Mild intermittent asthma 06/07/2018  . Shingles    23 years old.    Current Outpatient Medications on File Prior to Visit  Medication Sig Dispense Refill  . acetaminophen (TYLENOL) 325 MG tablet Take 650 mg by mouth every 6 (six) hours as needed.    Marland Kitchen levocetirizine (XYZAL) 5 MG tablet Take 5 mg by mouth every evening.    . montelukast (SINGULAIR) 10 MG tablet TAKE 1 TABLET BY MOUTH EVERYDAY AT BEDTIME 90 tablet 1  . propranolol (INDERAL) 40 MG tablet Take 1 tablet (40 mg total) by mouth 2 (two) times daily. 180 tablet 1  . clonazePAM (KLONOPIN) 0.5 MG disintegrating tablet Take 1 tablet (0.5 mg total) by mouth 2 (two) times daily as needed. (Patient not taking: Reported on 10/01/2018) 30 tablet 0  . EPINEPHrine (AUVI-Q) 0.3 mg/0.3 mL IJ SOAJ injection Use as directed for severe allergic reaction (Patient not taking: Reported on 10/01/2018) 2 Device 1  . rizatriptan (MAXALT) 10 MG tablet Take 1 tablet (10 mg total) by mouth 3 (three) times daily as needed for migraine. (Patient not taking: Reported on 10/01/2018) 10 tablet 3   No current facility-administered medications on file prior to visit.     Allergies  Allergen Reactions  . Shellfish Allergy Anaphylaxis  . Hydrocodone Itching    Pt states he feels itchy after taking, but it doesn't preclude him taking hydrocodone if needed    Family History    Problem Relation Age of Onset  . Hypertension Mother   . Diabetes Father        type II  . Migraines Sister   . Diabetes Maternal Grandmother     Social History   Socioeconomic History  . Marital status: Single    Spouse name: Not on file  . Number of children: 0  . Years of education: College  . Highest education level: Not on file  Occupational History  . Occupation: [employer]  . Financial resource strain: Not on file  . Food insecurity:    Worry: Not on file    Inability: Not on file  . Transportation needs:    Medical: Not on file    Non-medical: Not on file  Tobacco Use  . Smoking status: Never Smoker  . Smokeless tobacco: Never Used  Substance and Sexual Activity  . Alcohol use: Yes    Frequency: Never    Comment: Seldom  . Drug use: Not Currently    Types: Marijuana    Comment: rare use  . Sexual activity: Yes    Partners: Female  Lifestyle  . Physical activity:    Days per week: Not on file    Minutes per session: Not on file  . Stress: Not on file  Relationships  . Social connections:    Talks on phone: Not on file    Gets together: Not on file  Attends religious service: Not on file    Active member of club or organization: Not on file    Attends meetings of clubs or organizations: Not on file    Relationship status: Not on file  Other Topics Concern  . Not on file  Social History Narrative   Lives alone   Caffeine use: 1 cup coffee per day   Right handed    Review of Systems - See HPI.  All other ROS are negative.  There were no vitals taken for this visit.  Physical Exam Constitutional:      Appearance: He is well-developed. He is obese.  HENT:     Head: Normocephalic and atraumatic.  Cardiovascular:     Rate and Rhythm: Normal rate and regular rhythm.  Pulmonary:     Effort: Pulmonary effort is normal.     Breath sounds: Normal breath sounds.  Abdominal:     General: Bowel sounds are normal.     Tenderness:  There is generalized abdominal tenderness.     Hernia: No hernia is present.  Neurological:     Mental Status: He is alert.     Recent Results (from the past 2160 hour(s))  POCT Influenza A/B     Status: Normal   Collection Time: 09/04/18  1:20 PM  Result Value Ref Range   Influenza A, POC Negative Negative   Influenza B, POC Negative Negative  POCT rapid strep A     Status: Abnormal   Collection Time: 09/04/18  1:21 PM  Result Value Ref Range   Rapid Strep A Screen Positive (A) Negative    Assessment/Plan: 1. Intermittent abdominal pain Seems most consistent with constipation but will check labs today to assess sign of infection or underactive thyroid contributing to symptoms giving patient's concerns. DG abdomen to assess stool burden. Bowel regimen reviewed to help with constipation. Will alter regimen according to results and response to bowel regimen.  - CBC w/Diff - Urinalysis, Routine w reflex microscopic - DG Abd 2 Views; Future - TSH   Leeanne Rio, PA-C

## 2018-10-01 NOTE — Patient Instructions (Signed)
Keep hydrated and make sure to take a daily probiotic. Start the regimen below until told otherwise.  I encourage you to increase hydration and the amount of fiber in your diet.  Start a daily probiotic (Align, Culturelle, Digestive Advantage, etc.). If no bowel movement within 24 hours, take 2 Tbs of Milk of Magnesia in a 4 oz glass of warmed prune juice every 2-3 days to help promote bowel movement. If no results within 24 hours, then repeat above regimen, adding a Dulcolax stool softener to regimen. If this does not promote a bowel movement, please call the office.    Please go to the [facility] office for x-ray. We will call you with your results and alter treatment accordingly.   [place]  [place], [place] [address]

## 2018-10-02 ENCOUNTER — Ambulatory Visit (INDEPENDENT_AMBULATORY_CARE_PROVIDER_SITE_OTHER): Payer: 59

## 2018-10-02 ENCOUNTER — Encounter: Payer: Self-pay | Admitting: Physician Assistant

## 2018-10-02 ENCOUNTER — Ambulatory Visit: Payer: 59 | Admitting: Physician Assistant

## 2018-10-02 DIAGNOSIS — R109 Unspecified abdominal pain: Secondary | ICD-10-CM | POA: Diagnosis not present

## 2018-10-02 LAB — URINALYSIS, ROUTINE W REFLEX MICROSCOPIC
Bilirubin Urine: NEGATIVE
Hgb urine dipstick: NEGATIVE
KETONES UR: NEGATIVE
Leukocytes, UA: NEGATIVE
Nitrite: NEGATIVE
RBC / HPF: NONE SEEN (ref 0–?)
SPECIFIC GRAVITY, URINE: 1.015 (ref 1.000–1.030)
Total Protein, Urine: NEGATIVE
Urine Glucose: NEGATIVE
Urobilinogen, UA: 0.2 (ref 0.0–1.0)
WBC, UA: NONE SEEN (ref 0–?)
pH: 6 (ref 5.0–8.0)

## 2018-10-02 LAB — TSH: TSH: 2.45 u[IU]/mL (ref 0.35–4.50)

## 2018-10-02 LAB — CBC WITH DIFFERENTIAL/PLATELET
Basophils Absolute: 0 10*3/uL (ref 0.0–0.1)
Basophils Relative: 0.6 % (ref 0.0–3.0)
EOS PCT: 1.7 % (ref 0.0–5.0)
Eosinophils Absolute: 0.1 10*3/uL (ref 0.0–0.7)
HCT: 43.5 % (ref 39.0–52.0)
Hemoglobin: 14.6 g/dL (ref 13.0–17.0)
Lymphocytes Relative: 27.5 % (ref 12.0–46.0)
Lymphs Abs: 2 10*3/uL (ref 0.7–4.0)
MCHC: 33.6 g/dL (ref 30.0–36.0)
MCV: 90.8 fl (ref 78.0–100.0)
MONOS PCT: 6.5 % (ref 3.0–12.0)
Monocytes Absolute: 0.5 10*3/uL (ref 0.1–1.0)
Neutro Abs: 4.6 10*3/uL (ref 1.4–7.7)
Neutrophils Relative %: 63.7 % (ref 43.0–77.0)
Platelets: 194 10*3/uL (ref 150.0–400.0)
RBC: 4.8 Mil/uL (ref 4.22–5.81)
RDW: 13.1 % (ref 11.5–15.5)
WBC: 7.2 10*3/uL (ref 4.0–10.5)

## 2018-10-03 ENCOUNTER — Encounter: Payer: Self-pay | Admitting: Physician Assistant

## 2018-10-15 ENCOUNTER — Ambulatory Visit (INDEPENDENT_AMBULATORY_CARE_PROVIDER_SITE_OTHER): Payer: 59 | Admitting: *Deleted

## 2018-10-15 DIAGNOSIS — J309 Allergic rhinitis, unspecified: Secondary | ICD-10-CM | POA: Diagnosis not present

## 2018-10-29 ENCOUNTER — Ambulatory Visit (INDEPENDENT_AMBULATORY_CARE_PROVIDER_SITE_OTHER): Payer: 59

## 2018-10-29 DIAGNOSIS — J309 Allergic rhinitis, unspecified: Secondary | ICD-10-CM

## 2018-10-31 ENCOUNTER — Encounter: Payer: Self-pay | Admitting: Physician Assistant

## 2018-11-06 ENCOUNTER — Ambulatory Visit (INDEPENDENT_AMBULATORY_CARE_PROVIDER_SITE_OTHER): Payer: 59 | Admitting: *Deleted

## 2018-11-06 DIAGNOSIS — J309 Allergic rhinitis, unspecified: Secondary | ICD-10-CM

## 2018-11-13 ENCOUNTER — Encounter: Payer: Self-pay | Admitting: Physician Assistant

## 2018-11-15 ENCOUNTER — Ambulatory Visit: Payer: 59 | Admitting: Physician Assistant

## 2018-11-15 ENCOUNTER — Other Ambulatory Visit: Payer: Self-pay

## 2018-11-15 ENCOUNTER — Ambulatory Visit (HOSPITAL_BASED_OUTPATIENT_CLINIC_OR_DEPARTMENT_OTHER)
Admission: RE | Admit: 2018-11-15 | Discharge: 2018-11-15 | Disposition: A | Discharge status: Home / Self Care | Payer: 59 | Source: Ambulatory Visit | Attending: Physician Assistant | Admitting: Physician Assistant

## 2018-11-15 ENCOUNTER — Encounter: Payer: Self-pay | Admitting: Physician Assistant

## 2018-11-15 VITALS — BP 118/70 | HR 67 | Temp 97.9°F | Resp 16 | Ht 78.0 in | Wt 364.0 lb

## 2018-11-15 DIAGNOSIS — R1011 Right upper quadrant pain: Secondary | ICD-10-CM | POA: Diagnosis not present

## 2018-11-15 DIAGNOSIS — R194 Change in bowel habit: Secondary | ICD-10-CM

## 2018-11-15 LAB — CBC WITH DIFFERENTIAL/PLATELET
BASOS PCT: 0.7 % (ref 0.0–3.0)
Basophils Absolute: 0 10*3/uL (ref 0.0–0.1)
EOS PCT: 1.1 % (ref 0.0–5.0)
Eosinophils Absolute: 0.1 10*3/uL (ref 0.0–0.7)
HCT: 44.9 % (ref 39.0–52.0)
Hemoglobin: 15.1 g/dL (ref 13.0–17.0)
Lymphocytes Relative: 30.2 % (ref 12.0–46.0)
Lymphs Abs: 1.7 10*3/uL (ref 0.7–4.0)
MCHC: 33.7 g/dL (ref 30.0–36.0)
MCV: 89 fl (ref 78.0–100.0)
Monocytes Absolute: 0.3 10*3/uL (ref 0.1–1.0)
Monocytes Relative: 5.6 % (ref 3.0–12.0)
Neutro Abs: 3.6 10*3/uL (ref 1.4–7.7)
Neutrophils Relative %: 62.4 % (ref 43.0–77.0)
Platelets: 199 10*3/uL (ref 150.0–400.0)
RBC: 5.05 Mil/uL (ref 4.22–5.81)
RDW: 12.8 % (ref 11.5–15.5)
WBC: 5.7 10*3/uL (ref 4.0–10.5)

## 2018-11-15 LAB — COMPREHENSIVE METABOLIC PANEL
ALT: 38 U/L (ref 0–53)
AST: 19 U/L (ref 0–37)
Albumin: 4.6 g/dL (ref 3.5–5.2)
Alkaline Phosphatase: 64 U/L (ref 39–117)
BUN: 14 mg/dL (ref 6–23)
CO2: 24 mEq/L (ref 19–32)
Calcium: 9.2 mg/dL (ref 8.4–10.5)
Chloride: 106 mEq/L (ref 96–112)
Creatinine, Ser: 0.75 mg/dL (ref 0.40–1.50)
GFR: 128.62 mL/min (ref >60.00)
Glucose, Bld: 100 mg/dL — ABNORMAL HIGH (ref 70–99)
POTASSIUM: 4 meq/L (ref 3.5–5.1)
Sodium: 139 mEq/L (ref 135–145)
Total Bilirubin: 0.6 mg/dL (ref 0.2–1.2)
Total Protein: 6.7 g/dL (ref 6.0–8.3)

## 2018-11-15 NOTE — Patient Instructions (Signed)
Please go to the lab today for blood work.  I will call you with your results.  Stop by the front desk to speak with Levada Dy regarding [place].   We will alter treatment regimen(s) if indicated by your results.   Please keep hydrated. Stop the Culturelle. Start the IB [place] probiotic.

## 2018-11-15 NOTE — Progress Notes (Signed)
Patient presents to clinic today c/o several days of alternating constipation and diarrhea, mostly loose stools over the past few days. Notes mild R mid and upper quadrant discomfort. Cannot say is affected by eating. Denies nausea, heartburn, indigestion. Sometimes note urgency if eating a fatty meals. Has been taking Culturelle daily.   Past Medical History:  Diagnosis Date  . Anxiety   . Chronic headaches   . Clavicular fracture   . History of chickenpox   . Mild intermittent asthma 06/07/2018  . Shingles    23 years old.    Current Outpatient Medications on File Prior to Visit  Medication Sig Dispense Refill  . levocetirizine (XYZAL) 5 MG tablet Take 5 mg by mouth every evening.    . montelukast (SINGULAIR) 10 MG tablet TAKE 1 TABLET BY MOUTH EVERYDAY AT BEDTIME 90 tablet 1  . propranolol (INDERAL) 40 MG tablet Take 1 tablet (40 mg total) by mouth 2 (two) times daily. 180 tablet 1  . acetaminophen (TYLENOL) 325 MG tablet Take 650 mg by mouth every 6 (six) hours as needed.    . clonazePAM (KLONOPIN) 0.5 MG disintegrating tablet Take 1 tablet (0.5 mg total) by mouth 2 (two) times daily as needed. (Patient not taking: Reported on 10/01/2018) 30 tablet 0  . EPINEPHrine (AUVI-Q) 0.3 mg/0.3 mL IJ SOAJ injection Use as directed for severe allergic reaction (Patient not taking: Reported on 10/01/2018) 2 Device 1  . rizatriptan (MAXALT) 10 MG tablet Take 1 tablet (10 mg total) by mouth 3 (three) times daily as needed for migraine. (Patient not taking: Reported on 10/01/2018) 10 tablet 3   No current facility-administered medications on file prior to visit.     Allergies  Allergen Reactions  . Shellfish Allergy Anaphylaxis  . Hydrocodone Itching    Pt states he feels itchy after taking, but it doesn't preclude him taking hydrocodone if needed    Family History  Problem Relation Age of Onset  . Hypertension Mother   . Diabetes Father        type II  . Migraines Sister   . Diabetes  Maternal Grandmother     Social History   Socioeconomic History  . Marital status: Single    Spouse name: Not on file  . Number of children: 0  . Years of education: College  . Highest education level: Not on file  Occupational History  . Occupation: [employer]  . Financial resource strain: Not on file  . Food insecurity:    Worry: Not on file    Inability: Not on file  . Transportation needs:    Medical: Not on file    Non-medical: Not on file  Tobacco Use  . Smoking status: Never Smoker  . Smokeless tobacco: Never Used  Substance and Sexual Activity  . Alcohol use: Yes    Frequency: Never    Comment: Seldom  . Drug use: Not Currently    Types: Marijuana    Comment: rare use  . Sexual activity: Yes    Partners: Female  Lifestyle  . Physical activity:    Days per week: Not on file    Minutes per session: Not on file  . Stress: Not on file  Relationships  . Social connections:    Talks on phone: Not on file    Gets together: Not on file    Attends religious service: Not on file    Active member of club or organization: Not on file  Attends meetings of clubs or organizations: Not on file    Relationship status: Not on file  Other Topics Concern  . Not on file  Social History Narrative   Lives alone   Caffeine use: 1 cup coffee per day   Right handed    Review of Systems - See HPI.  All other ROS are negative.  BP 118/70   Pulse 67   Temp 97.9 F (36.6 C) (Oral)   Resp 16   Ht 6\' 6"  (1.981 m)   Wt (!) 364 lb (165.1 kg)   SpO2 99%   BMI 42.06 kg/m   Physical Exam Vitals signs reviewed.  Constitutional:      Appearance: Normal appearance.  HENT:     Head: Normocephalic and atraumatic.  Neck:     Musculoskeletal: Neck supple.  Cardiovascular:     Rate and Rhythm: Normal rate and regular rhythm.     Heart sounds: Normal heart sounds.  Pulmonary:     Effort: Pulmonary effort is normal.     Breath sounds: Normal breath sounds.    Abdominal:     General: There is no distension.     Palpations: Abdomen is soft. There is no mass.     Tenderness: There is abdominal tenderness in the right upper quadrant. There is no right CVA tenderness, left CVA tenderness, guarding or rebound.     Hernia: No hernia is present.  Neurological:     Mental Status: He is alert.    Recent Results (from the past 2160 hour(s))  POCT Influenza A/B     Status: Normal   Collection Time: 09/04/18  1:20 PM  Result Value Ref Range   Influenza A, POC Negative Negative   Influenza B, POC Negative Negative  POCT rapid strep A     Status: Abnormal   Collection Time: 09/04/18  1:21 PM  Result Value Ref Range   Rapid Strep A Screen Positive (A) Negative  CBC w/Diff     Status: None   Collection Time: 10/01/18  4:11 PM  Result Value Ref Range   WBC 7.2 4.0 - 10.5 K/uL   RBC 4.80 4.22 - 5.81 Mil/uL   Hemoglobin 14.6 13.0 - 17.0 g/dL   HCT 43.5 39.0 - 52.0 %   MCV 90.8 78.0 - 100.0 fl   MCHC 33.6 30.0 - 36.0 g/dL   RDW 13.1 11.5 - 15.5 %   Platelets 194.0 150.0 - 400.0 K/uL   Neutrophils Relative % 63.7 43.0 - 77.0 %   Lymphocytes Relative 27.5 12.0 - 46.0 %   Monocytes Relative 6.5 3.0 - 12.0 %   Eosinophils Relative 1.7 0.0 - 5.0 %   Basophils Relative 0.6 0.0 - 3.0 %   Neutro Abs 4.6 1.4 - 7.7 K/uL   Lymphs Abs 2.0 0.7 - 4.0 K/uL   Monocytes Absolute 0.5 0.1 - 1.0 K/uL   Eosinophils Absolute 0.1 0.0 - 0.7 K/uL   Basophils Absolute 0.0 0.0 - 0.1 K/uL  Urinalysis, Routine w reflex microscopic     Status: None   Collection Time: 10/01/18  4:11 PM  Result Value Ref Range   Color, Urine YELLOW Yellow;Lt. Yellow   APPearance CLEAR Clear   Specific Gravity, Urine 1.015 1.000 - 1.030   pH 6.0 5.0 - 8.0   Total Protein, Urine NEGATIVE Negative   Urine Glucose NEGATIVE Negative   Ketones, ur NEGATIVE Negative   Bilirubin Urine NEGATIVE Negative   Hgb urine dipstick NEGATIVE Negative   Urobilinogen,  UA 0.2 0.0 - 1.0   Leukocytes, UA  NEGATIVE Negative   Nitrite NEGATIVE Negative   WBC, UA none seen 0-2/hpf   RBC / HPF none seen 0-2/hpf  TSH     Status: None   Collection Time: 10/01/18  4:11 PM  Result Value Ref Range   TSH 2.45 0.35 - 4.50 uIU/mL   Assessment/Plan: 1. RUQ pain 2. Change in stool habits RUQ tenderness with absent murphy sign. Will check CBC, CMP and [place] today. Start IB [place]. Dietary recommendations reviewed. If workup negative, will refer to GI as this really has been going on for a while, just recently worsened.   - CBC with Differential/Platelet - Comprehensive metabolic panel - US Abdomen Limited RUQ; Future   Leeanne Rio, PA-C

## 2018-11-18 ENCOUNTER — Encounter: Payer: Self-pay | Admitting: Physician Assistant

## 2018-11-18 ENCOUNTER — Ambulatory Visit (INDEPENDENT_AMBULATORY_CARE_PROVIDER_SITE_OTHER): Payer: 59 | Admitting: *Deleted

## 2018-11-18 DIAGNOSIS — R195 Other fecal abnormalities: Secondary | ICD-10-CM

## 2018-11-18 DIAGNOSIS — J309 Allergic rhinitis, unspecified: Secondary | ICD-10-CM | POA: Diagnosis not present

## 2018-11-20 MED ORDER — ONDANSETRON HCL 8 MG PO TABS: 8.0000 mg | ORAL_TABLET | Freq: Three times a day (TID) | ORAL | 0 refills | Status: DC | PRN

## 2018-11-20 NOTE — Addendum Note (Signed)
Addended by: Brunetta Jeans on: 11/20/2018 06:46 AM   Modules accepted: Orders

## 2018-11-22 ENCOUNTER — Encounter: Payer: Self-pay | Admitting: Gastroenterology

## 2018-11-22 ENCOUNTER — Encounter: Payer: Self-pay | Admitting: Physician Assistant

## 2018-12-02 ENCOUNTER — Ambulatory Visit (INDEPENDENT_AMBULATORY_CARE_PROVIDER_SITE_OTHER): Payer: 59

## 2018-12-02 DIAGNOSIS — J309 Allergic rhinitis, unspecified: Secondary | ICD-10-CM

## 2018-12-09 ENCOUNTER — Encounter: Payer: Self-pay | Admitting: Gastroenterology

## 2018-12-09 ENCOUNTER — Ambulatory Visit: Payer: 59 | Admitting: Gastroenterology

## 2018-12-09 ENCOUNTER — Other Ambulatory Visit: Payer: Self-pay

## 2018-12-09 VITALS — BP 118/74 | HR 73 | Temp 98.4°F | Ht 78.0 in | Wt 370.0 lb

## 2018-12-09 DIAGNOSIS — R195 Other fecal abnormalities: Secondary | ICD-10-CM

## 2018-12-09 DIAGNOSIS — R1084 Generalized abdominal pain: Secondary | ICD-10-CM

## 2018-12-09 NOTE — Progress Notes (Signed)
Chief Complaint: Abdominal pain, change in bowel habits  Referring Provider:     Raiford Noble PA-C   HPI:    Bradley [name] is a 23 y.o. male referred to the [facility] for evaluation of abdominal pain, change in stools. Sxs have been ongoing "my entire life" and has flare up over the last year or so. Alternating diarrhea and constipation, each lasting a few days, and will also have days of normal stools (1 formed stool). Days with diarrhea described as 3 loose non-bloody stools/day. Generalized abdominal pain. Pain unchanged with PO intake, BM. Bowel habit sxs have improved since starting Probiotic, but not much improvement in pain. Has not trialed any other medications for sxs. Does maintain regular dietary fiber intake- no fiber supplement. No hematochezia or melena. Pain will last a few days, but not limiting activities. Rare nausea and no emesis. No wt loss, night sweats, f/c. No prior EGD or colonoscopy. No known family history of CRC, GI malignancy, liver disease, pancreatic disease, or IBD.   Evaluation to date n/f: Normal CBC, CMP, TSH, negative C diff, stool culture. RUQ [place] last month Normal. Abd [place] in 11/2017 normal as well.    Past Medical History:  Diagnosis Date  . Anxiety   . Chronic headaches   . Clavicular fracture   . History of chickenpox   . Mild intermittent asthma 06/07/2018  . Shingles    23 years old.     Past Surgical History:  Procedure Laterality Date  . WISDOM TOOTH EXTRACTION     Family History  Problem Relation Age of Onset  . Hypertension Mother   . Colon polyps Mother        might be mistakrn  . Diabetes Father        type II  . Migraines Sister   . Colon cancer Neg Hx   . Esophageal cancer Neg Hx    Social History   Tobacco Use  . Smoking status: Never Smoker  . Smokeless tobacco: Never Used  Substance Use Topics  . Alcohol use: Yes    Frequency: Never    Comment: Seldom  . Drug use: Not Currently   Types: Marijuana    Comment: rare use   Current Outpatient Medications  Medication Sig Dispense Refill  . levocetirizine (XYZAL) 5 MG tablet Take 5 mg by mouth every evening.    . montelukast (SINGULAIR) 10 MG tablet TAKE 1 TABLET BY MOUTH EVERYDAY AT BEDTIME 90 tablet 1  . Probiotic Product (PROBIOTIC DAILY PO) Take 1 tablet by mouth daily. [facility]    . propranolol (INDERAL) 40 MG tablet Take 1 tablet (40 mg total) by mouth 2 (two) times daily. 180 tablet 1  . EPINEPHrine (AUVI-Q) 0.3 mg/0.3 mL IJ SOAJ injection Use as directed for severe allergic reaction (Patient not taking: Reported on 10/01/2018) 2 Device 1  . rizatriptan (MAXALT) 10 MG tablet Take 1 tablet (10 mg total) by mouth 3 (three) times daily as needed for migraine. (Patient not taking: Reported on 10/01/2018) 10 tablet 3   No current facility-administered medications for this visit.    Allergies  Allergen Reactions  . Shellfish Allergy Anaphylaxis  . Hydrocodone Itching    Pt states he feels itchy after taking, but it doesn't preclude him taking hydrocodone if needed     Review of Systems: All systems reviewed and negative except where noted in HPI.  Physical Exam:    Wt Readings from Last 3 Encounters:  12/09/18 (!) 370 lb (167.8 kg)  11/15/18 (!) 364 lb (165.1 kg)  10/01/18 (!) 363 lb (164.7 kg)    BP 118/74   Pulse 73   Temp 98.4 F (36.9 C)   Ht 6\' 6"  (1.981 m)   Wt (!) 370 lb (167.8 kg)   BMI 42.76 kg/m  Constitutional:  Pleasant, in no acute distress. Psychiatric: Normal mood and affect. Behavior is normal. EENT: Pupils normal.  Conjunctivae are normal. No scleral icterus. Neck supple. No cervical LAD. Cardiovascular: Normal rate, regular rhythm. No edema Pulmonary/chest: Effort normal and breath sounds normal. No wheezing, rales or rhonchi. Abdominal: Soft, nondistended, nontender. Bowel sounds active throughout. There are no masses palpable. No hepatomegaly. Neurological: Alert and  oriented to person place and time. Skin: Skin is warm and dry. No rashes noted.   ASSESSMENT AND PLAN;   Bradley [name] is a 23 y.o. 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  Did discuss full DDX for alternating bowel habits and abdominal pain, and will plan on further evaluation as below:  - Low FODMAP Diet - Increase dietary fiber.  Provided with fiber chart - Start fiber supplement - Resume Probiotic given good clinical benefit - He would like to hold off on Bentyl, Symax, etc - Wants to hold off on EGD/Colonoscopy at this time. If sxs persist despite above therapy, to call and will schedule  - RTC prn  I spent a total of 30 minutes of face-to-face time with the patient. Greater than 50% of the time was spent counseling and coordinating care.     Lavena Bullion, DO, FACG  12/09/2018, 10:30 AM   Brunetta Jeans, PA-C

## 2018-12-09 NOTE — Patient Instructions (Signed)
If you are age 24 or older, your body mass index should be between 23-30. Your Body mass index is 42.76 kg/m. If this is out of the aforementioned range listed, please consider follow up with your Primary Care Provider.  If you are age 19 or younger, your body mass index should be between 19-25. Your Body mass index is 42.76 kg/m. If this is out of the aformentioned range listed, please consider follow up with your Primary Care Provider.   Please start taking a daily fiber supplement such as citrucel, benefiber, metamucil, or fiber choice.   Begin a high fiber diet.  Fiber Chart  You should be consuming 25-30g of fiber per day and drinking 8 glasses of water to help your bowels move regularly.  In the chart below you can look up how much fiber you are getting in an average day.  If you are not getting enough fiber, you should add a fiber supplement to your diet.  Examples of this include Metamucil, FiberCon and Citrucel.  These can be purchased at your local grocery store or pharmacy.      [email or web address]-[email or web address]   It was a pleasure to see you today!  Vito Cirigliano, D.O.

## 2018-12-17 ENCOUNTER — Ambulatory Visit (INDEPENDENT_AMBULATORY_CARE_PROVIDER_SITE_OTHER): Payer: 59 | Admitting: *Deleted

## 2018-12-17 ENCOUNTER — Encounter: Payer: Self-pay | Admitting: Physician Assistant

## 2018-12-17 DIAGNOSIS — J309 Allergic rhinitis, unspecified: Secondary | ICD-10-CM | POA: Diagnosis not present

## 2018-12-31 ENCOUNTER — Ambulatory Visit (INDEPENDENT_AMBULATORY_CARE_PROVIDER_SITE_OTHER): Payer: 59

## 2018-12-31 ENCOUNTER — Encounter: Payer: Self-pay | Admitting: Physician Assistant

## 2018-12-31 DIAGNOSIS — J309 Allergic rhinitis, unspecified: Secondary | ICD-10-CM | POA: Diagnosis not present

## 2019-01-09 ENCOUNTER — Ambulatory Visit (INDEPENDENT_AMBULATORY_CARE_PROVIDER_SITE_OTHER): Payer: 59 | Admitting: *Deleted

## 2019-01-09 DIAGNOSIS — J309 Allergic rhinitis, unspecified: Secondary | ICD-10-CM | POA: Diagnosis not present

## 2019-01-12 ENCOUNTER — Ambulatory Visit (INDEPENDENT_AMBULATORY_CARE_PROVIDER_SITE_OTHER): Payer: 59

## 2019-01-12 ENCOUNTER — Other Ambulatory Visit: Payer: Self-pay

## 2019-01-12 ENCOUNTER — Encounter (HOSPITAL_COMMUNITY): Payer: Self-pay | Admitting: Emergency Medicine

## 2019-01-12 ENCOUNTER — Ambulatory Visit (HOSPITAL_COMMUNITY)
Admission: EM | Admit: 2019-01-12 | Discharge: 2019-01-12 | Disposition: A | Discharge status: Home / Self Care | Payer: 59 | Attending: Family Medicine | Admitting: Family Medicine

## 2019-01-12 DIAGNOSIS — M25561 Pain in right knee: Secondary | ICD-10-CM

## 2019-01-12 MED ORDER — IBUPROFEN 800 MG PO TABS: 800.0000 mg | ORAL_TABLET | Freq: Three times a day (TID) | ORAL | 0 refills | Status: DC
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
## 2019-01-12 NOTE — Discharge Instructions (Signed)
No fracture Use anti-inflammatories for pain/swelling. You may take up to 800 mg Ibuprofen every 8 hours with food. You may supplement Ibuprofen with Tylenol [phone] mg every 8 hours.  Ice and elevate Follow up if symptoms not improving in 1-2 weeks

## 2019-01-13 ENCOUNTER — Ambulatory Visit: Payer: Self-pay | Admitting: *Deleted

## 2019-01-13 ENCOUNTER — Ambulatory Visit: Payer: 59 | Admitting: Sports Medicine

## 2019-01-13 ENCOUNTER — Other Ambulatory Visit: Payer: Self-pay

## 2019-01-13 VITALS — BP 114/80 | HR 73 | Temp 98.2°F | Ht 78.0 in | Wt 373.2 lb

## 2019-01-13 DIAGNOSIS — S8001XA Contusion of right knee, initial encounter: Secondary | ICD-10-CM | POA: Diagnosis not present

## 2019-01-13 NOTE — Telephone Encounter (Signed)
See note, please schedule appt
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
## 2019-01-16 ENCOUNTER — Encounter: Payer: Self-pay | Admitting: Sports Medicine
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
## 2019-01-20 ENCOUNTER — Encounter: Payer: Self-pay | Admitting: Sports Medicine

## 2019-01-22 ENCOUNTER — Ambulatory Visit: Payer: Self-pay | Admitting: *Deleted

## 2019-01-22 ENCOUNTER — Encounter: Payer: Self-pay | Admitting: Physician Assistant

## 2019-01-22 ENCOUNTER — Telehealth: Payer: Self-pay

## 2019-01-22 NOTE — Telephone Encounter (Signed)
Spoke to pt, scheduled him for tomorrow with Dr. Tamala Julian
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
## 2019-01-22 NOTE — Telephone Encounter (Signed)
Right knee injury and was seen by Dr. Paulla Fore last week. Reporting the knee had slight redness (pink) and mild warmth to it last night but less today. No increase in pain, no SOB/CP/Fever/calf pain/swelling. Stated he did not implement RICE. Advised to practice RICE at this time and if no improvement to call back. Reviewed symptoms requiring immediate evaluation including SOB/CP/calf pain/swelling/increased redness/pain. Stated he understood. Forwarding to Dr. Paulla Fore for any additional advice if needed.   Reason for Disposition . Knee pain . Minor injury or pain from direct blow  Answer Assessment - Initial Assessment Questions 1. LOCATION and RADIATION: "Where is the pain located?"      Right knee cap 2. QUALITY: "What does the pain feel like?"  (e.g., sharp, dull, aching, burning)    aching 3. SEVERITY: "How bad is the pain?" "What does it keep you from doing?"   (Scale 1-10; or mild, moderate, severe)   -  MILD (1-3): doesn't interfere with normal activities    -  MODERATE (4-7): interferes with normal activities (e.g., work or school) or awakens from sleep, limping    -  SEVERE (8-10): excruciating pain, unable to do any normal activities, unable to walk     3 4. ONSET: "When did the pain start?" "Does it come and go, or is it there all the time?"    Last night he noticed more warmth and redness 5. RECURRENT: "Have you had this pain before?" If so, ask: "When, and what happened then?"     Yes, recent accident 6. SETTING: "Has there been any recent work, exercise or other activity that involved that part of the body?"      accident 7. AGGRAVATING FACTORS: "What makes the knee pain worse?" (e.g., walking, climbing stairs, running)     Walking and bending the knee the pain is noticed slightly more 8. ASSOCIATED SYMPTOMS: "Is there any swelling or redness of the knee?"     Yes both 9. OTHER SYMPTOMS: "Do you have any other symptoms?" (e.g., chest pain, difficulty breathing, fever, calf pain)     No to all 10. PREGNANCY: "Is there any chance you are pregnant?" "When was your last menstrual period?"       na  Protocols used: KNEE PAIN-A-AH, KNEE INJURY-A-AH

## 2019-01-22 NOTE — Telephone Encounter (Signed)
Dr. Paulla Fore is no longer with [facility]. We were advised that Dr. Charlann Boxer was taking on his patients. Please advise.

## 2019-01-23 ENCOUNTER — Encounter: Payer: Self-pay | Admitting: Family Medicine

## 2019-01-23 ENCOUNTER — Ambulatory Visit: Payer: 59 | Admitting: Family Medicine

## 2019-01-23 ENCOUNTER — Other Ambulatory Visit: Payer: Self-pay

## 2019-01-23 ENCOUNTER — Ambulatory Visit: Payer: Self-pay

## 2019-01-23 VITALS — BP 120/72 | HR 69 | Ht 78.0 in | Wt 369.0 lb

## 2019-01-23 DIAGNOSIS — S76301A Unspecified injury of muscle, fascia and tendon of the posterior muscle group at thigh level, right thigh, initial encounter: Secondary | ICD-10-CM | POA: Diagnosis not present

## 2019-01-23 DIAGNOSIS — M25561 Pain in right knee: Secondary | ICD-10-CM | POA: Diagnosis not present

## 2019-01-23 NOTE — Patient Instructions (Signed)
Good to see you.  Ice 20 minutes 2 times daily. Usually after activity and before bed. Exercises 3 times a week.  Knee compression or thigh compression  Stay active and try to walk normal.  pennsaid pinkie amount topically 2 times daily as needed.  Arnica lotion over the ocounter can help with bruising.  We will set you up in 7-10 days for virtual Be safe

## 2019-01-23 NOTE — Assessment & Plan Note (Signed)
Believe patient has more of a hamstring strain from the hyperextension of the knee.  I do believe that patient may have even had a patella subluxation that initially was giving patient's pain.  Patient's ultrasound was fairly unremarkable.  We discussed with patient's age, no significant chronic problems and has been ambulating that the likelihood of a deep venous embolus is highly low.  Patient was given the opportunity to have a Doppler ultrasound which she declined once we discussed that this is a very low likelihood.  Hamstring exercises given, home exercises for strengthening of the knee also noted.  Felt more compression would be beneficial and topical anti-inflammatories given.  Follow-up again in 2 to 3 weeks
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
## 2019-01-24 ENCOUNTER — Telehealth: Payer: Self-pay | Admitting: Family Medicine

## 2019-01-24 HISTORY — PX: CYSTECTOMY: SUR359

## 2019-01-24 NOTE — Telephone Encounter (Signed)
Spoke with patient about his ongoing symptoms. Advised to monitor for now and can follow up with Dr. Tamala Julian as planned.   Rosemarie Ax, MD [facility] 01/24/2019, 4:39 PM

## 2019-01-24 NOTE — Telephone Encounter (Signed)
Pt called stating he is experiencing a tingling in his foot and wondered if that was normal. Please advise.

## 2019-01-29 ENCOUNTER — Ambulatory Visit (INDEPENDENT_AMBULATORY_CARE_PROVIDER_SITE_OTHER): Payer: 59 | Admitting: *Deleted

## 2019-01-29 DIAGNOSIS — J309 Allergic rhinitis, unspecified: Secondary | ICD-10-CM

## 2019-01-30 ENCOUNTER — Encounter: Payer: Self-pay | Admitting: Family Medicine

## 2019-01-30 ENCOUNTER — Ambulatory Visit (INDEPENDENT_AMBULATORY_CARE_PROVIDER_SITE_OTHER): Payer: 59 | Admitting: Family Medicine

## 2019-01-30 DIAGNOSIS — S76301A Unspecified injury of muscle, fascia and tendon of the posterior muscle group at thigh level, right thigh, initial encounter: Secondary | ICD-10-CM | POA: Diagnosis not present

## 2019-01-30 NOTE — Progress Notes (Signed)
[facility] [address] [place], [place] [address] Phone: [phone] Subjective:    Virtual Visit via Video Note  I connected with Bradley [name] on 01/30/19 at 12:45 PM EDT by a video enabled telemedicine application and verified that I am speaking with the correct person using two identifiers.  Location: Patient: Patient lin h[ome setting Provider I was in office setting   I discussed the limitations of evaluation and management by telemedicine and the availability of in person appointments. The patient expressed understanding and agreed to proceed.    I discussed the assessment and treatment plan with the patient. The patient was provided an opportunity to ask questions and all were answered. The patient agreed with the plan and demonstrated an understanding of the instructions.   The patient was advised to call back or seek an in-person evaluation if the symptoms worsen or if the condition fails to improve as anticipated.  I provided 16 minutes of face-to-face time during this encounter.   Lyndal Pulley, DO    CC: Knee pain follow-up  [id number]  Bradley [name] is a 23 y.o. male coming in with complaint of knee pain.  Seem to be more of a distal hamstring injury.  Given home exercise, topical anti-inflammatories, compression.  Patient states Feeling significantly better at this time.  Not having as severe pain.  Patient states some tingling in his foot but seems to go away after moving it.  Denies any swelling, states that the knee is almost pain-free    Past Medical History:  Diagnosis Date  . Anxiety   . Chronic headaches   . Clavicular fracture   . History of chickenpox   . Mild intermittent asthma 06/07/2018  . Shingles    23 years old.   Past Surgical History:  Procedure Laterality Date  . WISDOM TOOTH EXTRACTION     Social History   Socioeconomic History  . Marital status: Single    Spouse name: Not on file  . Number  of children: 0  . Years of education: College  . Highest education level: Not on file  Occupational History  . Occupation: [employer]  . Financial resource strain: Not on file  . Food insecurity:    Worry: Not on file    Inability: Not on file  . Transportation needs:    Medical: Not on file    Non-medical: Not on file  Tobacco Use  . Smoking status: Never Smoker  . Smokeless tobacco: Never Used  Substance and Sexual Activity  . Alcohol use: Yes    Frequency: Never    Comment: Seldom  . Drug use: Not Currently    Types: Marijuana    Comment: rare use  . Sexual activity: Yes    Partners: Female  Lifestyle  . Physical activity:    Days per week: Not on file    Minutes per session: Not on file  . Stress: Not on file  Relationships  . Social connections:    Talks on phone: Not on file    Gets together: Not on file    Attends religious service: Not on file    Active member of club or organization: Not on file    Attends meetings of clubs or organizations: Not on file    Relationship status: Not on file  Other Topics Concern  . Not on file  Social History Narrative   Lives alone   Caffeine use: 1 cup  coffee per day   Right handed    Allergies  Allergen Reactions  . Shellfish Allergy Anaphylaxis  . Hydrocodone Itching    Pt states he feels itchy after taking, but it doesn't preclude him taking hydrocodone if needed   Family History  Problem Relation Age of Onset  . Hypertension Mother   . Colon polyps Mother        might be mistakrn  . Diabetes Father        type II  . Migraines Sister   . Colon cancer Neg Hx   . Esophageal cancer Neg Hx      Current Outpatient Medications (Cardiovascular):  Marland Kitchen  EPINEPHrine (AUVI-Q) 0.3 mg/0.3 mL IJ SOAJ injection, Use as directed for severe allergic reaction .  propranolol (INDERAL) 40 MG tablet, Take 1 tablet (40 mg total) by mouth 2 (two) times daily.  Current Outpatient Medications (Respiratory):  .   levocetirizine (XYZAL) 5 MG tablet, Take 5 mg by mouth every evening.  Current Outpatient Medications (Analgesics):  .  ibuprofen (ADVIL) 800 MG tablet, Take 1 tablet (800 mg total) by mouth 3 (three) times daily.      Past medical history, social, surgical and family history all reviewed in electronic medical record.  No pertanent information unless stated regarding to the chief complaint.   Review of Systems:  No headache, visual changes, nausea, vomiting, diarrhea, constipation, dizziness, abdominal pain, skin rash, fevers, chills, night sweats, weight loss, swollen lymph nodes, body aches, joint swelling,  chest pain, shortness of breath, mood changes.  Positive muscle aches  Objective     General: No apparent distress alert and oriented x3 mood and affect normal, dressed appropriately.  HEENT: Pupils equal, extraocular movements intact  .     Impression and Recommendations:    . The above documentation has been reviewed and is accurate and complete Lyndal Pulley, DO       Note: This dictation was prepared with Dragon dictation along with smaller phrase technology. Any transcriptional errors that result from this process are unintentional.

## 2019-01-30 NOTE — Assessment & Plan Note (Signed)
Patient is doing significantly better at this time.  Patient had some tingling in his foot and we discussed that this is likely more compression from some residual swelling.  We discussed the peroneal nerve and watching for any type of weakness.  As long as patient does well he can follow-up as needed.  Patient is in agreement with the plan.

## 2019-02-03 ENCOUNTER — Other Ambulatory Visit: Payer: Self-pay | Admitting: Neurology

## 2019-02-03 MED ORDER — PROPRANOLOL HCL 20 MG PO TABS: 20.0000 mg | ORAL_TABLET | Freq: Two times a day (BID) | ORAL | 3 refills | Status: DC

## 2019-02-04 ENCOUNTER — Other Ambulatory Visit: Payer: Self-pay | Admitting: Neurology

## 2019-02-07 ENCOUNTER — Encounter: Payer: Self-pay | Admitting: Allergy

## 2019-02-07 ENCOUNTER — Other Ambulatory Visit: Payer: Self-pay

## 2019-02-07 ENCOUNTER — Ambulatory Visit: Payer: 59 | Admitting: Allergy

## 2019-02-07 ENCOUNTER — Ambulatory Visit: Payer: Self-pay | Admitting: *Deleted

## 2019-02-07 VITALS — BP 126/90 | HR 70 | Resp 18 | Ht 78.0 in | Wt 368.0 lb

## 2019-02-07 DIAGNOSIS — T7800XD Anaphylactic reaction due to unspecified food, subsequent encounter: Secondary | ICD-10-CM | POA: Diagnosis not present

## 2019-02-07 DIAGNOSIS — J309 Allergic rhinitis, unspecified: Secondary | ICD-10-CM

## 2019-02-07 DIAGNOSIS — T7800XA Anaphylactic reaction due to unspecified food, initial encounter: Secondary | ICD-10-CM | POA: Insufficient documentation

## 2019-02-07 MED ORDER — EPINEPHRINE 0.3 MG/0.3ML IJ SOAJ: INTRAMUSCULAR | 1 refills | Status: DC

## 2019-02-07 NOTE — Progress Notes (Signed)
[place] [address] Dept: [phone]  FOLLOW UP NOTE  Patient ID: Bradley [name], male    DOB: [birth year]  Age: 23 y.o. MRN: [id number] Date of Office Visit: 02/07/2019  Assessment  Chief Complaint: Food Intolerance (had testing done about a year ago and he would like to have blood test done to see if he is still allergic to shellfish. He had been consuming shellfish prior to the testing with no reactions. )  HPI Bradley [name] is a 23 year old male who presents to the clinic for a follow up visit. He reports his allergic rhinitis has been well controlled with occasional clear rhinorrhea and rare nasal congestion. He denies post nasal drip. He continues Xyzal 5 mg once a day and rarely needs to use Flonase nasal spray. He reports his allergy injections are going well with no adverse reactions. He reports a significant decrease in allergic rhinitis while remaining on allergy injections. He continues to avoid shellfish and has not had any accidental ingestion since his last visit to this clinic. He is interested in retesting for shellfish allergy today. He reports that, prior to last year, he consumed shellfish on a regular basis with one of these, possibly oyster, giving him an uspet stomach. His current medications are listed in the chart.    Drug Allergies:  Allergies  Allergen Reactions  . Shellfish Allergy Anaphylaxis  . Hydrocodone Itching    Pt states he feels itchy after taking, but it doesn't preclude him taking hydrocodone if needed    Physical Exam: BP 126/90 (BP Location: Left Arm, Patient Position: Sitting, Cuff Size: Large)   Pulse 70   Resp 18   Ht 6\' 6"  (1.981 m)   Wt (!) 368 lb (166.9 kg)   SpO2 98%   BMI 42.53 kg/m    Physical Exam Vitals signs reviewed.  Constitutional:      Appearance: Normal appearance.  HENT:     Head: Normocephalic and atraumatic.     Right Ear: Tympanic membrane normal.     Left Ear: Tympanic membrane normal.      Nose: Nose normal.     Mouth/Throat:     Pharynx: Oropharynx is clear.  Eyes:     Conjunctiva/sclera: Conjunctivae normal.  Neck:     Musculoskeletal: Normal range of motion and neck supple.  Cardiovascular:     Rate and Rhythm: Normal rate and regular rhythm.     Heart sounds: Normal heart sounds. No murmur.  Pulmonary:     Effort: Pulmonary effort is normal.     Breath sounds: Normal breath sounds.     Comments: Lungs clear to auscultation Musculoskeletal: Normal range of motion.  Skin:    General: Skin is warm and dry.  Neurological:     Mental Status: He is alert and oriented to person, place, and time.  Psychiatric:        Mood and Affect: Mood normal.        Behavior: Behavior normal.        Thought Content: Thought content normal.        Judgment: Judgment normal.     Assessment and Plan: 1. Anaphylactic shock due to food, subsequent encounter   2. Allergic rhinitis, unspecified seasonality, unspecified trigger     Meds ordered this encounter  Medications  . EPINEPHrine (AUVI-Q) 0.3 mg/0.3 mL IJ SOAJ injection    Sig: Use as directed for severe allergic reaction    Dispense:  2 Device  Refill:  1    [phone] (M)    Patient Instructions  Anaphylactic shock due to food, subsequent encounter  We will get labs to help [place] determine your current food allergy. We will call you as soon as these test results are available. Continue to avoid shellfish. In case of an allergic reaction, take Benadryl 50 mg every 4 hours, and if life-threatening symptoms occur, inject with AuviQ 0.3 mg.  Allergic rhinitis, unspecified seasonality Continue allergen avoidance measures to dust mites, cat, dog, grass pollen, weed pollen, tree pollen, and cockroach Continue Xyzal 5 mg once a day as needed for runny nose or itch Continue Flonase 1-2 sprays in each nostril once a day as needed for a stuffy nose Consider saline nasal spray as needed for nasal symptoms. Use this before any  medicated nasal sprays for best effect Continue allergen immunotherapy and have access to an epinephrine device  Call the clinic if this treatment plan is not working well for you  Follow up in 6 months or sooner if needed   Return in about 6 months (around 08/10/2019), or if symptoms worsen or fail to improve.    Thank you for the opportunity to care for this patient.  Please do not hesitate to contact me with questions.  Gareth Morgan, FNP [facility]

## 2019-02-07 NOTE — Patient Instructions (Signed)
Anaphylactic shock due to food, subsequent encounter  We will get labs to help [place] determine your current food allergy. We will call you as soon as these test results are available. Continue to avoid shellfish. In case of an allergic reaction, take Benadryl 50 mg every 4 hours, and if life-threatening symptoms occur, inject with AuviQ 0.3 mg.  Allergic rhinitis, unspecified seasonality Continue allergen avoidance measures to dust mites, cat, dog, grass pollen, weed pollen, tree pollen, and cockroach Continue Xyzal 5 mg once a day as needed for runny nose or itch Continue Flonase 1-2 sprays in each nostril once a day as needed for a stuffy nose Consider saline nasal spray as needed for nasal symptoms. Use this before any medicated nasal sprays for best effect Continue allergen immunotherapy and have access to an epinephrine device  Call the clinic if this treatment plan is not working well for you  Follow up in 6 months or sooner if needed

## 2019-02-09 LAB — ALLERGEN PROFILE, SHELLFISH
Clam IgE: 0.47 kU/L — AB
F023-IgE Crab: 0.2 kU/L — AB
F080-IgE Lobster: <0.1 kU/L
F290-IgE Oyster: <0.1 kU/L
Scallop IgE: 0.39 kU/L — AB
Shrimp IgE: 1.25 kU/L — AB

## 2019-02-13 ENCOUNTER — Encounter: Payer: Self-pay | Admitting: Physician Assistant

## 2019-02-13 ENCOUNTER — Other Ambulatory Visit: Payer: Self-pay

## 2019-02-13 ENCOUNTER — Emergency Department (HOSPITAL_BASED_OUTPATIENT_CLINIC_OR_DEPARTMENT_OTHER)
Admission: EM | Admit: 2019-02-13 | Discharge: 2019-02-13 | Disposition: A | Discharge status: Home / Self Care | Payer: 59 | Attending: Emergency Medicine | Admitting: Emergency Medicine

## 2019-02-13 ENCOUNTER — Telehealth: Payer: Self-pay

## 2019-02-13 ENCOUNTER — Emergency Department (HOSPITAL_BASED_OUTPATIENT_CLINIC_OR_DEPARTMENT_OTHER): Payer: 59

## 2019-02-13 ENCOUNTER — Encounter (HOSPITAL_BASED_OUTPATIENT_CLINIC_OR_DEPARTMENT_OTHER): Payer: Self-pay | Admitting: *Deleted

## 2019-02-13 DIAGNOSIS — Z79899 Other long term (current) drug therapy: Secondary | ICD-10-CM | POA: Insufficient documentation

## 2019-02-13 DIAGNOSIS — N50812 Left testicular pain: Secondary | ICD-10-CM | POA: Diagnosis present

## 2019-02-13 DIAGNOSIS — I861 Scrotal varices: Secondary | ICD-10-CM | POA: Insufficient documentation

## 2019-02-13 DIAGNOSIS — J45909 Unspecified asthma, uncomplicated: Secondary | ICD-10-CM | POA: Insufficient documentation

## 2019-02-13 LAB — URINALYSIS, ROUTINE W REFLEX MICROSCOPIC
Bilirubin Urine: NEGATIVE
Glucose, UA: NEGATIVE mg/dL
Hgb urine dipstick: NEGATIVE
Ketones, ur: NEGATIVE mg/dL
Leukocytes,Ua: NEGATIVE
Nitrite: NEGATIVE
Protein, ur: NEGATIVE mg/dL
Specific Gravity, Urine: >1.03 — ABNORMAL HIGH (ref 1.005–1.030)
pH: 6 (ref 5.0–8.0)

## 2019-02-13 MED ORDER — NAPROXEN 500 MG PO TABS: 500.0000 mg | ORAL_TABLET | Freq: Two times a day (BID) | ORAL | 0 refills | Status: DC

## 2019-02-13 NOTE — Telephone Encounter (Signed)
Patient was following up on his lab results.   Please Advise.

## 2019-02-13 NOTE — Telephone Encounter (Signed)
Called and went over lab results for shellfish with patient. Patient was asking about the labs for environmental testing. Did not see labs or results for environmental testing. Please advise.

## 2019-02-13 NOTE — Progress Notes (Signed)
Can you please call this patient and let him know that his shellfish labs were consistent with testing from last year. However, these levels are low enough to do skin testing and possibly food challenge the same day if he is interested. I think he is most interested in shrimp. Please remind him not to take antihistamines for 3 days before testing. Thank you

## 2019-02-13 NOTE — Telephone Encounter (Signed)
Bradley [name] were there supposed to be environmental labs performed at his office visit? The patient stated to me during their allergy injections prior to the office visit that he was wanting to have his environmental allergies examined as well. Please advise.

## 2019-02-13 NOTE — Telephone Encounter (Signed)
I did not do environmental testing. He has been receiving allergen immunotherapy for 1 year. We can recheck the environmental panel in about 2-4 years from now. I called his cell with no answer and left a message that we would call him tomorrow. Thank you

## 2019-02-13 NOTE — Telephone Encounter (Signed)
Pt was seen by Bradley [name] at recent visit with labs.  Can you ask if that was performed?  He is on allergy shots thus there would not be a reason to perform environmental allergy testing as he has not completed 3-5 years of injections at this time.

## 2019-02-13 NOTE — Telephone Encounter (Signed)
Noted. Thank You, [name], will follow up with a phone call for [facility] tomorrow to advise.

## 2019-02-13 NOTE — ED Provider Notes (Signed)
[facility] Provider Note   CSN: [id number] Arrival date & time: 02/13/19  1524    History   Chief Complaint Chief Complaint  Patient presents with  . Testicle Pain    HPI Bradley [name] is a 23 y.o. male.     HPI Patient has had left testicular pain for 2 days.  Is been gradual in onset.  Aching in quality.  No swelling or redness.  No pain burning urgency with urination.  She is sexually active.  He does not suspect sexually transmitted disease.  No drainage or discharge from the penis. Past Medical History:  Diagnosis Date  . Anxiety   . Chronic headaches   . Clavicular fracture   . History of chickenpox   . Mild intermittent asthma 06/07/2018  . Shingles    23 years old.    Patient Active Problem List   Diagnosis Date Noted  . 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 Hamstring injury, right, initial encounter 01/23/2019  . Migraine without aura and without status migrainosus, not intractable 07/10/2018  . Morbid obesity with BMI of 40.0-44.9, adult ([place]) 07/10/2018  . Allergic rhinitis 06/07/2018  . Mild intermittent asthma 06/07/2018  . Atypical chest pain 12/21/2017  . Generalized abdominal pain 12/17/2017  . Anxiety 11/28/2017    Past Surgical History:  Procedure Laterality Date  . WISDOM TOOTH EXTRACTION          Home Medications    Prior to Admission medications   Medication Sig Start Date End Date Taking? Authorizing Provider  levocetirizine (XYZAL) 5 MG tablet Take 5 mg by mouth every evening.   Yes [provider]  propranolol (INDERAL) 20 MG tablet Take 1 tablet (20 mg total) by mouth 2 (two) times daily. 02/03/19  Yes Kathrynn Ducking, MD  EPINEPHrine (AUVI-Q) 0.3 mg/0.3 mL IJ SOAJ injection Use as directed for severe allergic reaction 02/07/19   Ambs, Kathrine Cords, FNP  ibuprofen (ADVIL) 800 MG tablet Take 1 tablet (800 mg total) by mouth 3 (three) times daily. 01/12/19   Wieters, Hallie C,  PA-C  naproxen (NAPROSYN) 500 MG tablet Take 1 tablet (500 mg total) by mouth 2 (two) times daily. 02/13/19   Charlesetta Shanks, MD    Family History Family History  Problem Relation Age of Onset  . Hypertension Mother   . Colon polyps Mother        might be mistakrn  . Diabetes Father        type II  . Migraines Sister   . Colon cancer Neg Hx   . Esophageal cancer Neg Hx     Social History Social History   Tobacco Use  . Smoking status: Never Smoker  . Smokeless tobacco: Never Used  Substance Use Topics  . Alcohol use: Yes    Frequency: Never    Comment: Seldom  . Drug use: Not Currently    Types: Marijuana    Comment: rare use     Allergies   Shellfish allergy and Hydrocodone   Review of Systems Review of Systems 10 Systems reviewed and are negative for acute change except as noted in the HPI.   Physical Exam Updated Vital Signs BP 135/78   Pulse 82   Temp 98.1 F (36.7 C) (Oral)   Resp 18   Ht 6\' 6"  (1.981 m)   Wt (!) 166.9 kg   SpO2 99%   BMI 42.52 kg/m   Physical Exam Constitutional:  Appearance: He is well-developed.  HENT:     Head: Normocephalic and atraumatic.  Eyes:     Extraocular Movements: Extraocular movements intact.  Cardiovascular:     Rate and Rhythm: Normal rate and regular rhythm.     Heart sounds: Normal heart sounds.  Pulmonary:     Effort: Pulmonary effort is normal.     Breath sounds: Normal breath sounds.  Abdominal:     General: Bowel sounds are normal. There is no distension.     Palpations: Abdomen is soft.     Tenderness: There is no abdominal tenderness.  Genitourinary:    Comments: No penile swelling or drainage.  No scrotal swelling or enlargement.  Mild tenderness to palpation of the left testicle.  Smooth contours.  No mass in the inguinal canal. Musculoskeletal: Normal range of motion.  Skin:    General: Skin is warm and dry.  Neurological:     Mental Status: He is alert and oriented to person, place,  and time.     GCS: GCS eye subscore is 4. GCS verbal subscore is 5. GCS motor subscore is 6.     Coordination: Coordination normal.  Psychiatric:        Mood and Affect: Mood normal.      ED Treatments / Results  Labs (all labs ordered are listed, but only abnormal results are displayed) Labs Reviewed  URINALYSIS, ROUTINE W REFLEX MICROSCOPIC - Abnormal; Notable for the following components:      Result Value   Specific Gravity, Urine >1.030 (*)    All other components within normal limits  GC/CHLAMYDIA PROBE AMP ([place]) NOT AT [facility]    EKG None  Radiology US Scrotum W/doppler  Result Date: 02/13/2019 CLINICAL DATA:  Left testicular pain, history of left varicocele EXAM: SCROTAL ULTRASOUND DOPPLER ULTRASOUND OF THE TESTICLES TECHNIQUE: Complete ultrasound examination of the testicles, epididymis, and other scrotal structures was performed. Color and spectral Doppler ultrasound were also utilized to evaluate blood flow to the testicles. COMPARISON:  None. FINDINGS: Right testicle Measurements: 4.3 x 2.2 x 2.5 cm. No mass or microlithiasis visualized. Left testicle Measurements: 4.3 x 2.3 x 2.6 cm. No mass or microlithiasis visualized. Right epididymis:  Normal in size and appearance. Left epididymis:  Normal in size and appearance. Hydrocele:  None visualized. Varicocele:  Positive for left varicocele Pulsed Doppler interrogation of both testes demonstrates normal low resistance arterial and venous waveforms bilaterally. IMPRESSION: No testicular abnormality or evidence of torsion. Left side varicocele. Electronically Signed   By: Rolm Baptise M.D.   On: 02/13/2019 17:12    Procedures Procedures (including critical care time)  Medications Ordered in ED Medications - No data to display   Initial Impression / Assessment and Plan / ED Course  I have reviewed the triage vital signs and the nursing notes.  Pertinent labs & imaging results that were available during my care of  the patient were reviewed by me and considered in my medical decision making (see chart for details).        Ultrasound identifies varicocele.  Patient may be having discomfort with this.  Otherwise normal testicular exam.  Patient is to follow-up with [facility].  Final Clinical Impressions(s) / ED Diagnoses   Final diagnoses:  Testicular pain, left  Varicocele    ED Discharge Orders         Ordered    naproxen (NAPROSYN) 500 MG tablet  2 times daily     02/13/19 1723  Charlesetta Shanks, MD 02/13/19 [phone]

## 2019-02-13 NOTE — Discharge Instructions (Addendum)
1.  Take naproxen twice a day for pain. 2.  Wear supportive undergarments.  Rest with your testicles elevated. 3.  Follow-up with a urologist if you have continued pain.  Call [facility] to schedule an appointment.

## 2019-02-13 NOTE — ED Triage Notes (Signed)
Left testicular pain since yesterday. Hx of same pain a few months ago that was negative for torsion. Denies discharge.

## 2019-02-14 LAB — GC/CHLAMYDIA PROBE AMP (~~LOC~~) NOT AT ARMC
Chlamydia: NEGATIVE
Neisseria Gonorrhea: NEGATIVE

## 2019-02-14 NOTE — Telephone Encounter (Signed)
Called patient and advised. Patient verbalized understanding.  

## 2019-02-18 ENCOUNTER — Other Ambulatory Visit: Payer: Self-pay | Admitting: Physician Assistant

## 2019-02-23 ENCOUNTER — Encounter: Payer: Self-pay | Admitting: Physician Assistant

## 2019-02-24 ENCOUNTER — Other Ambulatory Visit: Payer: Self-pay

## 2019-02-24 ENCOUNTER — Ambulatory Visit (HOSPITAL_COMMUNITY)
Admission: EM | Admit: 2019-02-24 | Discharge: 2019-02-24 | Disposition: A | Discharge status: Home / Self Care | Payer: 59 | Attending: Family Medicine | Admitting: Family Medicine

## 2019-02-24 ENCOUNTER — Encounter (HOSPITAL_COMMUNITY): Payer: Self-pay

## 2019-02-24 DIAGNOSIS — L739 Follicular disorder, unspecified: Secondary | ICD-10-CM | POA: Diagnosis not present

## 2019-02-24 NOTE — ED Provider Notes (Signed)
Bradley [name]    CSN: [id number] Arrival date & time: 02/24/19  1720     History   Chief Complaint Chief Complaint  Patient presents with  . Abscess    HPI Bradley [name] is a 23 y.o. male presenting for concern of abscess on right testicle.  Followed by urology, states that he underwent a procedure to remove neo-scrotum 6 days ago.  It was shaved for procedure, and he was not given antibiotics at discharge.  Patient feels that his wound is healing well, pain controlled.  Patient states he noticed a small "pimple-looking thing "yesterday.  States he called his urologist who offered him an appointment tomorrow, though patient wanted to make sure breathing is okay today so they recommended urgent care.  Patient endorses pain when squeezing.  Denies testicular pain, penile pain, fever, redness, opening, discharge.    Past Medical History:  Diagnosis Date  . Anxiety   . Chronic headaches   . Clavicular fracture   . History of chickenpox   . Mild intermittent asthma 06/07/2018  . Shingles    23 years old.    Patient Active Problem List   Diagnosis Date Noted  . 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 Hamstring injury, right, initial encounter 01/23/2019  . Migraine without aura and without status migrainosus, not intractable 07/10/2018  . Morbid obesity with BMI of 40.0-44.9, adult ([place]) 07/10/2018  . Allergic rhinitis 06/07/2018  . Mild intermittent asthma 06/07/2018  . Atypical chest pain 12/21/2017  . Generalized abdominal pain 12/17/2017  . Anxiety 11/28/2017    Past Surgical History:  Procedure Laterality Date  . WISDOM TOOTH EXTRACTION         Home Medications    Prior to Admission medications   Medication Sig Start Date End Date Taking? Authorizing Provider  EPINEPHrine (AUVI-Q) 0.3 mg/0.3 mL IJ SOAJ injection Use as directed for severe allergic reaction 02/07/19   Ambs, Kathrine Cords, FNP  ibuprofen (ADVIL) 800 MG tablet Take 1 tablet  (800 mg total) by mouth 3 (three) times daily. 01/12/19   Wieters, Hallie C, PA-C  levocetirizine (XYZAL) 5 MG tablet Take 5 mg by mouth every evening.    [provider]  naproxen (NAPROSYN) 500 MG tablet Take 1 tablet (500 mg total) by mouth 2 (two) times daily. 02/13/19   Charlesetta Shanks, MD  propranolol (INDERAL) 20 MG tablet Take 1 tablet (20 mg total) by mouth 2 (two) times daily. 02/03/19   Kathrynn Ducking, MD    Family History Family History  Problem Relation Age of Onset  . Hypertension Mother   . Colon polyps Mother        might be mistakrn  . Diabetes Father        type II  . Migraines Sister   . Colon cancer Neg Hx   . Esophageal cancer Neg Hx     Social History Social History   Tobacco Use  . Smoking status: Never Smoker  . Smokeless tobacco: Never Used  Substance Use Topics  . Alcohol use: Yes    Frequency: Never    Comment: Seldom  . Drug use: Not Currently    Types: Marijuana    Comment: rare use     Allergies   Shellfish allergy and Hydrocodone   Review of Systems As per HPI   Physical Exam Triage Vital Signs ED Triage Vitals  Enc Vitals Group     BP --      Pulse  Rate 02/24/19 1810 87     Resp 02/24/19 1810 18     Temp 02/24/19 1810 98.1 F (36.7 C)     Temp Source 02/24/19 1810 Oral     SpO2 02/24/19 1810 100 %     Weight 02/24/19 1809 (!) 360 lb (163.3 kg)     Height --      Head Circumference --      Peak Flow --      Pain Score 02/24/19 1809 2     Pain Loc --      Pain Edu? --      Excl. in [place]? --    No data found.  Updated Vital Signs Pulse 87   Temp 98.1 F (36.7 C) (Oral)   Resp 18   Wt (!) 360 lb (163.3 kg)   SpO2 100%   BMI 41.60 kg/m   Visual Acuity Right Eye Distance:   Left Eye Distance:   Bilateral Distance:    Right Eye Near:   Left Eye Near:    Bilateral Near:     Physical Exam Constitutional:      General: He is not in acute distress.    Appearance: He is obese.  HENT:     Head:  Normocephalic and atraumatic.  Eyes:     General: No scleral icterus.    Pupils: Pupils are equal, round, and reactive to light.  Cardiovascular:     Rate and Rhythm: Normal rate.  Pulmonary:     Effort: Pulmonary effort is normal.  Genitourinary:    Comments: 1 cm incisional scar that is well-healing and without erythema or tenderness to palpation..  Lateral to incision is small pinpoint area of erythema with whitehead.  Mildly tender to palpation, no area of fluctuancy or induration appreciated. Skin:    Coloration: Skin is not jaundiced or pale.  Neurological:     Mental Status: He is alert and oriented to person, place, and time.      UC Treatments / Results  Labs (all labs ordered are listed, but only abnormal results are displayed) Labs Reviewed - No data to display  EKG None  Radiology No results found.  Procedures Procedures (including critical care time)  Medications Ordered in UC Medications - No data to display  Initial Impression / Assessment and Plan / UC Course  I have reviewed the triage vital signs and the nursing notes.  Pertinent labs & imaging results that were available during my care of the patient were reviewed by me and considered in my medical decision making (see chart for details).     23 year old male 6 days status post urologic procedure presenting with acute concern of testicular abscess.  Physical exam reassuring, provided reassurance that this is likely folliculitis second to recent shaving for procedure.  Patient to apply hot compresses, and follow-up with urology in case symptoms worsen.  Patient okay with plan. Final Clinical Impressions(s) / UC Diagnoses   Final diagnoses:  Folliculitis     Discharge Instructions     Keep the area clean and dry. Apply hot compresses 3-4 times daily for additional pain relief, and to reduce swelling. Keep wound check appointment with urologist as previously scheduled. Return if area becomes  enlarged, feels as if there is a boil, hot to touch, begins to drain pus, or you develop testicular pain.    ED Prescriptions    None     Controlled Substance Prescriptions [place] Controlled Substance Registry consulted? Not Applicable   [place],  [place], PA-C 02/24/19 2037

## 2019-02-24 NOTE — Discharge Instructions (Addendum)
Keep the area clean and dry. Apply hot compresses 3-4 times daily for additional pain relief, and to reduce swelling. Keep wound check appointment with urologist as previously scheduled. Return if area becomes enlarged, feels as if there is a boil, hot to touch, begins to drain pus, or you develop testicular pain.

## 2019-02-24 NOTE — ED Triage Notes (Signed)
Pt has a pimple hard (abcsess) near his testicles pt just had a abscess drained he needs to have a wound check in the same place.

## 2019-02-25 ENCOUNTER — Telehealth: Payer: Self-pay | Admitting: Physician Assistant

## 2019-02-25 NOTE — Telephone Encounter (Signed)
As long as patient screening is negative. Being seen in the office is ok? I ok'd for tomorrow at 1pm

## 2019-02-25 NOTE — Telephone Encounter (Signed)
Pt states that he having stomach issues with pain and nausea. Pt is asking for an in office visit due to phone camera not being the best. Pt states that he has no other way of doing a VV, please advise if ok to schedule in office visit

## 2019-02-26 ENCOUNTER — Other Ambulatory Visit: Payer: Self-pay

## 2019-02-26 ENCOUNTER — Encounter: Payer: Self-pay | Admitting: Physician Assistant

## 2019-02-26 ENCOUNTER — Ambulatory Visit: Payer: 59 | Admitting: Physician Assistant

## 2019-02-26 VITALS — BP 122/80 | HR 74 | Temp 98.0°F | Resp 16 | Ht 78.0 in | Wt 369.0 lb

## 2019-02-26 DIAGNOSIS — R1084 Generalized abdominal pain: Secondary | ICD-10-CM | POA: Diagnosis not present

## 2019-02-26 LAB — CBC WITH DIFFERENTIAL/PLATELET
Basophils Absolute: 0.1 10*3/uL (ref 0.0–0.1)
Basophils Relative: 0.9 % (ref 0.0–3.0)
Eosinophils Absolute: 0.1 10*3/uL (ref 0.0–0.7)
Eosinophils Relative: 1.2 % (ref 0.0–5.0)
HCT: 43.4 % (ref 39.0–52.0)
Hemoglobin: 14.9 g/dL (ref 13.0–17.0)
Lymphocytes Relative: 29.5 % (ref 12.0–46.0)
Lymphs Abs: 1.7 10*3/uL (ref 0.7–4.0)
MCHC: 34.2 g/dL (ref 30.0–36.0)
MCV: 88.7 fl (ref 78.0–100.0)
Monocytes Absolute: 0.4 10*3/uL (ref 0.1–1.0)
Monocytes Relative: 7.1 % (ref 3.0–12.0)
Neutro Abs: 3.5 10*3/uL (ref 1.4–7.7)
Neutrophils Relative %: 61.3 % (ref 43.0–77.0)
Platelets: 201 10*3/uL (ref 150.0–400.0)
RBC: 4.9 Mil/uL (ref 4.22–5.81)
RDW: 12.8 % (ref 11.5–15.5)
WBC: 5.8 10*3/uL (ref 4.0–10.5)

## 2019-02-26 LAB — COMPREHENSIVE METABOLIC PANEL
ALT: 24 U/L (ref 0–53)
AST: 15 U/L (ref 0–37)
Albumin: 4.4 g/dL (ref 3.5–5.2)
Alkaline Phosphatase: 59 U/L (ref 39–117)
BUN: 12 mg/dL (ref 6–23)
CO2: 24 mEq/L (ref 19–32)
Calcium: 9.2 mg/dL (ref 8.4–10.5)
Chloride: 103 mEq/L (ref 96–112)
Creatinine, Ser: 0.8 mg/dL (ref 0.40–1.50)
GFR: 119.1 mL/min (ref >60.00)
Glucose, Bld: 83 mg/dL (ref 70–99)
Potassium: 4.3 mEq/L (ref 3.5–5.1)
Sodium: 135 mEq/L (ref 135–145)
Total Bilirubin: 0.6 mg/dL (ref 0.2–1.2)
Total Protein: 6.7 g/dL (ref 6.0–8.3)

## 2019-02-26 NOTE — Patient Instructions (Addendum)
Please keep hydrated. Continue probiotic daily as directed. Zofran if needed for nausea. I have sent in a refill.  Continue a bland diet, working back up to all the veggies you had been eating in the past 2 weeks.  If symptoms are not continuing to ease up we will proceed with imaging or assessment with GI.    Bland Diet A bland diet consists of foods that are often soft and do not have a lot of fat, fiber, or extra seasonings. Foods without fat, fiber, or seasoning are easier for the body to digest. They are also less likely to irritate your mouth, throat, stomach, and other parts of your digestive system. A bland diet is sometimes called a BRAT diet. What is my plan? Your health care provider or food and nutrition specialist (dietitian) may recommend specific changes to your diet to prevent symptoms or to treat your symptoms. These changes may include:  Eating small meals often.  Cooking food until it is soft enough to chew easily.  Chewing your food well.  Drinking fluids slowly.  Not eating foods that are very spicy, sour, or fatty.  Not eating citrus fruits, such as oranges and grapefruit. What do I need to know about this diet?  Eat a variety of foods from the bland diet food list.  Do not follow a bland diet longer than needed.  Ask your health care provider whether you should take vitamins or supplements. What foods can I eat? Grains  Hot cereals, such as cream of wheat. Rice. Bread, crackers, or tortillas made from refined white flour. Vegetables Canned or cooked vegetables. Mashed or boiled potatoes. Fruits  Bananas. Applesauce. Other types of cooked or canned fruit with the skin and seeds removed, such as canned peaches or pears. Meats and other proteins  Scrambled eggs. Creamy peanut butter or other nut butters. Lean, well-cooked meats, such as chicken or fish. Tofu. Soups or broths. Dairy Low-fat dairy products, such as milk, cottage cheese, or  yogurt. Beverages  Water. Herbal tea. Apple juice. Fats and oils Mild salad dressings. Canola or olive oil. Sweets and desserts Pudding. Custard. Fruit gelatin. Ice cream. The items listed above may not be a complete list of recommended foods and beverages. Contact a dietitian for more options. What foods are not recommended? Grains Whole grain breads and cereals. Vegetables Raw vegetables. Fruits Raw fruits, especially citrus, berries, or dried fruits. Dairy Whole fat dairy foods. Beverages Caffeinated drinks. Alcohol. Seasonings and condiments Strongly flavored seasonings or condiments. Hot sauce. Salsa. Other foods Spicy foods. Fried foods. Sour foods, such as pickled or fermented foods. Foods with high sugar content. Foods high in fiber. The items listed above may not be a complete list of foods and beverages to avoid. Contact a dietitian for more information. Summary  A bland diet consists of foods that are often soft and do not have a lot of fat, fiber, or extra seasonings.  Foods without fat, fiber, or seasoning are easier for the body to digest.  Check with your health care provider to see how long you should follow this diet plan. It is not meant to be followed for long periods. This information is not intended to replace advice given to you by your health care provider. Make sure you discuss any questions you have with your health care provider. Document Released: 01/03/2016 Document Revised: 10/10/2017 Document Reviewed: 10/10/2017 Elsevier Interactive Patient Education  2019 [employer].

## 2019-02-26 NOTE — Progress Notes (Signed)
Patient presents to clinic today c/o 1 week of intermittent abdominal cramping with episodes of nausea. Denies vomiting. Denies fever, chills. Pain is in middle of abdomen when occurring, sometimes dull and sometimes sharp. Only lasts a few seconds when present. Denies heartburn or indigestion. Notes some loose stools with increased frequency starting Monday lasting for a couple of days but stools are back to normal today per patient. Denie melena or hematochezia. Denies change to urinary symptoms. Was just started on Bactrim for folliculitis of scrotum by Urologist yesterday. States he mentioned symptoms to them but they did not find anything concerning on examination.  Patient with history of IBS. Denies abnormal food or water source. Denies recent travel or sick contact.  Past Medical History:  Diagnosis Date  . Anxiety   . Chronic headaches   . Clavicular fracture   . History of chickenpox   . Mild intermittent asthma 06/07/2018  . Shingles    23 years old.    Current Outpatient Medications on File Prior to Visit  Medication Sig Dispense Refill  . EPINEPHrine (AUVI-Q) 0.3 mg/0.3 mL IJ SOAJ injection Use as directed for severe allergic reaction 2 Device 1  . levocetirizine (XYZAL) 5 MG tablet Take 5 mg by mouth every evening.    . ondansetron (ZOFRAN) 8 MG tablet     . propranolol (INDERAL) 20 MG tablet Take 1 tablet (20 mg total) by mouth 2 (two) times daily. 60 tablet 3  . sulfamethoxazole-trimethoprim (BACTRIM DS) 800-160 MG tablet Take 1 tablet by mouth 2 (two) times daily.     No current facility-administered medications on file prior to visit.     Allergies  Allergen Reactions  . Shellfish Allergy Anaphylaxis  . Hydrocodone Itching    Pt states he feels itchy after taking, but it doesn't preclude him taking hydrocodone if needed    Family History  Problem Relation Age of Onset  . Hypertension Mother   . Colon polyps Mother        might be mistakrn  . Diabetes Father         type II  . Migraines Sister   . Colon cancer Neg Hx   . Esophageal cancer Neg Hx     Social History   Socioeconomic History  . Marital status: Single    Spouse name: Not on file  . Number of children: 0  . Years of education: College  . Highest education level: Not on file  Occupational History  . Occupation: [employer]  . Financial resource strain: Not on file  . Food insecurity:    Worry: Not on file    Inability: Not on file  . Transportation needs:    Medical: Not on file    Non-medical: Not on file  Tobacco Use  . Smoking status: Never Smoker  . Smokeless tobacco: Never Used  Substance and Sexual Activity  . Alcohol use: Yes    Frequency: Never    Comment: Seldom  . Drug use: Not Currently    Types: Marijuana    Comment: rare use  . Sexual activity: Yes    Partners: Female  Lifestyle  . Physical activity:    Days per week: Not on file    Minutes per session: Not on file  . Stress: Not on file  Relationships  . Social connections:    Talks on phone: Not on file    Gets together: Not on file    Attends religious service: Not on  file    Active member of club or organization: Not on file    Attends meetings of clubs or organizations: Not on file    Relationship status: Not on file  Other Topics Concern  . Not on file  Social History Narrative   Lives alone   Caffeine use: 1 cup coffee per day   Right handed    Review of Systems - See HPI.  All other ROS are negative.  BP 122/80   Pulse 74   Temp 98 F (36.7 C) (Skin)   Resp 16   Wt (!) 369 lb (167.4 kg)   SpO2 98%   BMI 42.64 kg/m   Physical Exam Vitals signs reviewed.  Constitutional:      Appearance: He is well-developed. He is obese.  HENT:     Head: Normocephalic and atraumatic.     Mouth/Throat:     Mouth: Mucous membranes are moist.  Cardiovascular:     Rate and Rhythm: Normal rate and regular rhythm.     Heart sounds: Normal heart sounds.  Pulmonary:      Effort: Pulmonary effort is normal. No respiratory distress.     Breath sounds: No wheezing.  Abdominal:     General: Bowel sounds are normal.     Palpations: Abdomen is soft. There is no hepatomegaly, splenomegaly, mass or pulsatile mass.     Tenderness: There is generalized abdominal tenderness.     Hernia: No hernia is present.  Neurological:     General: No focal deficit present.     Mental Status: He is alert and oriented to person, place, and time.  Psychiatric:        Mood and Affect: Mood normal.    Recent Results (from the past 2160 hour(s))  Allergen Profile, Shellfish     Status: Abnormal   Collection Time: 02/07/19  9:59 AM  Result Value Ref Range   Class Description Comment     Comment:     Levels of Specific IgE       Class  Description of Class     ---------------------------  -----  --------------------                    < 0.10         0         Negative            0.10 -    0.31         0/I       Equivocal/Low            0.32 -    0.55         I         Low            0.56 -    1.40         II        Moderate            1.41 -    3.90         III       High            3.91 -   19.00         IV        Very High           19.01 -  100.00         V  Very High                   >100.00         VI        Very High    Clam IgE 0.47 (A) Class I kU/L   F023-IgE Crab 0.20 (A) Class 0/I kU/L   Shrimp IgE 1.25 (A) Class II kU/L   Scallop IgE 0.39 (A) Class I kU/L   F290-IgE Oyster <0.10 Class 0 kU/L   F080-IgE Lobster <0.10 Class 0 kU/L  GC/Chlamydia probe amp     Status: None   Collection Time: 02/13/19 12:00 AM  Result Value Ref Range   Chlamydia Negative     Comment: Normal Reference Range - Negative   Neisseria gonorrhea Negative     Comment: Normal Reference Range - Negative  Urinalysis, Routine w reflex microscopic     Status: Abnormal   Collection Time: 02/13/19  3:39 PM  Result Value Ref Range   Color, Urine YELLOW YELLOW   APPearance CLEAR CLEAR    Specific Gravity, Urine >1.030 (H) 1.005 - 1.030   pH 6.0 5.0 - 8.0   Glucose, UA NEGATIVE NEGATIVE mg/dL   Hgb urine dipstick NEGATIVE NEGATIVE   Bilirubin Urine NEGATIVE NEGATIVE   Ketones, ur NEGATIVE NEGATIVE mg/dL   Protein, ur NEGATIVE NEGATIVE mg/dL   Nitrite NEGATIVE NEGATIVE   Leukocytes,Ua NEGATIVE NEGATIVE    Comment: Microscopic not done on urines with negative protein, blood, leukocytes, nitrite, or glucose < 500 mg/dL. Performed at [facility], [address] [place], [place] [address]    Assessment/Plan: 1. Generalized abdominal pain Unclear etiology. He states pain is 10/10 when present. 1 episodes of pain in office noted by patient without signs of pain or distress. Is calmly talking throughout visit. Endorses generalized abdominal tenderness with palpation but when repeating palpation while he is distracted he shows no signs of significant discomfort.Has been seen multiple times in the past week by various providers with varying complaints. So it makes it harder to tell when there is truly something major going on or if this is a minor flare of IBS or resolving mild gastroenteritis. Will check labs today to assess for infection. Recommended rest. Tylenol if needed. Discussed dietary recommendations. If labs abnormal or symptoms not continuing to improve, will proceed with imaging.   - CBC w/Diff - Comp Met (CMET)   Leeanne Rio, PA-C

## 2019-02-28 ENCOUNTER — Encounter: Payer: Self-pay | Admitting: Gastroenterology

## 2019-02-28 ENCOUNTER — Other Ambulatory Visit: Payer: Self-pay

## 2019-02-28 ENCOUNTER — Ambulatory Visit (INDEPENDENT_AMBULATORY_CARE_PROVIDER_SITE_OTHER): Payer: 59 | Admitting: Gastroenterology

## 2019-02-28 ENCOUNTER — Ambulatory Visit: Payer: 59 | Admitting: Gastroenterology

## 2019-02-28 VITALS — BP 114/76 | HR 86 | Temp 98.4°F | Ht 78.0 in | Wt 368.5 lb

## 2019-02-28 DIAGNOSIS — R112 Nausea with vomiting, unspecified: Secondary | ICD-10-CM | POA: Diagnosis not present

## 2019-02-28 DIAGNOSIS — R1084 Generalized abdominal pain: Secondary | ICD-10-CM | POA: Diagnosis not present

## 2019-02-28 DIAGNOSIS — R195 Other fecal abnormalities: Secondary | ICD-10-CM

## 2019-02-28 DIAGNOSIS — K582 Mixed irritable bowel syndrome: Secondary | ICD-10-CM | POA: Diagnosis not present

## 2019-02-28 MED ORDER — SOD PICOSULFATE-MAG OX-CIT ACD 10-3.5-12 MG-GM -GM/160ML PO SOLN: 1.0000 | ORAL | 0 refills | Status: DC

## 2019-02-28 MED ORDER — HYOSCYAMINE SULFATE 0.125 MG SL SUBL: 0.1250 mg | SUBLINGUAL_TABLET | Freq: Four times a day (QID) | SUBLINGUAL | 0 refills | Status: DC | PRN

## 2019-02-28 NOTE — Progress Notes (Signed)
Chief Complaint: Abdominal pain, change in bowel habits   Referring Provider:     Brunetta Jeans, PA-C   HPI:     Bradley [name] is a 23 y.o. male with a history of obesity, mild intermittent asthma, anxiety, initially seen by me in 11/2018 for abdominal pain and change in bowel habits.  Symptoms have been essentially lifelong, increasing in frequency/severity over the last year or so.  Endorses intermittent abdominal cramping, generalized abdominal pain, nausea without emesis.  Pain described as dull but occasionally sharp spots of pain, lasting a few seconds.  These exacerbations can be anywhere in the abdomen, and are nonreproducible. Otherwise no fever, chills.  No hematochezia or melena.  Pain not associated with p.o. intake or BM.  Additionally with alternating bowel habits.  Has increased dietary fiber without any appreciable improvement in bowel habits. Still alternating diarrhea/constipation. Still taking Probiotic, which had previously helped. Drinks 6-7 bottles of water/day.  No weight loss, fever, chills, night sweats.  Nausea without emesis over last week or so. Improves with Zofran.   Started on Bactrim for folliculitis of scrotum earlier this week. Otherwise, he is otherwise without preceding exposures to include recent antibiotics, hospitalization, sick contacts, travel and denies new medications, supplements, OTCs.  CBC and CMP normal this week.  Normal RUQ [place] in 10/2018 (had RUQ pain at that time).  Previous normal CBC, CMP, TSH, negative C diff, stool culture.  Was previously offered EGD/colonoscopy, but wanted to proceed with conservative management to start.   Past Medical History:  Diagnosis Date  . Anxiety   . Chronic headaches   . Clavicular fracture   . History of chickenpox   . Mild intermittent asthma 06/07/2018  . Shingles    23 years old.     Past Surgical History:  Procedure Laterality Date  . CYSTECTOMY  01/2019   scrotum area   .  WISDOM TOOTH EXTRACTION     Family History  Problem Relation Age of Onset  . Hypertension Mother   . Colon polyps Mother        might be mistakrn  . Diabetes Father        type II  . Migraines Sister   . Colon cancer Neg Hx   . Esophageal cancer Neg Hx    Social History   Tobacco Use  . Smoking status: Never Smoker  . Smokeless tobacco: Never Used  Substance Use Topics  . Alcohol use: Yes    Frequency: Never    Comment: Seldom  . Drug use: Not Currently    Types: Marijuana    Comment: rare use   Current Outpatient Medications  Medication Sig Dispense Refill  . EPINEPHrine (AUVI-Q) 0.3 mg/0.3 mL IJ SOAJ injection Use as directed for severe allergic reaction 2 Device 1  . levocetirizine (XYZAL) 5 MG tablet Take 5 mg by mouth every evening.    . ondansetron (ZOFRAN) 8 MG tablet 8 mg as needed.     . Probiotic Product ([place]) CAPS Take 1 capsule by mouth daily.    . propranolol (INDERAL) 20 MG tablet Take 1 tablet (20 mg total) by mouth 2 (two) times daily. 60 tablet 3  . sulfamethoxazole-trimethoprim (BACTRIM DS) 800-160 MG tablet Take 1 tablet by mouth 2 (two) times daily.     No current facility-administered medications for this visit.    Allergies  Allergen Reactions  . Shellfish Allergy Anaphylaxis  .  Hydrocodone Itching    Pt states he feels itchy after taking, but it doesn't preclude him taking hydrocodone if needed     Review of Systems: All systems reviewed and negative except where noted in HPI.     Physical Exam:    Wt Readings from Last 3 Encounters:  02/28/19 (!) 368 lb 8 oz (167.2 kg)  02/26/19 (!) 369 lb (167.4 kg)  02/24/19 (!) 360 lb (163.3 kg)    BP 114/76   Pulse 86   Temp 98.4 F (36.9 C)   Ht 6\' 6"  (1.981 m)   Wt (!) 368 lb 8 oz (167.2 kg)   BMI 42.58 kg/m  Constitutional:  Pleasant, in no acute distress. Psychiatric: Normal mood and affect. Behavior is normal. EENT: Pupils normal.  Conjunctivae are normal. No  scleral icterus. Neck supple. No cervical LAD. Cardiovascular: Normal rate, regular rhythm. No edema Pulmonary/chest: Effort normal and breath sounds normal. No wheezing, rales or rhonchi. Abdominal: Soft, nondistended, nontender. Bowel sounds active throughout. There are no masses palpable. No hepatomegaly. Neurological: Alert and oriented to person place and time. Skin: Skin is warm and dry. No rashes noted.   ASSESSMENT AND PLAN;   Bradley [name] is a 23 y.o. male presenting with:  1) Alternating bowel habits 2) Generalized abdominal pain 3) IBS mixed type 4) Nausea without emesis  As previously discussed, clinical presentation seems most consistent with IBS mixed type.  Has had some improvement, but not resolution with high-fiber diet and trial of probiotics.  Plan for the following:  - Resume high-fiber diet -Okay to resume probiotic - Trial course of Levsin - Antiemetic prn ok to use - EGD with gastric/duodenal biopsies and colonoscopy with biopsies to evaluate for mucosal/luminal etiology for symptoms - Has made some excellent dietary modifications.  If the above unrevealing, will again review low FODMAP diet for additional dietary benefit - Discussed pathophysiology of IBS at length -Very briefly discussed neuromodulation benefit of SSRI, TCA, etc.  Can evaluate for efficacy with Levsin first and discuss further as appropriate -Can consider glutamine or rifaximin in future pending above -RTC after above evaluation   The indications, risks, and benefits of EGD and colonoscopy were explained to the patient in detail. Risks include but are not limited to bleeding, perforation, adverse reaction to medications, and cardiopulmonary compromise. Sequelae include but are not limited to the possibility of surgery, hositalization, and mortality. The patient verbalized understanding and wished to proceed. All questions answered, referred to scheduler and bowel prep ordered. Further  recommendations pending results of the exam.   I spent a total of 25 minutes of face-to-face time with the patient. Greater than 50% of the time was spent counseling and coordinating care.     Lavena Bullion, DO, FACG  02/28/2019, 3:17 PM   Brunetta Jeans, PA-C

## 2019-02-28 NOTE — Patient Instructions (Signed)
If you are age 24 or older, your body mass index should be between 23-30. Your Body mass index is 42.58 kg/m. If this is out of the aforementioned range listed, please consider follow up with your Primary Care Provider.  If you are age 48 or younger, your body mass index should be between 19-25. Your Body mass index is 42.58 kg/m. If this is out of the aformentioned range listed, please consider follow up with your Primary Care Provider.   To help prevent the possible spread of infection to our patients, communities, and staff; we will be implementing the following measures:  As of now we are not allowing any visitors/family members to accompany you to any upcoming appointments with [facility]. If you have any concerns about this please contact our office to discuss prior to the appointment.   You have been scheduled for an endoscopy and colonoscopy. Please follow the written instructions given to you at your visit today. Please pick up your prep supplies at the pharmacy within the next 1-3 days. If you use inhalers (even only as needed), please bring them with you on the day of your procedure. Your physician has requested that you go to www.startemmi.com and enter the access code given to you at your visit today. This web site gives a general overview about your procedure. However, you should still follow specific instructions given to you by our office regarding your preparation for the procedure.  We have sent the following medications to your pharmacy for you to pick up at your convenience: Clenpiq Levsin 0.125mg  every 6 hours as needed.  It was a pleasure to see you today!  Vito Cirigliano, D.O.

## 2019-03-04 ENCOUNTER — Encounter: Payer: Self-pay | Admitting: Neurology

## 2019-03-04 ENCOUNTER — Ambulatory Visit: Payer: 59 | Admitting: Neurology

## 2019-03-04 ENCOUNTER — Other Ambulatory Visit: Payer: Self-pay

## 2019-03-04 ENCOUNTER — Ambulatory Visit (INDEPENDENT_AMBULATORY_CARE_PROVIDER_SITE_OTHER): Payer: 59 | Admitting: *Deleted

## 2019-03-04 ENCOUNTER — Telehealth: Payer: Self-pay | Admitting: *Deleted

## 2019-03-04 VITALS — BP 125/76 | HR 68 | Temp 98.0°F | Ht 78.0 in | Wt 372.8 lb

## 2019-03-04 DIAGNOSIS — J309 Allergic rhinitis, unspecified: Secondary | ICD-10-CM | POA: Diagnosis not present

## 2019-03-04 DIAGNOSIS — G43009 Migraine without aura, not intractable, without status migrainosus: Secondary | ICD-10-CM | POA: Diagnosis not present

## 2019-03-04 NOTE — Telephone Encounter (Signed)
Called pt to offer earlier appt with SS/NP.  He stated that he would like that, offered 03-04-19, he needed to come in late afternoon. Made appt at 1515 and then he asked if could come in vs VV. ( I stated preferable VV for safety, but he would like to come in).  Made IN OFFICE VISIT.

## 2019-03-04 NOTE — Progress Notes (Addendum)
PATIENT: Bradley [name] DOB: 1996/07/28  REASON FOR VISIT: follow up HISTORY FROM: patient  HISTORY OF PRESENT ILLNESS: Today 03/04/19  Bradley [name] is a 23 year old male with history of headache.  He had an abnormal MRI of the brain in February 2019 showing an area of enhancement in the left parasagittal frontal lobe.  A repeat MRI in July 2019 showed there is no longer any enhancement in this area.  A small single focus of T2 signal abnormality is seen in the right parietal lobe and is nonspecific. He indicates that he is doing well.  He is currently taking propanolol 20 mg twice daily for his headaches.  He was able to decrease his dose after improvement of his headaches.  He indicates that he may have 1 headache per week if that.  When he gets a headache he will take Tylenol for pain.  At times he may have a sharp pain in his head that may come and go in a matter of seconds.  He does report that he has noise sensitivity, loud noises sound very loud.  He thinks this could be due to shooting guns, not using proper ear protection.  He does not have any new problems or concerns. At his last visit in October 2019, he reports Dr. Jannifer [name] told him no need to repeat MRI of the brain, since the area of concern had cleared.  HISTORY  07/05/2018 Dr. Jannifer [name]: Bradley [name] is a 23 year old right-handed white male with a history of headache.  The patient had an abnormal MRI of the brain done in February 2019 showing an area of enhancement in the left parasagittal frontal lobe.  A repeat MRI done in July 2019 showed that there was no longer any enhancement in this area.  A small single focus of T2 signal abnormality is seen in the right parietal lobe and is nonspecific.  The patient has done very well with his headaches until just recently.  The patient indicates that he has had no headaches whatsoever until 7 days prior to this visit.  The patient began having daily headaches at that point, he was given a 3-day  course of Decadron which seems to have significantly improved his pain.  The patient indicates that he has been diagnosed with a low testosterone level, he has been trying to lose weight but has been unsuccessful so far.  The patient is considering going on testosterone supplementation.  The patient also reports intermittent left ear pain.  The patient returns to this office for an evaluation   REVIEW OF SYSTEMS: Out of a complete 14 system review of symptoms, the patient complains only of the following symptoms, and all other reviewed systems are negative.  Headache  ALLERGIES: Allergies  Allergen Reactions   Shellfish Allergy Anaphylaxis   Hydrocodone Itching    Pt states he feels itchy after taking, but it doesn't preclude him taking hydrocodone if needed    HOME MEDICATIONS: Outpatient Medications Prior to Visit  Medication Sig Dispense Refill   acetaminophen (TYLENOL) 500 MG tablet Take 1,000 mg by mouth every 6 (six) hours as needed.     AMBULATORY NON FORMULARY MEDICATION Inhaler. Patient said he re rarely uses it. Was told to bring to upcoming procedure to be added on     levocetirizine (XYZAL) 5 MG tablet Take 5 mg by mouth every evening.     ondansetron (ZOFRAN) 8 MG tablet 8 mg as needed.      Probiotic Product ([place])  CAPS Take 1 capsule by mouth daily.     propranolol (INDERAL) 20 MG tablet Take 1 tablet (20 mg total) by mouth 2 (two) times daily. 60 tablet 3   sulfamethoxazole-trimethoprim (BACTRIM DS) 800-160 MG tablet Take 1 tablet by mouth 2 (two) times daily.     EPINEPHrine (AUVI-Q) 0.3 mg/0.3 mL IJ SOAJ injection Use as directed for severe allergic reaction (Patient not taking: Reported on 03/04/2019) 2 Device 1   hyoscyamine (LEVSIN SL) 0.125 MG SL tablet Place 1 tablet (0.125 mg total) under the tongue every 6 (six) hours as needed. (Patient not taking: Reported on 03/04/2019) 30 tablet 0   Sod Picosulfate-Mag Ox-Cit Acd (CLENPIQ) 10-3.5-12  MG-GM -GM/160ML SOLN Take 1 kit by mouth as directed. (Patient not taking: Reported on 03/04/2019) 2 Bottle 0   No facility-administered medications prior to visit.     PAST MEDICAL HISTORY: Past Medical History:  Diagnosis Date   Anxiety    Chronic headaches    Clavicular fracture    History of chickenpox    Mild intermittent asthma 06/07/2018   Shingles    23 years old.    PAST SURGICAL HISTORY: Past Surgical History:  Procedure Laterality Date   CYSTECTOMY  01/2019   scrotum area    WISDOM TOOTH EXTRACTION      FAMILY HISTORY: Family History  Problem Relation Age of Onset   Hypertension Mother    Colon polyps Mother        might be mistakrn   Diabetes Father        type II   Migraines Sister    Colon cancer Neg Hx    Esophageal cancer Neg Hx     SOCIAL HISTORY: Social History   Socioeconomic History   Marital status: Single    Spouse name: Not on file   Number of children: 0   Years of education: College   Highest education level: Not on file  Occupational History   Occupation: [employer]   Financial resource strain: Not on file   Food insecurity:    Worry: Not on file    Inability: Not on file   Transportation needs:    Medical: Not on file    Non-medical: Not on file  Tobacco Use   Smoking status: Never Smoker   Smokeless tobacco: Never Used  Substance and Sexual Activity   Alcohol use: Yes    Frequency: Never    Comment: Seldom   Drug use: Not Currently    Types: Marijuana    Comment: rare use   Sexual activity: Yes    Partners: Female  Lifestyle   Physical activity:    Days per week: Not on file    Minutes per session: Not on file   Stress: Not on file  Relationships   Social connections:    Talks on phone: Not on file    Gets together: Not on file    Attends religious service: Not on file    Active member of club or organization: Not on file    Attends meetings of clubs or organizations:  Not on file    Relationship status: Not on file   Intimate partner violence:    Fear of current or ex partner: Not on file    Emotionally abused: Not on file    Physically abused: Not on file    Forced sexual activity: Not on file  Other Topics Concern   Not on file  Social History Narrative  Lives alone   Caffeine use: 1 cup coffee per day   Right handed       PHYSICAL EXAM  Vitals:   03/04/19 1501  BP: 125/76  Pulse: 68  Temp: 98 F (36.7 C)  Weight: (!) 372 lb 12.8 oz (169.1 kg)  Height: 6' 6"  (1.981 m)   Body mass index is 43.08 kg/m.  Generalized: Well developed, in no acute distress   Neurological examination  Mentation: Alert oriented to time, place, history taking. Follows all commands speech and language fluent Cranial nerve II-XII: Pupils were equal round reactive to light. Extraocular movements were full, visual field were full on confrontational test. Facial sensation and strength were normal. Uvula tongue midline. Head turning and shoulder shrug  were normal and symmetric. Motor: The motor testing reveals 5 over 5 strength of all 4 extremities. Good symmetric motor tone is noted throughout.  Sensory: Sensory testing is intact to soft touch on all 4 extremities. No evidence of extinction is noted.  Coordination: Cerebellar testing reveals good finger-nose-finger and heel-to-shin bilaterally.  Gait and station: Gait is normal. Tandem gait is normal.  Reflexes: Deep tendon reflexes are symmetric and normal bilaterally.   DIAGNOSTIC DATA (LABS, IMAGING, TESTING) - I reviewed patient records, labs, notes, testing and imaging myself where available.  Lab Results  Component Value Date   WBC 5.8 02/26/2019   HGB 14.9 02/26/2019   HCT 43.4 02/26/2019   MCV 88.7 02/26/2019   PLT 201.0 02/26/2019      Component Value Date/Time   NA 135 02/26/2019 1120   K 4.3 02/26/2019 1120   CL 103 02/26/2019 1120   CO2 24 02/26/2019 1120   GLUCOSE 83 02/26/2019 1120     BUN 12 02/26/2019 1120   CREATININE 0.80 02/26/2019 1120   CALCIUM 9.2 02/26/2019 1120   PROT 6.7 02/26/2019 1120   ALBUMIN 4.4 02/26/2019 1120   AST 15 02/26/2019 1120   ALT 24 02/26/2019 1120   ALKPHOS 59 02/26/2019 1120   BILITOT 0.6 02/26/2019 1120   GFRNONAA >60 12/08/2017 1423   GFRAA >60 12/08/2017 1423   No results found for: CHOL, HDL, LDLCALC, LDLDIRECT, TRIG, CHOLHDL Lab Results  Component Value Date   HGBA1C 4.9 07/01/2018   No results found for: VITAMINB12 Lab Results  Component Value Date   TSH 2.45 10/01/2018      ASSESSMENT AND PLAN 23 y.o. year old male  has a past medical history of Anxiety, Chronic headaches, Clavicular fracture, History of chickenpox, Mild intermittent asthma (06/07/2018), and Shingles. here with:  1.  Headache  Overall he indicates his headaches are under good control.  He reports he may have 1 headache per week if that.  He will continue taking propanolol 20 mg twice daily.  He does not wish to make any dose adjustments or medication changes.  We discussed we could try Topamax.  He was not fond of the increased propanolol 40 mg twice a day, he said he felt like his pulse was too low.  At this time, we will not repeat the MRI of the brain, as the last MRI in July 2019 did not show the prior left frontal enhancing lesion. He reports he and Dr. Jannifer [name] discussed and decided not to repeat at their last appointment.   Addendum 03/05/2019 SS: I spoke with Dr. Jannifer [name], since patient is not having any new symptoms and his headaches are under good control. We will not repeat the MRI of the brain at this time.  I spent 15 minutes with the patient. 50% of this time was spent discussing his plan of care.    Butler Denmark, AGNP-C, DNP 03/04/2019, 4:28 PM [facility] [address], [place], [place] [address] [phone]

## 2019-03-04 NOTE — Progress Notes (Signed)
I have read the note, and I agree with the clinical assessment and plan.  Charles K Willis   

## 2019-03-05 ENCOUNTER — Encounter: Payer: Self-pay | Admitting: Physician Assistant

## 2019-03-07 ENCOUNTER — Other Ambulatory Visit: Payer: Self-pay

## 2019-03-07 ENCOUNTER — Telehealth: Payer: Self-pay | Admitting: Gastroenterology

## 2019-03-07 ENCOUNTER — Encounter: Payer: Self-pay | Admitting: *Deleted

## 2019-03-07 NOTE — Telephone Encounter (Signed)
Sent patient instruction via MyChart (per his request) for a MiraLAX purge.

## 2019-03-07 NOTE — Telephone Encounter (Signed)
Spoke to the patient who reports several days of constipation. The patient report stooling very little, hard amounts and wants relief. His PCP recommended a stool softener, that he has been taking for a few days with no relief. He wants to know if he can take a laxative even though he is scheduled for an ENDO COLON on 6/16 with Dr. Bryan Lemma. Please advise.

## 2019-03-07 NOTE — Telephone Encounter (Signed)
Patient called and wanted to speak with the nurse. He Is having a procedure Tuesday coming up and wants to know if he can take a laxative today because he is stopped up? Please call and advise thanks.

## 2019-03-07 NOTE — Telephone Encounter (Signed)
Yes, absolutely. Thanks!

## 2019-03-10 ENCOUNTER — Telehealth: Payer: Self-pay | Admitting: *Deleted

## 2019-03-10 NOTE — Telephone Encounter (Signed)
Covid-19 screening questions   Do you now or have you had a fever in the last 14 days?no  Do you have any respiratory symptoms of shortness of breath or cough now or in the last 14 days?no  Do you have any family members or close contacts with diagnosed or suspected Covid-19 in the past 14 days?no  Have you been tested for Covid-19 and found to be positive?no  PT aware of care partner policy and will bring a mask with him. SM       

## 2019-03-11 ENCOUNTER — Encounter: Payer: Self-pay | Admitting: Gastroenterology

## 2019-03-11 ENCOUNTER — Ambulatory Visit (AMBULATORY_SURGERY_CENTER): Payer: 59 | Admitting: Gastroenterology

## 2019-03-11 ENCOUNTER — Other Ambulatory Visit: Payer: Self-pay

## 2019-03-11 VITALS — BP 148/88 | HR 47 | Temp 98.4°F | Resp 10 | Ht 78.0 in | Wt 372.0 lb

## 2019-03-11 DIAGNOSIS — D128 Benign neoplasm of rectum: Secondary | ICD-10-CM | POA: Diagnosis not present

## 2019-03-11 DIAGNOSIS — R195 Other fecal abnormalities: Secondary | ICD-10-CM

## 2019-03-11 DIAGNOSIS — K2970 Gastritis, unspecified, without bleeding: Secondary | ICD-10-CM

## 2019-03-11 DIAGNOSIS — R1084 Generalized abdominal pain: Secondary | ICD-10-CM

## 2019-03-11 DIAGNOSIS — D125 Benign neoplasm of sigmoid colon: Secondary | ICD-10-CM

## 2019-03-11 DIAGNOSIS — D120 Benign neoplasm of cecum: Secondary | ICD-10-CM

## 2019-03-11 MED ORDER — SODIUM CHLORIDE 0.9 % IV SOLN: 500.0000 mL | Freq: Once | INTRAVENOUS | Status: DC

## 2019-03-11 NOTE — Patient Instructions (Signed)
Thank you for allowing [place] to care for you today!  Await pathology results by mail, approximately 2 weeks.  Resume previous diet and medications today.  Return to your normal activities tomorrow.  Repeat colonoscopy in 3-5 years for surveillance of polyps.  Recommend fiber such as Citrucel, Fibercon, Konsyl, and Metamucil.  Follow up with Dr Bryan Lemma, will call to set up appointment.  YOU HAD AN ENDOSCOPIC PROCEDURE TODAY AT [facility]:   Refer to the procedure report that was given to you for any specific questions about what was found during the examination.  If the procedure report does not answer your questions, please call your gastroenterologist to clarify.  If you requested that your care partner not be given the details of your procedure findings, then the procedure report has been included in a sealed envelope for you to review at your convenience later.  YOU SHOULD EXPECT: Some feelings of bloating in the abdomen. Passage of more gas than usual.  Walking can help get rid of the air that was put into your GI tract during the procedure and reduce the bloating. If you had a lower endoscopy (such as a colonoscopy or flexible sigmoidoscopy) you may notice spotting of blood in your stool or on the toilet paper. If you underwent a bowel prep for your procedure, you may not have a normal bowel movement for a few days.  Please Note:  You might notice some irritation and congestion in your nose or some drainage.  This is from the oxygen used during your procedure.  There is no need for concern and it should clear up in a day or so.  SYMPTOMS TO REPORT IMMEDIATELY:   Following lower endoscopy (colonoscopy or flexible sigmoidoscopy):  Excessive amounts of blood in the stool  Significant tenderness or worsening of abdominal pains  Swelling of the abdomen that is new, acute  Fever of 100F or higher   Following upper endoscopy (EGD)  Vomiting of blood or coffee ground  material  New chest pain or pain under the shoulder blades  Painful or persistently difficult swallowing  New shortness of breath  Fever of 100F or higher  Black, tarry-looking stools  For urgent or emergent issues, a gastroenterologist can be reached at any hour by calling [phone].   DIET:  We do recommend a small meal at first, but then you may proceed to your regular diet.  Drink plenty of fluids but you should avoid alcoholic beverages for 24 hours.  ACTIVITY:  You should plan to take it easy for the rest of today and you should NOT DRIVE or use heavy machinery until tomorrow (because of the sedation medicines used during the test).    FOLLOW UP: Our staff will call the number listed on your records 48-72 hours following your procedure to check on you and address any questions or concerns that you may have regarding the information given to you following your procedure. If we do not reach you, we will leave a message.  We will attempt to reach you two times.  During this call, we will ask if you have developed any symptoms of COVID 19. If you develop any symptoms (ie: fever, flu-like symptoms, shortness of breath, cough etc.) before then, please call [phone].  If you test positive for Covid 19 in the 2 weeks post procedure, please call and report this information to [place].    If any biopsies were taken you will be contacted by phone or by letter  within the next 1-3 weeks.  Please call us at [phone] if you have not heard about the biopsies in 3 weeks.    SIGNATURES/CONFIDENTIALITY: You and/or your care partner have signed paperwork which will be entered into your electronic medical record.  These signatures attest to the fact that that the information above on your After Visit Summary has been reviewed and is understood.  Full responsibility of the confidentiality of this discharge information lies with you and/or your care-partner.

## 2019-03-11 NOTE — Progress Notes (Signed)
Report given to PACU, vss 

## 2019-03-11 NOTE — Progress Notes (Signed)
Pt's states no medical or surgical changes since previsit or office visit. 

## 2019-03-11 NOTE — Op Note (Signed)
[facility] Patient Name: Bradley [name] Procedure Date: 03/11/2019 3:57 PM MRN: [id number] Endoscopist: Gerrit Heck , MD Age: 23 Referring MD:  Date of Birth: 1995/12/05 Gender: Male Account #: [id number] Procedure:                Upper GI endoscopy Indications:              Generalized abdominal pain, Diarrhea Medicines:                Monitored Anesthesia Care Procedure:                Pre-Anesthesia Assessment:                           - Prior to the procedure, a History and Physical                            was performed, and patient medications and                            allergies were reviewed. The patient's tolerance of                            previous anesthesia was also reviewed. The risks                            and benefits of the procedure and the sedation                            options and risks were discussed with the patient.                            All questions were answered, and informed consent                            was obtained. Prior Anticoagulants: The patient has                            taken no previous anticoagulant or antiplatelet                            agents. ASA Grade Assessment: II - A patient with                            mild systemic disease. After reviewing the risks                            and benefits, the patient was deemed in                            satisfactory condition to undergo the procedure.                           After obtaining informed consent, the endoscope was  passed under direct vision. Throughout the                            procedure, the patient's blood pressure, pulse, and                            oxygen saturations were monitored continuously. The                            Model GIF-[id number]) scope was introduced                            through the mouth, and advanced to the second part                            of duodenum. The  upper GI endoscopy was                            accomplished without difficulty. The patient                            tolerated the procedure well. Scope In: Scope Out: Findings:                 The examined esophagus was normal.                           The Z-line was regular and was found 42 cm from the                            incisors.                           Localized minimal inflammation characterized by                            erythema was found in the gastric antrum. Biopsies                            were taken with a cold forceps for histology.                            Estimated blood loss was minimal.                           The cardia, gastric fundus, gastric body and                            incisura were normal. Biopsies were taken with a                            cold forceps for Helicobacter pylori testing.                            Estimated blood loss was minimal.  The duodenal bulb, first portion of the duodenum                            and second portion of the duodenum were normal.                            Biopsies for histology were taken with a cold                            forceps for evaluation of celiac disease. Estimated                            blood loss was minimal. Complications:            No immediate complications. Estimated Blood Loss:     Estimated blood loss was minimal. Impression:               - Normal esophagus.                           - Z-line regular, 42 cm from the incisors.                           - Gastritis. Biopsied.                           - Normal cardia, gastric fundus, gastric body and                            incisura. Biopsied.                           - Normal duodenal bulb, first portion of the                            duodenum and second portion of the duodenum.                            Biopsied. Recommendation:           - Patient has a contact number available  for                            emergencies. The signs and symptoms of potential                            delayed complications were discussed with the                            patient. Return to normal activities tomorrow.                            Written discharge instructions were provided to the                            patient.                           -  Resume previous diet.                           - Continue present medications.                           - Await pathology results.                           - Return to GI clinic at appointment to be                            scheduled. Gerrit Heck, MD 03/11/2019 4:48:12 PM

## 2019-03-11 NOTE — Op Note (Signed)
[place] Patient Name: Bradley [name] Procedure Date: 03/11/2019 3:56 PM MRN: [id number] Endoscopist: Gerrit Heck , MD Age: 23 Referring MD:  Date of Birth: October 31, 1995 Gender: Male Account #: [id number] Procedure:                Colonoscopy Indications:              Generalized abdominal pain, Diarrhea, Change in                            bowel habits Medicines:                Monitored Anesthesia Care Procedure:                Pre-Anesthesia Assessment:                           - Prior to the procedure, a History and Physical                            was performed, and patient medications and                            allergies were reviewed. The patient's tolerance of                            previous anesthesia was also reviewed. The risks                            and benefits of the procedure and the sedation                            options and risks were discussed with the patient.                            All questions were answered, and informed consent                            was obtained. Prior Anticoagulants: The patient has                            taken no previous anticoagulant or antiplatelet                            agents. ASA Grade Assessment: II - A patient with                            mild systemic disease. After reviewing the risks                            and benefits, the patient was deemed in                            satisfactory condition to undergo the procedure.  After obtaining informed consent, the colonoscope                            was passed under direct vision. Throughout the                            procedure, the patient's blood pressure, pulse, and                            oxygen saturations were monitored continuously. The                            Colonoscope was introduced through the anus and                            advanced to the the terminal ileum. The colonoscopy                             was performed without difficulty. The patient                            tolerated the procedure well. The quality of the                            bowel preparation was adequate. The terminal ileum,                            ileocecal valve, appendiceal orifice, and rectum                            were photographed. Scope In: 4:26:02 PM Scope Out: 4:43:17 PM Scope Withdrawal Time: 0 hours 13 minutes 57 seconds  Total Procedure Duration: 0 hours 17 minutes 15 seconds  Findings:                 The perianal and digital rectal examinations were                            normal.                           A 12 mm polyp was found in the cecum. The polyp was                            sessile. The polyp was removed with a cold snare.                            Resection and retrieval were complete. Cold forceps                            were then used on the edges of the polypectomy site                            for avulsion technique. Estimated blood loss was  minimal.                           A 4 mm polyp was found in the sigmoid colon. The                            polyp was sessile. The polyp was removed with a                            cold snare. Resection and retrieval were complete.                            Estimated blood loss was minimal.                           The rectum, descending colon, transverse colon,                            hepatic flexure and ascending colon all otherwise                            appeared normal. No areas of mucosal erythema,                            edema, erosions, or ulceration noted. Biopsies for                            histology were taken with a cold forceps from the                            right colon and left colon for evaluation of                            microscopic colitis. Estimated blood loss was                            minimal.                            Retroflexion in the right colon was performed.                           The retroflexed view of the distal rectum and anal                            verge was normal and showed no anal or rectal                            abnormalities.                           The terminal ileum appeared normal. Complications:            No immediate complications. Estimated Blood Loss:     Estimated blood loss was minimal. Impression:               -  One 12 mm polyp in the cecum, removed with a cold                            snare. Resected and retrieved. Biopsied.                           - One 4 mm polyp in the sigmoid colon, removed with                            a cold snare. Resected and retrieved.                           - The rectum, descending colon, transverse colon,                            hepatic flexure and ascending colon are normal.                            Biopsied.                           - The distal rectum and anal verge are normal on                            retroflexion view.                           - The examined portion of the ileum was normal. Recommendation:           - Patient has a contact number available for                            emergencies. The signs and symptoms of potential                            delayed complications were discussed with the                            patient. Return to normal activities tomorrow.                            Written discharge instructions were provided to the                            patient.                           - Resume previous diet.                           - Continue present medications.                           - Await pathology results.                           -  Repeat colonoscopy in 3 - 5 years for                            surveillance based on pathology results.                           - Return to GI clinic at appointment to be                            scheduled.                            - Use fiber, for example Citrucel, Fibercon, Konsyl                            or Metamucil. Gerrit Heck, MD 03/11/2019 4:52:49 PM

## 2019-03-11 NOTE — Progress Notes (Signed)
Riki Sheer took temp and Rica Mote took vitals.

## 2019-03-11 NOTE — Progress Notes (Signed)
Called to room to assist during endoscopic procedure.  Patient ID and intended procedure confirmed with present staff. Received instructions for my participation in the procedure from the performing physician.  

## 2019-03-12 ENCOUNTER — Encounter: Payer: Self-pay | Admitting: Family Medicine

## 2019-03-12 ENCOUNTER — Telehealth: Payer: Self-pay | Admitting: Gastroenterology

## 2019-03-12 NOTE — Telephone Encounter (Signed)
The patient has been notified of this information and all questions answered. The pt has been advised of the information and verbalized understanding.    

## 2019-03-12 NOTE — Telephone Encounter (Signed)
Can trial some OTC Gas-X or Beano with observation. EGD/Colo otherwise largely unrevealing, and no polypectomy performed. Can f/u with me in clinic if sxs persist. Thanks.

## 2019-03-12 NOTE — Telephone Encounter (Signed)
The pt called with some abd cramping under the belly button to the left side.  Started this morning with some nausea.  Had colon and endo yesterday.  No rectal bleeding, no fever.  BM this morning was normal, not sure if this relieved the symptoms.  Please advise.

## 2019-03-13 ENCOUNTER — Telehealth: Payer: Self-pay | Admitting: *Deleted

## 2019-03-13 ENCOUNTER — Encounter: Payer: Self-pay | Admitting: Physician Assistant

## 2019-03-13 ENCOUNTER — Ambulatory Visit: Payer: 59 | Admitting: Adult Health

## 2019-03-13 ENCOUNTER — Ambulatory Visit: Payer: 59 | Admitting: Neurology

## 2019-03-13 NOTE — Telephone Encounter (Signed)
  Follow up Call-  Call back number 03/11/2019  Post procedure Call Back phone  # [phone]  Permission to leave phone message Yes  Some recent data might be hidden     Patient questions:  Do you have a fever, pain , or abdominal swelling? No. Pain Score  0 *  Have you tolerated food without any problems? Yes.    Have you been able to return to your normal activities? Yes.    Do you have any questions about your discharge instructions: Diet   No. Medications  No. Follow up visit  No.  Do you have questions or concerns about your Care? No.  Actions: * If pain score is 4 or above: No action needed, pain <4.  1. Have you developed a fever since your procedure? NO  2.   Have you had an respiratory symptoms (SOB or cough) since your procedure? NO  3.   Have you tested positive for COVID 19 since your procedure NO  4.   Have you had any family members/close contacts diagnosed with the COVID 19 since your procedure?  NO   If yes to any of these questions please route to Joylene John, RN and Alphonsa Gin, RN.

## 2019-03-14 ENCOUNTER — Encounter: Payer: Self-pay | Admitting: Physician Assistant

## 2019-03-17 ENCOUNTER — Encounter: Payer: Self-pay | Admitting: Neurology

## 2019-03-17 ENCOUNTER — Encounter: Payer: Self-pay | Admitting: Physician Assistant

## 2019-03-17 ENCOUNTER — Other Ambulatory Visit: Payer: Self-pay

## 2019-03-17 ENCOUNTER — Ambulatory Visit: Payer: 59 | Admitting: Physician Assistant

## 2019-03-17 ENCOUNTER — Telehealth: Payer: Self-pay | Admitting: Neurology

## 2019-03-17 VITALS — BP 118/80 | HR 86 | Temp 97.9°F | Resp 16 | Ht 78.0 in | Wt 368.0 lb

## 2019-03-17 DIAGNOSIS — S46811A Strain of other muscles, fascia and tendons at shoulder and upper arm level, right arm, initial encounter: Secondary | ICD-10-CM

## 2019-03-17 MED ORDER — CYCLOBENZAPRINE HCL 10 MG PO TABS: 10.0000 mg | ORAL_TABLET | Freq: Three times a day (TID) | ORAL | 0 refills | Status: DC | PRN

## 2019-03-17 NOTE — Progress Notes (Signed)
Patient presents to clinic today c/o 10 days of cervicalgia. Patient endorses that last saturday wokeup with back of neck pain. Endorses pain with movement. Top of neck down to right shoulder. Notes pain is improving. Isolated to right side of neck. 3/10 currently. Has taken both Aleve and tylenol, slight improvement. Unable to recall any mechanism of injury, denies recent trauma or MVA. Denies having anything like this before. Denies HA, visual changes, blurry vision, photophobia, nausea, vomiting, numbness or tingling, change in grip strength, tinnitus   Past Medical History:  Diagnosis Date  . Anxiety   . Chronic headaches   . Clavicular fracture   . History of chickenpox   . Mild intermittent asthma 06/07/2018  . Shingles    23 years old.    Current Outpatient Medications on File Prior to Visit  Medication Sig Dispense Refill  . acetaminophen (TYLENOL) 500 MG tablet Take 1,000 mg by mouth every 6 (six) hours as needed.    . AMBULATORY NON FORMULARY MEDICATION Inhaler. Patient said he re rarely uses it. Was told to bring to upcoming procedure to be added on    . EPINEPHrine (AUVI-Q) 0.3 mg/0.3 mL IJ SOAJ injection Use as directed for severe allergic reaction 2 Device 1  . levocetirizine (XYZAL) 5 MG tablet Take 5 mg by mouth every evening.    . ondansetron (ZOFRAN) 8 MG tablet 8 mg as needed.     . Probiotic Product ([place]) CAPS Take 1 capsule by mouth daily.    . propranolol (INDERAL) 20 MG tablet Take 1 tablet (20 mg total) by mouth 2 (two) times daily. 60 tablet 3   No current facility-administered medications on file prior to visit.     Allergies  Allergen Reactions  . Shellfish Allergy Anaphylaxis  . Hydrocodone Itching    Pt states he feels itchy after taking, but it doesn't preclude him taking hydrocodone if needed    Family History  Problem Relation Age of Onset  . Hypertension Mother   . Colon polyps Mother        might be mistakrn  . Diabetes  Father        type II  . Migraines Sister   . Colon cancer Neg Hx   . Esophageal cancer Neg Hx   . Rectal cancer Neg Hx   . Stomach cancer Neg Hx     Social History   Socioeconomic History  . Marital status: Single    Spouse name: Not on file  . Number of children: 0  . Years of education: College  . Highest education level: Not on file  Occupational History  . Occupation: [employer]  . Financial resource strain: Not on file  . Food insecurity    Worry: Not on file    Inability: Not on file  . Transportation needs    Medical: Not on file    Non-medical: Not on file  Tobacco Use  . Smoking status: Never Smoker  . Smokeless tobacco: Never Used  Substance and Sexual Activity  . Alcohol use: Yes    Frequency: Never    Comment: Seldom  . Drug use: Not Currently    Types: Marijuana    Comment: rare use  . Sexual activity: Yes    Partners: Female  Lifestyle  . Physical activity    Days per week: Not on file    Minutes per session: Not on file  . Stress: Not on file  Relationships  .  Social Herbalist on phone: Not on file    Gets together: Not on file    Attends religious service: Not on file    Active member of club or organization: Not on file    Attends meetings of clubs or organizations: Not on file    Relationship status: Not on file  Other Topics Concern  . Not on file  Social History Narrative   Lives alone   Caffeine use: 1 cup coffee per day   Right handed    Review of Systems - See HPI.  All other ROS are negative.  BP 118/80   Pulse 86   Temp 97.9 F (36.6 C) (Skin)   Resp 16   Ht _0  (1.981 m)   Wt (!) 368 lb (166.9 kg)   SpO2 98%   BMI 42.53 kg/m   Physical Exam Vitals signs reviewed.  Constitutional:      Appearance: Normal appearance.  HENT:     Head: Normocephalic and atraumatic.  Neck:     Musculoskeletal: Neck supple.  Cardiovascular:     Rate and Rhythm: Normal rate and regular rhythm.      Pulses: Normal pulses.  Pulmonary:     Effort: Pulmonary effort is normal.     Breath sounds: Normal breath sounds.  Musculoskeletal:     Cervical back: He exhibits tenderness and spasm. He exhibits normal range of motion and no bony tenderness.  Neurological:     General: No focal deficit present.     Mental Status: He is alert and oriented to person, place, and time.  Psychiatric:        Mood and Affect: Mood normal.     Recent Results (from the past 2160 hour(s))  Allergen Profile, Shellfish     Status: Abnormal   Collection Time: 02/07/19  9:59 AM  Result Value Ref Range   Class Description Comment     Comment:     Levels of Specific IgE       Class  Description of Class     ---------------------------  -----  --------------------                    < 0.10         0         Negative            0.10 -    0.31         0/I       Equivocal/Low            0.32 -    0.55         I         Low            0.56 -    1.40         II        Moderate            1.41 -    3.90         III       High            3.91 -   19.00         IV        Very High           19.01 -  100.00         V  Very High                   >100.00         VI        Very High    Clam IgE 0.47 (A) Class I kU/L   F023-IgE Crab 0.20 (A) Class 0/I kU/L   Shrimp IgE 1.25 (A) Class II kU/L   Scallop IgE 0.39 (A) Class I kU/L   F290-IgE Oyster <0.10 Class 0 kU/L   F080-IgE Lobster <0.10 Class 0 kU/L  GC/Chlamydia probe amp     Status: None   Collection Time: 02/13/19 12:00 AM  Result Value Ref Range   Chlamydia Negative     Comment: Normal Reference Range - Negative   Neisseria gonorrhea Negative     Comment: Normal Reference Range - Negative  Urinalysis, Routine w reflex microscopic     Status: Abnormal   Collection Time: 02/13/19  3:39 PM  Result Value Ref Range   Color, Urine YELLOW YELLOW   APPearance CLEAR CLEAR   Specific Gravity, Urine >1.030 (H) 1.005 - 1.030   pH 6.0 5.0 - 8.0   Glucose, UA  NEGATIVE NEGATIVE mg/dL   Hgb urine dipstick NEGATIVE NEGATIVE   Bilirubin Urine NEGATIVE NEGATIVE   Ketones, ur NEGATIVE NEGATIVE mg/dL   Protein, ur NEGATIVE NEGATIVE mg/dL   Nitrite NEGATIVE NEGATIVE   Leukocytes,Ua NEGATIVE NEGATIVE    Comment: Microscopic not done on urines with negative protein, blood, leukocytes, nitrite, or glucose < 500 mg/dL. Performed at [facility], [address] [place], [place] [address]   CBC w/Diff     Status: None   Collection Time: 02/26/19 11:20 AM  Result Value Ref Range   WBC 5.8 4.0 - 10.5 K/uL   RBC 4.90 4.22 - 5.81 Mil/uL   Hemoglobin 14.9 13.0 - 17.0 g/dL   HCT 43.4 39.0 - 52.0 %   MCV 88.7 78.0 - 100.0 fl   MCHC 34.2 30.0 - 36.0 g/dL   RDW 12.8 11.5 - 15.5 %   Platelets 201.0 150.0 - 400.0 K/uL   Neutrophils Relative % 61.3 43.0 - 77.0 %   Lymphocytes Relative 29.5 12.0 - 46.0 %   Monocytes Relative 7.1 3.0 - 12.0 %   Eosinophils Relative 1.2 0.0 - 5.0 %   Basophils Relative 0.9 0.0 - 3.0 %   Neutro Abs 3.5 1.4 - 7.7 K/uL   Lymphs Abs 1.7 0.7 - 4.0 K/uL   Monocytes Absolute 0.4 0.1 - 1.0 K/uL   Eosinophils Absolute 0.1 0.0 - 0.7 K/uL   Basophils Absolute 0.1 0.0 - 0.1 K/uL  Comp Met (CMET)     Status: None   Collection Time: 02/26/19 11:20 AM  Result Value Ref Range   Sodium 135 135 - 145 mEq/L   Potassium 4.3 3.5 - 5.1 mEq/L   Chloride 103 96 - 112 mEq/L   CO2 24 19 - 32 mEq/L   Glucose, Bld 83 70 - 99 mg/dL   BUN 12 6 - 23 mg/dL   Creatinine, Ser 0.80 0.40 - 1.50 mg/dL   Total Bilirubin 0.6 0.2 - 1.2 mg/dL   Alkaline Phosphatase 59 39 - 117 U/L   AST 15 0 - 37 U/L   ALT 24 0 - 53 U/L   Total Protein 6.7 6.0 - 8.3 g/dL   Albumin 4.4 3.5 - 5.2 g/dL   Calcium 9.2 8.4 - 10.5 mg/dL   GFR 119.10 >60.00 mL/min  Assessment/Plan: 1. Strain of right trapezius muscle, initial encounter Improving over the past several days. Still some tension and pain with ROM of neck and upper back. No bony abnormality or  tenderness on exam. There is tension in bilateral trapezius muscles with R >L. Some R perispinous muscular tenderness noted. ROM not impaired. Start Flexeril at night. Continue OTC NSAID. Supportive measures and OTC medications reviewed. Follow-up if symptoms are not continuing to resolve.   - cyclobenzaprine (FLEXERIL) 10 MG tablet; Take 1 tablet (10 mg total) by mouth 3 (three) times daily as needed for muscle spasms.  Dispense: 30 tablet; Refill: 0   Leeanne Rio, PA-C

## 2019-03-17 NOTE — Patient Instructions (Addendum)
Please avoid heavy lifting or overexertion. Continue the over-the-counter antiinflammatory to help with tension and inflammation. Use the Flexeril at night. Can be used up to three times daily but no driving or operating heavy machinery after taking medication.  Let me know if symptoms do not continue to resolve.

## 2019-03-18 ENCOUNTER — Encounter: Payer: Self-pay | Admitting: Physician Assistant

## 2019-03-18 ENCOUNTER — Other Ambulatory Visit: Payer: Self-pay | Admitting: Physician Assistant

## 2019-03-18 MED ORDER — METHYLPREDNISOLONE 4 MG PO TBPK: ORAL_TABLET | ORAL | 0 refills | Status: DC

## 2019-03-19 ENCOUNTER — Encounter: Payer: Self-pay | Admitting: Gastroenterology

## 2019-03-20 ENCOUNTER — Encounter: Payer: Self-pay | Admitting: Neurology

## 2019-03-20 ENCOUNTER — Telehealth (INDEPENDENT_AMBULATORY_CARE_PROVIDER_SITE_OTHER): Payer: 59 | Admitting: Neurology

## 2019-03-20 DIAGNOSIS — G43009 Migraine without aura, not intractable, without status migrainosus: Secondary | ICD-10-CM | POA: Diagnosis not present

## 2019-03-20 MED ORDER — TOPIRAMATE 25 MG PO TABS: ORAL_TABLET | ORAL | 3 refills | Status: DC

## 2019-03-20 NOTE — Progress Notes (Signed)
Virtual Visit via Video Note  I connected with Bradley [name] on 03/20/19 at  8:45 AM EDT by a video enabled telemedicine application and verified that I am speaking with the correct person using two identifiers.  Location: Patient: At his home Provider: In the office    I discussed the limitations of evaluation and management by telemedicine and the availability of in person appointments. The patient expressed understanding and agreed to proceed.  History of Present Illness: 03/20/2019 SS: Bradley [name] is a 23 year old male with history of headaches.  He is currently on propanolol 20 mg twice a day.  He reports he is having on average 1 migraine per week.  The purpose of today's visit, he reports side effects of the propanolol.  He reports he does not feel like himself, has been worried that his heart rate may be getting too low.  Furthermore, he is concerned about mixing medication with alcohol. He is mostly interested in starting a CGRP medication for headache prevention.   03/04/2019 SS: Bradley [name] is a 23 year old male with history of headache.  He had an abnormal MRI of the brain in February 2019 showing an area of enhancement in the left parasagittal frontal lobe.  A repeat MRI in July 2019 showed there is no longer any enhancement in this area.  A small single focus of T2 signal abnormality is seen in the right parietal lobe and is nonspecific. He indicates that he is doing well.  He is currently taking propanolol 20 mg twice daily for his headaches.  He was able to decrease his dose after improvement of his headaches.  He indicates that he may have 1 headache per week if that.  When he gets a headache he will take Tylenol for pain.  At times he may have a sharp pain in his head that may come and go in a matter of seconds.  He does report that he has noise sensitivity, loud noises sound very loud.  He thinks this could be due to shooting guns, not using proper ear protection.  He does not  have any new problems or concerns. At his last visit in October 2019, he reports Dr. Jannifer Franklin told him no need to repeat MRI of the brain, since the area of concern had cleared.   Observations/Objective: Alert, answers questions appropriately, speech is clear and concise, facial symmetry noted  Assessment and Plan: 1.  Headache  He has seen benefit in his headache frequency with propanolol, however he reports side effects.  He wishes to try another medication, specifically he is interested in the CGRP injectable medications.  At this point, I have suggested he try Topamax working up to 75 mg at bedtime.  He will slowly wean off propanolol, taking 20 mg once daily for a week.  If he does not see benefit with Topamax, we will try Aimovig 140 mg monthly injection.  We need to try at least 2 oral medications before trying CGRP. He will call for dose adjustments as needed.   Follow Up Instructions: 09/03/2019 with Dr. Jannifer Franklin, he may have sooner f/u appointment if switch medications   I discussed the assessment and treatment plan with the patient. The patient was provided an opportunity to ask questions and all were answered. The patient agreed with the plan and demonstrated an understanding of the instructions.   The patient was advised to call back or seek an in-person evaluation if the symptoms worsen or if the condition fails to improve  as anticipated.  I provided 15 minutes of non-face-to-face time during this encounter.   Evangeline Dakin, DNP  [facility] [address], [place], [place] [address] [phone]

## 2019-03-20 NOTE — Progress Notes (Signed)
I have read the note, and I agree with the clinical assessment and plan.  Bradley [name] K Bradley [name]   

## 2019-03-26 ENCOUNTER — Ambulatory Visit (INDEPENDENT_AMBULATORY_CARE_PROVIDER_SITE_OTHER): Payer: 59 | Admitting: *Deleted

## 2019-03-26 ENCOUNTER — Telehealth: Payer: Self-pay | Admitting: Gastroenterology

## 2019-03-26 ENCOUNTER — Telehealth: Payer: Self-pay | Admitting: Neurology

## 2019-03-26 DIAGNOSIS — J309 Allergic rhinitis, unspecified: Secondary | ICD-10-CM

## 2019-03-26 MED ORDER — AIMOVIG 70 MG/ML ~~LOC~~ SOAJ: 70.0000 mg | SUBCUTANEOUS | 3 refills | Status: DC

## 2019-03-26 NOTE — Telephone Encounter (Signed)
Patient called wanting to discuss lab results from endo/colon

## 2019-03-26 NOTE — Telephone Encounter (Signed)
Pt called in and stated he doesn't like the way topiramate (TOPAMAX) 25 MG tablet makes him feel

## 2019-03-26 NOTE — Telephone Encounter (Signed)
The pt called for pathology results.  I have given him the information in the pathology letter.  Pt has no questions at this time.

## 2019-03-26 NOTE — Telephone Encounter (Signed)
He wants to try to [place]. I will place the order. I am not sure insurance will pay for it. Please confirm how many headaches he is having. At last visit, he said about 1 per week? I will go ahead and order. He has tried propranolol and topamax.

## 2019-03-26 NOTE — Telephone Encounter (Signed)
I called pt and he states that he started medication as day prescribed 03-20-19.    Has taken 6 days (now at 50mg  po qhs).  Has noted tiredness/ drowsiness.  He stated that he needed to try 2 drugs before getting injectable.  I told him that insurances like pt to try 4-6 wks, before going to some thing else, unless contraindicated or bad side effects.  He verbalized understanding.  I will forward to SS/NP and let her know. He stated no change with headaches right now.

## 2019-03-27 NOTE — Telephone Encounter (Signed)
Spoke to pt and relayed that aimovig ordered and did get request for PA.  Will attempt to get approval.  I did speak to him about access to savings card (on line) to try and get since he has [facility].  He verbalized understanding.  Headaches 5-6/month, migraines 1/month.

## 2019-03-27 NOTE — Telephone Encounter (Addendum)
Started Aimovig PA on CMM, key: [id number],  failed: topamax, propranolol side effects. He reported 1 migraine a week, 5-6 headaches a month. Clinical questions answered.  [employer] is reviewing your PA request. Typically an electronic response will be received within 72 hours. To check for an update later, open this request from your dashboard.

## 2019-04-01 NOTE — Telephone Encounter (Signed)
Per CMM: . Request Reference Number: [id number]. AIMOVIG INJ 70MG /ML is denied for not meeting the prior authorization requirement(s). For further questions, call [phone]. Please refer to the fax case notice for appeals information and instructions.

## 2019-04-02 ENCOUNTER — Encounter: Payer: Self-pay | Admitting: Neurology

## 2019-04-03 NOTE — Telephone Encounter (Addendum)
I resubmitted to [place] for episodic migraines. Less then 15 headaches per month and 4-14 migraines per month.  Tried topamax, and propranolol (SE).  Received approval thru 07/04/2019 PA# [id number].  [employer] [id number].   [phone].  Pt notified thru mychart.  Fax confirmation received [facility] [phone].

## 2019-04-08 ENCOUNTER — Encounter: Payer: Self-pay | Admitting: Physician Assistant

## 2019-04-11 NOTE — Telephone Encounter (Signed)
error 

## 2019-04-14 ENCOUNTER — Encounter: Payer: Self-pay | Admitting: Physician Assistant

## 2019-04-14 ENCOUNTER — Encounter: Payer: Self-pay | Admitting: Neurology

## 2019-04-16 ENCOUNTER — Ambulatory Visit (INDEPENDENT_AMBULATORY_CARE_PROVIDER_SITE_OTHER): Payer: 59 | Admitting: *Deleted

## 2019-04-16 DIAGNOSIS — J309 Allergic rhinitis, unspecified: Secondary | ICD-10-CM

## 2019-04-22 NOTE — Telephone Encounter (Signed)
Mailed copy of email messages as he did not read last one.

## 2019-04-24 ENCOUNTER — Encounter: Payer: Self-pay | Admitting: Neurology

## 2019-04-29 ENCOUNTER — Encounter: Payer: Self-pay | Admitting: Physician Assistant

## 2019-04-29 ENCOUNTER — Other Ambulatory Visit: Payer: Self-pay | Admitting: Neurology

## 2019-04-29 ENCOUNTER — Ambulatory Visit (INDEPENDENT_AMBULATORY_CARE_PROVIDER_SITE_OTHER): Payer: 59 | Admitting: *Deleted

## 2019-04-29 DIAGNOSIS — J309 Allergic rhinitis, unspecified: Secondary | ICD-10-CM

## 2019-05-07 ENCOUNTER — Emergency Department (HOSPITAL_BASED_OUTPATIENT_CLINIC_OR_DEPARTMENT_OTHER)
Admission: EM | Admit: 2019-05-07 | Discharge: 2019-05-07 | Disposition: A | Discharge status: Home / Self Care | Payer: 59 | Attending: Emergency Medicine | Admitting: Emergency Medicine

## 2019-05-07 ENCOUNTER — Emergency Department (HOSPITAL_BASED_OUTPATIENT_CLINIC_OR_DEPARTMENT_OTHER): Payer: 59

## 2019-05-07 ENCOUNTER — Encounter (HOSPITAL_BASED_OUTPATIENT_CLINIC_OR_DEPARTMENT_OTHER): Payer: Self-pay

## 2019-05-07 ENCOUNTER — Encounter: Payer: Self-pay | Admitting: Physician Assistant

## 2019-05-07 ENCOUNTER — Other Ambulatory Visit: Payer: Self-pay

## 2019-05-07 DIAGNOSIS — J45909 Unspecified asthma, uncomplicated: Secondary | ICD-10-CM | POA: Insufficient documentation

## 2019-05-07 DIAGNOSIS — R1033 Periumbilical pain: Secondary | ICD-10-CM | POA: Insufficient documentation

## 2019-05-07 DIAGNOSIS — R1011 Right upper quadrant pain: Secondary | ICD-10-CM | POA: Diagnosis not present

## 2019-05-07 DIAGNOSIS — R197 Diarrhea, unspecified: Secondary | ICD-10-CM | POA: Diagnosis not present

## 2019-05-07 DIAGNOSIS — R110 Nausea: Secondary | ICD-10-CM | POA: Insufficient documentation

## 2019-05-07 DIAGNOSIS — R109 Unspecified abdominal pain: Secondary | ICD-10-CM

## 2019-05-07 DIAGNOSIS — Z79899 Other long term (current) drug therapy: Secondary | ICD-10-CM | POA: Insufficient documentation

## 2019-05-07 DIAGNOSIS — K760 Fatty (change of) liver, not elsewhere classified: Secondary | ICD-10-CM | POA: Insufficient documentation

## 2019-05-07 LAB — URINALYSIS, ROUTINE W REFLEX MICROSCOPIC
Bilirubin Urine: NEGATIVE
Glucose, UA: NEGATIVE mg/dL
Hgb urine dipstick: NEGATIVE
Ketones, ur: 15 mg/dL — AB
Leukocytes,Ua: NEGATIVE
Nitrite: NEGATIVE
Protein, ur: NEGATIVE mg/dL
Specific Gravity, Urine: >1.03 — ABNORMAL HIGH (ref 1.005–1.030)
pH: 6 (ref 5.0–8.0)

## 2019-05-07 LAB — COMPREHENSIVE METABOLIC PANEL
ALT: 36 U/L (ref 0–44)
AST: 24 U/L (ref 15–41)
Albumin: 4.5 g/dL (ref 3.5–5.0)
Alkaline Phosphatase: 57 U/L (ref 38–126)
Anion gap: 12 (ref 5–15)
BUN: 15 mg/dL (ref 6–20)
CO2: 23 mmol/L (ref 22–32)
Calcium: 8.7 mg/dL — ABNORMAL LOW (ref 8.9–10.3)
Chloride: 100 mmol/L (ref 98–111)
Creatinine, Ser: 0.83 mg/dL (ref 0.61–1.24)
GFR calc Af Amer: >60 mL/min (ref >60)
GFR calc non Af Amer: >60 mL/min (ref >60)
Glucose, Bld: 90 mg/dL (ref 70–99)
Potassium: 3.9 mmol/L (ref 3.5–5.1)
Sodium: 135 mmol/L (ref 135–145)
Total Bilirubin: 0.9 mg/dL (ref 0.3–1.2)
Total Protein: 7.1 g/dL (ref 6.5–8.1)

## 2019-05-07 LAB — CBC WITH DIFFERENTIAL/PLATELET
Abs Immature Granulocytes: 0.02 10*3/uL (ref 0.00–0.07)
Basophils Absolute: 0 10*3/uL (ref 0.0–0.1)
Basophils Relative: 1 %
Eosinophils Absolute: 0.1 10*3/uL (ref 0.0–0.5)
Eosinophils Relative: 1 %
HCT: 44.3 % (ref 39.0–52.0)
Hemoglobin: 14.3 g/dL (ref 13.0–17.0)
Immature Granulocytes: 0 %
Lymphocytes Relative: 30 %
Lymphs Abs: 2.1 10*3/uL (ref 0.7–4.0)
MCH: 29.8 pg (ref 26.0–34.0)
MCHC: 32.3 g/dL (ref 30.0–36.0)
MCV: 92.3 fL (ref 80.0–100.0)
Monocytes Absolute: 0.5 10*3/uL (ref 0.1–1.0)
Monocytes Relative: 7 %
Neutro Abs: 4.3 10*3/uL (ref 1.7–7.7)
Neutrophils Relative %: 61 %
Platelets: 210 10*3/uL (ref 150–400)
RBC: 4.8 MIL/uL (ref 4.22–5.81)
RDW: 12.6 % (ref 11.5–15.5)
WBC: 7 10*3/uL (ref 4.0–10.5)
nRBC: 0 % (ref 0.0–0.2)

## 2019-05-07 LAB — LIPASE, BLOOD: Lipase: 22 U/L (ref 11–51)

## 2019-05-07 MED ORDER — IOHEXOL 300 MG/ML  SOLN
100.0000 mL | Freq: Once | INTRAMUSCULAR | Status: AC | PRN
  Administered 2019-05-07: 100 mL via INTRAVENOUS

## 2019-05-07 MED ORDER — SODIUM CHLORIDE 0.9 % IV BOLUS
1000.0000 mL | Freq: Once | INTRAVENOUS | Status: AC
  Administered 2019-05-07: 1000 mL via INTRAVENOUS

## 2019-05-07 MED ORDER — ONDANSETRON HCL 4 MG PO TABS: 4.0000 mg | ORAL_TABLET | Freq: Four times a day (QID) | ORAL | 0 refills | Status: DC

## 2019-05-07 MED ORDER — LOPERAMIDE HCL 2 MG PO CAPS: 2.0000 mg | ORAL_CAPSULE | Freq: Four times a day (QID) | ORAL | 0 refills | Status: DC | PRN

## 2019-05-07 NOTE — Discharge Instructions (Addendum)
You were seen in the ED today for abdominal pain, nausea, and diarrhea Your labwork was reassuring today. Your kidney function, liver function, and pancreas function were within normal limits today.  We obtained an ultrasound of your gallbladder; it did not show any stones. It did show a fatty liver. Please try to cut back on alcohol if you drink any and try to eat healthier.  A CT scan was also obtained; no acute findings.  I have prescribed medication for your nausea and diarrhea.  If no improvement in a few days please follow up with your PCP for further evaluation.

## 2019-05-07 NOTE — ED Provider Notes (Signed)
[facility] EMERGENCY DEPARTMENT Provider Note   CSN: [id number] Arrival date & time: 05/07/19  1432    History   Chief Complaint Chief Complaint  Patient presents with   Abdominal Pain    HPI Bradley [name] is a 23 y.o. male who presents to the ED today complaining of gradual onset, intermittent, periumbilical abdominal pain x 2 days. Pt also complains of watery diarrhea and nausea. He was seen at [facility] earlier today and sent here to rule out issues with his gallbladder. Per their note, pt had RUQ tenderness on exam. Pt reports he has had ultrasounds of his gallbladder in the past, most recent 11/15/18 without any findings. Pt has not found any pattern for his pain. Denies fever, chills, vomiting, constipation, urinary sx, or any other associated symptoms. No previous past abdominal surgeries.        Past Medical History:  Diagnosis Date   Anxiety    Chronic headaches    Clavicular fracture    History of chickenpox    Mild intermittent asthma 06/07/2018   Shingles    23 years old.    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 Authorizing Provider  acetaminophen (TYLENOL) 500 MG tablet Take 1,000 mg by mouth every 6 (six) hours as needed.    [provider]    AMBULATORY NON FORMULARY MEDICATION Inhaler. Patient said he re rarely uses it. Was told to bring to upcoming procedure to be added on    [provider]  cyclobenzaprine (FLEXERIL) 10 MG tablet Take 1 tablet (10 mg total) by mouth 3 (three) times daily as needed for muscle spasms. 03/17/19   Brunetta Jeans, PA-C  EPINEPHrine (AUVI-Q) 0.3 mg/0.3 mL IJ SOAJ injection Use as directed for severe allergic reaction 02/07/19   Ambs, Kathrine Cords, FNP  Erenumab-aooe (AIMOVIG) 70 MG/ML SOAJ Inject 70 mg into the skin every 30 (thirty) days. 03/26/19   Suzzanne Cloud, NP  levocetirizine (XYZAL) 5 MG tablet Take 5 mg by mouth every evening.    [provider]  loperamide (IMODIUM) 2 MG capsule Take 1 capsule (2 mg total) by mouth 4 (four) times daily as needed for diarrhea or loose stools. 05/07/19   Cornie Herrington, PA-C  methylPREDNISolone (MEDROL) 4 MG TBPK tablet Take following package directions. 03/18/19   Brunetta Jeans, PA-C  ondansetron (ZOFRAN) 4 MG tablet Take 1 tablet (4 mg total) by mouth every 6 (six) hours. 05/07/19   Keldric Poyer, PA-C  ondansetron (ZOFRAN) 8 MG tablet 8 mg as needed.  02/19/19   [provider]  Probiotic Product ([place]) CAPS Take 1 capsule by mouth daily.    [provider]  propranolol (INDERAL) 20 MG tablet TAKE 1 TABLET BY MOUTH TWICE A DAY  04/29/19   Suzzanne Cloud, NP  topiramate (TOPAMAX) 25 MG tablet Start taking 1 tablet at bedtime x 3 days, then take 2 tablets at bedtime x 3 days, then take 3 tablets at bedtime 03/20/19   Suzzanne Cloud, NP    Family History Family History  Problem Relation Age of Onset   Hypertension Mother    Colon polyps Mother        might be mistakrn   Diabetes Father        type II   Migraines Sister    Colon cancer Neg Hx    Esophageal cancer Neg Hx    Rectal cancer Neg Hx    Stomach cancer Neg Hx     Social History Social History   Tobacco Use   Smoking status: Never  Smoker   Smokeless tobacco: Never Used  Substance Use Topics   Alcohol use: Yes    Frequency: Never    Comment: Seldom   Drug use: Not Currently    Types: Marijuana    Comment: rare use     Allergies   Shellfish allergy and Hydrocodone   Review of Systems Review of Systems  Constitutional: Negative for chills and fever.  Eyes: Negative for visual disturbance.  Respiratory: Negative for cough.   Cardiovascular: Negative for chest pain.  Gastrointestinal: Positive for abdominal pain, diarrhea and nausea. Negative for constipation and vomiting.  Genitourinary: Negative for difficulty urinating.  Musculoskeletal: Negative for myalgias.  Skin: Negative for rash.  Neurological: Negative for headaches.     Physical Exam Updated Vital Signs BP 136/69 (BP Location: Right Arm)    Pulse 64    Temp 97.9 F (36.6 C) (Oral)    Resp 16    Ht 6\' 6"  (1.981 m)    Wt (!) 163.3 kg    SpO2 99%    BMI 41.60 kg/m   Physical Exam Vitals signs and nursing note reviewed.  Constitutional:      Appearance: He is not ill-appearing.  HENT:     Head: Normocephalic and atraumatic.  Eyes:     Conjunctiva/sclera: Conjunctivae normal.  Neck:     Musculoskeletal: Neck supple.  Cardiovascular:     Rate and Rhythm: Normal rate and regular rhythm.  Pulmonary:     Effort: Pulmonary effort is normal.     Breath sounds: Normal breath sounds.  Abdominal:     Palpations: Abdomen is soft.     Tenderness: There is generalized abdominal tenderness. There is no guarding or rebound. Negative signs include Murphy's sign.     Comments: Soft, diffuse tenderness throughout abdomen but more so in LLQ, +BS throughout, no r/g/r, neg murphy's, neg mcburney's, no CVA TTP   Skin:    General: Skin is warm and dry.  Neurological:     Mental Status: He is alert.      ED Treatments / Results  Labs (all labs ordered are listed, but only abnormal results are displayed) Labs Reviewed  COMPREHENSIVE METABOLIC  PANEL - Abnormal; Notable for the following components:      Result Value   Calcium 8.7 (*)    All other components within normal limits  URINALYSIS, ROUTINE W REFLEX MICROSCOPIC - Abnormal; Notable for the following components:   Specific Gravity, Urine >1.030 (*)    Ketones, ur 15 (*)    All other components within normal limits  LIPASE, BLOOD  CBC WITH DIFFERENTIAL/PLATELET    EKG None  Radiology Ct Abdomen Pelvis W Contrast  Result  Date: 05/07/2019 CLINICAL DATA:  Generalized abdominal pain periumbilical pain EXAM: CT ABDOMEN AND PELVIS WITH CONTRAST TECHNIQUE: Multidetector CT imaging of the abdomen and pelvis was performed using the standard protocol following bolus administration of intravenous contrast. CONTRAST:  162mL OMNIPAQUE IOHEXOL 300 MG/ML  SOLN COMPARISON:  Ultrasound 05/07/2019, CT 02/13/2018 FINDINGS: Lower chest: Lung bases demonstrate no acute consolidation or effusion. The heart size is normal Hepatobiliary: No focal liver abnormality is seen. No gallstones, gallbladder wall thickening, or biliary dilatation. Pancreas: Unremarkable. No pancreatic ductal dilatation or surrounding inflammatory changes. Spleen: Normal in size without focal abnormality. Adrenals/Urinary Tract: Adrenal glands are unremarkable. Kidneys are normal, without renal calculi, focal lesion, or hydronephrosis. Bladder is unremarkable. Stomach/Bowel: Stomach is within normal limits. Appendix appears normal. No evidence of bowel wall thickening, distention, or inflammatory changes. Vascular/Lymphatic: No significant vascular findings are present. No enlarged abdominal or pelvic lymph nodes. Subcentimeter right lower quadrant mesenteric nodes no change. Reproductive: Prostate is unremarkable. Other: No abdominal wall hernia or abnormality. No abdominopelvic ascites. Musculoskeletal: No acute or significant osseous findings. IMPRESSION: Negative. No CT evidence for acute intra-abdominal or pelvic abnormality.  Negative for acute appendicitis Electronically Signed   By: Donavan Foil M.D.   On: 05/07/2019 17:30   US Abdomen Limited Ruq  Result Date: 05/07/2019 CLINICAL DATA:  Right upper quadrant pain EXAM: ULTRASOUND ABDOMEN LIMITED RIGHT UPPER QUADRANT COMPARISON:  Abdominal ultrasound November 15, 2018 FINDINGS: Gallbladder: No gallstones or wall thickening visualized. There is no pericholecystic fluid. No sonographic Murphy sign noted by sonographer. Common bile duct: Diameter: 2 mm. No intrahepatic or extrahepatic biliary duct dilatation. Liver: No focal lesion identified. Liver echogenicity overall is somewhat increased. Portal vein is patent on color Doppler imaging with normal direction of blood flow towards the liver. Other: None. IMPRESSION: Overall increase in liver echogenicity, a finding felt to be indicative of a degree of hepatic steatosis. No focal liver lesions evident. Study otherwise unremarkable. Electronically Signed   By: Lowella Grip III M.D.   On: 05/07/2019 16:48    Procedures Procedures (including critical care time)  Medications Ordered in ED Medications  sodium chloride 0.9 % bolus 1,000 mL ( Intravenous Stopped 05/07/19 1612)  iohexol (OMNIPAQUE) 300 MG/ML solution 100 mL (100 mLs Intravenous Contrast Given 05/07/19 1715)     Initial Impression / Assessment and Plan / ED Course  I have reviewed the triage vital signs and the nursing notes.  Pertinent labs & imaging results that were available during my care of the patient were reviewed by me and considered in my medical decision making (see chart for details).    Pt is an otherwise healthy 23 year old male who presents to the ED today complaining of intermittent periumbilical abdominal pain with nausea and diarrhea. Seen at UC this AM and sent here for further eval of gallbladder given pt was tender to RUQ during their exam. Unable to illicit RUQ tenderness on my exam; negative murphy's sign. Pt is more tender in the  LLQ but endorses pain throughout abdomen. No previous past surgical history to abdomen. Will proceed with RUQ ultrasound at this time with baseline bloodwork and IVFs for symptomatic relief. If no findings on ultrasound may need to proceed with CT scan given pt still has appendicitis and periumbilical pain. Pt afebrile in the ED without tachycardia or tachypnea currently.   RUQ ultrasound negative today besides a fatty liver. Labwork very reassuring - no leukocytosis. Hgb stable. Creatinine and LFTs within normal limits. Lipase negative. U/A did  show some ketones; pt receiving IVFs. Will proceed with CT A/P today given periumbilical pain/this could be early presentation of appendicitis.   CT scan negative today as well. Will discharge pt home at this time. Pt advised to increase fluid intake given diarrhea. Will prescribe zofran and immodium for symptomatic relief. Pt to follow up with PCP. Strict return precautions discussed. He is in agreement with plan and stable for discharge home.        Final Clinical Impressions(s) / ED Diagnoses   Final diagnoses:  Abdominal pain  Periumbilical abdominal pain  Nausea  Diarrhea, unspecified type  Hepatic steatosis    ED Discharge Orders         Ordered    ondansetron (ZOFRAN) 4 MG tablet  Every 6 hours     05/07/19 1755    loperamide (IMODIUM) 2 MG capsule  4 times daily PRN     05/07/19 [place], [place]hton Sabine, PA-C 05/07/19 [place], [place], DO 05/10/19 [phone]

## 2019-05-07 NOTE — ED Notes (Signed)
Patient transported to Ultrasound 

## 2019-05-07 NOTE — ED Triage Notes (Signed)
Pt c/o periumbilical pain x 2 days. Pt has had diarrhea and intermittent nausea. Pt was seen at [facility] today and referred here to r/o problems with the gall bladder.

## 2019-05-07 NOTE — ED Notes (Signed)
Patient transported to CT 

## 2019-05-07 NOTE — ED Notes (Signed)
ED Provider at bedside. 

## 2019-05-08 NOTE — Progress Notes (Signed)
Vials exp 05-11-2020

## 2019-05-12 DIAGNOSIS — J301 Allergic rhinitis due to pollen: Secondary | ICD-10-CM | POA: Diagnosis not present

## 2019-05-26 ENCOUNTER — Encounter: Payer: Self-pay | Admitting: Physician Assistant

## 2019-05-26 ENCOUNTER — Ambulatory Visit (INDEPENDENT_AMBULATORY_CARE_PROVIDER_SITE_OTHER): Payer: 59 | Admitting: Physician Assistant

## 2019-05-26 ENCOUNTER — Other Ambulatory Visit: Payer: Self-pay

## 2019-05-26 DIAGNOSIS — H66001 Acute suppurative otitis media without spontaneous rupture of ear drum, right ear: Secondary | ICD-10-CM | POA: Diagnosis not present

## 2019-05-26 DIAGNOSIS — H6981 Other specified disorders of Eustachian tube, right ear: Secondary | ICD-10-CM | POA: Diagnosis not present

## 2019-05-26 MED ORDER — CEFDINIR 300 MG PO CAPS: 300.0000 mg | ORAL_CAPSULE | Freq: Two times a day (BID) | ORAL | 0 refills | Status: DC

## 2019-05-26 NOTE — Progress Notes (Signed)
I have discussed the procedure for the virtual visit with the patient who has given consent to proceed with assessment and treatment.   Bradley [name] S Terry [place], CMA     

## 2019-05-26 NOTE — Progress Notes (Signed)
Virtual Visit via Video   I connected with patient on 05/26/19 at  2:30 PM EDT by a video enabled telemedicine application and verified that I am speaking with the correct person using two identifiers.  Location patient: Home Location provider: [facility], Office Persons participating in the virtual visit: Patient, Provider, Ross (Patina Moore)  I discussed the limitations of evaluation and management by telemedicine and the availability of in person appointments. The patient expressed understanding and agreed to proceed.  Subjective:   HPI:  Patient presents via [facility] today c/o 5 days of pain and pressure of R ear with some popping. Notes some nasal congestion. Denies sinus pressure, sinus pain, fever, chills, malaise or fatigue. Is taking Xyzal regularly for allergy symptoms but has not taken anything OTC for current symptoms. Notes some improvement in pressure and pain today but still present. Denies drainage from ears, tinnitus or change in hearing.  ROS:   See pertinent positives and negatives per HPI.  Patient Active Problem List   Diagnosis Date Noted  . 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 Hamstring injury, right, initial encounter 01/23/2019  . Migraine without aura and without status migrainosus, not intractable 07/10/2018  . Morbid obesity with BMI of 40.0-44.9, adult ([place]) 07/10/2018  . Allergic rhinitis 06/07/2018  . Mild intermittent asthma 06/07/2018  . Atypical chest pain 12/21/2017  . Generalized abdominal pain 12/17/2017  . Anxiety 11/28/2017    Social History   Tobacco Use  . Smoking status: Never Smoker  . Smokeless tobacco: Never Used  Substance Use Topics  . Alcohol use: Yes    Frequency: Never    Comment: Seldom    Current Outpatient Medications:  .  acetaminophen (TYLENOL) 500 MG tablet, Take 1,000 mg by mouth every 6 (six) hours as needed., Disp: , Rfl:  .  AMBULATORY NON FORMULARY MEDICATION, Inhaler.  Patient said he re rarely uses it. Was told to bring to upcoming procedure to be added on, Disp: , Rfl:  .  EPINEPHrine (AUVI-Q) 0.3 mg/0.3 mL IJ SOAJ injection, Use as directed for severe allergic reaction, Disp: 2 Device, Rfl: 1 .  Erenumab-aooe (AIMOVIG) 70 MG/ML SOAJ, Inject 70 mg into the skin every 30 (thirty) days., Disp: 1 pen, Rfl: 3 .  levocetirizine (XYZAL) 5 MG tablet, Take 5 mg by mouth every evening., Disp: , Rfl:  .  Probiotic Product ([place]) CAPS, Take 1 capsule by mouth daily., Disp: , Rfl:  .  propranolol (INDERAL) 20 MG tablet, TAKE 1 TABLET BY MOUTH TWICE A DAY, Disp: 180 tablet, Rfl: 1  Allergies  Allergen Reactions  . Shellfish Allergy Anaphylaxis  . Hydrocodone Itching    Pt states he feels itchy after taking, but it doesn't preclude him taking hydrocodone if needed    Objective:   There were no vitals taken for this visit.  Patient is well-developed, well-nourished in no acute distress.  Resting comfortably at home.  Head is normocephalic, atraumatic.  No labored breathing.  Speech is clear and coherent with logical content.  Patient is alert and oriented at baseline.   Assessment and Plan:   1. Eustachian tube dysfunction, right 2. Non-recurrent acute suppurative otitis media of right ear without spontaneous rupture of tympanic membrane Suspect ETD is the main cause of symptoms. Continue Xyzal and begin OTC Flonase or Nasacort. Giving hx of AOM will give Rx Cefdinir (Amoxillin subtherapeutic in the past) to start if symptoms are not continuing to improve. - cefdinir (OMNICEF)  300 MG capsule; Take 1 capsule (300 mg total) by mouth 2 (two) times daily.  Dispense: 20 capsule; Refill: 0    Leeanne Rio, [place] 05/26/2019

## 2019-05-26 NOTE — Patient Instructions (Signed)
Please keep hydrated and continue the Xyzal. Start an over-the-counter nasal steroid (Flonase or Nasacort) Put a air purifier in the bedroom as I think something in the room is triggering allergic inflammation.  If ear pain not continuing to improve or anything worsens, fill the antibiotic (Cefdinir) and take as directed.  Follow-up if symptoms are not resolving.

## 2019-06-04 ENCOUNTER — Ambulatory Visit (INDEPENDENT_AMBULATORY_CARE_PROVIDER_SITE_OTHER): Payer: 59 | Admitting: *Deleted

## 2019-06-04 DIAGNOSIS — J309 Allergic rhinitis, unspecified: Secondary | ICD-10-CM

## 2019-06-11 ENCOUNTER — Other Ambulatory Visit: Payer: Self-pay | Admitting: Neurology

## 2019-06-16 ENCOUNTER — Ambulatory Visit (INDEPENDENT_AMBULATORY_CARE_PROVIDER_SITE_OTHER): Payer: 59

## 2019-06-16 DIAGNOSIS — J309 Allergic rhinitis, unspecified: Secondary | ICD-10-CM | POA: Diagnosis not present

## 2019-06-19 ENCOUNTER — Telehealth: Payer: Self-pay

## 2019-06-19 NOTE — Telephone Encounter (Signed)
06/19/19, Started a renewal PA for the pt on his Aimovig 70mg /ML auto-injectors. Awaiting response.   Key: [id number]

## 2019-06-23 NOTE — Telephone Encounter (Signed)
aimovig approved 70mg  /ml thru  06-18-20 ID [id number].  [facility] rx [phone].

## 2019-06-27 ENCOUNTER — Ambulatory Visit: Payer: 59 | Admitting: Physician Assistant

## 2019-06-30 NOTE — Telephone Encounter (Signed)
Faxed approval to pt pharmacy today, 06/30/19.

## 2019-07-01 ENCOUNTER — Encounter: Payer: Self-pay | Admitting: Physician Assistant

## 2019-07-18 ENCOUNTER — Encounter: Payer: Self-pay | Admitting: Physician Assistant

## 2019-07-30 ENCOUNTER — Other Ambulatory Visit: Payer: Self-pay

## 2019-07-30 ENCOUNTER — Emergency Department
Admission: EM | Admit: 2019-07-30 | Discharge: 2019-07-30 | Disposition: A | Discharge status: Home / Self Care | Payer: 59 | Source: Home / Self Care

## 2019-07-30 DIAGNOSIS — M545 Low back pain, unspecified: Secondary | ICD-10-CM

## 2019-07-30 LAB — POCT URINALYSIS DIP (MANUAL ENTRY)
Bilirubin, UA: NEGATIVE
Blood, UA: NEGATIVE
Glucose, UA: NEGATIVE mg/dL
Ketones, POC UA: NEGATIVE mg/dL
Leukocytes, UA: NEGATIVE
Nitrite, UA: NEGATIVE
Protein Ur, POC: NEGATIVE mg/dL
Spec Grav, UA: 1.01 (ref 1.010–1.025)
Urobilinogen, UA: 0.2 E.U./dL
pH, UA: 6 (ref 5.0–8.0)

## 2019-07-30 NOTE — ED Provider Notes (Signed)
[name]    CSN: [id number] Arrival date & time: 07/30/19  1649      History   Chief Complaint Chief Complaint  Patient presents with  . Back Pain    left    HPI Bradley [name] is a 23 y.o. male.   HPI  Bradley [name] is a 23 y.o. male presenting to UC with c/o Left lower back pain that started a few days ago. Pain is aching and sore. He reports being dx with a UTI at a different urgent care last week and was started on 10 days of Levaquin, which he has since finished. He is concerned the infection has come back. He had groin pain and a positive home test at that time.  Today he denies groin pain and does not have any dysuria, frequency or hematuria. No hx of prostate issues. Denies concern for STIs. Denies fever, chills, n/v/d. No abdominal pain. He has not tried any OTC pain medication for his back. No known injury.  No hx of kidney stones.   Past Medical History:  Diagnosis Date  . Anxiety   . Chronic headaches   . Clavicular fracture   . History of chickenpox   . Mild intermittent asthma 06/07/2018  . Shingles    23 years old.    Patient Active Problem List   Diagnosis Date Noted  . 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 Hamstring injury, right, initial encounter 01/23/2019  . Migraine without aura and without status migrainosus, not intractable 07/10/2018  . Morbid obesity with BMI of 40.0-44.9, adult ([place]) 07/10/2018  . Allergic rhinitis 06/07/2018  . Mild intermittent asthma 06/07/2018  . Atypical chest pain 12/21/2017  . Generalized abdominal pain 12/17/2017  . Anxiety 11/28/2017    Past Surgical History:  Procedure Laterality Date  . CYSTECTOMY  01/2019   scrotum area   . WISDOM TOOTH EXTRACTION         Home Medications    Prior to Admission medications   Medication Sig Start Date End Date Taking? Authorizing Provider  cefdinir (OMNICEF) 300 MG capsule Take 1 capsule (300 mg total) by mouth 2 (two) times  daily. 05/26/19   Brunetta Jeans, PA-C  levocetirizine (XYZAL) 5 MG tablet Take 5 mg by mouth every evening.    [provider]  propranolol (INDERAL) 20 MG tablet TAKE 1 TABLET BY MOUTH TWICE A DAY Patient not taking: Reported on 05/26/2019 04/29/19   Suzzanne Cloud, NP    Family History Family History  Problem Relation Age of Onset  . Hypertension Mother   . Colon polyps Mother        might be mistakrn  . Diabetes Father        type II  . Migraines Sister   . Colon cancer Neg Hx   . Esophageal cancer Neg Hx   . Rectal cancer Neg Hx   . Stomach cancer Neg Hx     Social History Social History   Tobacco Use  . Smoking status: Never Smoker  . Smokeless tobacco: Never Used  Substance Use Topics  . Alcohol use: Yes    Frequency: Never    Comment: Seldom  . Drug use: Not Currently    Types: Marijuana    Comment: rare use     Allergies   Shellfish allergy and Hydrocodone   Review of Systems Review of Systems  Gastrointestinal: Negative for abdominal pain, diarrhea, nausea and vomiting.  Genitourinary: Positive for  flank pain (left). Negative for decreased urine volume, discharge, dysuria, frequency, hematuria, scrotal swelling, testicular pain and urgency.  Musculoskeletal: Positive for back pain (Left lower) and myalgias.  Neurological: Negative for weakness and numbness.     Physical Exam Triage Vital Signs ED Triage Vitals  Enc Vitals Group     BP 07/30/19 1706 128/85     Pulse Rate 07/30/19 1706 80     Resp 07/30/19 1706 20     Temp 07/30/19 1706 98.6 F (37 C)     Temp Source 07/30/19 1706 Oral     SpO2 07/30/19 1706 98 %     Weight 07/30/19 1707 (!) 363 lb (164.7 kg)     Height 07/30/19 1707 6\' 6"  (1.981 m)     Head Circumference --      Peak Flow --      Pain Score 07/30/19 1707 3     Pain Loc --      Pain Edu? --      Excl. in [place]? --    No data found.  Updated Vital Signs BP 128/85 (BP Location: Right Arm)   Pulse 80   Temp 98.6 F  (37 C) (Oral)   Resp 20   Ht 6\' 6"  (1.981 m)   Wt (!) 363 lb (164.7 kg)   SpO2 98%   BMI 41.95 kg/m   Visual Acuity Right Eye Distance:   Left Eye Distance:   Bilateral Distance:    Right Eye Near:   Left Eye Near:    Bilateral Near:     Physical Exam Vitals signs and nursing note reviewed.  Constitutional:      Appearance: Normal appearance. He is well-developed. He is obese.  HENT:     Head: Normocephalic and atraumatic.  Neck:     Musculoskeletal: Normal range of motion.  Cardiovascular:     Rate and Rhythm: Normal rate and regular rhythm.  Pulmonary:     Effort: Pulmonary effort is normal.     Breath sounds: Normal breath sounds.  Abdominal:     General: There is no distension.     Palpations: Abdomen is soft.     Tenderness: There is no abdominal tenderness. There is no right CVA tenderness or left CVA tenderness.  Musculoskeletal: Normal range of motion.        General: Tenderness present.       Back:  Skin:    General: Skin is warm and dry.  Neurological:     Mental Status: He is alert and oriented to person, place, and time.  Psychiatric:        Behavior: Behavior normal.      UC Treatments / Results  Labs (all labs ordered are listed, but only abnormal results are displayed) Labs Reviewed  POCT URINALYSIS DIP (MANUAL ENTRY)    EKG   Radiology No results found.  Procedures Procedures (including critical care time)  Medications Ordered in UC Medications - No data to display  Initial Impression / Assessment and Plan / UC Course  I have reviewed the triage vital signs and the nursing notes.  Pertinent labs & imaging results that were available during my care of the patient were reviewed by me and considered in my medical decision making (see chart for details).     UA: normal No evidence of UTI Hx and exam c/w muscle strain Encouraged conservative tx F/u with PCP next week if not improving AVS provided.  Final Clinical  Impressions(s) / UC Diagnoses  Final diagnoses:  Acute left-sided low back pain without sciatica     Discharge Instructions      You may take 500mg  acetaminophen every 4-6 hours or in combination with ibuprofen 400-600mg  every 6-8 hours as needed for pain and inflammation.  Please follow up with family medicine next week if not improving.    ED Prescriptions    None     PDMP not reviewed this encounter.   Noe Gens, [place] 07/30/19 [phone]

## 2019-07-30 NOTE — Discharge Instructions (Signed)
°  You may take 500mg  acetaminophen every 4-6 hours or in combination with ibuprofen 400-600mg  every 6-8 hours as needed for pain and inflammation.  Please follow up with family medicine next week if not improving.

## 2019-07-30 NOTE — ED Triage Notes (Signed)
Had a UTI a little over a week ago, and the last few days has had back pain on the left lower.  Concerned that UTI has come back.

## 2019-08-04 ENCOUNTER — Ambulatory Visit (INDEPENDENT_AMBULATORY_CARE_PROVIDER_SITE_OTHER): Payer: 59

## 2019-08-04 DIAGNOSIS — J309 Allergic rhinitis, unspecified: Secondary | ICD-10-CM

## 2019-08-05 ENCOUNTER — Encounter: Payer: Self-pay | Admitting: Physician Assistant

## 2019-08-15 ENCOUNTER — Ambulatory Visit (INDEPENDENT_AMBULATORY_CARE_PROVIDER_SITE_OTHER): Payer: 59

## 2019-08-15 ENCOUNTER — Other Ambulatory Visit: Payer: Self-pay

## 2019-08-15 ENCOUNTER — Encounter: Payer: Self-pay | Admitting: Emergency Medicine

## 2019-08-15 ENCOUNTER — Emergency Department (INDEPENDENT_AMBULATORY_CARE_PROVIDER_SITE_OTHER)
Admission: EM | Admit: 2019-08-15 | Discharge: 2019-08-15 | Disposition: A | Discharge status: Home / Self Care | Payer: 59 | Source: Home / Self Care | Attending: Family Medicine | Admitting: Family Medicine

## 2019-08-15 DIAGNOSIS — R300 Dysuria: Secondary | ICD-10-CM | POA: Diagnosis not present

## 2019-08-15 DIAGNOSIS — J309 Allergic rhinitis, unspecified: Secondary | ICD-10-CM

## 2019-08-15 LAB — POCT URINALYSIS DIP (MANUAL ENTRY)
Bilirubin, UA: NEGATIVE
Blood, UA: NEGATIVE
Glucose, UA: NEGATIVE mg/dL
Ketones, POC UA: NEGATIVE mg/dL
Leukocytes, UA: NEGATIVE
Nitrite, UA: NEGATIVE
Protein Ur, POC: NEGATIVE mg/dL
Spec Grav, UA: >=1.03 — AB (ref 1.010–1.025)
Urobilinogen, UA: 0.2 E.U./dL
pH, UA: 6.5 (ref 5.0–8.0)

## 2019-08-15 NOTE — Discharge Instructions (Addendum)
Drink enough fluid to keep your urine pale yellow.

## 2019-08-15 NOTE — ED Triage Notes (Signed)
Patient here for follow up on UTI possibility from 2 weeks ago; he now has urgency and wanted UA.

## 2019-08-15 NOTE — ED Provider Notes (Signed)
[name]    CSN: [id number] Arrival date & time: 08/15/19  1422      History   Chief Complaint Chief Complaint  Patient presents with  . Urinary Urgency    HPI MD GORY is a 23 y.o. male.   Patient complains of mild dysuria and urgency for two days.  He denies urethral discharge, testicular pain/swelling, abdominal pain, and fever.  He denies possibility of STD.  The history is provided by the patient.  Dysuria Presenting symptoms: dysuria   Presenting symptoms: no penile discharge, no penile pain, no scrotal pain and no swelling   Context: during urination   Relieved by:  None tried Worsened by:  Urination Ineffective treatments:  None tried Associated symptoms: urinary hesitation   Associated symptoms: no abdominal pain, no fever, no flank pain, no genital itching, no genital lesions, no genital rash, no groin pain, no hematuria, no nausea, no penile redness, no penile swelling, no scrotal swelling, no urinary frequency, no urinary incontinence and no urinary retention     Past Medical History:  Diagnosis Date  . Anxiety   . Chronic headaches   . Clavicular fracture   . History of chickenpox   . Mild intermittent asthma 06/07/2018  . Shingles    23 years old.    Patient Active Problem List   Diagnosis Date Noted  . 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 Hamstring injury, right, initial encounter 01/23/2019  . Migraine without aura and without status migrainosus, not intractable 07/10/2018  . Morbid obesity with BMI of 40.0-44.9, adult ([place]) 07/10/2018  . Allergic rhinitis 06/07/2018  . Mild intermittent asthma 06/07/2018  . Atypical chest pain 12/21/2017  . Generalized abdominal pain 12/17/2017  . Anxiety 11/28/2017    Past Surgical History:  Procedure Laterality Date  . CYSTECTOMY  01/2019   scrotum area   . WISDOM TOOTH EXTRACTION         Home Medications    Prior to Admission medications    Medication Sig Start Date End Date Taking? Authorizing Provider  cefdinir (OMNICEF) 300 MG capsule Take 1 capsule (300 mg total) by mouth 2 (two) times daily. 05/26/19   Brunetta Jeans, PA-C  levocetirizine (XYZAL) 5 MG tablet Take 5 mg by mouth every evening.    [provider]  propranolol (INDERAL) 20 MG tablet TAKE 1 TABLET BY MOUTH TWICE A DAY Patient not taking: Reported on 05/26/2019 04/29/19   Suzzanne Cloud, NP    Family History Family History  Problem Relation Age of Onset  . Hypertension Mother   . Colon polyps Mother        might be mistakrn  . Diabetes Father        type II  . Migraines Sister   . Colon cancer Neg Hx   . Esophageal cancer Neg Hx   . Rectal cancer Neg Hx   . Stomach cancer Neg Hx     Social History Social History   Tobacco Use  . Smoking status: Never Smoker  . Smokeless tobacco: Never Used  Substance Use Topics  . Alcohol use: Yes    Frequency: Never    Comment: Seldom  . Drug use: Not Currently    Types: Marijuana    Comment: rare use     Allergies   Shellfish allergy and Hydrocodone   Review of Systems Review of Systems  Constitutional: Negative for fever.  Gastrointestinal: Negative for abdominal pain and nausea.  Genitourinary: Positive  for dysuria and hesitancy. Negative for bladder incontinence, discharge, flank pain, frequency, hematuria, penile pain, penile swelling and scrotal swelling.  All other systems reviewed and are negative.    Physical Exam Triage Vital Signs ED Triage Vitals  Enc Vitals Group     BP 08/15/19 1518 132/82     Pulse Rate 08/15/19 1518 77     Resp 08/15/19 1518 16     Temp 08/15/19 1518 98 F (36.7 C)     Temp Source 08/15/19 1518 Oral     SpO2 08/15/19 1518 99 %     Weight 08/15/19 1519 (!) 350 lb (158.8 kg)     Height 08/15/19 1519 6\' 6"  (1.981 m)     Head Circumference --      Peak Flow --      Pain Score 08/15/19 1519 0     Pain Loc --      Pain Edu? --      Excl. in [place]? --     No data found.  Updated Vital Signs BP 132/82 (BP Location: Right Arm)   Pulse 77   Temp 98 F (36.7 C) (Oral)   Resp 16   Ht 6\' 6"  (1.981 m)   Wt (!) 158.8 kg   SpO2 99%   BMI 40.45 kg/m   Visual Acuity Right Eye Distance:   Left Eye Distance:   Bilateral Distance:    Right Eye Near:   Left Eye Near:    Bilateral Near:     Physical Exam Nursing notes and Vital Signs reviewed. Appearance:  Patient appears stated age, and in no acute distress.    Eyes:  Pupils are equal, round, and reactive to light and accomodation.  Extraocular movement is intact.  Conjunctivae are not inflamed   Pharynx:  Normal; moist mucous membranes  Neck:  Supple.  No adenopathy Lungs:  Clear to auscultation.  Breath sounds are equal.  Moving air well. Heart:  Regular rate and rhythm without murmurs, rubs, or gallops.  Abdomen:  Nontender without masses or hepatosplenomegaly.  Bowel sounds are present.  No CVA or flank tenderness.  Extremities:  No edema.  Skin:  No rash present.     UC Treatments / Results  Labs (all labs ordered are listed, but only abnormal results are displayed) Labs Reviewed  POCT URINALYSIS DIP (MANUAL ENTRY) - Abnormal; Notable for the following components:      Result Value   Spec Grav, UA >=1.030 (*)    All other components within normal limits  URINE CULTURE    EKG   Radiology No results found.  Procedures Procedures (including critical care time)  Medications Ordered in UC Medications - No data to display  Initial Impression / Assessment and Plan / UC Course  I have reviewed the triage vital signs and the nursing notes.  Pertinent labs & imaging results that were available during my care of the patient were reviewed by me and considered in my medical decision making (see chart for details).    Note normal urinalysis (maximally concentrated) Urine culture pending. Followup with Family Doctor if symptoms worsen.   Final Clinical Impressions(s) /  UC Diagnoses   Final diagnoses:  Dysuria     Discharge Instructions     Drink enough fluid to keep your urine pale yellow.    ED Prescriptions    None        Kandra Nicolas, MD 08/17/19 2045

## 2019-08-16 LAB — URINE CULTURE
MICRO NUMBER:: 1125197
Result:: NO GROWTH
SPECIMEN QUALITY:: ADEQUATE

## 2019-08-24 ENCOUNTER — Encounter: Payer: Self-pay | Admitting: Physician Assistant

## 2019-08-26 ENCOUNTER — Encounter: Payer: Self-pay | Admitting: Physician Assistant

## 2019-08-26 HISTORY — PX: OTHER SURGICAL HISTORY: SHX169

## 2019-09-01 ENCOUNTER — Other Ambulatory Visit: Payer: Self-pay | Admitting: Physician Assistant

## 2019-09-01 MED ORDER — CEPHALEXIN 500 MG PO CAPS: 500.0000 mg | ORAL_CAPSULE | Freq: Two times a day (BID) | ORAL | 0 refills | Status: AC

## 2019-09-02 ENCOUNTER — Encounter: Payer: Self-pay | Admitting: Physician Assistant

## 2019-09-02 ENCOUNTER — Other Ambulatory Visit: Payer: Self-pay

## 2019-09-02 ENCOUNTER — Ambulatory Visit (INDEPENDENT_AMBULATORY_CARE_PROVIDER_SITE_OTHER): Payer: 59 | Admitting: Physician Assistant

## 2019-09-02 DIAGNOSIS — L03111 Cellulitis of right axilla: Secondary | ICD-10-CM

## 2019-09-02 NOTE — Progress Notes (Signed)
I have discussed the procedure for the virtual visit with the patient who has given consent to proceed with assessment and treatment.   Deng Kemler S Klee Kolek, CMA     

## 2019-09-02 NOTE — Progress Notes (Signed)
Virtual Visit via Video   I connected with patient on 09/02/19 at 10:00 AM EST by a video enabled telemedicine application and verified that I am speaking with the correct person using two identifiers.  Location patient: Home Location provider: [facility] Office Persons participating in the virtual visit: Patient, Provider, [place] (Patina Moore)  I discussed the limitations of evaluation and management by telemedicine and the availability of in person appointments. The patient expressed understanding and agreed to proceed.  Subjective:   HPI:   Patient presents via doxy.me today for treatment of rash in the right axillary region first noticed 4 days ago.  Patient recently had surgery on his right arm.  Notes the area where the rash is is where he was shaved for his procedure.  Denies rash starting right after area was shaved.  Patient notes the area is red, warm to the touch and tender.  Does itch and burn as well.  Denies any blistering lesions of the area.  Denies similar rash noted elsewhere.  Has been trying to keep the area clean and dry.  Did speak with patient yesterday via MyChart messages regarding the rash and a picture was attached for viewing.  Did proceed to have the patient start over-the-counter witch hazel which she has been using.  Notes this is helped considerably with itching and tenderness.  Was also recommended to start on Keflex which was sent in yesterday at time of message.  Patient endorses he started taking this morning.  Tolerating well thus far.  ROS:   See pertinent positives and negatives per HPI.  Patient Active Problem List   Diagnosis Date Noted  . 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 Hamstring injury, right, initial encounter 01/23/2019  . Migraine without aura and without status migrainosus, not intractable 07/10/2018  . Morbid obesity with BMI of 40.0-44.9, adult ([place]) 07/10/2018  . Allergic rhinitis 06/07/2018  .  Mild intermittent asthma 06/07/2018  . Atypical chest pain 12/21/2017  . Generalized abdominal pain 12/17/2017  . Anxiety 11/28/2017    Social History   Tobacco Use  . Smoking status: Never Smoker  . Smokeless tobacco: Never Used  Substance Use Topics  . Alcohol use: Yes    Frequency: Never    Comment: Seldom    Current Outpatient Medications:  .  cephALEXin (KEFLEX) 500 MG capsule, Take 1 capsule (500 mg total) by mouth 2 (two) times daily for 7 days., Disp: 14 capsule, Rfl: 0 .  levocetirizine (XYZAL) 5 MG tablet, Take 5 mg by mouth every evening., Disp: , Rfl:   Allergies  Allergen Reactions  . Shellfish Allergy Anaphylaxis  . Hydrocodone Itching    Pt states he feels itchy after taking, but it doesn't preclude him taking hydrocodone if needed    Objective:   There were no vitals taken for this visit.  Patient is well-developed, well-nourished in no acute distress.  Resting comfortably at home.  Head is normocephalic, atraumatic.  No labored breathing.  Speech is clear and coherent with logical content.  Patient is alert and oriented at baseline.     Assessment and Plan:   1. Cellulitis of axilla, right Mild.  Started Keflex this morning.  Will continue twice daily x7 days.  Continue witch hazel astringent to the area once to twice daily.  Supportive measures and OTC medications reviewed.  Patient is to follow-up if symptoms are not improving/resolving, or if new symptoms develop.  Patient voiced understanding and agreement with the  plan.    Leeanne Rio, PA-C 09/02/2019

## 2019-09-02 NOTE — Patient Instructions (Signed)
Instructions sent to my chart.  Please keep skin clean and dry.  Continue use of the witch hazel astringent.  Take antibiotic as directed.  Consider starting a daily probiotic.  Avoid applying any grooming products to this area until it completely heals.  If symptoms are not resolving with the medication, please call me.  Call or message me immediately if anything worsens or new symptoms develop.  Feel better!

## 2019-09-03 ENCOUNTER — Ambulatory Visit: Payer: 59 | Admitting: Neurology

## 2019-09-15 ENCOUNTER — Ambulatory Visit (INDEPENDENT_AMBULATORY_CARE_PROVIDER_SITE_OTHER): Payer: 59 | Admitting: Physician Assistant

## 2019-09-15 ENCOUNTER — Other Ambulatory Visit: Payer: Self-pay

## 2019-09-15 ENCOUNTER — Encounter: Payer: Self-pay | Admitting: Physician Assistant

## 2019-09-15 VITALS — BP 122/80 | HR 84 | Temp 98.7°F | Resp 16 | Ht 78.0 in | Wt 371.0 lb

## 2019-09-15 DIAGNOSIS — L810 Postinflammatory hyperpigmentation: Secondary | ICD-10-CM | POA: Diagnosis not present

## 2019-09-15 DIAGNOSIS — M26623 Arthralgia of bilateral temporomandibular joint: Secondary | ICD-10-CM

## 2019-09-15 NOTE — Patient Instructions (Signed)
Everything is healed up.  There is a small area of pos-inflammatory hyperpigmentation and dryness of the skin.  Keep hydrated -- can apply Cetaphil, Cerave or Eucerin (good moisturizing creams) to the area. Everything should go back to normal coloration over the next couple of months. If you note recurrence of symptoms, please let me know.  For the TMJ, avoid taking large bites of things because excessive chewing is contributing to pain. Apply cold compresses to the areas twice daily for 10 minutes over the next few days. Can apply topical Voltaren cream OTC around the areas. Let us know or the ENT if symptoms are not resolving.

## 2019-09-15 NOTE — Progress Notes (Signed)
Patient presents to clinic today c/o follow-up of rash of lateral right axillary region.  Patient initially had video visit, thought to have mild cellulitis after recent procedure.  Was started on Keflex twice daily x7 days.  Patient completed antibiotic.  Notes significant improvement in symptoms.  Denies any tenderness, itching, redness, warmth or hardness of skin.  Still some residual darkening of skin noted in the area.  Denies new or worsening symptoms in the area.  Patient also endorses 1 week of bilateral jaw pain, especially with opening the mouth.  Recently saw his ENT for unrelated issue but mention the symptoms.  Was told this was likely TMJ inflammation.  Denies being given instruction on what to do for this.  Patient denies any ear pain with the symptoms.  Symptoms are mildly improved compared to onset.  Past Medical History:  Diagnosis Date  . Anxiety   . Chronic headaches   . Clavicular fracture   . History of chickenpox   . Mild intermittent asthma 06/07/2018  . Shingles    23 years old.    Current Outpatient Medications on File Prior to Visit  Medication Sig Dispense Refill  . levocetirizine (XYZAL) 5 MG tablet Take 5 mg by mouth every evening.     No current facility-administered medications on file prior to visit.    Allergies  Allergen Reactions  . Shellfish Allergy Anaphylaxis  . Hydrocodone Itching    Pt states he feels itchy after taking, but it doesn't preclude him taking hydrocodone if needed    Family History  Problem Relation Age of Onset  . Hypertension Mother   . Colon polyps Mother        might be mistakrn  . Diabetes Father        type II  . Migraines Sister   . Colon cancer Neg Hx   . Esophageal cancer Neg Hx   . Rectal cancer Neg Hx   . Stomach cancer Neg Hx     Social History   Socioeconomic History  . Marital status: Single    Spouse name: Not on file  . Number of children: 0  . Years of education: College  . Highest education  level: Not on file  Occupational History  . Occupation: Sales executive  Tobacco Use  . Smoking status: Never Smoker  . Smokeless tobacco: Never Used  Substance and Sexual Activity  . Alcohol use: Yes    Comment: Seldom  . Drug use: Not Currently    Types: Marijuana    Comment: rare use  . Sexual activity: Yes    Partners: Female  Other Topics Concern  . Not on file  Social History Narrative   Lives alone   Caffeine use: 1 cup coffee per day   Right handed    Social Determinants of Health   Financial Resource Strain:   . Difficulty of Paying Living Expenses: Not on file  Food Insecurity:   . Worried About Charity fundraiser in the Last Year: Not on file  . Ran Out of Food in the Last Year: Not on file  Transportation Needs:   . Lack of Transportation (Medical): Not on file  . Lack of Transportation (Non-Medical): Not on file  Physical Activity:   . Days of Exercise per Week: Not on file  . Minutes of Exercise per Session: Not on file  Stress:   . Feeling of Stress : Not on file  Social Connections:   . Frequency of Communication with  Friends and Family: Not on file  . Frequency of Social Gatherings with Friends and Family: Not on file  . Attends Religious Services: Not on file  . Active Member of Clubs or Organizations: Not on file  . Attends Archivist Meetings: Not on file  . Marital Status: Not on file   Review of Systems - See HPI.  All other ROS are negative.  BP 122/80   Pulse 84   Temp 98.7 F (37.1 C) (Temporal)   Resp 16   Ht 6\' 6"  (1.981 m)   Wt (!) 371 lb (168.3 kg)   SpO2 98%   BMI 42.87 kg/m   Physical Exam Vitals reviewed.  Constitutional:      Appearance: Normal appearance.  HENT:     Head: Normocephalic and atraumatic.     Right Ear: Tympanic membrane normal.     Left Ear: Tympanic membrane normal.     Ears:     Comments: + TMJ discomfort bilaterally with range of motion of jaw.  No palpable or audible crepitus.    Nose: Nose  normal.     Mouth/Throat:     Mouth: Mucous membranes are moist.  Eyes:     Conjunctiva/sclera: Conjunctivae normal.     Pupils: Pupils are equal, round, and reactive to light.  Cardiovascular:     Rate and Rhythm: Normal rate and regular rhythm.     Heart sounds: Normal heart sounds.  Pulmonary:     Effort: Pulmonary effort is normal. No respiratory distress.     Breath sounds: Normal breath sounds. No stridor. No wheezing, rhonchi or rales.  Chest:     Chest wall: No tenderness.  Musculoskeletal:     Cervical back: Neck supple.  Neurological:     General: No focal deficit present.     Mental Status: He is alert and oriented to person, place, and time.  Psychiatric:        Mood and Affect: Mood normal.     Recent Results (from the past 2160 hour(s))  POCT urinalysis dipstick (new)     Status: None   Collection Time: 07/30/19  4:59 PM  Result Value Ref Range   Color, UA yellow yellow   Clarity, UA clear clear   Glucose, UA negative negative mg/dL   Bilirubin, UA negative negative   Ketones, POC UA negative negative mg/dL   Spec Grav, UA 1.010 1.010 - 1.025   Blood, UA negative negative   pH, UA 6.0 5.0 - 8.0   Protein Ur, POC negative negative mg/dL   Urobilinogen, UA 0.2 0.2 or 1.0 E.U./dL   Nitrite, UA Negative Negative   Leukocytes, UA Negative Negative  POCT urinalysis dipstick (new)     Status: Abnormal   Collection Time: 08/15/19  3:32 PM  Result Value Ref Range   Color, UA yellow yellow   Clarity, UA clear clear   Glucose, UA negative negative mg/dL   Bilirubin, UA negative negative   Ketones, POC UA negative negative mg/dL   Spec Grav, UA >=1.030 (A) 1.010 - 1.025   Blood, UA negative negative   pH, UA 6.5 5.0 - 8.0   Protein Ur, POC negative negative mg/dL   Urobilinogen, UA 0.2 0.2 or 1.0 E.U./dL   Nitrite, UA Negative Negative   Leukocytes, UA Negative Negative  Urine culture     Status: None   Collection Time: 08/15/19  3:45 PM   Specimen: Urine,  Clean Catch  Result Value Ref Range  MICRO NUMBER: [id number]    SPECIMEN QUALITY: Adequate    Sample Source URINE    STATUS: FINAL    Result: No Growth     Assessment/Plan: 1. Postinflammatory hyperpigmentation No sign of residual infection on examination.  Recommend moisturizing lotion to the area.  Return precautions reviewed with patient.  2. TMJ tenderness, bilateral Start cold compresses, OTC Voltaren gel around the TMJ region.  Can consider ibuprofen or Tylenol if needed.  Avoid chewing gum and chewing on the most affected side.  Follow-up with ENT if not resolving.   Leeanne Rio, PA-C

## 2019-09-22 ENCOUNTER — Telehealth: Payer: Self-pay

## 2019-09-22 NOTE — Telephone Encounter (Signed)
Patient called wanting to know if he can start his allergy injections back after having his shoulder surgery the first of December. Patient is fully recovered and ready to start back.  His last injection was on 08/15/2019.  Please Advise on where to start back.  Thanks

## 2019-09-23 NOTE — Telephone Encounter (Signed)
Yes he can resume allergy shots whenever he would like to resume.  If he can come in this week or next week for injection then would resume at green vial 0.38ml on schedule A weekly build-up.

## 2019-09-23 NOTE — Telephone Encounter (Signed)
Informed patient. Addended last immunotherapy visit flow sheet.

## 2019-10-01 ENCOUNTER — Encounter: Payer: Self-pay | Admitting: Physician Assistant

## 2019-10-03 ENCOUNTER — Encounter: Payer: Self-pay | Admitting: Physician Assistant

## 2019-10-03 ENCOUNTER — Other Ambulatory Visit: Payer: Self-pay | Admitting: Physician Assistant

## 2019-10-03 ENCOUNTER — Other Ambulatory Visit: Payer: Self-pay

## 2019-10-03 ENCOUNTER — Ambulatory Visit (INDEPENDENT_AMBULATORY_CARE_PROVIDER_SITE_OTHER): Payer: 59 | Admitting: Physician Assistant

## 2019-10-03 VITALS — Temp 98.4°F

## 2019-10-03 DIAGNOSIS — J019 Acute sinusitis, unspecified: Secondary | ICD-10-CM

## 2019-10-03 DIAGNOSIS — B9689 Other specified bacterial agents as the cause of diseases classified elsewhere: Secondary | ICD-10-CM

## 2019-10-03 DIAGNOSIS — F419 Anxiety disorder, unspecified: Secondary | ICD-10-CM

## 2019-10-03 MED ORDER — DOXYCYCLINE HYCLATE 100 MG PO TABS: 100.0000 mg | ORAL_TABLET | Freq: Two times a day (BID) | ORAL | 0 refills | Status: DC

## 2019-10-03 NOTE — Patient Instructions (Signed)
Instructions sent to MyChart.  Please take antibiotic as directed.  Increase fluid intake.  Use Saline nasal spray.  Take a daily multivitamin. Continue Flonase. Start Claritin-D or Zyrtec-D. Consider getting some saline gel (Ayr is a good brand) to put just inside the nares.  Place a humidifier in the bedroom.  Please call or return clinic if symptoms are not improving.  Sinusitis Sinusitis is redness, soreness, and swelling (inflammation) of the paranasal sinuses. Paranasal sinuses are air pockets within the bones of your face (beneath the eyes, the middle of the forehead, or above the eyes). In healthy paranasal sinuses, mucus is able to drain out, and air is able to circulate through them by way of your nose. However, when your paranasal sinuses are inflamed, mucus and air can become trapped. This can allow bacteria and other germs to grow and cause infection. Sinusitis can develop quickly and last only a short time (acute) or continue over a long period (chronic). Sinusitis that lasts for more than 12 weeks is considered chronic.  CAUSES  Causes of sinusitis include:  Allergies.  Structural abnormalities, such as displacement of the cartilage that separates your nostrils (deviated septum), which can decrease the air flow through your nose and sinuses and affect sinus drainage.  Functional abnormalities, such as when the small hairs (cilia) that line your sinuses and help remove mucus do not work properly or are not present. SYMPTOMS  Symptoms of acute and chronic sinusitis are the same. The primary symptoms are pain and pressure around the affected sinuses. Other symptoms include:  Upper toothache.  Earache.  Headache.  Bad breath.  Decreased sense of smell and taste.  A cough, which worsens when you are lying flat.  Fatigue.  Fever.  Thick drainage from your nose, which often is green and may contain pus (purulent).  Swelling and warmth over the affected sinuses. DIAGNOSIS   Your caregiver will perform a physical exam. During the exam, your caregiver may:  Look in your nose for signs of abnormal growths in your nostrils (nasal polyps).  Tap over the affected sinus to check for signs of infection.  View the inside of your sinuses (endoscopy) with a special imaging device with a light attached (endoscope), which is inserted into your sinuses. If your caregiver suspects that you have chronic sinusitis, one or more of the following tests may be recommended:  Allergy tests.  Nasal culture A sample of mucus is taken from your nose and sent to a lab and screened for bacteria.  Nasal cytology A sample of mucus is taken from your nose and examined by your caregiver to determine if your sinusitis is related to an allergy. TREATMENT  Most cases of acute sinusitis are related to a viral infection and will resolve on their own within 10 days. Sometimes medicines are prescribed to help relieve symptoms (pain medicine, decongestants, nasal steroid sprays, or saline sprays).  However, for sinusitis related to a bacterial infection, your caregiver will prescribe antibiotic medicines. These are medicines that will help kill the bacteria causing the infection.  Rarely, sinusitis is caused by a fungal infection. In theses cases, your caregiver will prescribe antifungal medicine. For some cases of chronic sinusitis, surgery is needed. Generally, these are cases in which sinusitis recurs more than 3 times per year, despite other treatments. HOME CARE INSTRUCTIONS   Drink plenty of water. Water helps thin the mucus so your sinuses can drain more easily.  Use a humidifier.  Inhale steam 3 to 4 times  a day (for example, sit in the bathroom with the shower running).  Apply a warm, moist washcloth to your face 3 to 4 times a day, or as directed by your caregiver.  Use saline nasal sprays to help moisten and clean your sinuses.  Take over-the-counter or prescription medicines for  pain, discomfort, or fever only as directed by your caregiver. SEEK IMMEDIATE MEDICAL CARE IF:  You have increasing pain or severe headaches.  You have nausea, vomiting, or drowsiness.  You have swelling around your face.  You have vision problems.  You have a stiff neck.  You have difficulty breathing. MAKE SURE YOU:   Understand these instructions.  Will watch your condition.  Will get help right away if you are not doing well or get worse. Document Released: 09/11/2005 Document Revised: 12/04/2011 Document Reviewed: 09/26/2011 [facility] Patient Information 2014 [place], [place].

## 2019-10-03 NOTE — Progress Notes (Signed)
I have discussed the procedure for the virtual visit with the patient who has given consent to proceed with assessment and treatment.   Yajahira Tison S Loella Hickle, CMA     

## 2019-10-03 NOTE — Progress Notes (Signed)
   Virtual Visit via Video   I connected with patient on 10/03/19 at  9:00 AM EST by a video enabled telemedicine application and verified that I am speaking with the correct person using two identifiers.  Location patient: Home Location provider: [facility], Office Persons participating in the virtual visit: Patient, Provider, [place] (Patina Moore)  I discussed the limitations of evaluation and management by telemedicine and the availability of in person appointments. The patient expressed understanding and agreed to proceed.  Subjective:   HPI:   Patient presents via [facility] today maxillary sinus pressure and sinus pain with intermittent dental pain over the past couple of weeks. Denies fever, chills, chest congestion, cough or SOB. Has been using neti pot and antihistamine with limited improvement.   ROS:   See pertinent positives and negatives per HPI.  Patient Active Problem List   Diagnosis Date Noted  . 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 Hamstring injury, right, initial encounter 01/23/2019  . Migraine without aura and without status migrainosus, not intractable 07/10/2018  . Morbid obesity with BMI of 40.0-44.9, adult ([place]) 07/10/2018  . Allergic rhinitis 06/07/2018  . Mild intermittent asthma 06/07/2018  . Atypical chest pain 12/21/2017  . Generalized abdominal pain 12/17/2017  . Anxiety 11/28/2017    Social History   Tobacco Use  . Smoking status: Never Smoker  . Smokeless tobacco: Never Used  Substance Use Topics  . Alcohol use: Yes    Comment: Seldom    Current Outpatient Medications:  .  levocetirizine (XYZAL) 5 MG tablet, Take 5 mg by mouth every evening., Disp: , Rfl:   Allergies  Allergen Reactions  . Shellfish Allergy Anaphylaxis  . Hydrocodone Itching    Pt states he feels itchy after taking, but it doesn't preclude him taking hydrocodone if needed    Objective:   Temp 98.4 F (36.9 C) (Oral)   Patient is  well-developed, well-nourished in no acute distress.  Resting comfortably at home.  Head is normocephalic, atraumatic.  No labored breathing.  Speech is clear and coherent with logical content.  Patient is alert and oriented at baseline.   Assessment and Plan:   1. Acute bacterial sinusitis Rx Doxycycline.  Increase fluids.  Rest.  Saline nasal spray.  Probiotic.  Mucinex as directed.  Humidifier in bedroom. Claritin-D.  Call or return to clinic if symptoms are not improving.     Leeanne Rio, PA-C 10/03/2019

## 2019-10-03 NOTE — Telephone Encounter (Signed)
Last OV on 10/03/2019 Last reffil on 0903/2019+

## 2019-10-07 ENCOUNTER — Encounter: Payer: Self-pay | Admitting: Physician Assistant

## 2019-10-08 ENCOUNTER — Encounter: Payer: Self-pay | Admitting: Physician Assistant

## 2019-10-10 ENCOUNTER — Telehealth: Payer: Self-pay | Admitting: Physician Assistant

## 2019-10-10 NOTE — Telephone Encounter (Signed)
Can use OTC Opcon-A to help with allergy eye symptoms. Follow-up with Allergist as scheduled.

## 2019-10-10 NOTE — Telephone Encounter (Signed)
Patient has an upcoming appointment with Allergy and Asthma on 10/15/19. Please advise of any recommendations

## 2019-10-10 NOTE — Telephone Encounter (Signed)
Advised patient of PCP recommendations and his Allergist advised to switch up his antihistamines.

## 2019-10-10 NOTE — Telephone Encounter (Signed)
Pt completed antibiotics and is feeling better BUT, went to his eye doctor today for eye pain and was advised the eye pain is allergy related. Pt takes zyzal and flonase daily, looking for additional recommendations to ease his symptoms. [id number]

## 2019-10-14 ENCOUNTER — Encounter: Payer: Self-pay | Admitting: Physician Assistant

## 2019-10-15 ENCOUNTER — Ambulatory Visit: Payer: 59 | Admitting: Allergy

## 2019-10-15 ENCOUNTER — Encounter: Payer: Self-pay | Admitting: Allergy

## 2019-10-15 ENCOUNTER — Other Ambulatory Visit: Payer: Self-pay

## 2019-10-15 VITALS — BP 120/82 | HR 74 | Temp 98.4°F | Resp 16 | Ht 78.0 in | Wt 365.2 lb

## 2019-10-15 DIAGNOSIS — T7800XD Anaphylactic reaction due to unspecified food, subsequent encounter: Secondary | ICD-10-CM

## 2019-10-15 DIAGNOSIS — R0602 Shortness of breath: Secondary | ICD-10-CM | POA: Diagnosis not present

## 2019-10-15 DIAGNOSIS — J302 Other seasonal allergic rhinitis: Secondary | ICD-10-CM | POA: Insufficient documentation

## 2019-10-15 DIAGNOSIS — J3089 Other allergic rhinitis: Secondary | ICD-10-CM | POA: Diagnosis not present

## 2019-10-15 MED ORDER — AZELASTINE-FLUTICASONE 137-50 MCG/ACT NA SUSP: 1.0000 | Freq: Two times a day (BID) | NASAL | 5 refills | Status: DC

## 2019-10-15 NOTE — Assessment & Plan Note (Signed)
Past history - 2019 spirometry normal.   Using albuterol once a month for SOB.  May use albuterol rescue inhaler 2 puffs or nebulizer every 4 to 6 hours as needed for shortness of breath, chest tightness, coughing, and wheezing. May use albuterol rescue inhaler 2 puffs 5 to 15 minutes prior to strenuous physical activities. Monitor frequency of use.

## 2019-10-15 NOTE — Assessment & Plan Note (Addendum)
Past history -2019 blood work was positive to dust mites, cat, dog, grass, tree, borderline to ragweed, cockroach. Started AIT on 02/14/2018 (Mite-Cockroach-Ragweed & Pollen-Pet).  Interim history - missed injections due to recent surgery and sinus infection. Noticing some worsening symptoms. Used to be on Singulair in the past. No dust mite covers in place. Denies heartburn/reflux symptoms.   Continue allergen avoidance measures.  Get dust mites covers for the pillows.   Start taking allegra at night and may take twice a day if needed.  Start dymista 1 spray twice a day. This is a combination nasal spray and replaces Fluticasone.  If it's not covered let us know.   Nasal saline spray (i.e., Simply Saline) or nasal saline lavage (i.e., NeilMed) is recommended as needed and prior to medicated nasal sprays.  Recommend restarting allergy injections for long term treatment   Will need to back to previous vial due to missing injections. Check flow sheet or ask physician when he comes back for next injection.   If this does not work, then will start Singulair next and consider a trial of PPI.

## 2019-10-15 NOTE — Progress Notes (Signed)
Follow Up Note  RE: Bradley [name] MRN: [id number] DOB: Aug 27, 1996 Date of Office Visit: 10/15/2019  Referring provider: Brunetta Jeans, PA-C Primary care provider: Brunetta Jeans, PA-C  Chief Complaint: Other (sinus pressure, eyes hurting, clear mucous, sometimes thick white mucous, mostly in the morning) and Food Intolerance (shellfish, no accidental exposures)  History of Present Illness: I had the pleasure of seeing Bradley [name] for a follow up visit at the [facility] on 10/15/2019. He is a 24 y.o. male, who is being followed for food allergies and allergic rhinitis. His previous allergy office visit was on 02/07/2019 with Dr. Nelva Bush. Today is a new complaint visit of worsening allergy symptoms.   Allergic rhinitis Every morning wakes up with clearish/whitish mucous for 1 year. Denies GERD symptoms.  He also had some eye discomfort. Went to see his eye doctor and was prescribed a steroid eye drop.  His dentist noticed sinus infection and treated doxycycline for 5-7 days about 2 weeks ago.   Currently on allegra, Flonase 1-2 sprays daily, nettipot with some benefit.  Patient missed allergy injections due to shoulder injury and is overdue.  He thinks the allergy injections did help with his symptoms.   Patient used to be on Singulair in the past but stopped as he doesn't like to take too much medications.   Anaphylactic shock due to food Currently avoiding shellfish and no accidental ingestions.  SOB: Using albuterol once a month for shortness of breath.   Assessment and Plan: [name] is a 24 y.o. male with: Seasonal and perennial allergic rhinitis Past history -2019 blood work was positive to dust mites, cat, dog, grass, tree, borderline to ragweed, cockroach. Started AIT on 02/14/2018 (Mite-Cockroach-Ragweed & Pollen-Pet).  Interim history - missed injections due to recent surgery and sinus infection. Noticing some worsening symptoms. Used to be on  Singulair in the past. No dust mite covers in place. Denies heartburn/reflux symptoms.   Continue allergen avoidance measures.  Get dust mites covers for the pillows.   Start taking allegra at night and may take twice a day if needed.  Start dymista 1 spray twice a day. This is a combination nasal spray and replaces Fluticasone.  If it's not covered let us know.   Nasal saline spray (i.e., Simply Saline) or nasal saline lavage (i.e., NeilMed) is recommended as needed and prior to medicated nasal sprays.  Recommend restarting allergy injections for long term treatment   Will need to back to previous vial due to missing injections. Check flow sheet or ask physician when he comes back for next injection.   If this does not work, then will start Singulair next and consider a trial of PPI.   Anaphylactic shock due to adverse food reaction Past history - 2020 bloodwork positive to shellfish.  Continue strict avoidance of shellfish.  For mild symptoms you can take over the counter antihistamines such as Benadryl and monitor symptoms closely. If symptoms worsen or if you have severe symptoms including breathing issues, throat closure, significant swelling, whole body hives, severe diarrhea and vomiting, lightheadedness then inject epinephrine and seek immediate medical care afterwards.  Food action plan in place.   Shortness of breath Past history - 2019 spirometry normal.   Using albuterol once a month for SOB.  May use albuterol rescue inhaler 2 puffs or nebulizer every 4 to 6 hours as needed for shortness of breath, chest tightness, coughing, and wheezing. May use albuterol rescue inhaler 2 puffs 5  to 15 minutes prior to strenuous physical activities. Monitor frequency of use.    Return in about 6 months (around 04/13/2020).  Meds ordered this encounter  Medications  . Azelastine-Fluticasone 137-50 MCG/ACT SUSP    Sig: Place 1 spray into the nose 2 (two) times daily.    Dispense:   23 g    Refill:  5   Diagnostics: None.   Medication List:  Current Outpatient Medications  Medication Sig Dispense Refill  . clonazePAM (KLONOPIN) 0.5 MG disintegrating tablet TAKE 1 TABLET BY MOUTH TWICE A DAY AS NEEDED 30 tablet 0  . fexofenadine (ALLEGRA ALLERGY) 180 MG tablet Take 180 mg by mouth daily.    . Azelastine-Fluticasone 137-50 MCG/ACT SUSP Place 1 spray into the nose 2 (two) times daily. 23 g 5  . VENTOLIN HFA 108 (90 Base) MCG/ACT inhaler Inhale 2 puffs into the lungs every 4 (four) hours as needed.     No current facility-administered medications for this visit.   Allergies: Allergies  Allergen Reactions  . Shellfish Allergy Anaphylaxis  . Hydrocodone Itching    Pt states he feels itchy after taking, but it doesn't preclude him taking hydrocodone if needed   I reviewed his past medical history, social history, family history, and environmental history and no significant changes have been reported from his previous visit.  Review of Systems  Constitutional: Negative for appetite change, chills, fever and unexpected weight change.  HENT: Positive for congestion, postnasal drip, rhinorrhea and sinus pressure. Negative for sneezing.   Eyes: Negative for itching.  Respiratory: Negative for cough, chest tightness, shortness of breath and wheezing.   Gastrointestinal: Negative for abdominal pain.  Skin: Negative for rash.  Allergic/Immunologic: Positive for environmental allergies and food allergies.  Neurological: Negative for headaches.   Objective: BP 120/82 (BP Location: Left Arm, Patient Position: Sitting, Cuff Size: Large)   Pulse 74   Temp 98.4 F (36.9 C) (Temporal)   Resp 16   Ht 6\' 6"  (1.981 m)   Wt (!) 365 lb 3.2 oz (165.7 kg)   SpO2 98%   BMI 42.20 kg/m  Body mass index is 42.2 kg/m. Physical Exam  Constitutional: He is oriented to person, place, and time. He appears well-developed and well-nourished.  HENT:  Head: Normocephalic and  atraumatic.  Right Ear: External ear normal.  Left Ear: External ear normal.  Nose: Nose normal.  Mouth/Throat: Oropharynx is clear and moist.  Cobblestoning on the pharnyx  Eyes: Conjunctivae and EOM are normal.  Cardiovascular: Normal rate, regular rhythm and normal heart sounds. Exam reveals no gallop and no friction rub.  No murmur heard. Pulmonary/Chest: Effort normal and breath sounds normal. He has no wheezes. He has no rales.  Musculoskeletal:     Cervical back: Neck supple.  Neurological: He is alert and oriented to person, place, and time.  Skin: Skin is warm. No rash noted.  Psychiatric: He has a normal mood and affect. His behavior is normal.  Nursing note and vitals reviewed.  Previous notes and tests were reviewed. The plan was reviewed with the patient/family, and all questions/concerned were addressed.  It was my pleasure to see Derral today and participate in his care. Please feel free to contact me with any questions or concerns.  Sincerely,  Rexene Alberts, DO Allergy & Immunology  [facility] office: [phone] [facility] office: [place] office: [phone]

## 2019-10-15 NOTE — Patient Instructions (Addendum)
Allergic rhinitis  Continue allergen avoidance measures to dust mites, cat, dog, grass pollen, weed pollen, tree pollen, and cockroach.  Get dust mites covers for the pillows.   Start taking allegra at night and may take twice a day if needed.  Start dymista 1 spray twice a day. This is a combination nasal spray and replaces Fluticasone.  If it's not covered let us know.   Nasal saline spray (i.e., Simply Saline) or nasal saline lavage (i.e., NeilMed) is recommended as needed and prior to medicated nasal sprays.  Recommend restarting allergy injections for long term treatment.   If this does not work, then will start Singulair next.   Food allergy:  Continue strict avoidance of shellfish.  For mild symptoms you can take over the counter antihistamines such as Benadryl and monitor symptoms closely. If symptoms worsen or if you have severe symptoms including breathing issues, throat closure, significant swelling, whole body hives, severe diarrhea and vomiting, lightheadedness then inject epinephrine and seek immediate medical care afterwards.  Food action plan in place.   Follow up in 6 months or sooner if needed.   Control of House Dust Mite Allergen . Dust mite allergens are a common trigger of allergy and asthma symptoms. While they can be found throughout the house, these microscopic creatures thrive in warm, humid environments such as bedding, upholstered furniture and carpeting. . Because so much time is spent in the bedroom, it is essential to reduce mite levels there.  . Encase pillows, mattresses, and box springs in special allergen-proof fabric covers or airtight, zippered plastic covers.  . Bedding should be washed weekly in hot water (130 F) and dried in a hot dryer. Allergen-proof covers are available for comforters and pillows that can't be regularly washed.  Wendee Copp the allergy-proof covers every few months. Minimize clutter in the bedroom. Keep pets out of the bedroom.   Marland Kitchen Keep humidity less than 50% by using a dehumidifier or air conditioning. You can buy a humidity measuring device called a hygrometer to monitor this.  . If possible, replace carpets with hardwood, linoleum, or washable area rugs. If that's not possible, vacuum frequently with a vacuum that has a HEPA filter. . Remove all upholstered furniture and non-washable window drapes from the bedroom. . Remove all non-washable stuffed toys from the bedroom.  Wash stuffed toys weekly.  Reducing Pollen Exposure . Pollen seasons: trees (spring), grass (summer) and ragweed/weeds (fall). Marland Kitchen Keep windows closed in your home and car to lower pollen exposure.  Susa Simmonds air conditioning in the bedroom and throughout the house if possible.  . Avoid going out in dry windy days - especially early morning. . Pollen counts are highest between 5 - 10 AM and on dry, hot and windy days.  . Save outside activities for late afternoon or after a heavy rain, when pollen levels are lower.  . Avoid mowing of grass if you have grass pollen allergy. Marland Kitchen Be aware that pollen can also be transported indoors on people and pets.  . Dry your clothes in an automatic dryer rather than hanging them outside where they might collect pollen.  . Rinse hair and eyes before bedtime.  Pet Allergen Avoidance: . Contrary to popular opinion, there are no "hypoallergenic" breeds of dogs or cats. That is because people are not allergic to an animal's hair, but to an allergen found in the animal's saliva, dander (dead skin flakes) or urine. Pet allergy symptoms typically occur within minutes. For some people, symptoms  can build up and become most severe 8 to 12 hours after contact with the animal. People with severe allergies can experience reactions in public places if dander has been transported on the pet owners' clothing. Marland Kitchen Keeping an animal outdoors is only a partial solution, since homes with pets in the yard still have higher concentrations of  animal allergens. . Before getting a pet, ask your allergist to determine if you are allergic to animals. If your pet is already considered part of your family, try to minimize contact and keep the pet out of the bedroom and other rooms where you spend a great deal of time. . As with dust mites, vacuum carpets often or replace carpet with a hardwood floor, tile or linoleum. . High-efficiency particulate air (HEPA) cleaners can reduce allergen levels over time. . While dander and saliva are the source of cat and dog allergens, urine is the source of allergens from rabbits, hamsters, mice and [place] pigs; so ask a non-allergic family member to clean the animal's cage. . If you have a pet allergy, talk to your allergist about the potential for allergy immunotherapy (allergy shots). This strategy can often provide long-term relief.  Cockroach Allergen Avoidance Cockroaches are often found in the homes of densely populated urban areas, schools or commercial buildings, but these creatures can lurk almost anywhere. This does not mean that you have a dirty house or living area. . Block all areas where roaches can enter the home. This includes crevices, wall cracks and windows.  . Cockroaches need water to survive, so fix and seal all leaky faucets and pipes. Have an exterminator go through the house when your family and pets are gone to eliminate any remaining roaches. Marland Kitchen Keep food in lidded containers and put pet food dishes away after your pets are done eating. Vacuum and sweep the floor after meals, and take out garbage and recyclables. Use lidded garbage containers in the kitchen. Wash dishes immediately after use and clean under stoves, refrigerators or toasters where crumbs can accumulate. Wipe off the stove and other kitchen surfaces and cupboards regularly.

## 2019-10-15 NOTE — Assessment & Plan Note (Signed)
Past history - 2020 bloodwork positive to shellfish.  Continue strict avoidance of shellfish.  For mild symptoms you can take over the counter antihistamines such as Benadryl and monitor symptoms closely. If symptoms worsen or if you have severe symptoms including breathing issues, throat closure, significant swelling, whole body hives, severe diarrhea and vomiting, lightheadedness then inject epinephrine and seek immediate medical care afterwards.  Food action plan in place.  

## 2019-10-16 ENCOUNTER — Encounter (HOSPITAL_BASED_OUTPATIENT_CLINIC_OR_DEPARTMENT_OTHER): Payer: Self-pay

## 2019-10-16 ENCOUNTER — Emergency Department (HOSPITAL_BASED_OUTPATIENT_CLINIC_OR_DEPARTMENT_OTHER)
Admission: EM | Admit: 2019-10-16 | Discharge: 2019-10-16 | Disposition: A | Discharge status: Home / Self Care | Payer: 59 | Attending: Emergency Medicine | Admitting: Emergency Medicine

## 2019-10-16 ENCOUNTER — Emergency Department (HOSPITAL_BASED_OUTPATIENT_CLINIC_OR_DEPARTMENT_OTHER): Payer: 59

## 2019-10-16 ENCOUNTER — Other Ambulatory Visit: Payer: Self-pay

## 2019-10-16 DIAGNOSIS — R109 Unspecified abdominal pain: Secondary | ICD-10-CM

## 2019-10-16 DIAGNOSIS — R1012 Left upper quadrant pain: Secondary | ICD-10-CM | POA: Insufficient documentation

## 2019-10-16 LAB — CBC WITH DIFFERENTIAL/PLATELET
Abs Immature Granulocytes: 0.02 10*3/uL (ref 0.00–0.07)
Basophils Absolute: 0 10*3/uL (ref 0.0–0.1)
Basophils Relative: 0 %
Eosinophils Absolute: 0 10*3/uL (ref 0.0–0.5)
Eosinophils Relative: 0 %
HCT: 47.2 % (ref 39.0–52.0)
Hemoglobin: 15.5 g/dL (ref 13.0–17.0)
Immature Granulocytes: 0 %
Lymphocytes Relative: 19 %
Lymphs Abs: 1.7 10*3/uL (ref 0.7–4.0)
MCH: 30 pg (ref 26.0–34.0)
MCHC: 32.8 g/dL (ref 30.0–36.0)
MCV: 91.5 fL (ref 80.0–100.0)
Monocytes Absolute: 0.4 10*3/uL (ref 0.1–1.0)
Monocytes Relative: 5 %
Neutro Abs: 6.8 10*3/uL (ref 1.7–7.7)
Neutrophils Relative %: 76 %
Platelets: 224 10*3/uL (ref 150–400)
RBC: 5.16 MIL/uL (ref 4.22–5.81)
RDW: 12.1 % (ref 11.5–15.5)
WBC: 9 10*3/uL (ref 4.0–10.5)
nRBC: 0 % (ref 0.0–0.2)

## 2019-10-16 LAB — URINALYSIS, ROUTINE W REFLEX MICROSCOPIC
Bilirubin Urine: NEGATIVE
Glucose, UA: NEGATIVE mg/dL
Hgb urine dipstick: NEGATIVE
Ketones, ur: 15 mg/dL — AB
Leukocytes,Ua: NEGATIVE
Nitrite: NEGATIVE
Protein, ur: NEGATIVE mg/dL
Specific Gravity, Urine: 1.015 (ref 1.005–1.030)
pH: 6 (ref 5.0–8.0)

## 2019-10-16 LAB — COMPREHENSIVE METABOLIC PANEL
ALT: 25 U/L (ref 0–44)
AST: 17 U/L (ref 15–41)
Albumin: 4.7 g/dL (ref 3.5–5.0)
Alkaline Phosphatase: 61 U/L (ref 38–126)
Anion gap: 11 (ref 5–15)
BUN: 12 mg/dL (ref 6–20)
CO2: 23 mmol/L (ref 22–32)
Calcium: 8.9 mg/dL (ref 8.9–10.3)
Chloride: 101 mmol/L (ref 98–111)
Creatinine, Ser: 0.72 mg/dL (ref 0.61–1.24)
GFR calc Af Amer: >60 mL/min (ref >60)
GFR calc non Af Amer: >60 mL/min (ref >60)
Glucose, Bld: 90 mg/dL (ref 70–99)
Potassium: 3.7 mmol/L (ref 3.5–5.1)
Sodium: 135 mmol/L (ref 135–145)
Total Bilirubin: 0.8 mg/dL (ref 0.3–1.2)
Total Protein: 7.6 g/dL (ref 6.5–8.1)

## 2019-10-16 LAB — LIPASE, BLOOD: Lipase: 106 U/L — ABNORMAL HIGH (ref 11–51)

## 2019-10-16 MED ORDER — IOHEXOL 300 MG/ML  SOLN
100.0000 mL | Freq: Once | INTRAMUSCULAR | Status: AC
  Administered 2019-10-16: 100 mL via INTRAVENOUS

## 2019-10-16 NOTE — ED Triage Notes (Signed)
Pt c/o diffuse abd pain since yesterday. Pt states he accidentally started chewing on a toothpick and then vomited. Pt started has had pain since then.

## 2019-10-17 ENCOUNTER — Encounter: Payer: Self-pay | Admitting: Physician Assistant

## 2019-10-17 ENCOUNTER — Ambulatory Visit (INDEPENDENT_AMBULATORY_CARE_PROVIDER_SITE_OTHER): Payer: 59 | Admitting: *Deleted

## 2019-10-17 DIAGNOSIS — J309 Allergic rhinitis, unspecified: Secondary | ICD-10-CM

## 2019-10-17 NOTE — Progress Notes (Signed)
Immunotherapy   Patient Details  Name: Bradley [name] MRN: [id number] Date of Birth: 06/17/1996  10/17/2019  Bradley [name] started injections for  MITE-CR-RW, POLLEN-PET Following schedule: A  Frequency:1 time per week Epi-Pen: Yes Consent signed and patient instructions given. Patient restarted allergy injections today and per Dr. Maudie Mercury instructed to give the patient .90mL out of the Green vials to re-start injection. Patient received .74mL of MITE-CR-RW in the RUA and .66mL of POLLEN-PET in the LUA. Patient waited 30 minutes and did not experience any issues.    [name] 10/17/2019, 2:49 PM

## 2019-10-22 ENCOUNTER — Encounter: Payer: Self-pay | Admitting: Physician Assistant

## 2019-10-22 NOTE — ED Provider Notes (Signed)
[place] EMERGENCY DEPARTMENT Provider Note   CSN: [id number] Arrival date & time: 10/16/19  1629     History Chief Complaint  Patient presents with  . Abdominal Pain    Bradley [name] is a 24 y.o. male.  HPI   24 year old male with abdominal pain.  Pain is left upper quadrant.  Patient is concerned he missed yesterday he ate a sandwich did not realize that there was a toothpick in it.  He feels that he swallowed a toothpick.  Is very worried about this.  No vomiting or diarrhea.  No blood in the stool.  No acute respiratory complaints.  No urinary complaints.  Past Medical History:  Diagnosis Date  . Anxiety   . Chronic headaches   . Clavicular fracture   . History of chickenpox   . Mild intermittent asthma 06/07/2018  . Shingles    24 years old.    Patient Active Problem List   Diagnosis Date Noted  . Seasonal and perennial allergic rhinitis 10/15/2019  . Shortness of breath 10/15/2019  . 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 Hamstring injury, right, initial encounter 01/23/2019  . Migraine without aura and without status migrainosus, not intractable 07/10/2018  . Morbid obesity with BMI of 40.0-44.9, adult ([place]) 07/10/2018  . Allergic rhinitis 06/07/2018  . Mild intermittent asthma 06/07/2018  . Atypical chest pain 12/21/2017  . Generalized abdominal pain 12/17/2017  . Anxiety 11/28/2017    Past Surgical History:  Procedure Laterality Date  . CYSTECTOMY  01/2019   scrotum area   . shoulder surgery Right 08/26/2019  . WISDOM TOOTH EXTRACTION         Family History  Problem Relation Age of Onset  . Hypertension Mother   . Colon polyps Mother        might be mistakrn  . Diabetes Father        type II  . Migraines Sister   . Colon cancer Neg Hx   . Esophageal cancer Neg Hx   . Rectal cancer Neg Hx   . Stomach cancer Neg Hx     Social History   Tobacco Use  . Smoking status: Never Smoker  . Smokeless tobacco:  Never Used  Substance Use Topics  . Alcohol use: Yes    Comment: Seldom  . Drug use: Not Currently    Types: Marijuana    Comment: rare use    Home Medications Prior to Admission medications   Medication Sig Start Date End Date Taking? Authorizing Provider  Azelastine-Fluticasone 137-50 MCG/ACT SUSP Place 1 spray into the nose 2 (two) times daily. 10/15/19   Garnet Sierras, DO  clonazePAM (KLONOPIN) 0.5 MG disintegrating tablet TAKE 1 TABLET BY MOUTH TWICE A DAY AS NEEDED 10/03/19   Brunetta Jeans, PA-C  fexofenadine [facility] ALLERGY) 180 MG tablet Take 180 mg by mouth daily.    [provider]  VENTOLIN HFA 108 (90 Base) MCG/ACT inhaler Inhale 2 puffs into the lungs every 4 (four) hours as needed. 10/03/19   [provider]    Allergies    Shellfish allergy and Hydrocodone  Review of Systems   Review of Systems All systems reviewed and negative, other than as noted in HPI.  Physical Exam Updated Vital Signs BP 135/83 (BP Location: Left Arm)   Pulse 72   Temp 98.5 F (36.9 C) (Oral)   Resp 16   Ht 6\' 6"  (1.981 m)   Wt (!) 163.3 kg  SpO2 99%   BMI 41.60 kg/m   Physical Exam Vitals and nursing note reviewed.  Constitutional:      General: He is not in acute distress.    Appearance: He is well-developed.  HENT:     Head: Normocephalic and atraumatic.  Eyes:     General:        Right eye: No discharge.        Left eye: No discharge.     Conjunctiva/sclera: Conjunctivae normal.  Cardiovascular:     Rate and Rhythm: Normal rate and regular rhythm.     Heart sounds: Normal heart sounds. No murmur. No friction rub. No gallop.   Pulmonary:     Effort: Pulmonary effort is normal. No respiratory distress.     Breath sounds: Normal breath sounds.  Abdominal:     General: There is no distension.     Palpations: Abdomen is soft.     Tenderness: There is abdominal tenderness.     Comments: Tenderness in the upper abdomen, somewhat worse in the left upper  quadrant.  No rebound or guarding.  No distention.  Musculoskeletal:        General: No tenderness.     Cervical back: Neck supple.  Skin:    General: Skin is warm and dry.  Neurological:     Mental Status: He is alert.  Psychiatric:        Behavior: Behavior normal.        Thought Content: Thought content normal.     ED Results / Procedures / Treatments   Labs (all labs ordered are listed, but only abnormal results are displayed) Labs Reviewed  URINALYSIS, ROUTINE W REFLEX MICROSCOPIC - Abnormal; Notable for the following components:      Result Value   Ketones, ur 15 (*)    All other components within normal limits  LIPASE, BLOOD - Abnormal; Notable for the following components:   Lipase 106 (*)    All other components within normal limits  CBC WITH DIFFERENTIAL/PLATELET  COMPREHENSIVE METABOLIC PANEL    EKG None  Radiology No results found.   CT ABDOMEN PELVIS W CONTRAST  Result Date: 10/16/2019 CLINICAL DATA:  diffuse abd pain since yesterday. Pt states he accidentally started chewing on a toothpick and then vomited. Pt started has had pain since then Creat 0.72 GFR >60 EXAM: CT ABDOMEN AND PELVIS WITH CONTRAST TECHNIQUE: Multidetector CT imaging of the abdomen and pelvis was performed using the standard protocol following bolus administration of intravenous contrast. CONTRAST:  156mL OMNIPAQUE IOHEXOL 300 MG/ML  SOLN COMPARISON:  05/07/2019 FINDINGS: Lower chest: Clear lung bases. Hepatobiliary: No focal liver abnormality is seen. No gallstones, gallbladder wall thickening, or biliary dilatation. Pancreas: Unremarkable. No pancreatic ductal dilatation or surrounding inflammatory changes. Spleen: Normal in size without focal abnormality. Adrenals/Urinary Tract: No adrenal masses. Kidneys normal size, orientation and position. Small low-density left renal upper pole lesion measuring 1 cm consistent with a cyst. No other renal masses or lesions, no stones and no hydronephrosis.  Normal ureters. Normal bladder. Distal esophagus, stomach, small bowel and colon are within normal limits. Normal appendix visualized. No evidence of a bowel injury or of a radiopaque foreign body. Stomach/Bowel: Stomach is within normal limits. Appendix appears normal. No evidence of bowel wall thickening, distention, or inflammatory changes. No evidence of a bowel injury.  No radiopaque foreign body. Vascular/Lymphatic: No significant vascular findings are present. No enlarged abdominal or pelvic lymph nodes. Reproductive: Unremarkable. Other: No abdominal wall hernia or abnormality.  No abdominopelvic ascites. Musculoskeletal: No acute or significant osseous findings. IMPRESSION: 1. No acute findings. No bowel inflammation or evidence of bowel injury. No radiopaque foreign body. 2. Small low-density left upper pole renal mass consistent with a cyst. No other abnormality. Electronically Signed   By: Lajean Manes M.D.   On: 10/16/2019 18:29    Procedures Procedures (including critical care time)  Medications Ordered in ED Medications  iohexol (OMNIPAQUE) 300 MG/ML solution 100 mL (100 mLs Intravenous Contrast Given 10/16/19 1813)    ED Course  I have reviewed the triage vital signs and the nursing notes.  Pertinent labs & imaging results that were available during my care of the patient were reviewed by me and considered in my medical decision making (see chart for details).    MDM Rules/Calculators/A&P                      24 year old male with abdominal pain.  Reassuring imaging.  No evidence of perforation or other significant acute abnormality.  Return precautions were discussed.  Outpatient follow-up as needed otherwise. Final Clinical Impression(s) / ED Diagnoses Final diagnoses:  Abdominal pain, unspecified abdominal location    Rx / DC Orders ED Discharge Orders    None       Virgel Manifold, MD 10/22/19 [phone]

## 2019-10-24 ENCOUNTER — Encounter: Payer: Self-pay | Admitting: Physician Assistant

## 2019-10-24 ENCOUNTER — Emergency Department
Admission: EM | Admit: 2019-10-24 | Discharge: 2019-10-24 | Disposition: A | Discharge status: Home / Self Care | Payer: 59 | Source: Home / Self Care | Attending: Family Medicine | Admitting: Family Medicine

## 2019-10-24 ENCOUNTER — Other Ambulatory Visit: Payer: Self-pay

## 2019-10-24 DIAGNOSIS — Z0189 Encounter for other specified special examinations: Secondary | ICD-10-CM

## 2019-10-24 LAB — POCT URINALYSIS DIP (MANUAL ENTRY)
Bilirubin, UA: NEGATIVE
Blood, UA: NEGATIVE
Glucose, UA: NEGATIVE mg/dL
Ketones, POC UA: NEGATIVE mg/dL
Leukocytes, UA: NEGATIVE
Nitrite, UA: NEGATIVE
Protein Ur, POC: NEGATIVE mg/dL
Spec Grav, UA: 1.02 (ref 1.010–1.025)
Urobilinogen, UA: 0.2 E.U./dL
pH, UA: 7 (ref 5.0–8.0)

## 2019-10-24 NOTE — Telephone Encounter (Signed)
Please schedule patient for ER follow-up on Monday as he is having several questions from his ER visit and has not had a follow-up with [place].

## 2019-10-24 NOTE — Telephone Encounter (Signed)
Feel this is highly unnecessary -- I will check appropriate labs at time of follow-up. He is free to do as he wishes but there is potential of being exposed to [place] by unnecessarily going to an Urgent Care.

## 2019-10-24 NOTE — Telephone Encounter (Signed)
Pt stated he will go to urgent care for lab work  Since it may get results faster

## 2019-10-24 NOTE — ED Provider Notes (Signed)
[name]    CSN: [id number] Arrival date & time: 10/24/19  1550      History   Chief Complaint Chief Complaint  Patient presents with  . Follow-up    from ER visit.    HPI Bradley [name] is a 24 y.o. male.   Patient was seen at [facility] on 10/16/19 with abdominal pain.  CT abdomen/pelvis was negative.  Lab findings included ketonuria and mild elevation of lipase (106 U/L).  He was advised to follow-up with his PCP. Patient returns for followup of lab tests.  He is assymptomatic at present.  The history is provided by the patient.    Past Medical History:  Diagnosis Date  . Anxiety   . Chronic headaches   . Clavicular fracture   . History of chickenpox   . Mild intermittent asthma 06/07/2018  . Shingles    24 years old.    Patient Active Problem List   Diagnosis Date Noted  . Seasonal and perennial allergic rhinitis 10/15/2019  . Shortness of breath 10/15/2019  . 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 Hamstring injury, right, initial encounter 01/23/2019  . Migraine without aura and without status migrainosus, not intractable 07/10/2018  . Morbid obesity with BMI of 40.0-44.9, adult ([place]) 07/10/2018  . Allergic rhinitis 06/07/2018  . Mild intermittent asthma 06/07/2018  . Atypical chest pain 12/21/2017  . Generalized abdominal pain 12/17/2017  . Anxiety 11/28/2017    Past Surgical History:  Procedure Laterality Date  . CYSTECTOMY  01/2019   scrotum area   . shoulder surgery Right 08/26/2019  . WISDOM TOOTH EXTRACTION         Home Medications    Prior to Admission medications   Medication Sig Start Date End Date Taking? Authorizing Provider  clonazePAM (KLONOPIN) 0.5 MG disintegrating tablet TAKE 1 TABLET BY MOUTH TWICE A DAY AS NEEDED 10/03/19   Brunetta Jeans, PA-C  fexofenadine [facility] ALLERGY) 180 MG tablet Take 180 mg by mouth daily.    [provider]  VENTOLIN HFA 108 (90 Base) MCG/ACT  inhaler Inhale 2 puffs into the lungs every 4 (four) hours as needed. 10/03/19   [provider]    Family History Family History  Problem Relation Age of Onset  . Hypertension Mother   . Colon polyps Mother        might be mistakrn  . Diabetes Father        type II  . Migraines Sister   . Colon cancer Neg Hx   . Esophageal cancer Neg Hx   . Rectal cancer Neg Hx   . Stomach cancer Neg Hx     Social History Social History   Tobacco Use  . Smoking status: Never Smoker  . Smokeless tobacco: Never Used  Substance Use Topics  . Alcohol use: Yes    Comment: Seldom  . Drug use: Not Currently    Types: Marijuana    Comment: rare use     Allergies   Shellfish allergy and Hydrocodone   Review of Systems Review of Systems No sore throat No cough No pleuritic pain No wheezing No nasal congestion No post-nasal drainage No sinus pain/pressure No itchy/red eyes No earache No hemoptysis No SOB No fever/chills No nausea No vomiting No abdominal pain No diarrhea No urinary symptoms No skin rash No fatigue No myalgias No headache   Physical Exam Triage Vital Signs ED Triage Vitals  Enc Vitals Group  BP 10/24/19 1622 (!) 142/81     Pulse Rate 10/24/19 1622 69     Resp 10/24/19 1622 20     Temp 10/24/19 1622 98.7 F (37.1 C)     Temp Source 10/24/19 1622 Oral     SpO2 10/24/19 1622 99 %     Weight 10/24/19 1623 (!) 366 lb (166 kg)     Height 10/24/19 1623 6\' 6"  (1.981 m)     Head Circumference --      Peak Flow --      Pain Score 10/24/19 1623 0     Pain Loc --      Pain Edu? --      Excl. in [place]? --    No data found.  Updated Vital Signs BP (!) 142/81 (BP Location: Right Arm)   Pulse 69   Temp 98.7 F (37.1 C) (Oral)   Resp 20   Ht 6\' 6"  (1.981 m)   Wt (!) 166 kg   SpO2 99%   BMI 42.30 kg/m   Visual Acuity Right Eye Distance:   Left Eye Distance:   Bilateral Distance:    Right Eye Near:   Left Eye Near:    Bilateral Near:      Physical Exam Nursing notes and Vital Signs reviewed. Appearance:  Patient appears stated age, and in no acute distress.    Eyes:  Pupils are equal, round, and reactive to light and accomodation.  Extraocular movement is intact.  Conjunctivae are not inflamed   Pharynx:  Normal; moist mucous membranes  Neck:  Supple.  No adenopathy Lungs:  Clear to auscultation.  Breath sounds are equal.  Moving air well. Heart:  Regular rate and rhythm without murmurs, rubs, or gallops.  Abdomen:  Nontender without masses or hepatosplenomegaly.  Bowel sounds are present.  No CVA or flank tenderness.  Extremities:  No edema.  Skin:  No rash present.     UC Treatments / Results  Labs (all labs ordered are listed, but only abnormal results are displayed) Labs Reviewed  AMYLASE  LIPASE  POCT URINALYSIS DIP (MANUAL ENTRY) negative    EKG   Radiology No results found.  Procedures Procedures (including critical care time)  Medications Ordered in UC Medications - No data to display  Initial Impression / Assessment and Plan / UC Course  I have reviewed the triage vital signs and the nursing notes.  Pertinent labs & imaging results that were available during my care of the patient were reviewed by me and considered in my medical decision making (see chart for details).    Note normal repeat urinalysis. Amylase and lipase pending. Followup with Family Doctor in 3 days.   Final Clinical Impressions(s) / UC Diagnoses   Final diagnoses:  Encounter for laboratory test   Discharge Instructions   None    ED Prescriptions    None        Kandra Nicolas, MD 10/29/19 [phone]

## 2019-10-24 NOTE — ED Triage Notes (Signed)
Pt was seen at [name] last week, and ketones were high in urine, and lipase was high also.  Spoke with PCP, and pt wanted test results before he could be seen on Monday.

## 2019-10-25 ENCOUNTER — Telehealth: Payer: Self-pay

## 2019-10-25 LAB — LIPASE: Lipase: 27 U/L (ref 7–60)

## 2019-10-25 LAB — AMYLASE: Amylase: 69 U/L (ref 21–101)

## 2019-10-25 NOTE — Telephone Encounter (Signed)
Left VM for pt. Per Dr Assunta Found, call and inform all labs are normal.

## 2019-10-27 ENCOUNTER — Ambulatory Visit: Payer: 59 | Admitting: Physician Assistant

## 2019-10-27 ENCOUNTER — Other Ambulatory Visit: Payer: Self-pay

## 2019-10-27 ENCOUNTER — Encounter: Payer: Self-pay | Admitting: Physician Assistant

## 2019-10-27 VITALS — BP 110/80 | HR 80 | Temp 99.8°F | Resp 16 | Ht 78.0 in | Wt 368.0 lb

## 2019-10-27 DIAGNOSIS — R1012 Left upper quadrant pain: Secondary | ICD-10-CM

## 2019-10-27 NOTE — Progress Notes (Signed)
Patient presents to clinic today for ER follow-up. Patient presented to [facility] ER on 10/16/2019 with complaints of abdominal pain and vomiting after swallowing and regurgitating a toothpick. Was able to regurgitate the pick up. ER workup included examination revealing LUQ tenderness, labs showing mild elevation in lipase but otherwise unremarkable and CT abdomen/pelvis that was unremarkable for acute findings. Did not l renal pole cyst. Was discharged home to follow-up with PCP.       Since discharge patient endorses feeling well overall. Denies any overt abdominal pain. Does note occasional heartburn without nausea or vomiting. Denies significant NSAID use. Denies alcohol consumption. Was worried about his lipase level so was seen at urgent care last weekend with repeat UA (due to trace ketones) and lipase. Records reviewed in care everywhere. These, along with an amylase level were all normal. Notes he has seen his ENT due to potentially swallowing the toothpick. Had unremarkable examination. ENT was worried about reflux giving his symptoms and placed him on Omeprazole. He notes he has not taken this yet.    Past Medical History:  Diagnosis Date  . Anxiety   . Chronic headaches   . Clavicular fracture   . History of chickenpox   . Mild intermittent asthma 06/07/2018  . Shingles    24 years old.    Current Outpatient Medications on File Prior to Visit  Medication Sig Dispense Refill  . clonazePAM (KLONOPIN) 0.5 MG disintegrating tablet TAKE 1 TABLET BY MOUTH TWICE A DAY AS NEEDED 30 tablet 0  . fexofenadine (ALLEGRA ALLERGY) 180 MG tablet Take 180 mg by mouth daily.    . VENTOLIN HFA 108 (90 Base) MCG/ACT inhaler Inhale 2 puffs into the lungs every 4 (four) hours as needed.     No current facility-administered medications on file prior to visit.    Allergies  Allergen Reactions  . Shellfish Allergy Anaphylaxis  . Hydrocodone Itching    Pt states he feels itchy after taking, but it  doesn't preclude him taking hydrocodone if needed    Family History  Problem Relation Age of Onset  . Hypertension Mother   . Colon polyps Mother        might be mistakrn  . Diabetes Father        type II  . Migraines Sister   . Colon cancer Neg Hx   . Esophageal cancer Neg Hx   . Rectal cancer Neg Hx   . Stomach cancer Neg Hx     Social History   Socioeconomic History  . Marital status: Single    Spouse name: Not on file  . Number of children: 0  . Years of education: College  . Highest education level: Not on file  Occupational History  . Occupation: Sales executive  Tobacco Use  . Smoking status: Never Smoker  . Smokeless tobacco: Never Used  Substance and Sexual Activity  . Alcohol use: Yes    Comment: Seldom  . Drug use: Not Currently    Types: Marijuana    Comment: rare use  . Sexual activity: Yes    Partners: Female  Other Topics Concern  . Not on file  Social History Narrative   Lives alone   Caffeine use: 1 cup coffee per day   Right handed    Social Determinants of Health   Financial Resource Strain:   . Difficulty of Paying Living Expenses: Not on file  Food Insecurity:   . Worried About [employer] of  Food in the Last Year: Not on file  . Ran Out of Food in the Last Year: Not on file  Transportation Needs:   . Lack of Transportation (Medical): Not on file  . Lack of Transportation (Non-Medical): Not on file  Physical Activity:   . Days of Exercise per Week: Not on file  . Minutes of Exercise per Session: Not on file  Stress:   . Feeling of Stress : Not on file  Social Connections:   . Frequency of Communication with Friends and Family: Not on file  . Frequency of Social Gatherings with Friends and Family: Not on file  . Attends Religious Services: Not on file  . Active Member of Clubs or Organizations: Not on file  . Attends Archivist Meetings: Not on file  . Marital Status: Not on file   Review of Systems - See HPI.  All other  ROS are negative.  BP 110/80   Pulse 80   Temp 99.8 F (37.7 C) (Temporal)   Resp 16   Ht 6\' 6"  (1.981 m)   Wt (!) 368 lb (166.9 kg)   SpO2 99%   BMI 42.53 kg/m   Physical Exam Vitals reviewed.  Constitutional:      Appearance: Normal appearance.  HENT:     Head: Normocephalic and atraumatic.  Eyes:     Conjunctiva/sclera: Conjunctivae normal.  Pulmonary:     Effort: Pulmonary effort is normal.  Abdominal:     General: There is no distension.     Palpations: Abdomen is soft. There is no mass.     Tenderness: There is no abdominal tenderness. There is no guarding or rebound.     Hernia: No hernia is present.  Musculoskeletal:     Cervical back: Neck supple.  Neurological:     General: No focal deficit present.     Mental Status: He is alert.  Psychiatric:        Mood and Affect: Mood normal.     Recent Results (from the past 2160 hour(s))  POCT urinalysis dipstick (new)     Status: None   Collection Time: 07/30/19  4:59 PM  Result Value Ref Range   Color, UA yellow yellow   Clarity, UA clear clear   Glucose, UA negative negative mg/dL   Bilirubin, UA negative negative   Ketones, POC UA negative negative mg/dL   Spec Grav, UA 1.010 1.010 - 1.025   Blood, UA negative negative   pH, UA 6.0 5.0 - 8.0   Protein Ur, POC negative negative mg/dL   Urobilinogen, UA 0.2 0.2 or 1.0 E.U./dL   Nitrite, UA Negative Negative   Leukocytes, UA Negative Negative  POCT urinalysis dipstick (new)     Status: Abnormal   Collection Time: 08/15/19  3:32 PM  Result Value Ref Range   Color, UA yellow yellow   Clarity, UA clear clear   Glucose, UA negative negative mg/dL   Bilirubin, UA negative negative   Ketones, POC UA negative negative mg/dL   Spec Grav, UA >=1.030 (A) 1.010 - 1.025   Blood, UA negative negative   pH, UA 6.5 5.0 - 8.0   Protein Ur, POC negative negative mg/dL   Urobilinogen, UA 0.2 0.2 or 1.0 E.U./dL   Nitrite, UA Negative Negative   Leukocytes, UA  Negative Negative  Urine culture     Status: None   Collection Time: 08/15/19  3:45 PM   Specimen: Urine, Clean Catch  Result Value Ref Range  MICRO NUMBER: [id number]    SPECIMEN QUALITY: Adequate    Sample Source URINE    STATUS: FINAL    Result: No Growth   CBC with Differential     Status: None   Collection Time: 10/16/19  5:16 PM  Result Value Ref Range   WBC 9.0 4.0 - 10.5 K/uL   RBC 5.16 4.22 - 5.81 MIL/uL   Hemoglobin 15.5 13.0 - 17.0 g/dL   HCT 47.2 39.0 - 52.0 %   MCV 91.5 80.0 - 100.0 fL   MCH 30.0 26.0 - 34.0 pg   MCHC 32.8 30.0 - 36.0 g/dL   RDW 12.1 11.5 - 15.5 %   Platelets 224 150 - 400 K/uL   nRBC 0.0 0.0 - 0.2 %   Neutrophils Relative % 76 %   Neutro Abs 6.8 1.7 - 7.7 K/uL   Lymphocytes Relative 19 %   Lymphs Abs 1.7 0.7 - 4.0 K/uL   Monocytes Relative 5 %   Monocytes Absolute 0.4 0.1 - 1.0 K/uL   Eosinophils Relative 0 %   Eosinophils Absolute 0.0 0.0 - 0.5 K/uL   Basophils Relative 0 %   Basophils Absolute 0.0 0.0 - 0.1 K/uL   Immature Granulocytes 0 %   Abs Immature Granulocytes 0.02 0.00 - 0.07 K/uL    Comment: Performed at [facility], [address] [place], [place] [address]  Comprehensive metabolic panel     Status: None   Collection Time: 10/16/19  5:16 PM  Result Value Ref Range   Sodium 135 135 - 145 mmol/L   Potassium 3.7 3.5 - 5.1 mmol/L   Chloride 101 98 - 111 mmol/L   CO2 23 22 - 32 mmol/L   Glucose, Bld 90 70 - 99 mg/dL   BUN 12 6 - 20 mg/dL   Creatinine, Ser 0.72 0.61 - 1.24 mg/dL   Calcium 8.9 8.9 - 10.3 mg/dL   Total Protein 7.6 6.5 - 8.1 g/dL   Albumin 4.7 3.5 - 5.0 g/dL   AST 17 15 - 41 U/L   ALT 25 0 - 44 U/L   Alkaline Phosphatase 61 38 - 126 U/L   Total Bilirubin 0.8 0.3 - 1.2 mg/dL   GFR calc non Af Amer >60 >60 mL/min   GFR calc Af Amer >60 >60 mL/min   Anion gap 11 5 - 15    Comment: Performed at [facility], [address] [place], [place] [address]  Urinalysis, Routine w reflex microscopic      Status: Abnormal   Collection Time: 10/16/19  5:16 PM  Result Value Ref Range   Color, Urine YELLOW YELLOW   APPearance CLEAR CLEAR   Specific Gravity, Urine 1.015 1.005 - 1.030   pH 6.0 5.0 - 8.0   Glucose, UA NEGATIVE NEGATIVE mg/dL   Hgb urine dipstick NEGATIVE NEGATIVE   Bilirubin Urine NEGATIVE NEGATIVE   Ketones, ur 15 (A) NEGATIVE mg/dL   Protein, ur NEGATIVE NEGATIVE mg/dL   Nitrite NEGATIVE NEGATIVE   Leukocytes,Ua NEGATIVE NEGATIVE    Comment: Microscopic not done on urines with negative protein, blood, leukocytes, nitrite, or glucose < 500 mg/dL. Performed at [facility], [address] [place], [place] [address]   Lipase, blood     Status: Abnormal   Collection Time: 10/16/19  5:16 PM  Result Value Ref Range   Lipase 106 (H) 11 - 51 U/L    Comment: Performed at [facility], [address] [place], [place] [address]  POCT urinalysis dipstick (new)     Status: None   Collection Time: 10/24/19  4:27 PM  Result Value Ref Range   Color, UA yellow yellow   Clarity, UA clear clear   Glucose, UA negative negative mg/dL   Bilirubin, UA negative negative   Ketones, POC UA negative negative mg/dL   Spec Grav, UA 1.020 1.010 - 1.025   Blood, UA negative negative   pH, UA 7.0 5.0 - 8.0   Protein Ur, POC negative negative mg/dL   Urobilinogen, UA 0.2 0.2 or 1.0 E.U./dL   Nitrite, UA Negative Negative   Leukocytes, UA Negative Negative  Amylase     Status: None   Collection Time: 10/24/19  5:49 PM  Result Value Ref Range   Amylase 69 21 - 101 U/L  Lipase     Status: None   Collection Time: 10/24/19  5:49 PM  Result Value Ref Range   Lipase 27 7 - 60 U/L    Assessment/Plan: 1. LUQ pain Likely gastritis/muscular tension after retching to throw up pat of a tooth pick. Unremarkable ER visit. Lipase mildly elevated but negative CT. Repeat labs at urgent care all normal. Recommend he continue low fat, GERD diet. Take Omeprazole as directed by ENT  and follow-up with specialist as scheduled. Follow-up here for any recurrence of symptoms and in summer for CPE.  This visit occurred during the SARS-CoV-2 public health emergency.  Safety protocols were in place, including screening questions prior to the visit, additional usage of staff PPE, and extensive cleaning of exam room while observing appropriate contact time as indicated for disinfecting solutions.    Leeanne Rio, PA-C

## 2019-10-27 NOTE — Patient Instructions (Signed)
Please keep hydrated and keep a well-balanced diet.  Limit alcohol and processed foods.  Avoid late-night eating.   I am glad repeat labs were normal. We will keep an eye on this routinely at physicals, etc.   Start the Omeprazole as directed by ENT. Look at dietary recommendations below.  We will see you in summer -- May-July for your physical.  Consider getting the hibiclens like we discussed!   Food Choices for Gastroesophageal Reflux Disease, Adult When you have gastroesophageal reflux disease (GERD), the foods you eat and your eating habits are very important. Choosing the right foods can help ease your discomfort. Think about working with a nutrition specialist (dietitian) to help you make good choices. What are tips for following this plan?  Meals  Choose healthy foods that are low in fat, such as fruits, vegetables, whole grains, low-fat dairy products, and lean meat, fish, and poultry.  Eat small meals often instead of 3 large meals a day. Eat your meals slowly, and in a place where you are relaxed. Avoid bending over or lying down until 2-3 hours after eating.  Avoid eating meals 2-3 hours before bed.  Avoid drinking a lot of liquid with meals.  Cook foods using methods other than frying. Bake, grill, or broil food instead.  Avoid or limit: ? Chocolate. ? Peppermint or spearmint. ? Alcohol. ? Pepper. ? Black and decaffeinated coffee. ? Black and decaffeinated tea. ? Bubbly (carbonated) soft drinks. ? Caffeinated energy drinks and soft drinks.  Limit high-fat foods such as: ? Fatty meat or fried foods. ? Whole milk, cream, butter, or ice cream. ? Nuts and nut butters. ? Pastries, donuts, and sweets made with butter or shortening.  Avoid foods that cause symptoms. These foods may be different for everyone. Common foods that cause symptoms include: ? Tomatoes. ? Oranges, lemons, and limes. ? Peppers. ? Spicy food. ? Onions and  garlic. ? Vinegar. Lifestyle  Maintain a healthy weight. Ask your doctor what weight is healthy for you. If you need to lose weight, work with your doctor to do so safely.  Exercise for at least 30 minutes for 5 or more days each week, or as told by your doctor.  Wear loose-fitting clothes.  Do not smoke. If you need help quitting, ask your doctor.  Sleep with the head of your bed higher than your feet. Use a wedge under the mattress or blocks under the bed frame to raise the head of the bed. Summary  When you have gastroesophageal reflux disease (GERD), food and lifestyle choices are very important in easing your symptoms.  Eat small meals often instead of 3 large meals a day. Eat your meals slowly, and in a place where you are relaxed.  Limit high-fat foods such as fatty meat or fried foods.  Avoid bending over or lying down until 2-3 hours after eating.  Avoid peppermint and spearmint, caffeine, alcohol, and chocolate. This information is not intended to replace advice given to you by your health care provider. Make sure you discuss any questions you have with your health care provider. Document Revised: 01/02/2019 Document Reviewed: 10/17/2016 Elsevier Patient Education  [employer].

## 2019-10-29 ENCOUNTER — Encounter: Payer: Self-pay | Admitting: Physician Assistant

## 2019-10-29 NOTE — Progress Notes (Signed)
PATIENT: Bradley [name] DOB: September 29, 1995  REASON FOR VISIT: follow up HISTORY FROM: patient  HISTORY OF PRESENT ILLNESS: Today 10/30/19  Bradley [name] is a 24 year old male with history of headaches.  His headaches have been under good control, he is not on any preventive medications, he reports he was having about 1 mild headache a month, relieved with Tylenol.  He is currently being treated for sinus issues, presumable sinus infection.  He reports he had a return of headaches over the weekend, around the time he started treatment for sinus infection with Augmentin.  He describes the headache as mild, pain to the top of his head, between his eyes.  They are not as significant as his headaches prior.  He reports some slight sensitivity to light.  He takes Tylenol with good benefit.  He does mention that he used a Nettie pot with tap water, instead of distilled water, is concerned he might have a brain bacteria as result.  He denies any numbness or weakness in his arms or legs, trouble with balance, gait, slurred speech, blurry vision, or fever.  He has lately been having a lot of problems with his allergies.  Yesterday, he was switched to clindamycin, because he had GI upset with Augmentin.  He has continued to go about his daily activities.  He presents today for evaluation unaccompanied.  HISTORY 03/20/2019 SS: Bradley [name] is a 24 year old male with history of headaches.  He is currently on propanolol 20 mg twice a day.  He reports he is having on average 1 migraine per week.  The purpose of today's visit, he reports side effects of the propanolol.  He reports he does not feel like himself, has been worried that his heart rate may be getting too low.  Furthermore, he is concerned about mixing medication with alcohol. He is mostly interested in starting a CGRP medication for headache prevention.   03/04/2019 SS: Bradley [name] is a 24 year old male with history of headache.He had an abnormal MRI of the  brain in February 2019 showing an area of enhancement in the left parasagittal frontal lobe.A repeat MRI in July 2019 showed there is no longer any enhancement in this area. A small single focus of T2 signal abnormality is seen in the right parietal lobe and is nonspecific. He indicates that he is doing well. He is currently takingpropanolol 20 mg twice daily for his headaches. He was able to decrease his dose after improvement of his headaches. He indicates that he may have 1 headache per week if that. When he gets a headache he will take Tylenol for pain. At times he may have a sharp pain in his head that may come and go in a matter of seconds. He does report that he has noise sensitivity, loud noises sound very loud. He thinks this could be due to shooting guns, not using proper ear protection.He does not have any new problems or concerns. At his last visit in October 2019, he reports Dr. Jannifer Franklin told him no need to repeat MRI of the brain,since the area of concern had cleared.  REVIEW OF SYSTEMS: Out of a complete 14 system review of symptoms, the patient complains only of the following symptoms, and all other reviewed systems are negative.  Headache  ALLERGIES: Allergies  Allergen Reactions  . Shellfish Allergy Anaphylaxis  . Hydrocodone Itching    Pt states he feels itchy after taking, but it doesn't preclude him taking hydrocodone if needed    HOME MEDICATIONS:  Outpatient Medications Prior to Visit  Medication Sig Dispense Refill  . CLINDAMYCIN HCL PO Take 1 tablet by mouth 3 (three) times daily.    . clonazePAM (KLONOPIN) 0.5 MG disintegrating tablet TAKE 1 TABLET BY MOUTH TWICE A DAY AS NEEDED 30 tablet 0  . fexofenadine (ALLEGRA ALLERGY) 180 MG tablet Take 180 mg by mouth daily.    . VENTOLIN HFA 108 (90 Base) MCG/ACT inhaler Inhale 2 puffs into the lungs every 4 (four) hours as needed.     No facility-administered medications prior to visit.    PAST MEDICAL  HISTORY: Past Medical History:  Diagnosis Date  . Anxiety   . Chronic headaches   . Clavicular fracture   . History of chickenpox   . Mild intermittent asthma 06/07/2018  . Shingles    24 years old.    PAST SURGICAL HISTORY: Past Surgical History:  Procedure Laterality Date  . CYSTECTOMY  01/2019   scrotum area   . shoulder surgery Right 08/26/2019  . WISDOM TOOTH EXTRACTION      FAMILY HISTORY: Family History  Problem Relation Age of Onset  . Hypertension Mother   . Colon polyps Mother        might be mistakrn  . Diabetes Father        type II  . Migraines Sister   . Colon cancer Neg Hx   . Esophageal cancer Neg Hx   . Rectal cancer Neg Hx   . Stomach cancer Neg Hx     SOCIAL HISTORY: Social History   Socioeconomic History  . Marital status: Single    Spouse name: Not on file  . Number of children: 0  . Years of education: College  . Highest education level: Not on file  Occupational History  . Occupation: Sales executive  Tobacco Use  . Smoking status: Never Smoker  . Smokeless tobacco: Never Used  Substance and Sexual Activity  . Alcohol use: Yes    Comment: Seldom  . Drug use: Not Currently    Types: Marijuana    Comment: rare use  . Sexual activity: Yes    Partners: Female  Other Topics Concern  . Not on file  Social History Narrative   Lives alone   Caffeine use: 1 cup coffee per day   Right handed    Social Determinants of Health   Financial Resource Strain:   . Difficulty of Paying Living Expenses: Not on file  Food Insecurity:   . Worried About Charity fundraiser in the Last Year: Not on file  . Ran Out of Food in the Last Year: Not on file  Transportation Needs:   . Lack of Transportation (Medical): Not on file  . Lack of Transportation (Non-Medical): Not on file  Physical Activity:   . Days of Exercise per Week: Not on file  . Minutes of Exercise per Session: Not on file  Stress:   . Feeling of Stress : Not on file  Social  Connections:   . Frequency of Communication with Friends and Family: Not on file  . Frequency of Social Gatherings with Friends and Family: Not on file  . Attends Religious Services: Not on file  . Active Member of Clubs or Organizations: Not on file  . Attends Archivist Meetings: Not on file  . Marital Status: Not on file  Intimate Partner Violence:   . Fear of Current or Ex-Partner: Not on file  . Emotionally Abused: Not on file  . Physically  Abused: Not on file  . Sexually Abused: Not on file   PHYSICAL EXAM  Vitals:   10/30/19 0824  BP: 130/80  Pulse: 91  Temp: (!) 97.1 F (36.2 C)  TempSrc: Oral  Weight: (!) 367 lb (166.5 kg)  Height: 6\' 6"  (1.981 m)   Body mass index is 42.41 kg/m.  Generalized: Well developed, in no acute distress   Neurological examination  Mentation: Alert oriented to time, place, history taking. Follows all commands speech and language fluent Cranial nerve II-XII: Pupils were equal round reactive to light. Extraocular movements were full, visual field were full on confrontational test. Facial sensation and strength were normal. Head turning and shoulder shrug  were normal and symmetric. Motor: The motor testing reveals 5 over 5 strength of all 4 extremities. Good symmetric motor tone is noted throughout.  Sensory: Sensory testing is intact to soft touch on all 4 extremities. No evidence of extinction is noted.  Coordination: Cerebellar testing reveals good finger-nose-finger and heel-to-shin bilaterally.  Gait and station: Gait is normal. Tandem gait is normal.  Reflexes: Deep tendon reflexes are symmetric and normal bilaterally.   DIAGNOSTIC DATA (LABS, IMAGING, TESTING) - I reviewed patient records, labs, notes, testing and imaging myself where available.  Lab Results  Component Value Date   WBC 9.0 10/16/2019   HGB 15.5 10/16/2019   HCT 47.2 10/16/2019   MCV 91.5 10/16/2019   PLT 224 10/16/2019      Component Value Date/Time    NA 135 10/16/2019 1716   K 3.7 10/16/2019 1716   CL 101 10/16/2019 1716   CO2 23 10/16/2019 1716   GLUCOSE 90 10/16/2019 1716   BUN 12 10/16/2019 1716   CREATININE 0.72 10/16/2019 1716   CALCIUM 8.9 10/16/2019 1716   PROT 7.6 10/16/2019 1716   ALBUMIN 4.7 10/16/2019 1716   AST 17 10/16/2019 1716   ALT 25 10/16/2019 1716   ALKPHOS 61 10/16/2019 1716   BILITOT 0.8 10/16/2019 1716   GFRNONAA >60 10/16/2019 1716   GFRAA >60 10/16/2019 1716   No results found for: CHOL, HDL, LDLCALC, LDLDIRECT, TRIG, CHOLHDL Lab Results  Component Value Date   HGBA1C 4.9 07/01/2018   No results found for: VITAMINB12 Lab Results  Component Value Date   TSH 2.45 10/01/2018   ASSESSMENT AND PLAN 24 y.o. year old male  has a past medical history of Anxiety, Chronic headaches, Clavicular fracture, History of chickenpox, Mild intermittent asthma (06/07/2018), and Shingles. here with:  1.  Migraine headache, history of, not intractable 2.  Current sinus infection  I think the return of his headaches may be related to his current sinus issues, as he is being treated for a presumed sinus infection with clindamycin.  For the last several months, his headaches have been doing well, is not on any preventative medication.  I have encouraged him to try ibuprofen or Aleve for headaches.  Make sure he is resting, drinking plenty of water, eating healthy.  If the headaches do not improve, he will contact our office, and I suppose we could consider a 3-day Decadron taper, or restarting preventative medications.  At this time, the headaches are mild, relieved with OTC medication.  It seems very unlikely that use of the Nettie pot with tap water, is a cause for concern.  Of note, he does not feel these headaches, are of the same intensity as his migraines that he was being treated for before.  He has previously had abnormal MRI of the brain, showing  a cortical abnormality, that was absent on repeat in 2019.  This likely  meant the lesion was not a venous abnormality, as previously thought, could have been a transient cortical abnormality associated with migraines.  If his migraines return, we may consider a repeat MRI of the brain.  He wishes to follow-up with [place] on an as-needed basis.   I spent 15 minutes with the patient. 50% of this time was spent discussing his plan of care.   Butler Denmark, AGNP-C, DNP 10/30/2019, 9:27 AM [facility] [address] [place], [place] [address] [phone]

## 2019-10-30 ENCOUNTER — Encounter: Payer: Self-pay | Admitting: Neurology

## 2019-10-30 ENCOUNTER — Other Ambulatory Visit: Payer: Self-pay

## 2019-10-30 ENCOUNTER — Ambulatory Visit: Payer: 59 | Admitting: Neurology

## 2019-10-30 VITALS — BP 130/80 | HR 91 | Temp 97.1°F | Ht 78.0 in | Wt 367.0 lb

## 2019-10-30 DIAGNOSIS — G43009 Migraine without aura, not intractable, without status migrainosus: Secondary | ICD-10-CM | POA: Diagnosis not present

## 2019-10-30 NOTE — Progress Notes (Signed)
I have read the note, and I agree with the clinical assessment and plan.  Vayla Wilhelmi K Aiysha Jillson   

## 2019-10-30 NOTE — Patient Instructions (Signed)
I would keep an eye on your headaches, try taking Ibuprofen or Aleve for your headaches. If they continue, certainly let us know. Finish your antibiotic, for your sinus issues. Rest, drink plenty of water, eat healthy.

## 2019-11-03 ENCOUNTER — Encounter (INDEPENDENT_AMBULATORY_CARE_PROVIDER_SITE_OTHER): Payer: Self-pay | Admitting: Otolaryngology

## 2019-11-03 ENCOUNTER — Other Ambulatory Visit: Payer: Self-pay

## 2019-11-03 ENCOUNTER — Ambulatory Visit (INDEPENDENT_AMBULATORY_CARE_PROVIDER_SITE_OTHER): Payer: 59 | Admitting: Otolaryngology

## 2019-11-03 VITALS — Temp 98.2°F

## 2019-11-03 DIAGNOSIS — J310 Chronic rhinitis: Secondary | ICD-10-CM

## 2019-11-03 NOTE — Progress Notes (Signed)
HPI: Bradley [name] is a 24 y.o. male who presents for evaluation of nasal complaints following a self-administered Covid test in the right nostril.  He has noticed a little bit of blood. He is used saline nasal irrigation.  He has had some intermittent headaches but no yellow-green discharge from the nose. He has history of allergies and is seen by allergist next door. He also has history of migraine headaches.  Past Medical History:  Diagnosis Date  . Anxiety   . Chronic headaches   . Clavicular fracture   . History of chickenpox   . Mild intermittent asthma 06/07/2018  . Shingles    24 years old.   Past Surgical History:  Procedure Laterality Date  . CYSTECTOMY  01/2019   scrotum area   . shoulder surgery Right 08/26/2019  . WISDOM TOOTH EXTRACTION     Social History   Socioeconomic History  . Marital status: Single    Spouse name: Not on file  . Number of children: 0  . Years of education: College  . Highest education level: Not on file  Occupational History  . Occupation: Sales executive  Tobacco Use  . Smoking status: Never Smoker  . Smokeless tobacco: Never Used  Substance and Sexual Activity  . Alcohol use: Yes    Comment: Seldom  . Drug use: Not Currently    Types: Marijuana    Comment: rare use  . Sexual activity: Yes    Partners: Female  Other Topics Concern  . Not on file  Social History Narrative   Lives alone   Caffeine use: 1 cup coffee per day   Right handed    Social Determinants of Health   Financial Resource Strain:   . Difficulty of Paying Living Expenses: Not on file  Food Insecurity:   . Worried About Charity fundraiser in the Last Year: Not on file  . Ran Out of Food in the Last Year: Not on file  Transportation Needs:   . Lack of Transportation (Medical): Not on file  . Lack of Transportation (Non-Medical): Not on file  Physical Activity:   . Days of Exercise per Week: Not on file  . Minutes of Exercise per Session: Not on file   Stress:   . Feeling of Stress : Not on file  Social Connections:   . Frequency of Communication with Friends and Family: Not on file  . Frequency of Social Gatherings with Friends and Family: Not on file  . Attends Religious Services: Not on file  . Active Member of Clubs or Organizations: Not on file  . Attends Archivist Meetings: Not on file  . Marital Status: Not on file   Family History  Problem Relation Age of Onset  . Hypertension Mother   . Colon polyps Mother        might be mistakrn  . Diabetes Father        type II  . Migraines Sister   . Colon cancer Neg Hx   . Esophageal cancer Neg Hx   . Rectal cancer Neg Hx   . Stomach cancer Neg Hx    Allergies  Allergen Reactions  . Shellfish Allergy Anaphylaxis  . Hydrocodone Itching    Pt states he feels itchy after taking, but it doesn't preclude him taking hydrocodone if needed   Prior to Admission medications   Medication Sig Start Date End Date Taking? Authorizing Provider  CLINDAMYCIN HCL PO Take 1 tablet by mouth 3 (three)  times daily.   Yes [provider]  clonazePAM (KLONOPIN) 0.5 MG disintegrating tablet TAKE 1 TABLET BY MOUTH TWICE A DAY AS NEEDED 10/03/19  Yes Brunetta Jeans, PA-C  fexofenadine [facility] ALLERGY) 180 MG tablet Take 180 mg by mouth daily.   Yes [provider]  VENTOLIN HFA 108 (90 Base) MCG/ACT inhaler Inhale 2 puffs into the lungs every 4 (four) hours as needed. 10/03/19  Yes [provider]     Positive ROS: Otherwise negative  All other systems have been reviewed and were otherwise negative with the exception of those mentioned in the HPI and as above.  Physical Exam: Constitutional: Alert, well-appearing, no acute distress Ears: External ears without lesions or tenderness. Ear canals are clear bilaterally with intact, clear TMs.  Nasal: External nose without lesions. Septum with mild deformity.  Moderate rhinitis..  Both middle meatus regions are  clear.  Particularly on exam of the right side the middle turbinate septum and nasal cavity is clear.  No signs of infection.  No evidence of significant intranasal trauma. Oral: Lips and gums without lesions. Tongue and palate mucosa without lesions. Posterior oropharynx clear. Neck: No palpable adenopathy or masses Respiratory: Breathing comfortably  Skin: No facial/neck lesions or rash noted.  Procedures  Assessment: Chronic allergic rhinitis. Presently no clinical evidence of acute sinusitis.  Plan: Reassured patient of normal nasal examination.  We will continue with use of nasal steroid spray and prescribed Nasacort 2 sprays each nostril at night.  And use of saline rinse as needed. He will follow-up if symptoms worsen. Reassured him of no significant trauma from use of the self-administered Covid test.  Radene Journey, MD

## 2019-11-04 ENCOUNTER — Ambulatory Visit (INDEPENDENT_AMBULATORY_CARE_PROVIDER_SITE_OTHER): Payer: 59 | Admitting: Otolaryngology

## 2019-11-05 ENCOUNTER — Encounter: Payer: Self-pay | Admitting: Neurology

## 2019-11-11 ENCOUNTER — Ambulatory Visit (INDEPENDENT_AMBULATORY_CARE_PROVIDER_SITE_OTHER): Payer: 59

## 2019-11-11 DIAGNOSIS — J309 Allergic rhinitis, unspecified: Secondary | ICD-10-CM | POA: Diagnosis not present

## 2019-11-12 ENCOUNTER — Encounter: Payer: Self-pay | Admitting: Physician Assistant

## 2019-11-24 ENCOUNTER — Emergency Department (HOSPITAL_BASED_OUTPATIENT_CLINIC_OR_DEPARTMENT_OTHER): Payer: 59

## 2019-11-24 ENCOUNTER — Emergency Department (HOSPITAL_BASED_OUTPATIENT_CLINIC_OR_DEPARTMENT_OTHER)
Admission: EM | Admit: 2019-11-24 | Discharge: 2019-11-24 | Disposition: A | Discharge status: Home / Self Care | Payer: 59 | Attending: Emergency Medicine | Admitting: Emergency Medicine

## 2019-11-24 ENCOUNTER — Other Ambulatory Visit: Payer: Self-pay

## 2019-11-24 ENCOUNTER — Encounter (HOSPITAL_BASED_OUTPATIENT_CLINIC_OR_DEPARTMENT_OTHER): Payer: Self-pay | Admitting: *Deleted

## 2019-11-24 DIAGNOSIS — M79604 Pain in right leg: Secondary | ICD-10-CM | POA: Insufficient documentation

## 2019-11-24 DIAGNOSIS — Z79899 Other long term (current) drug therapy: Secondary | ICD-10-CM | POA: Diagnosis not present

## 2019-11-24 DIAGNOSIS — J45909 Unspecified asthma, uncomplicated: Secondary | ICD-10-CM | POA: Insufficient documentation

## 2019-11-24 NOTE — ED Triage Notes (Signed)
Right calf and foot tingling without injury x 1 day.

## 2019-11-24 NOTE — Discharge Instructions (Signed)
Your x-ray and ultrasound today were reassuring.  This could be musculoskeletal pain versus tendinitis.  You can take Tylenol or Ibuprofen as directed for pain. You can alternate Tylenol and Ibuprofen every 4 hours. If you take Tylenol at 1pm, then you can take Ibuprofen at 5pm. Then you can take Tylenol again at 9pm.   Follow-up with your primary care doctor.  Return the emergency department for any worsening pain, redness or swelling, fevers or any other worsening or concerning symptoms.

## 2019-11-24 NOTE — ED Provider Notes (Signed)
[facility] EMERGENCY DEPARTMENT Provider Note   CSN: [id number] Arrival date & time: 11/24/19  1749     History Chief Complaint  Patient presents with  . Leg Pain    Bradley [name] is a 24 y.o. male past medical history of anxiety, chronic headaches who presents for evaluation of right lower extremity pain that began today.  He reports that this morning, he had some pain in his right calf and states that he had some intermittent tingling sensation in his distal lower leg and foot.  He denies any preceding trauma, injury.  He states he has not had any overlying warmth, erythema.  He states that the tingling sensation is not there consistently and comes and goes.  He states he currently does not have any numbness.  He has been able to ambulate without any difficulty.  He states that that he does feel some pain when he flexes and extends his foot in his calf.  He went to urgent care who instructed him to come to ED for further evaluation. He denies any hormones recent immobilization, prior history of DVT/PE, recent surgery, leg swelling, or long travel.  The history is provided by the patient.       Past Medical History:  Diagnosis Date  . Anxiety   . Chronic headaches   . Clavicular fracture   . History of chickenpox   . Mild intermittent asthma 06/07/2018  . Shingles    24 years old.    Patient Active Problem List   Diagnosis Date Noted  . Seasonal and perennial allergic rhinitis 10/15/2019  . Shortness of breath 10/15/2019  . 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 Hamstring injury, right, initial encounter 01/23/2019  . Migraine without aura and without status migrainosus, not intractable 07/10/2018  . Morbid obesity with BMI of 40.0-44.9, adult ([place]) 07/10/2018  . Allergic rhinitis 06/07/2018  . Mild intermittent asthma 06/07/2018  . Atypical chest pain 12/21/2017  . Generalized abdominal pain 12/17/2017  . Anxiety 11/28/2017    Past  Surgical History:  Procedure Laterality Date  . CYSTECTOMY  01/2019   scrotum area   . shoulder surgery Right 08/26/2019  . WISDOM TOOTH EXTRACTION         Family History  Problem Relation Age of Onset  . Hypertension Mother   . Colon polyps Mother        might be mistakrn  . Diabetes Father        type II  . Migraines Sister   . Colon cancer Neg Hx   . Esophageal cancer Neg Hx   . Rectal cancer Neg Hx   . Stomach cancer Neg Hx     Social History   Tobacco Use  . Smoking status: Never Smoker  . Smokeless tobacco: Never Used  Substance Use Topics  . Alcohol use: Yes    Comment: Seldom  . Drug use: Not Currently    Types: Marijuana    Comment: rare use    Home Medications Prior to Admission medications   Medication Sig Start Date End Date Taking? Authorizing Provider  CLINDAMYCIN HCL PO Take 1 tablet by mouth 3 (three) times daily.    [provider]  clonazePAM (KLONOPIN) 0.5 MG disintegrating tablet TAKE 1 TABLET BY MOUTH TWICE A DAY AS NEEDED 10/03/19   Brunetta Jeans, PA-C  fexofenadine [facility] ALLERGY) 180 MG tablet Take 180 mg by mouth daily.    [provider]  VENTOLIN HFA 108 (  90 Base) MCG/ACT inhaler Inhale 2 puffs into the lungs every 4 (four) hours as needed. 10/03/19   [provider]    Allergies    Shellfish allergy and Hydrocodone  Review of Systems   Review of Systems  Cardiovascular: Negative for leg swelling.  Musculoskeletal:       Leg pain  Skin: Positive for color change.  Neurological: Positive for numbness (tingling). Negative for weakness.  All other systems reviewed and are negative.   Physical Exam Updated Vital Signs BP 130/82 (BP Location: Right Arm)   Pulse 76   Temp 98.2 F (36.8 C) (Oral)   Resp 19   Ht 6\' 6"  (1.981 m)   Wt (!) 158.8 kg   SpO2 99%   BMI 40.45 kg/m   Physical Exam Vitals and nursing note reviewed.  Constitutional:      Appearance: He is well-developed.  HENT:     Head:  Normocephalic and atraumatic.  Eyes:     General: No scleral icterus.       Right eye: No discharge.        Left eye: No discharge.     Conjunctiva/sclera: Conjunctivae normal.  Cardiovascular:     Pulses:          Dorsalis pedis pulses are 2+ on the right side and 2+ on the left side.  Pulmonary:     Effort: Pulmonary effort is normal.  Musculoskeletal:     Comments: BLE are symmetric in appearance without any overlying warmth, erythema, edema.  Palpation of the right calf.  No deformity or crepitus noted.  No bony tenderness noted.  No tenderness palpation of Achilles tendon.  Dorsiflexion and plantarflexion of right foot intact any difficulty.  Skin:    General: Skin is warm and dry.     Capillary Refill: Capillary refill takes less than 2 seconds.     Comments: Good distal cap refill.  RLE is not dusky in appearance or cool to touch.  Neurological:     Mental Status: He is alert.     Comments: Sensation intact along major nerve distributions of BLE  Psychiatric:        Speech: Speech normal.        Behavior: Behavior normal.     ED Results / Procedures / Treatments   Labs (all labs ordered are listed, but only abnormal results are displayed) Labs Reviewed - No data to display  EKG None  Radiology DG Tibia/Fibula Right  Result Date: 11/24/2019 CLINICAL DATA:  24 year old male with right lower extremity pain. EXAM: RIGHT TIBIA AND FIBULA - 2 VIEW COMPARISON:  Right knee radiograph dated 01/12/2019. FINDINGS: There is no evidence of fracture or other focal bone lesions. Soft tissues are unremarkable. IMPRESSION: Negative. Electronically Signed   By: Anner Crete M.D.   On: 11/24/2019 20:27   US Venous Img Lower Unilateral Right  Result Date: 11/24/2019 CLINICAL DATA:  Knee pain EXAM: Right LOWER EXTREMITY VENOUS DOPPLER ULTRASOUND TECHNIQUE: Gray-scale sonography with compression, as well as color and duplex ultrasound, were performed to evaluate the deep venous system(s)  from the level of the common femoral vein through the popliteal and proximal calf veins. COMPARISON:  None. FINDINGS: VENOUS Normal compressibility of the common femoral, superficial femoral, and popliteal veins. Peroneal vein is not well visualized. Visualized portions of profunda femoral vein and great saphenous vein unremarkable. No filling defects to suggest DVT on grayscale or color Doppler imaging. Doppler waveforms show normal direction of venous flow, normal respiratory  phasicity and response to augmentation. Limited views of the contralateral common femoral vein are unremarkable. OTHER None. Limitations: Limited visibility of peroneal vein IMPRESSION: No femoropopliteal DVT nor evidence of DVT within the visualized calf veins. Electronically Signed   By: Donavan Foil M.D.   On: 11/24/2019 20:06    Procedures Procedures (including critical care time)  Medications Ordered in ED Medications - No data to display  ED Course  I have reviewed the triage vital signs and the nursing notes.  Pertinent labs & imaging results that were available during my care of the patient were reviewed by me and considered in my medical decision making (see chart for details).    MDM Rules/Calculators/A&P                      24 year old male who presents for evaluation of right lower extremity pain that began today.  No trauma, injury.  DVT risk factors.  He does report some intermittent tingling sensation but states that he does not currently have any numbness.  He states that pain hurt worse more when he flex and extend his foot and he felt like that made the pain is Worse.  He went to urgent care him sent to the ED for further evaluation.  On initially arrival, he is afebrile, nontoxic-appearing.  Vital signs are stable.  He is neurovascularly intact.  Bilateral lower extremities are symmetric in appearance without any overlying warmth, erythema, edema.  He does have tenderness palpation of the right posterior  calf.  No bony tenderness.  Full range of motion without any difficulty.  Suspect musculoskeletal strain versus tendinitis.  Doubt DVT as exam does not look concerning but will plan for ultrasound for evaluation.  Additionally, will plan for x-ray to ensure no acute bony abnormality.  History/physical exam not concerning for infectious etiology, ischemic limb.  Ultrasound reviewed.  Limited visual visibility of the peroneal vein but otherwise no evidence of femoral-popliteal DVT or evidence of DVT within the visualized calf veins.  X-ray negative for any acute bony abnormality.  I discussed results with patient.  Encouraged at home supportive care measures, including NSAIDs.  Encourage patient to follow-up with his primary care doctor if no improvement in symptoms. At this time, patient exhibits no emergent life-threatening condition that require further evaluation in ED or admission. Patient had ample opportunity for questions and discussion. All patient's questions were answered with full understanding. Strict return precautions discussed. Patient expresses understanding and agreement to plan.   Portions of this note were generated with [employer]. Dictation errors may occur despite best attempts at proofreading.   Final Clinical Impression(s) / ED Diagnoses Final diagnoses:  Right leg pain    Rx / DC Orders ED Discharge Orders    None       Volanda Napoleon, PA-C 11/24/19 2352    Little, Wenda Overland, MD 11/27/19 [phone]

## 2019-11-26 ENCOUNTER — Encounter: Payer: Self-pay | Admitting: Gastroenterology

## 2019-11-26 ENCOUNTER — Other Ambulatory Visit: Payer: Self-pay

## 2019-11-26 ENCOUNTER — Ambulatory Visit: Payer: 59 | Admitting: Gastroenterology

## 2019-11-26 VITALS — BP 126/84 | HR 90 | Temp 98.2°F | Ht 78.0 in | Wt 360.1 lb

## 2019-11-26 DIAGNOSIS — Z86010 Personal history of colonic polyps: Secondary | ICD-10-CM | POA: Diagnosis not present

## 2019-11-26 DIAGNOSIS — R12 Heartburn: Secondary | ICD-10-CM

## 2019-11-26 DIAGNOSIS — K219 Gastro-esophageal reflux disease without esophagitis: Secondary | ICD-10-CM | POA: Diagnosis not present

## 2019-11-26 DIAGNOSIS — R748 Abnormal levels of other serum enzymes: Secondary | ICD-10-CM

## 2019-11-26 DIAGNOSIS — R0989 Other specified symptoms and signs involving the circulatory and respiratory systems: Secondary | ICD-10-CM

## 2019-11-26 NOTE — Progress Notes (Signed)
P  Chief Complaint:    Reflux symptoms  GI History: 24 year old male with a history of obesity, mild intermittent asthma, anxiety initially seen in the GI clinic in 11/2018 with c/o abdominal pain change in bowel habits. Symptoms have been essentially lifelong, increasing in frequency/severity over the last year or so.  Endorses intermittent abdominal cramping, generalized abdominal pain, nausea without emesis.  Pain described as dull but occasionally sharp spots of pain, lasting a few seconds.  These exacerbations can be anywhere in the abdomen, and are nonreproducible. Otherwise no fever, chills.  No hematochezia or melena.  Pain not associated with p.o. intake or BM.  Evaluation to date: -EGD (02/2019, Dr. Bryan Lemma): Normal with negative gastric/duodenal biopsies -Colonoscopy (02/2019, Dr. Bryan Lemma): 12 mm cecal polyp (TA), 4 mm sigmoid polyp (TA), otherwise normal with biopsies negative for MC, normal TI.  Repeat in 3 years -02/2019: Normal CBC and CMP -10/2018: Normal RUQ [place] (had RUQ pain at that time) -2019: Normal CBC, CMP, TSH, negative C. difficile, stool culture   HPI:     Patient is a 24 y.o. male presenting to the Gastroenterology Clinic for follow-up.  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  Had previously increased dietary fiber without appreciable improvement in bowel habits.  Started probiotic.  Trialed course of Levsin and recommended EGD/colonoscopy with biopsies due to lower GI symptoms and nausea at that time as well.  Since last appointment, seen in [facility] ER on 09/2019: -Normal CBC, UA, CMP,.  Lipase 106 -CT: Normal pancreas, liver, GB.  Renal cyst -Repeat labs on 10/24/2019: Normal lipase, amylase  Was seen by ENT for sinusitis and diagnosed with suspected reflux, started on omeprazole 40 mg daily 2 weeks ago. Index sxs of HB, regurgitation, globus sensation. Typically post prandial, but can occur at random through the day and early AM. No  nocturnal sxs. Strong gag reflex and regurgitaiton with brushing teeth recently.  Other GI symptoms have essentially resolved.    Review of systems:     No chest pain, no SOB, no fevers, no urinary sx   Past Medical History:  Diagnosis Date  . Anxiety   . Chronic headaches   . Clavicular fracture   . History of chickenpox   . Mild intermittent asthma 06/07/2018  . Shingles    24 years old.    Patient's surgical history, family medical history, social history, medications and allergies were all reviewed in Epic    Current Outpatient Medications  Medication Sig Dispense Refill  . clonazePAM (KLONOPIN) 0.5 MG disintegrating tablet TAKE 1 TABLET BY MOUTH TWICE A DAY AS NEEDED 30 tablet 0  . fexofenadine (ALLEGRA ALLERGY) 180 MG tablet Take 180 mg by mouth daily.    . VENTOLIN HFA 108 (90 Base) MCG/ACT inhaler Inhale 2 puffs into the lungs every 4 (four) hours as needed.     No current facility-administered medications for this visit.    Physical Exam:     BP 126/84   Pulse 90   Temp 98.2 F (36.8 C)   Ht 6\' 6"  (1.981 m)   Wt (!) 360 lb 2 oz (163.4 kg)   BMI 41.62 kg/m   GENERAL:  Pleasant male in NAD PSYCH: : Cooperative, normal affect EENT:  conjunctiva pink, mucous membranes moist, neck supple without masses CARDIAC:  RRR, no murmur heard, no peripheral edema PULM: Normal respiratory effort, lungs CTA bilaterally, no wheezing ABDOMEN:  Nondistended, soft, nontender. No obvious masses, no hepatomegaly,  normal bowel sounds SKIN:  turgor, no lesions seen Musculoskeletal:  Normal muscle tone, normal strength NEURO: Alert and oriented x 3, no focal neurologic deficits   IMPRESSION and PLAN:    1) Reflux 2) Heartburn 3) Globus sensation Clinical presentation seems most consistent with reflux.  EGD in 02/2019 with otherwise normal-appearing LES, Z-line. -Increase omeprazole to 40 mg bid x4 weeks for diagnostic/therapeutic intent, with plan to then titrate to lowest  effective dose, or potentially even off therapy as tolerated -Avoid eating close to bedtime, avoid overeating -Antireflux lifestyle/dietary modifications -If symptoms persist, may consider Bravo or pH/impedance study (off PPI)  4) History of colon polyps -Colonoscopy in 2020 with 12 mm TA -Repeat colonoscopy 2023 for ongoing polyp surveillance per guidelines  5) Elevated lipase -Incidentally noted mild elevation of lipase (under 6) during recent ER evaluation.  CT scan otherwise normal.  Liver enzymes otherwise normal.  Lipase normalized 9 days later.  Potentially local inflammatory effect from separate UGI issue.  No recurrence.  Monitor for now  RTC in 6 months or sooner as needed  I spent 30 minutes of time, including in depth chart review, independent review of results as outlined above, communicating results with the patient directly, face-to-face time with the patient, coordinating care, and ordering studies and medications as appropriate, and documentation.       Lavena Bullion ,DO, FACG 11/26/2019, 2:03 PM

## 2019-11-26 NOTE — Patient Instructions (Signed)
Return in 6 months  It was a pleasure to see you today!  Vito Cirigliano, D.O.

## 2019-11-28 ENCOUNTER — Encounter: Payer: Self-pay | Admitting: Physician Assistant

## 2019-11-28 ENCOUNTER — Other Ambulatory Visit: Payer: Self-pay

## 2019-11-28 ENCOUNTER — Ambulatory Visit (INDEPENDENT_AMBULATORY_CARE_PROVIDER_SITE_OTHER): Payer: 59 | Admitting: Physician Assistant

## 2019-11-28 DIAGNOSIS — F411 Generalized anxiety disorder: Secondary | ICD-10-CM | POA: Diagnosis not present

## 2019-11-28 DIAGNOSIS — L853 Xerosis cutis: Secondary | ICD-10-CM | POA: Diagnosis not present

## 2019-11-28 MED ORDER — BUSPIRONE HCL 5 MG PO TABS: 5.0000 mg | ORAL_TABLET | Freq: Two times a day (BID) | ORAL | 1 refills | Status: DC

## 2019-11-28 NOTE — Progress Notes (Signed)
I have discussed the procedure for the virtual visit with the patient who has given consent to proceed with assessment and treatment.   Falesha Schommer S Aleisha Paone, CMA     

## 2019-11-28 NOTE — Progress Notes (Signed)
Virtual Visit via Video   I connected with patient on 11/28/19 at  1:00 PM EST by a video enabled telemedicine application and verified that I am speaking with the correct person using two identifiers.  Location patient: Home Location provider: [facility], Office Persons participating in the virtual visit: Patient, Provider, [place] (Patina Moore)  I discussed the limitations of evaluation and management by telemedicine and the availability of in person appointments. The patient expressed understanding and agreed to proceed.  Subjective:   HPI:   Patient presents via phone today after a failed attempt at video visit today to discuss multiple items.   Patient would like to discuss starting a daily medication for anxiety. This subject has been discussed previously but patient did not feel he needed medication. Now noting increase in his generalized anxiety. No increase in panic attack. Take Clonazepam as needed which helps with panic but only uses a few times per month.  Denies any significant depressed mood or anhedonia.  Denies suicidal thought or ideation.  Wants to avoid typical antidepressant if possible.  States he is read of lot of the side effects and is just very hesitant.   Patient also notes dryness of his hands bilaterally over the past few months.  States this is very noticeable after shower.  Denies itching, burning or cracking of skin.  Is wondering if he just needs to moisturize them.  ROS:   See pertinent positives and negatives per HPI.  Patient Active Problem List   Diagnosis Date Noted  . Seasonal and perennial allergic rhinitis 10/15/2019  . Shortness of breath 10/15/2019  . 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 Hamstring injury, right, initial encounter 01/23/2019  . Migraine without aura and without status migrainosus, not intractable 07/10/2018  . Morbid obesity with BMI of 40.0-44.9, adult ([place]) 07/10/2018  . Allergic rhinitis  06/07/2018  . Mild intermittent asthma 06/07/2018  . Atypical chest pain 12/21/2017  . Generalized abdominal pain 12/17/2017  . Anxiety 11/28/2017    Social History   Tobacco Use  . Smoking status: Never Smoker  . Smokeless tobacco: Never Used  Substance Use Topics  . Alcohol use: Not Currently    Comment: Seldom    Current Outpatient Medications:  .  clonazePAM (KLONOPIN) 0.5 MG disintegrating tablet, TAKE 1 TABLET BY MOUTH TWICE A DAY AS NEEDED, Disp: 30 tablet, Rfl: 0 .  fexofenadine (ALLEGRA ALLERGY) 180 MG tablet, Take 180 mg by mouth daily., Disp: , Rfl:  .  VENTOLIN HFA 108 (90 Base) MCG/ACT inhaler, Inhale 2 puffs into the lungs every 4 (four) hours as needed., Disp: , Rfl:   Allergies  Allergen Reactions  . Shellfish Allergy Anaphylaxis  . Hydrocodone Itching    Pt states he feels itchy after taking, but it doesn't preclude him taking hydrocodone if needed    Objective:   There were no vitals taken for this visit.  Patient is well-developed, well-nourished in no acute distress.  Resting comfortably at home.  Head is normocephalic, atraumatic.  No labored breathing.  Speech is clear and coherent with logical content.  Patient is alert and oriented at baseline.     Assessment and Plan:   1. Generalized anxiety disorder Start patient on BuSpar 5 mg twice daily.  Continue Klonopin as needed.  Patient to consider counseling.  Follow-up in 3 to 4 weeks for reassessment.  He is to message me via MyChart in 2 weeks to give me an update on how things  are going. - busPIRone (BUSPAR) 5 MG tablet; Take 1 tablet (5 mg total) by mouth 2 (two) times daily.  Dispense: 60 tablet; Refill: 1  2. Xerosis of skin Patient has dryness of skin of hands secondary to frequent handwashing and hand sanitizer use.  Recommended nonscented, nondyed moisturizing lotion to apply twice daily.  He is to follow-up with me via MyChart if symptoms are not improving.    Leeanne Rio,  PA-C 11/28/2019

## 2019-12-01 ENCOUNTER — Ambulatory Visit (INDEPENDENT_AMBULATORY_CARE_PROVIDER_SITE_OTHER): Payer: 59

## 2019-12-01 DIAGNOSIS — J309 Allergic rhinitis, unspecified: Secondary | ICD-10-CM | POA: Diagnosis not present

## 2019-12-04 ENCOUNTER — Encounter: Payer: Self-pay | Admitting: Physician Assistant

## 2019-12-08 ENCOUNTER — Emergency Department
Admission: EM | Admit: 2019-12-08 | Discharge: 2019-12-08 | Disposition: A | Discharge status: Home / Self Care | Payer: 59 | Source: Home / Self Care

## 2019-12-08 ENCOUNTER — Other Ambulatory Visit: Payer: Self-pay

## 2019-12-08 DIAGNOSIS — S39012A Strain of muscle, fascia and tendon of lower back, initial encounter: Secondary | ICD-10-CM

## 2019-12-08 DIAGNOSIS — R1033 Periumbilical pain: Secondary | ICD-10-CM

## 2019-12-08 LAB — POCT URINALYSIS DIP (MANUAL ENTRY)
Bilirubin, UA: NEGATIVE
Blood, UA: NEGATIVE
Glucose, UA: NEGATIVE mg/dL
Ketones, POC UA: NEGATIVE mg/dL
Leukocytes, UA: NEGATIVE
Nitrite, UA: NEGATIVE
Protein Ur, POC: NEGATIVE mg/dL
Spec Grav, UA: 1.02 (ref 1.010–1.025)
Urobilinogen, UA: 0.2 E.U./dL
pH, UA: 7.5 (ref 5.0–8.0)

## 2019-12-08 NOTE — ED Provider Notes (Signed)
[name]    CSN: [id number] Arrival date & time: 12/08/19  1237      History   Chief Complaint Chief Complaint  Patient presents with  . Back Pain  . Abdominal Pain    HPI Bradley [name] is a 24 y.o. male.   HPI  Bradley [name] is a 24 y.o. male presenting to UC with c/o Right lower back pain and periumbilical pain.  Pain started a few days ago, aching and sore, worse with certain movements or in certain positions. Abdominal pain only occurs with movement, is not present at rest.  He has tried ibuprofen, which helps temporarily.  He notes he has started to run recently to help lose weight. No known injury. No weight lifting.  He does have a hx of kidney stones and wants to make sure that is not the cause of his current back pain. Per medical records, pt has a hx of chronic abdominal pain. He recently f/u with his GI specialist as well as went to the hospital for an abdominal CT, which was unremarkable.  Pt denies urinary symptoms. Denies fever, chills, n/v/d.   Past Medical History:  Diagnosis Date  . Anxiety   . Chronic headaches   . Clavicular fracture   . History of chickenpox   . Mild intermittent asthma 06/07/2018  . Shingles    24 years old.    Patient Active Problem List   Diagnosis Date Noted  . Seasonal and perennial allergic rhinitis 10/15/2019  . Shortness of breath 10/15/2019  . 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 Hamstring injury, right, initial encounter 01/23/2019  . Migraine without aura and without status migrainosus, not intractable 07/10/2018  . Morbid obesity with BMI of 40.0-44.9, adult ([place]) 07/10/2018  . Allergic rhinitis 06/07/2018  . Mild intermittent asthma 06/07/2018  . Atypical chest pain 12/21/2017  . Generalized abdominal pain 12/17/2017  . Anxiety 11/28/2017    Past Surgical History:  Procedure Laterality Date  . CYSTECTOMY  01/2019   scrotum area   . shoulder surgery Right 08/26/2019  .  WISDOM TOOTH EXTRACTION         Home Medications    Prior to Admission medications   Medication Sig Start Date End Date Taking? Authorizing Provider  busPIRone (BUSPAR) 5 MG tablet Take 1 tablet (5 mg total) by mouth 2 (two) times daily. 11/28/19   Brunetta Jeans, PA-C  clonazePAM (KLONOPIN) 0.5 MG disintegrating tablet TAKE 1 TABLET BY MOUTH TWICE A DAY AS NEEDED 10/03/19   Brunetta Jeans, PA-C  fexofenadine [facility] ALLERGY) 180 MG tablet Take 180 mg by mouth daily.    [provider]  VENTOLIN HFA 108 (90 Base) MCG/ACT inhaler Inhale 2 puffs into the lungs every 4 (four) hours as needed. 10/03/19   [provider]    Family History Family History  Problem Relation Age of Onset  . Hypertension Mother   . Colon polyps Mother        might be mistakrn  . Diabetes Father        type II  . Migraines Sister   . Colon cancer Neg Hx   . Esophageal cancer Neg Hx   . Rectal cancer Neg Hx   . Stomach cancer Neg Hx     Social History Social History   Tobacco Use  . Smoking status: Never Smoker  . Smokeless tobacco: Never Used  Substance Use Topics  . Alcohol use: Not Currently  Comment: Seldom  . Drug use: Yes    Types: Marijuana    Comment: rare use     Allergies   Shellfish allergy and Hydrocodone   Review of Systems Review of Systems  Constitutional: Negative for appetite change, chills, fatigue, fever and unexpected weight change.  Gastrointestinal: Positive for abdominal pain. Negative for diarrhea, nausea and vomiting.  Genitourinary: Negative for dysuria, flank pain, frequency and hematuria.  Musculoskeletal: Positive for back pain and myalgias.  Neurological: Negative for dizziness, light-headedness and headaches.  All other systems reviewed and are negative.    Physical Exam Triage Vital Signs ED Triage Vitals  Enc Vitals Group     BP 12/08/19 1254 130/85     Pulse Rate 12/08/19 1254 74     Resp 12/08/19 1254 16     Temp 12/08/19  1254 98.8 F (37.1 C)     Temp Source 12/08/19 1254 Oral     SpO2 12/08/19 1254 99 %     Weight --      Height --      Head Circumference --      Peak Flow --      Pain Score 12/08/19 1253 3     Pain Loc --      Pain Edu? --      Excl. in [place]? --    No data found.  Updated Vital Signs BP 130/85 (BP Location: Right Arm)   Pulse 74   Temp 98.8 F (37.1 C) (Oral)   Resp 16   SpO2 99%   Visual Acuity Right Eye Distance:   Left Eye Distance:   Bilateral Distance:    Right Eye Near:   Left Eye Near:    Bilateral Near:     Physical Exam Vitals and nursing note reviewed.  Constitutional:      Appearance: He is well-developed.  HENT:     Head: Normocephalic and atraumatic.  Cardiovascular:     Rate and Rhythm: Normal rate and regular rhythm.  Pulmonary:     Effort: Pulmonary effort is normal. No respiratory distress.     Breath sounds: Normal breath sounds.  Abdominal:     General: There is no distension.     Palpations: Abdomen is soft.     Tenderness: There is abdominal tenderness (mild diffuse, no rebound or guarding. no masses palpated.). There is no right CVA tenderness, left CVA tenderness, guarding or rebound. Negative signs include Murphy's sign and McBurney's sign.  Musculoskeletal:        General: Normal range of motion.     Cervical back: Normal and normal range of motion.     Thoracic back: Normal.     Lumbar back: Tenderness (mild, Right side ) present. No bony tenderness.  Skin:    General: Skin is warm and dry.  Neurological:     Mental Status: He is alert and oriented to person, place, and time.  Psychiatric:        Behavior: Behavior normal.      UC Treatments / Results  Labs (all labs ordered are listed, but only abnormal results are displayed) Labs Reviewed  POCT URINALYSIS DIP (MANUAL ENTRY)    EKG   Radiology No results found.  Procedures Procedures (including critical care time)  Medications Ordered in UC Medications - No data  to display  Initial Impression / Assessment and Plan / UC Course  I have reviewed the triage vital signs and the nursing notes.  Pertinent labs & imaging results that  were available during my care of the patient were reviewed by me and considered in my medical decision making (see chart for details).     No evidence of urgent or emergent process taking place at this time. Reassured normal urine, doubt kidney stone. Pain likely musculoskeletal related to new exercising/running routine.  Discussed conservative home treatments. F/u with PCP Discussed symptoms that warrant emergent care in the ED. AVS provided  Final Clinical Impressions(s) / UC Diagnoses   Final diagnoses:  Low back strain, initial encounter  Periumbilical pain     Discharge Instructions      You may take 500mg  acetaminophen every 4-6 hours or in combination with ibuprofen 400-600mg  every 6-8 hours as needed for pain and inflammation but due to your history of abdominal pain and acid reflux, try to limit how much anti-inflammatory medication you are taking. Be sure to take medication with food.    You may alternate cool and warm compresses on sore muscles and take warm baths to help ease sore/tight muscles.  Try to warm up before working out, light walking and/or jumping jacks and stretching, then cool down after working out by light walking to allow heart rate to go back to normal and light stretching to help prevent sore muscles. You may apply cool compresses to muscles that tend to get sore right after running to help prevent this.   Please follow up with family medicine as needed.  Call 911 or go to the hospital if your abdominal pain becomes more severe, vomiting, fever >100.4*F or other new concerning symptoms develop.     ED Prescriptions    None     PDMP not reviewed this encounter.   Noe Gens, PA-C 12/08/19 1401

## 2019-12-08 NOTE — ED Triage Notes (Signed)
Patient presents to Urgent Care with complaints of lower back pain and lower abdominal pain since a few days ago. Patient reports he has tried some ibuprofen and it helps some but the pain returns. Pt denies urinary sx, irregular BMs, or unprotected intercourse.

## 2019-12-08 NOTE — Discharge Instructions (Signed)
  You may take 500mg  acetaminophen every 4-6 hours or in combination with ibuprofen 400-600mg  every 6-8 hours as needed for pain and inflammation but due to your history of abdominal pain and acid reflux, try to limit how much anti-inflammatory medication you are taking. Be sure to take medication with food.    You may alternate cool and warm compresses on sore muscles and take warm baths to help ease sore/tight muscles.  Try to warm up before working out, light walking and/or jumping jacks and stretching, then cool down after working out by light walking to allow heart rate to go back to normal and light stretching to help prevent sore muscles. You may apply cool compresses to muscles that tend to get sore right after running to help prevent this.   Please follow up with family medicine as needed.  Call 911 or go to the hospital if your abdominal pain becomes more severe, vomiting, fever >100.4*F or other new concerning symptoms develop.

## 2019-12-10 ENCOUNTER — Encounter: Payer: Self-pay | Admitting: Physician Assistant

## 2019-12-11 ENCOUNTER — Other Ambulatory Visit: Payer: Self-pay

## 2019-12-11 ENCOUNTER — Ambulatory Visit (INDEPENDENT_AMBULATORY_CARE_PROVIDER_SITE_OTHER): Payer: 59 | Admitting: Otolaryngology

## 2019-12-11 VITALS — Temp 98.2°F

## 2019-12-11 DIAGNOSIS — J310 Chronic rhinitis: Secondary | ICD-10-CM

## 2019-12-11 DIAGNOSIS — J343 Hypertrophy of nasal turbinates: Secondary | ICD-10-CM | POA: Diagnosis not present

## 2019-12-11 DIAGNOSIS — K219 Gastro-esophageal reflux disease without esophagitis: Secondary | ICD-10-CM

## 2019-12-11 NOTE — Progress Notes (Signed)
HPI: Bradley [name] is a 24 y.o. male who returns today for evaluation of nasal sinus symptoms as well as history of GERD.  He had a CT scan of his sinuses that showed clear paranasal sinuses.  He was recommended a turbinate reduction to help with his breathing and postnasal drainage.  He presents here for second opinion.  He is breathing well today.  Congestion comes and goes. He also has chronic chronic globus symptoms and takes omeprazole but not regularly..  Past Medical History:  Diagnosis Date  . Anxiety   . Chronic headaches   . Clavicular fracture   . History of chickenpox   . Mild intermittent asthma 06/07/2018  . Shingles    24 years old.   Past Surgical History:  Procedure Laterality Date  . CYSTECTOMY  01/2019   scrotum area   . shoulder surgery Right 08/26/2019  . WISDOM TOOTH EXTRACTION     Social History   Socioeconomic History  . Marital status: Single    Spouse name: Not on file  . Number of children: 0  . Years of education: College  . Highest education level: Not on file  Occupational History  . Occupation: Sales executive  Tobacco Use  . Smoking status: Never Smoker  . Smokeless tobacco: Never Used  Substance and Sexual Activity  . Alcohol use: Not Currently    Comment: Seldom  . Drug use: Yes    Types: Marijuana    Comment: rare use  . Sexual activity: Yes    Partners: Female  Other Topics Concern  . Not on file  Social History Narrative   Lives alone   Caffeine use: 1 cup coffee per day   Right handed    Social Determinants of Health   Financial Resource Strain:   . Difficulty of Paying Living Expenses:   Food Insecurity:   . Worried About Charity fundraiser in the Last Year:   . Arboriculturist in the Last Year:   Transportation Needs:   . Film/video editor (Medical):   Marland Kitchen Lack of Transportation (Non-Medical):   Physical Activity:   . Days of Exercise per Week:   . Minutes of Exercise per Session:   Stress:   . Feeling of Stress :    Social Connections:   . Frequency of Communication with Friends and Family:   . Frequency of Social Gatherings with Friends and Family:   . Attends Religious Services:   . Active Member of Clubs or Organizations:   . Attends Archivist Meetings:   Marland Kitchen Marital Status:    Family History  Problem Relation Age of Onset  . Hypertension Mother   . Colon polyps Mother        might be mistakrn  . Diabetes Father        type II  . Migraines Sister   . Colon cancer Neg Hx   . Esophageal cancer Neg Hx   . Rectal cancer Neg Hx   . Stomach cancer Neg Hx    Allergies  Allergen Reactions  . Shellfish Allergy Anaphylaxis  . Hydrocodone Itching    Pt states he feels itchy after taking, but it doesn't preclude him taking hydrocodone if needed   Prior to Admission medications   Medication Sig Start Date End Date Taking? Authorizing Provider  busPIRone (BUSPAR) 5 MG tablet Take 1 tablet (5 mg total) by mouth 2 (two) times daily. 11/28/19   Brunetta Jeans, PA-C  clonazePAM [facility])  0.5 MG disintegrating tablet TAKE 1 TABLET BY MOUTH TWICE A DAY AS NEEDED 10/03/19   Brunetta Jeans, PA-C  fexofenadine [facility] ALLERGY) 180 MG tablet Take 180 mg by mouth daily.    [provider]  VENTOLIN HFA 108 (90 Base) MCG/ACT inhaler Inhale 2 puffs into the lungs every 4 (four) hours as needed. 10/03/19   [provider]     Positive ROS: Otherwise negative  All other systems have been reviewed and were otherwise negative with the exception of those mentioned in the HPI and as above.  Physical Exam: Constitutional: Alert, well-appearing, no acute distress Ears: External ears without lesions or tenderness. Ear canals are clear bilaterally with intact, clear TMs.  Nasal: External nose without lesions. Septum slightly deviated to the left with moderate rhinitis.  Right large turbinate left moderate large turbinates.  Both middle meatus regions are clear.  No polyps.. Oral: Lips  and gums without lesions. Tongue and palate mucosa without lesions. Posterior oropharynx clear.  Tonsils symmetric in appearance 2+. Indirect laryngoscopy revealed clear base of tongue vallecula and epiglottis vocal cords were clear.  He had mild arytenoid edema but clear piriform sinuses. Neck: No palpable adenopathy or masses Respiratory: Breathing comfortably  Skin: No facial/neck lesions or rash noted.  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 He can occasionally use Afrin but no more than 2 or 3 times a week. Also recommended use of saline rinse. Recommended taking omeprazole daily for the next 4 to 6 weeks before dinner. Concerning turbinate reductions which was recommended he could have this performed and would probably breathe better but he really is not interested in surgery at this time.   Radene Journey, MD

## 2019-12-12 ENCOUNTER — Other Ambulatory Visit: Payer: Self-pay

## 2019-12-12 ENCOUNTER — Encounter: Payer: Self-pay | Admitting: Physician Assistant

## 2019-12-12 ENCOUNTER — Telehealth (INDEPENDENT_AMBULATORY_CARE_PROVIDER_SITE_OTHER): Payer: 59 | Admitting: Physician Assistant

## 2019-12-12 DIAGNOSIS — R109 Unspecified abdominal pain: Secondary | ICD-10-CM | POA: Diagnosis not present

## 2019-12-12 NOTE — Progress Notes (Signed)
I have discussed the procedure for the virtual visit with the patient who has given consent to proceed with assessment and treatment.   Bradley [name], CMA     

## 2019-12-12 NOTE — Progress Notes (Signed)
Virtual Visit via Video   I connected with patient on 12/12/19 at 11:00 AM EDT by a video enabled telemedicine application and verified that I am speaking with the correct person using two identifiers.  Location patient: Home Location provider: [facility] Office Persons participating in the virtual visit: Patient, Provider, [place] (Patina Moore)  I discussed the limitations of evaluation and management by telemedicine and the availability of in person appointments. The patient expressed understanding and agreed to proceed.  Subjective:   HPI:   Patient presents via [facility] complaining of 8 days of right flank and lower back pain associated with occasional right lower quadrant pain.  Patient denies any trauma or injury.  Has recently started a new exercise routine once he is mainly running) which initially thought could be the cause of symptoms.  Denies any pain with range of motion or change of position.  Denies any tenderness to palpation.  Was initially seen on 12/08/2019 [facility] urgent care.  At that time it was thought symptoms could be musculoskeletal.  Was given supportive measures to follow but notes no improvement in symptoms.  Was seen at separate urgent care yesterday for similar symptoms at which time UA was obtained.  This was noted to be negative.  Labs including CBC and CMP were obtained at that time but results are pending.  Urine culture also sent but still pending.  Patient is concerned about potential for kidney stone.  Notes history of these and states it feels very similar.  Does note some urinary frequency without significant dysuria or any hematuria.  Denies fever, chills, nausea or vomiting.  ROS:   See pertinent positives and negatives per HPI.  Patient Active Problem List   Diagnosis Date Noted  . Seasonal and perennial allergic rhinitis 10/15/2019  . Shortness of breath 10/15/2019  . 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  Hamstring injury, right, initial encounter 01/23/2019  . Migraine without aura and without status migrainosus, not intractable 07/10/2018  . Morbid obesity with BMI of 40.0-44.9, adult ([place]) 07/10/2018  . Allergic rhinitis 06/07/2018  . Mild intermittent asthma 06/07/2018  . Atypical chest pain 12/21/2017  . Generalized abdominal pain 12/17/2017  . Anxiety 11/28/2017    Social History   Tobacco Use  . Smoking status: Never Smoker  . Smokeless tobacco: Never Used  Substance Use Topics  . Alcohol use: Not Currently    Comment: Seldom    Current Outpatient Medications:  .  busPIRone (BUSPAR) 5 MG tablet, Take 1 tablet (5 mg total) by mouth 2 (two) times daily., Disp: 60 tablet, Rfl: 1 .  clonazePAM (KLONOPIN) 0.5 MG disintegrating tablet, TAKE 1 TABLET BY MOUTH TWICE A DAY AS NEEDED, Disp: 30 tablet, Rfl: 0 .  fexofenadine ([facility] ALLERGY) 180 MG tablet, Take 180 mg by mouth daily., Disp: , Rfl:  .  VENTOLIN HFA 108 (90 Base) MCG/ACT inhaler, Inhale 2 puffs into the lungs every 4 (four) hours as needed., Disp: , Rfl:   Allergies  Allergen Reactions  . Shellfish Allergy Anaphylaxis  . Hydrocodone Itching    Pt states he feels itchy after taking, but it doesn't preclude him taking hydrocodone if needed    Objective:   There were no vitals taken for this visit.  Patient is well-developed, well-nourished in no acute distress.  Resting comfortably at home.  Head is normocephalic, atraumatic.  No labored breathing.  Speech is clear and coherent with logical content.  Patient is alert and oriented at  baseline.   Assessment and Plan:   1. Right flank pain Unclear etiology.  Although initially musculoskeletal cause considered, giving no improvement with rest and no positional component of symptoms, do feel we need to assess for other causes.  Patient with history of nephrolithiasis.  Labs/urine culture from urgent care are still pending.  Giving patient's concerns and level of  discomfort we will proceed with CT renal stone study to further assess.  Increase fluids.  Rest.  OTC analgesics reviewed.  Strict ER precautions reviewed with patient. - CT RENAL STONE STUDY; Future    Leeanne Rio, PA-C 12/12/2019

## 2019-12-15 ENCOUNTER — Ambulatory Visit
Admission: RE | Admit: 2019-12-15 | Discharge: 2019-12-15 | Disposition: A | Discharge status: Home / Self Care | Payer: 59 | Source: Ambulatory Visit | Attending: Physician Assistant | Admitting: Physician Assistant

## 2019-12-15 DIAGNOSIS — R109 Unspecified abdominal pain: Secondary | ICD-10-CM

## 2019-12-15 NOTE — Telephone Encounter (Signed)
Can you check status of his imaging? Thank you.

## 2019-12-16 ENCOUNTER — Other Ambulatory Visit: Payer: Self-pay | Admitting: Physician Assistant

## 2019-12-16 DIAGNOSIS — F419 Anxiety disorder, unspecified: Secondary | ICD-10-CM

## 2019-12-16 NOTE — Telephone Encounter (Signed)
Clonazepam last rx 10/03/19 #30 LOV: 12/12/19

## 2019-12-18 ENCOUNTER — Encounter: Payer: Self-pay | Admitting: Family Medicine

## 2019-12-18 ENCOUNTER — Ambulatory Visit: Payer: 59 | Admitting: Family Medicine

## 2019-12-18 ENCOUNTER — Other Ambulatory Visit: Payer: Self-pay

## 2019-12-18 VITALS — BP 120/82 | HR 70 | Ht 78.0 in | Wt 354.0 lb

## 2019-12-18 DIAGNOSIS — M545 Low back pain, unspecified: Secondary | ICD-10-CM | POA: Insufficient documentation

## 2019-12-18 DIAGNOSIS — Z6841 Body Mass Index (BMI) 40.0 and over, adult: Secondary | ICD-10-CM | POA: Diagnosis not present

## 2019-12-18 DIAGNOSIS — G8929 Other chronic pain: Secondary | ICD-10-CM | POA: Diagnosis not present

## 2019-12-18 MED ORDER — TIZANIDINE HCL 4 MG PO CAPS: ORAL_CAPSULE | ORAL | 0 refills | Status: DC

## 2019-12-18 MED ORDER — MELOXICAM 15 MG PO TABS: 15.0000 mg | ORAL_TABLET | Freq: Every day | ORAL | 0 refills | Status: DC

## 2019-12-18 NOTE — Patient Instructions (Addendum)
Good to see you Exercise 3 times a week Meloxicam 15 mg daily for the next 10 days Zanaflex next 3 nights Send over xrays CT scan looks good no fracture 2 weeks off from running Run 3 times a week max with rest day inbetween See me again in 5-6 weeks

## 2019-12-18 NOTE — Assessment & Plan Note (Signed)
Midline tenderness.  This is a undiagnosed new problem.  Uncertain prognosis.  Patient did have a CT scan for renal stone that was unremarkable when I review it independently do not see any significant arthritic changes.  Do not feel with it being midline and no radicular symptoms at other advanced imaging is warranted at this time.  Meloxicam, Zanaflex, prescribed today.  Home exercises given and work with Product/process development scientist.  Discussed with patient high impact exercises secondary to patient's body habitus may not be the best and we discussed augmenting certain workouts.  Awaiting other x-rays from outside facility.  Follow-up again in 4 to 8 weeks

## 2019-12-18 NOTE — Progress Notes (Signed)
[facility] [address] [place] Phone: [phone] Subjective:   I Bradley [name] am serving as a Education administrator for Dr. Hulan Saas.  This visit occurred during the SARS-CoV-2 public health emergency.  Safety protocols were in place, including screening questions prior to the visit, additional usage of staff PPE, and extensive cleaning of exam room while observing appropriate contact time as indicated for disinfecting solutions.   I'm seeing this patient by the request  of:  Delorse Limber  CC: Low back pain  [id number]   01/30/2019 Patient is doing significantly better at this time.  Patient had some tingling in his foot and we discussed that this is likely more compression from some residual swelling.  We discussed the peroneal nerve and watching for any type of weakness.  As long as patient does well he can follow-up as needed.  Patient is in agreement with the plan.  Update 12/18/2019 Bradley BRUMBLEY is a 24 y.o. male coming in with complaint of back pain. Last seen for hamstring strain. Patient states the back has been bothering him 2 weeks ago. Started running and noticed some pain. Mid back pain on the right side. Has tried ibuprofen and icy hot for the pain. Sitting for long periods of time is painful. Pain is consistent. Somewhat better today.  4 out of 10 pain.  Seems to be constant and in the midline.      Past Medical History:  Diagnosis Date  . Anxiety   . Chronic headaches   . Clavicular fracture   . History of chickenpox   . Mild intermittent asthma 06/07/2018  . Shingles    24 years old.   Past Surgical History:  Procedure Laterality Date  . CYSTECTOMY  01/2019   scrotum area   . shoulder surgery Right 08/26/2019  . WISDOM TOOTH EXTRACTION     Social History   Socioeconomic History  . Marital status: Single    Spouse name: Not on file  . Number of children: 0  . Years of education: College  . Highest  education level: Not on file  Occupational History  . Occupation: Sales executive  Tobacco Use  . Smoking status: Never Smoker  . Smokeless tobacco: Never Used  Substance and Sexual Activity  . Alcohol use: Not Currently    Comment: Seldom  . Drug use: Yes    Types: Marijuana    Comment: rare use  . Sexual activity: Yes    Partners: Female  Other Topics Concern  . Not on file  Social History Narrative   Lives alone   Caffeine use: 1 cup coffee per day   Right handed    Social Determinants of Health   Financial Resource Strain:   . Difficulty of Paying Living Expenses:   Food Insecurity:   . Worried About Charity fundraiser in the Last Year:   . Arboriculturist in the Last Year:   Transportation Needs:   . Film/video editor (Medical):   Marland Kitchen Lack of Transportation (Non-Medical):   Physical Activity:   . Days of Exercise per Week:   . Minutes of Exercise per Session:   Stress:   . Feeling of Stress :   Social Connections:   . Frequency of Communication with Friends and Family:   . Frequency of Social Gatherings with Friends and Family:   . Attends Religious Services:   . Active Member of Clubs or Organizations:   .  Attends Archivist Meetings:   Marland Kitchen Marital Status:    Allergies  Allergen Reactions  . Shellfish Allergy Anaphylaxis  . Hydrocodone Itching    Pt states he feels itchy after taking, but it doesn't preclude him taking hydrocodone if needed   Family History  Problem Relation Age of Onset  . Hypertension Mother   . Colon polyps Mother        might be mistakrn  . Diabetes Father        type II  . Migraines Sister   . Colon cancer Neg Hx   . Esophageal cancer Neg Hx   . Rectal cancer Neg Hx   . Stomach cancer Neg Hx       Current Outpatient Medications (Respiratory):  .  fexofenadine (ALLEGRA ALLERGY) 180 MG tablet, Take 180 mg by mouth daily. .  VENTOLIN HFA 108 (90 Base) MCG/ACT inhaler, Inhale 2 puffs into the lungs every 4 (four)  hours as needed.  Current Outpatient Medications (Analgesics):  .  meloxicam (MOBIC) 15 MG tablet, Take 1 tablet (15 mg total) by mouth daily.   Current Outpatient Medications (Other):  .  busPIRone (BUSPAR) 5 MG tablet, Take 1 tablet (5 mg total) by mouth 2 (two) times daily. .  clonazePAM (KLONOPIN) 0.5 MG disintegrating tablet, TAKE 1 TABLET BY MOUTH TWICE A DAY AS NEEDED .  omeprazole (PRILOSEC) 40 MG capsule, TAKE 1 CAPSULE BY MOUTH DAILY .  tiZANidine (ZANAFLEX) 4 MG tablet, Take 1 pill every 8 hours as needed for headache. Marland Kitchen  tiZANidine (ZANAFLEX) 4 MG capsule, 1 tablet at night   Reviewed prior external information including notes and imaging from  primary care provider As well as notes that were available from care everywhere and other healthcare systems.  Past medical history, social, surgical and family history all reviewed in electronic medical record.  No pertanent information unless stated regarding to the chief complaint.   Review of Systems:  No headache, visual changes, nausea, vomiting, diarrhea, constipation, dizziness, abdominal pain, skin rash, fevers, chills, night sweats, weight loss, swollen lymph nodes, body aches, joint swelling, chest pain, shortness of breath, mood changes. POSITIVE muscle aches  Objective  Blood pressure 120/82, pulse 70, height 6\' 6"  (1.981 m), weight (!) 354 lb (160.6 kg), SpO2 98 %.   General: No apparent distress alert and oriented x3 mood and affect normal, dressed appropriately.  HEENT: Pupils equal, extraocular movements intact  Respiratory: Patient's speak in full sentences and does not appear short of breath  Cardiovascular: No lower extremity edema, non tender, no erythema  Neuro: Cranial nerves II through XII are intact, neurovascularly intact in all extremities with 2+ DTRs and 2+ pulses.  Gait normal with good balance and coordination.  MSK:  Non tender with full range of motion and good stability and symmetric strength and  tone of shoulders, elbows, wrist, hip, knee and ankles bilaterally.  Back exam shows the patient does have poor core strength.  Tenderness seems to be more midline around the L1-L2 area may be mildly more right greater than left of midline.  Patient has negative straight leg test.  Some mild tightness with Corky Sox test bilaterally but very minimal.  [id number]; 15 additional minutes spent for Therapeutic exercises as stated in above notes.  This included exercises focusing on stretching, strengthening, with significant focus on eccentric aspects.   Long term goals include an improvement in range of motion, strength, endurance as well as avoiding reinjury. Patient's frequency would include in 1-2 times  a day, 3-5 times a week for a duration of 6-12 weeks.  Low back exercises that included:  Pelvic tilt/bracing instruction to focus on control of the pelvic girdle and lower abdominal muscles  Glute strengthening exercises, focusing on proper firing of the glutes without engaging the low back muscles Proper stretching techniques for maximum relief for the hamstrings, hip flexors, low back and some rotation where tolerated  Proper technique shown and discussed handout in great detail with ATC.  All questions were discussed and answered.     Impression and Recommendations:     This case required medical decision making of moderate complexity. The above documentation has been reviewed and is accurate and complete Lyndal Pulley, DO       Note: This dictation was prepared with Dragon dictation along with smaller phrase technology. Any transcriptional errors that result from this process are unintentional.

## 2019-12-20 ENCOUNTER — Other Ambulatory Visit: Payer: Self-pay | Admitting: Physician Assistant

## 2019-12-20 DIAGNOSIS — F411 Generalized anxiety disorder: Secondary | ICD-10-CM

## 2019-12-23 ENCOUNTER — Telehealth: Payer: Self-pay | Admitting: Family Medicine

## 2019-12-23 ENCOUNTER — Ambulatory Visit (INDEPENDENT_AMBULATORY_CARE_PROVIDER_SITE_OTHER): Payer: 59

## 2019-12-23 DIAGNOSIS — J309 Allergic rhinitis, unspecified: Secondary | ICD-10-CM | POA: Diagnosis not present

## 2019-12-23 NOTE — Telephone Encounter (Signed)
Sent patient MyChart message.

## 2019-12-23 NOTE — Telephone Encounter (Signed)
Patient called to follow up on the xrays that were brought to the office for Dr Tamala Julian to look at. He wanted to make sure everything was okay with them or if he needed to do anything. Please advise.  (okay to message through [place])

## 2019-12-23 NOTE — Telephone Encounter (Signed)
Looked normal. Thank you for bringing them, please call him

## 2019-12-23 NOTE — Telephone Encounter (Signed)
Discussed with pt

## 2019-12-25 ENCOUNTER — Ambulatory Visit: Payer: 59 | Admitting: Family Medicine

## 2020-01-01 ENCOUNTER — Ambulatory Visit (INDEPENDENT_AMBULATORY_CARE_PROVIDER_SITE_OTHER): Payer: 59 | Admitting: Otolaryngology

## 2020-01-02 ENCOUNTER — Other Ambulatory Visit: Payer: Self-pay

## 2020-01-02 ENCOUNTER — Encounter: Payer: Self-pay | Admitting: Gastroenterology

## 2020-01-02 ENCOUNTER — Ambulatory Visit (INDEPENDENT_AMBULATORY_CARE_PROVIDER_SITE_OTHER): Payer: 59 | Admitting: Gastroenterology

## 2020-01-02 VITALS — BP 124/78 | HR 83 | Temp 98.2°F | Ht 78.0 in | Wt 351.1 lb

## 2020-01-02 DIAGNOSIS — Z860100 Personal history of colon polyps, unspecified: Secondary | ICD-10-CM

## 2020-01-02 DIAGNOSIS — R112 Nausea with vomiting, unspecified: Secondary | ICD-10-CM | POA: Diagnosis not present

## 2020-01-02 DIAGNOSIS — K219 Gastro-esophageal reflux disease without esophagitis: Secondary | ICD-10-CM | POA: Diagnosis not present

## 2020-01-02 MED ORDER — OMEPRAZOLE 40 MG PO CPDR: 40.0000 mg | DELAYED_RELEASE_CAPSULE | Freq: Every day | ORAL | 3 refills | Status: DC

## 2020-01-02 NOTE — Patient Instructions (Signed)
We have sent the following medications to your pharmacy for you to pick up at your convenience:    Return in 3-6 months  It was a pleasure to see you today!  Vito Cirigliano, D.O.

## 2020-01-02 NOTE — Progress Notes (Signed)
P  Chief Complaint:    Nausea/vomiting, reflux  GI History: 24 year old male with a history of obesity, mild intermittent asthma, anxiety, follows in the GI clinic for:  1) IBS mixed type.   Index symptoms of abdominal pain change in bowel habits. Symptoms have been essentially lifelong, increasing in frequency/severity since 2020. Endorses intermittent abdominal cramping, generalized abdominal pain,nausea without emesis at that time. Pain described as dull but occasionally sharpspots of pain, lasting a few seconds. Symptoms essentially resolved early 2021.  2) Reflux: Index symptoms of heartburn, regurgitation, globus sensation.  Typically postprandial, but can occur at random throughout the day and early a.m.  No nocturnal symptoms.  Good response to trial of PPI  Evaluation to date: -CT abdomen/pelvis (09/2019): Normal -EGD (02/2019, Dr. Bryan Lemma): Normal with negative gastric/duodenal biopsies -Colonoscopy (02/2019, Dr. Bryan Lemma): 12 mm cecal polyp (TA), 4 mm sigmoid polyp (TA), otherwise normal with biopsies negative for MC, normal TI.  Repeat in 3 years -02/2019: Normal CBC and CMP -10/2018: Normal RUQ [place] (had RUQ pain at that time) -2019: Normal CBC, CMP, TSH, negative C. difficile, stool culture  HPI:     Patient is a 24 y.o. male presenting to the Gastroenterology Clinic for follow-up.  Last seen by me on 11/26/2019.  At that time was c/o reflux symptoms (heartburn, regurgitation, globus sensation).  Had been seen by ENT prior to that and diagnosed with suspected reflux, started on omeprazole 40 mg daily x2 weeks.  At that time, increase omeprazole to 40 mg bid x4 weeks for diagnostic/therapeutic intent.  Also recommended antireflux lifestyle/dietary modifications.  If symptoms persist, consideration for Bravo or pH/impedance testing.  Was seen by Dr. Radene Journey (ENT) for second opinion on 12/12/2019.  CT sinus showed clear paranasal sinuses.  Recommended nasal spray, Afrin,  saline rinse, omeprazole.  Today, his major concern is AM nausea with occasional nonbloody emesis.  Typical reflux symptoms (heartburn, regurgitation, globus) otherwise essentially resolved since starting PPI.  Currently taking omeprazole 20 mg in a.m., and 40 mg in p.m. (was nervous about taking 40 mg bid).  Occasionally misses predinner dose.  Otherwise, no provoking factors for episodes of nausea/vomiting, and not associated with dinner the night before.  No nocturnal reflux symptoms.  He is concerned about injury to his esophagus or promoting cancer with the nausea/vomiting.   Review of systems:     No chest pain, no SOB, no fevers, no urinary sx   Past Medical History:  Diagnosis Date  . Anxiety   . Chronic headaches   . Clavicular fracture   . History of chickenpox   . Mild intermittent asthma 06/07/2018  . Shingles    24 years old.    Patient's surgical history, family medical history, social history, medications and allergies were all reviewed in Epic    Current Outpatient Medications  Medication Sig Dispense Refill  . busPIRone (BUSPAR) 5 MG tablet TAKE 1 TABLET BY MOUTH TWICE A DAY 180 tablet 0  . fexofenadine (ALLEGRA ALLERGY) 180 MG tablet Take 180 mg by mouth daily.    Marland Kitchen omeprazole (PRILOSEC) 40 MG capsule TAKE 1 CAPSULE BY MOUTH DAILY    . cephALEXin (KEFLEX) 500 MG capsule Take 500 mg by mouth 3 (three) times daily.    . clonazePAM (KLONOPIN) 0.5 MG disintegrating tablet TAKE 1 TABLET BY MOUTH TWICE A DAY AS NEEDED (Patient not taking: Reported on 01/02/2020) 30 tablet 0  . meloxicam (MOBIC) 15 MG tablet Take 1 tablet (15 mg total) by mouth  daily. (Patient not taking: Reported on 01/02/2020) 30 tablet 0  . VENTOLIN HFA 108 (90 Base) MCG/ACT inhaler Inhale 2 puffs into the lungs every 4 (four) hours as needed.     No current facility-administered medications for this visit.    Physical Exam:     BP 124/78   Pulse 83   Temp 98.2 F (36.8 C)   Ht 6\' 6"  (1.981 m)   Wt  (!) 351 lb 2 oz (159.3 kg)   BMI 40.58 kg/m   GENERAL:  Pleasant male in NAD PSYCH: : Cooperative, normal affect NEURO: Alert and oriented x 3, no focal neurologic deficits   IMPRESSION and PLAN:    1) Nausea/Vomiting Episodic nausea/vomiting, which only occurs in the morning.  Unclear if this is due to suspected underlying reflux, 2/2 postnasal drip/sinusitis, or combination two. -Take omeprazole at the full 40 mg bid dose x4 weeks and observe for clinical improvement -In diet/activity diary surrounding events may be helpful to try to pinpoint any certain provoking factors -Has been seen by ENT -CT abdomen/pelvis in 09/2019 was otherwise normal -MRI brain was done for the reasons in 03/2018.  If ongoing symptoms, could consider CT head  2) GERD: -Typical reflux symptoms otherwise well controlled PPI -Did discuss the possibility of pH/impedance testing or Bravo (off PPI) to assess degree/severity of underlying reflux, and potentially try to correlate reflux and nausea/vomiting above.  He would like to hold off on additional testing at this time -EGD in 02/2019 was otherwise essentially normal  3) History of colon polyps -Colonoscopy in 2020 with 12 mm TA -Repeat colonoscopy 2023 for ongoing polyp surveillance per guidelines         Lavena Bullion ,DO, FACG 01/02/2020, 2:29 PM

## 2020-01-10 ENCOUNTER — Other Ambulatory Visit: Payer: Self-pay | Admitting: Family Medicine

## 2020-01-12 ENCOUNTER — Encounter: Payer: Self-pay | Admitting: Physician Assistant

## 2020-01-12 ENCOUNTER — Other Ambulatory Visit: Payer: Self-pay

## 2020-01-12 ENCOUNTER — Telehealth (INDEPENDENT_AMBULATORY_CARE_PROVIDER_SITE_OTHER): Payer: 59 | Admitting: Physician Assistant

## 2020-01-12 DIAGNOSIS — R0789 Other chest pain: Secondary | ICD-10-CM | POA: Diagnosis not present

## 2020-01-12 MED ORDER — MONTELUKAST SODIUM 10 MG PO TABS: 10.0000 mg | ORAL_TABLET | Freq: Every day | ORAL | 3 refills | Status: DC

## 2020-01-12 NOTE — Progress Notes (Signed)
I have discussed the procedure for the virtual visit with the patient who has given consent to proceed with assessment and treatment.   Bradley [name], CMA     

## 2020-01-12 NOTE — Progress Notes (Signed)
Virtual Visit via Video   I connected with patient on 01/12/20 at  4:00 PM EDT by a video enabled telemedicine application and verified that I am speaking with the correct person using two identifiers.  Location patient: Home Location provider: [facility] Office Persons participating in the virtual visit: Patient, Provider, [place] (Patina Moore)  I discussed the limitations of evaluation and management by telemedicine and the availability of in person appointments. The patient expressed understanding and agreed to proceed.  Subjective:   HPI:   Patient presents via [facility] today complaining of 2 days of some mild windedness with exertion.  Will have some windedness initially with resting but this subsides quickly.  Occasional chest tightness and wheeze, but notes he did use his Ventolin inhaler without improvement.  Of note patient does endorse that he had a recent oral procedure and as such has not been exercising in the past couple of weeks.  Just restarted exercise the day of symptom onset, walking outside.  Does also have history of chronic rhinitis for which he sees ENT.  Denies any chest pain, lightheadedness or dizziness.  Has had recent negative cardiac work-up for prior history of atypical chest pain, resolved.  Chest tightness/aching.  Denies URI symptoms.   No increase in stressors. Anxiety not worsening.   Keeping well-hydrated.  Denies heartburn or indigestion. Taking the Omeprazole 40 mg BID per specialist.   Has been very sedentary over the past couple of weeks after he had oral surgery.   ROS:   See pertinent positives and negatives per HPI.  Patient Active Problem List   Diagnosis Date Noted  . Low back pain 12/18/2019  . Seasonal and perennial allergic rhinitis 10/15/2019  . Shortness of breath 10/15/2019  . 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 Hamstring injury, right, initial encounter 01/23/2019  . Migraine without aura  and without status migrainosus, not intractable 07/10/2018  . Morbid obesity with BMI of 40.0-44.9, adult ([place]) 07/10/2018  . Allergic rhinitis 06/07/2018  . Mild intermittent asthma 06/07/2018  . Atypical chest pain 12/21/2017  . Generalized abdominal pain 12/17/2017  . Anxiety 11/28/2017    Social History   Tobacco Use  . Smoking status: Never Smoker  . Smokeless tobacco: Never Used  Substance Use Topics  . Alcohol use: Not Currently    Comment: seldomly    Current Outpatient Medications:  .  busPIRone (BUSPAR) 5 MG tablet, TAKE 1 TABLET BY MOUTH TWICE A DAY, Disp: 180 tablet, Rfl: 0 .  clonazePAM (KLONOPIN) 0.5 MG disintegrating tablet, TAKE 1 TABLET BY MOUTH TWICE A DAY AS NEEDED, Disp: 30 tablet, Rfl: 0 .  fexofenadine (ALLEGRA ALLERGY) 180 MG tablet, Take 180 mg by mouth daily., Disp: , Rfl:  .  omeprazole (PRILOSEC) 40 MG capsule, Take 1 capsule (40 mg total) by mouth daily., Disp: 180 capsule, Rfl: 3 .  VENTOLIN HFA 108 (90 Base) MCG/ACT inhaler, Inhale 2 puffs into the lungs every 4 (four) hours as needed., Disp: , Rfl:   Allergies  Allergen Reactions  . Shellfish Allergy Anaphylaxis  . Hydrocodone Itching    Pt states he feels itchy after taking, but it doesn't preclude him taking hydrocodone if needed    Objective:   There were no vitals taken for this visit.  Patient is well-developed, well-nourished in no acute distress.  Resting comfortably at home.  Head is normocephalic, atraumatic.  No labored breathing.  Speech is clear and coherent with logical content.  Patient is alert  and oriented at baseline.   Assessment and Plan:   1. Chest tightness With some mild windedness with exertion.  Prior negative cardiac work-up.  Young age so another reason he has lower ACS risk.  Does have history of chronic rhinitis and reactive airway.  Could potentially be a combination of deconditioning and allergen induced reactive airway.  We'll add on Singulair to his current  regimen.  Close follow-up discussed.  Patient voiced understanding and agreement with plan.    Leeanne Rio, PA-C 01/12/2020

## 2020-01-13 ENCOUNTER — Encounter: Payer: Self-pay | Admitting: Physician Assistant

## 2020-01-13 DIAGNOSIS — F419 Anxiety disorder, unspecified: Secondary | ICD-10-CM

## 2020-01-14 MED ORDER — CLONAZEPAM 0.5 MG PO TBDP: 0.5000 mg | ORAL_TABLET | Freq: Two times a day (BID) | ORAL | 0 refills | Status: DC | PRN

## 2020-01-16 ENCOUNTER — Ambulatory Visit (INDEPENDENT_AMBULATORY_CARE_PROVIDER_SITE_OTHER): Payer: 59

## 2020-01-16 DIAGNOSIS — J309 Allergic rhinitis, unspecified: Secondary | ICD-10-CM | POA: Diagnosis not present

## 2020-01-20 ENCOUNTER — Encounter: Payer: 59 | Admitting: Physician Assistant

## 2020-01-21 ENCOUNTER — Telehealth: Payer: Self-pay | Admitting: Physician Assistant

## 2020-01-21 ENCOUNTER — Ambulatory Visit (INDEPENDENT_AMBULATORY_CARE_PROVIDER_SITE_OTHER): Payer: 59 | Admitting: Physician Assistant

## 2020-01-21 ENCOUNTER — Other Ambulatory Visit: Payer: Self-pay

## 2020-01-21 ENCOUNTER — Encounter: Payer: Self-pay | Admitting: Physician Assistant

## 2020-01-21 VITALS — BP 120/82 | HR 79 | Temp 98.0°F | Resp 16 | Ht 78.0 in | Wt 347.0 lb

## 2020-01-21 DIAGNOSIS — Z0000 Encounter for general adult medical examination without abnormal findings: Secondary | ICD-10-CM | POA: Diagnosis not present

## 2020-01-21 DIAGNOSIS — R748 Abnormal levels of other serum enzymes: Secondary | ICD-10-CM | POA: Diagnosis not present

## 2020-01-21 DIAGNOSIS — Z6841 Body Mass Index (BMI) 40.0 and over, adult: Secondary | ICD-10-CM

## 2020-01-21 LAB — LIPID PANEL
Cholesterol: 132 mg/dL (ref 0–200)
HDL: 34 mg/dL — ABNORMAL LOW (ref >39.00)
LDL Cholesterol: 82 mg/dL (ref 0–99)
NonHDL: 97.95
Total CHOL/HDL Ratio: 4
Triglycerides: 79 mg/dL (ref 0.0–149.0)
VLDL: 15.8 mg/dL (ref 0.0–40.0)

## 2020-01-21 LAB — COMPREHENSIVE METABOLIC PANEL
ALT: 23 U/L (ref 0–53)
AST: 16 U/L (ref 0–37)
Albumin: 4.7 g/dL (ref 3.5–5.2)
Alkaline Phosphatase: 59 U/L (ref 39–117)
BUN: 10 mg/dL (ref 6–23)
CO2: 26 mEq/L (ref 19–32)
Calcium: 9.3 mg/dL (ref 8.4–10.5)
Chloride: 104 mEq/L (ref 96–112)
Creatinine, Ser: 0.72 mg/dL (ref 0.40–1.50)
GFR: 133.48 mL/min (ref >60.00)
Glucose, Bld: 103 mg/dL — ABNORMAL HIGH (ref 70–99)
Potassium: 4 mEq/L (ref 3.5–5.1)
Sodium: 137 mEq/L (ref 135–145)
Total Bilirubin: 0.9 mg/dL (ref 0.2–1.2)
Total Protein: 7 g/dL (ref 6.0–8.3)

## 2020-01-21 LAB — HEMOGLOBIN A1C: Hgb A1c MFr Bld: 5 % (ref 4.6–6.5)

## 2020-01-21 LAB — LIPASE: Lipase: 5 U/L — ABNORMAL LOW (ref 11.0–59.0)

## 2020-01-21 NOTE — Progress Notes (Signed)
Patient presents to clinic today for annual exam.  Patient is fasting for labs.  Diet -- Well-balanced overall. Has been waiting smaller portions but a little more frequently. So usually 4  "meals" per day but no snacking. Is keeping well hydrated. Has noted weight loss thus far.   Exercise -- 2-3 x week, walking for 30 minutes or so.   Acute Concerns: Denies acute concerns today. Would like his lipase rechecked as he has had transient elevations in this. Asymptomatic at present.   Health Maintenance: Immunizations -- TDaP  Past Medical History:  Diagnosis Date  . Anxiety   . Chronic headaches   . Clavicular fracture   . History of chickenpox   . Mild intermittent asthma 06/07/2018  . Shingles    24 years old.    Past Surgical History:  Procedure Laterality Date  . CYSTECTOMY  01/2019   scrotum area   . shoulder surgery Right 08/26/2019  . WISDOM TOOTH EXTRACTION      Current Outpatient Medications on File Prior to Visit  Medication Sig Dispense Refill  . busPIRone (BUSPAR) 5 MG tablet TAKE 1 TABLET BY MOUTH TWICE A DAY 180 tablet 0  . clonazePAM (KLONOPIN) 0.5 MG disintegrating tablet Take 1 tablet (0.5 mg total) by mouth 2 (two) times daily as needed. 30 tablet 0  . fexofenadine (ALLEGRA ALLERGY) 180 MG tablet Take 180 mg by mouth daily.    Marland Kitchen omeprazole (PRILOSEC) 40 MG capsule Take 1 capsule (40 mg total) by mouth daily. 180 capsule 3  . VENTOLIN HFA 108 (90 Base) MCG/ACT inhaler Inhale 2 puffs into the lungs every 4 (four) hours as needed.    . montelukast (SINGULAIR) 10 MG tablet Take 1 tablet (10 mg total) by mouth at bedtime. (Patient not taking: Reported on 01/21/2020) 30 tablet 3   No current facility-administered medications on file prior to visit.    Allergies  Allergen Reactions  . Shellfish Allergy Anaphylaxis  . Hydrocodone Itching    Pt states he feels itchy after taking, but it doesn't preclude him taking hydrocodone if needed    Family History    Problem Relation Age of Onset  . Hypertension Mother   . Colon polyps Mother        might be mistakrn  . Diabetes Father        type II  . Migraines Sister   . Colon cancer Neg Hx   . Esophageal cancer Neg Hx   . Rectal cancer Neg Hx   . Stomach cancer Neg Hx     Social History   Socioeconomic History  . Marital status: Single    Spouse name: Not on file  . Number of children: 0  . Years of education: College  . Highest education level: Not on file  Occupational History  . Occupation: Sales executive  Tobacco Use  . Smoking status: Never Smoker  . Smokeless tobacco: Never Used  Substance and Sexual Activity  . Alcohol use: Not Currently    Comment: seldomly  . Drug use: Yes    Types: Marijuana    Comment: rare use  . Sexual activity: Yes    Partners: Female  Other Topics Concern  . Not on file  Social History Narrative   Lives alone   Caffeine use: 1 cup coffee per day   Right handed    Social Determinants of Health   Financial Resource Strain:   . Difficulty of Paying Living Expenses:   Food Insecurity:   .  Worried About Charity fundraiser in the Last Year:   . Arboriculturist in the Last Year:   Transportation Needs:   . Film/video editor (Medical):   Marland Kitchen Lack of Transportation (Non-Medical):   Physical Activity:   . Days of Exercise per Week:   . Minutes of Exercise per Session:   Stress:   . Feeling of Stress :   Social Connections:   . Frequency of Communication with Friends and Family:   . Frequency of Social Gatherings with Friends and Family:   . Attends Religious Services:   . Active Member of Clubs or Organizations:   . Attends Archivist Meetings:   Marland Kitchen Marital Status:   Intimate Partner Violence:   . Fear of Current or Ex-Partner:   . Emotionally Abused:   Marland Kitchen Physically Abused:   . Sexually Abused:    Review of Systems  Constitutional: Negative for fever, malaise/fatigue and weight loss.  HENT: Negative for ear discharge,  ear pain, hearing loss and tinnitus.   Eyes: Negative for blurred vision, double vision, photophobia and pain.  Respiratory: Negative for cough and shortness of breath.   Cardiovascular: Negative for chest pain and palpitations.  Gastrointestinal: Negative for abdominal pain, blood in stool, constipation, diarrhea, heartburn, melena, nausea and vomiting.  Genitourinary: Negative for dysuria, flank pain, frequency, hematuria and urgency.  Musculoskeletal: Negative for falls.  Neurological: Negative for dizziness, loss of consciousness and headaches.  Endo/Heme/Allergies: Negative for environmental allergies.  Psychiatric/Behavioral: Negative for depression, hallucinations, substance abuse and suicidal ideas. The patient is not nervous/anxious and does not have insomnia.    Wt (!) 347 lb (157.4 kg)   BMI 40.10 kg/m   Physical Exam  Recent Results (from the past 2160 hour(s))  POCT urinalysis dipstick (new)     Status: None   Collection Time: 10/24/19  4:27 PM  Result Value Ref Range   Color, UA yellow yellow   Clarity, UA clear clear   Glucose, UA negative negative mg/dL   Bilirubin, UA negative negative   Ketones, POC UA negative negative mg/dL   Spec Grav, UA 1.020 1.010 - 1.025   Blood, UA negative negative   pH, UA 7.0 5.0 - 8.0   Protein Ur, POC negative negative mg/dL   Urobilinogen, UA 0.2 0.2 or 1.0 E.U./dL   Nitrite, UA Negative Negative   Leukocytes, UA Negative Negative  Amylase     Status: None   Collection Time: 10/24/19  5:49 PM  Result Value Ref Range   Amylase 69 21 - 101 U/L  Lipase     Status: None   Collection Time: 10/24/19  5:49 PM  Result Value Ref Range   Lipase 27 7 - 60 U/L  POCT urinalysis dipstick     Status: None   Collection Time: 12/08/19  1:18 PM  Result Value Ref Range   Color, UA yellow yellow   Clarity, UA clear clear   Glucose, UA negative negative mg/dL   Bilirubin, UA negative negative   Ketones, POC UA negative negative mg/dL   Spec  Grav, UA 1.020 1.010 - 1.025   Blood, UA negative negative   pH, UA 7.5 5.0 - 8.0   Protein Ur, POC negative negative mg/dL   Urobilinogen, UA 0.2 0.2 or 1.0 E.U./dL   Nitrite, UA Negative Negative   Leukocytes, UA Negative Negative    Assessment/Plan: 1. Visit for preventive health examination Depression screen negative. Health Maintenance reviewed. Preventive schedule discussed and handout  given in AVS. Will obtain fasting labs today. - CBC with Differential/Platelet - Comprehensive metabolic panel - Lipid panel - Hemoglobin A1c  2. Morbid obesity with BMI of 40.0-44.9, adult ([place]) Working hard on diet and exercise with noted weight loss. Will recheck labs to further risk stratify. He is to keep up the good work, working up to a goal of 150 minutes per week.  3. Elevated lipase Asymptomatic. Will recheck today due to his concern.  - Lipase  This visit occurred during the SARS-CoV-2 public health emergency.  Safety protocols were in place, including screening questions prior to the visit, additional usage of staff PPE, and extensive cleaning of exam room while observing appropriate contact time as indicated for disinfecting solutions.     Leeanne Rio, PA-C

## 2020-01-21 NOTE — Telephone Encounter (Signed)
He would need another visit to discuss this as was not mentioned today during the entirety of his visit for CPE

## 2020-01-21 NOTE — Patient Instructions (Signed)
Please go to the lab for blood work.   Our office will call you with your results unless you have chosen to receive results via MyChart.  If your blood work is normal we will follow-up each year for physicals and as scheduled for chronic medical problems.  If anything is abnormal we will treat accordingly and get you in for a follow-up.   Preventive Care 30-25 Years Old, Male Preventive care refers to lifestyle choices and visits with your health care provider that can promote health and wellness. This includes:  A yearly physical exam. This is also called an annual well check.  Regular dental and eye exams.  Immunizations.  Screening for certain conditions.  Healthy lifestyle choices, such as eating a healthy diet, getting regular exercise, not using drugs or products that contain nicotine and tobacco, and limiting alcohol use. What can I expect for my preventive care visit? Physical exam Your health care provider will check:  Height and weight. These may be used to calculate body mass index (BMI), which is a measurement that tells if you are at a healthy weight.  Heart rate and blood pressure.  Your skin for abnormal spots. Counseling Your health care provider may ask you questions about:  Alcohol, tobacco, and drug use.  Emotional well-being.  Home and relationship well-being.  Sexual activity.  Eating habits.  Work and work Statistician. What immunizations do I need?  Influenza (flu) vaccine  This is recommended every year. Tetanus, diphtheria, and pertussis (Tdap) vaccine  You may need a Td booster every 10 years. Varicella (chickenpox) vaccine  You may need this vaccine if you have not already been vaccinated. Human papillomavirus (HPV) vaccine  If recommended by your health care provider, you may need three doses over 6 months. Measles, mumps, and rubella (MMR) vaccine  You may need at least one dose of MMR. You may also need a second  dose. Meningococcal conjugate (MenACWY) vaccine  One dose is recommended if you are 41-67 years old and a Market researcher living in a residence hall, or if you have one of several medical conditions. You may also need additional booster doses. Pneumococcal conjugate (PCV13) vaccine  You may need this if you have certain conditions and were not previously vaccinated. Pneumococcal polysaccharide (PPSV23) vaccine  You may need one or two doses if you smoke cigarettes or if you have certain conditions. Hepatitis A vaccine  You may need this if you have certain conditions or if you travel or work in places where you may be exposed to hepatitis A. Hepatitis B vaccine  You may need this if you have certain conditions or if you travel or work in places where you may be exposed to hepatitis B. Haemophilus influenzae type b (Hib) vaccine  You may need this if you have certain risk factors. You may receive vaccines as individual doses or as more than one vaccine together in one shot (combination vaccines). Talk with your health care provider about the risks and benefits of combination vaccines. What tests do I need? Blood tests  Lipid and cholesterol levels. These may be checked every 5 years starting at age 80.  Hepatitis C test.  Hepatitis B test. Screening   Diabetes screening. This is done by checking your blood sugar (glucose) after you have not eaten for a while (fasting).  Sexually transmitted disease (STD) testing. Talk with your health care provider about your test results, treatment options, and if necessary, the need for more tests. Follow these  instructions at home: Eating and drinking   Eat a diet that includes fresh fruits and vegetables, whole grains, lean protein, and low-fat dairy products.  Take vitamin and mineral supplements as recommended by your health care provider.  Do not drink alcohol if your health care provider tells you not to drink.  If you  drink alcohol: ? Limit how much you have to 0-2 drinks a day. ? Be aware of how much alcohol is in your drink. In the [place] one drink equals one 12 oz bottle of beer (355 mL), one 5 oz glass of wine (148 mL), or one 1 oz glass of hard liquor (44 mL). Lifestyle  Take daily care of your teeth and gums.  Stay active. Exercise for at least 30 minutes on 5 or more days each week.  Do not use any products that contain nicotine or tobacco, such as cigarettes, e-cigarettes, and chewing tobacco. If you need help quitting, ask your health care provider.  If you are sexually active, practice safe sex. Use a condom or other form of protection to prevent STIs (sexually transmitted infections). What's next?  Go to your health care provider once a year for a well check visit.  Ask your health care provider how often you should have your eyes and teeth checked.  Stay up to date on all vaccines. This information is not intended to replace advice given to you by your health care provider. Make sure you discuss any questions you have with your health care provider. Document Revised: 09/05/2018 Document Reviewed: 09/05/2018 Elsevier Patient Education  2020 Elsevier Inc. .      

## 2020-01-21 NOTE — Telephone Encounter (Signed)
LM asking pt to call back to schedule an appt with [facility].

## 2020-01-21 NOTE — Telephone Encounter (Signed)
Pt called in stating that he forgot to talk to [facility] this morning about him possibly having hemorrhoids. He would like to know what to do about this. I did let him know what Einar Pheasant would more than likely want an in office visit to evaluate the issue but would send the message back first. Please call pt and advise

## 2020-01-22 ENCOUNTER — Encounter: Payer: Self-pay | Admitting: Physician Assistant

## 2020-01-22 ENCOUNTER — Ambulatory Visit (INDEPENDENT_AMBULATORY_CARE_PROVIDER_SITE_OTHER): Payer: 59 | Admitting: Physician Assistant

## 2020-01-22 ENCOUNTER — Ambulatory Visit: Payer: 59 | Admitting: Family Medicine

## 2020-01-22 ENCOUNTER — Telehealth: Payer: Self-pay | Admitting: Gastroenterology

## 2020-01-22 VITALS — BP 122/70 | HR 84 | Temp 97.4°F | Resp 18 | Wt 346.2 lb

## 2020-01-22 DIAGNOSIS — L290 Pruritus ani: Secondary | ICD-10-CM

## 2020-01-22 DIAGNOSIS — K602 Anal fissure, unspecified: Secondary | ICD-10-CM | POA: Diagnosis not present

## 2020-01-22 LAB — CBC WITH DIFFERENTIAL/PLATELET
Basophils Absolute: 0 10*3/uL (ref 0.0–0.1)
Basophils Relative: 0.6 % (ref 0.0–3.0)
Eosinophils Absolute: 0.1 10*3/uL (ref 0.0–0.7)
Eosinophils Relative: 0.7 % (ref 0.0–5.0)
HCT: 43.6 % (ref 39.0–52.0)
Hemoglobin: 14.8 g/dL (ref 13.0–17.0)
Lymphocytes Relative: 14.2 % (ref 12.0–46.0)
Lymphs Abs: 1.1 10*3/uL (ref 0.7–4.0)
MCHC: 33.9 g/dL (ref 30.0–36.0)
MCV: 90 fl (ref 78.0–100.0)
Monocytes Absolute: 0.4 10*3/uL (ref 0.1–1.0)
Monocytes Relative: 4.9 % (ref 3.0–12.0)
Neutro Abs: 6.4 10*3/uL (ref 1.4–7.7)
Neutrophils Relative %: 79.6 % — ABNORMAL HIGH (ref 43.0–77.0)
Platelets: 195 10*3/uL (ref 150.0–400.0)
RBC: 4.84 Mil/uL (ref 4.22–5.81)
RDW: 13.3 % (ref 11.5–15.5)
WBC: 8.1 10*3/uL (ref 4.0–10.5)

## 2020-01-22 NOTE — Telephone Encounter (Signed)
Patient called requesting to speak with Dr. Raina Mina about his labs results. Would like to know the cause of the Lipase level fluctuating high\low please advise

## 2020-01-22 NOTE — Progress Notes (Signed)
Patient presents to clinic today c/o 1 week of intermittent episodes of anal pruritus associated with some occasional pain of anus, first noted after a large, hard bowel movement.  Denies any bleeding from the area.  Has been keeping well hydrated.  Talk to one of his friends about symptoms who recommended he contact his primary care provider in case he had a hemorrhoid.  Patient states he forgot to mention this during his physical yesterday.  Past Medical History:  Diagnosis Date  . Anxiety   . Chronic headaches   . Clavicular fracture   . History of chickenpox   . Mild intermittent asthma 06/07/2018  . Shingles    24 years old.    Current Outpatient Medications on File Prior to Visit  Medication Sig Dispense Refill  . busPIRone (BUSPAR) 5 MG tablet TAKE 1 TABLET BY MOUTH TWICE A DAY 180 tablet 0  . clonazePAM (KLONOPIN) 0.5 MG disintegrating tablet Take 1 tablet (0.5 mg total) by mouth 2 (two) times daily as needed. 30 tablet 0  . fexofenadine (ALLEGRA ALLERGY) 180 MG tablet Take 180 mg by mouth daily.    . montelukast (SINGULAIR) 10 MG tablet Take 1 tablet (10 mg total) by mouth at bedtime. (Patient not taking: Reported on 01/21/2020) 30 tablet 3  . omeprazole (PRILOSEC) 40 MG capsule Take 1 capsule (40 mg total) by mouth daily. 180 capsule 3  . VENTOLIN HFA 108 (90 Base) MCG/ACT inhaler Inhale 2 puffs into the lungs every 4 (four) hours as needed.     No current facility-administered medications on file prior to visit.    Allergies  Allergen Reactions  . Shellfish Allergy Anaphylaxis  . Hydrocodone Itching    Pt states he feels itchy after taking, but it doesn't preclude him taking hydrocodone if needed    Family History  Problem Relation Age of Onset  . Hypertension Mother   . Colon polyps Mother        might be mistakrn  . Diabetes Father        type II  . Migraines Sister   . Colon cancer Neg Hx   . Esophageal cancer Neg Hx   . Rectal cancer Neg Hx   . Stomach  cancer Neg Hx     Social History   Socioeconomic History  . Marital status: Single    Spouse name: Not on file  . Number of children: 0  . Years of education: College  . Highest education level: Not on file  Occupational History  . Occupation: Sales executive  Tobacco Use  . Smoking status: Never Smoker  . Smokeless tobacco: Never Used  Substance and Sexual Activity  . Alcohol use: Not Currently    Comment: seldomly  . Drug use: Yes    Types: Marijuana    Comment: rare use  . Sexual activity: Yes    Partners: Female  Other Topics Concern  . Not on file  Social History Narrative   Lives alone   Caffeine use: 1 cup coffee per day   Right handed    Social Determinants of Health   Financial Resource Strain:   . Difficulty of Paying Living Expenses:   Food Insecurity:   . Worried About Charity fundraiser in the Last Year:   . Arboriculturist in the Last Year:   Transportation Needs:   . Film/video editor (Medical):   Marland Kitchen Lack of Transportation (Non-Medical):   Physical Activity:   . Days of Exercise  per Week:   . Minutes of Exercise per Session:   Stress:   . Feeling of Stress :   Social Connections:   . Frequency of Communication with Friends and Family:   . Frequency of Social Gatherings with Friends and Family:   . Attends Religious Services:   . Active Member of Clubs or Organizations:   . Attends Archivist Meetings:   Marland Kitchen Marital Status:    Review of Systems - See HPI.  All other ROS are negative.  BP 122/70   Pulse 84   Temp (!) 97.4 F (36.3 C) (Temporal)   Resp 18   Wt (!) 346 lb 3.2 oz (157 kg)   SpO2 98%   BMI 40.01 kg/m   Physical Exam Vitals reviewed. Exam conducted with a chaperone present.  Genitourinary:      Recent Results (from the past 2160 hour(s))  POCT urinalysis dipstick (new)     Status: None   Collection Time: 10/24/19  4:27 PM  Result Value Ref Range   Color, UA yellow yellow   Clarity, UA clear clear    Glucose, UA negative negative mg/dL   Bilirubin, UA negative negative   Ketones, POC UA negative negative mg/dL   Spec Grav, UA 1.020 1.010 - 1.025   Blood, UA negative negative   pH, UA 7.0 5.0 - 8.0   Protein Ur, POC negative negative mg/dL   Urobilinogen, UA 0.2 0.2 or 1.0 E.U./dL   Nitrite, UA Negative Negative   Leukocytes, UA Negative Negative  Amylase     Status: None   Collection Time: 10/24/19  5:49 PM  Result Value Ref Range   Amylase 69 21 - 101 U/L  Lipase     Status: None   Collection Time: 10/24/19  5:49 PM  Result Value Ref Range   Lipase 27 7 - 60 U/L  POCT urinalysis dipstick     Status: None   Collection Time: 12/08/19  1:18 PM  Result Value Ref Range   Color, UA yellow yellow   Clarity, UA clear clear   Glucose, UA negative negative mg/dL   Bilirubin, UA negative negative   Ketones, POC UA negative negative mg/dL   Spec Grav, UA 1.020 1.010 - 1.025   Blood, UA negative negative   pH, UA 7.5 5.0 - 8.0   Protein Ur, POC negative negative mg/dL   Urobilinogen, UA 0.2 0.2 or 1.0 E.U./dL   Nitrite, UA Negative Negative   Leukocytes, UA Negative Negative  CBC with Differential/Platelet     Status: Abnormal   Collection Time: 01/21/20 10:45 AM  Result Value Ref Range   WBC 8.1 4.0 - 10.5 K/uL   RBC 4.84 4.22 - 5.81 Mil/uL   Hemoglobin 14.8 13.0 - 17.0 g/dL   HCT 43.6 39.0 - 52.0 %   MCV 90.0 78.0 - 100.0 fl   MCHC 33.9 30.0 - 36.0 g/dL   RDW 13.3 11.5 - 15.5 %   Platelets 195.0 150.0 - 400.0 K/uL   Neutrophils Relative % 79.6 (H) 43.0 - 77.0 %   Lymphocytes Relative 14.2 12.0 - 46.0 %   Monocytes Relative 4.9 3.0 - 12.0 %   Eosinophils Relative 0.7 0.0 - 5.0 %   Basophils Relative 0.6 0.0 - 3.0 %   Neutro Abs 6.4 1.4 - 7.7 K/uL   Lymphs Abs 1.1 0.7 - 4.0 K/uL   Monocytes Absolute 0.4 0.1 - 1.0 K/uL   Eosinophils Absolute 0.1 0.0 - 0.7 K/uL  Basophils Absolute 0.0 0.0 - 0.1 K/uL  Comprehensive metabolic panel     Status: Abnormal   Collection Time:  01/21/20 10:45 AM  Result Value Ref Range   Sodium 137 135 - 145 mEq/L   Potassium 4.0 3.5 - 5.1 mEq/L   Chloride 104 96 - 112 mEq/L   CO2 26 19 - 32 mEq/L   Glucose, Bld 103 (H) 70 - 99 mg/dL   BUN 10 6 - 23 mg/dL   Creatinine, Ser 0.72 0.40 - 1.50 mg/dL   Total Bilirubin 0.9 0.2 - 1.2 mg/dL   Alkaline Phosphatase 59 39 - 117 U/L   AST 16 0 - 37 U/L   ALT 23 0 - 53 U/L   Total Protein 7.0 6.0 - 8.3 g/dL   Albumin 4.7 3.5 - 5.2 g/dL   GFR 133.48 >60.00 mL/min   Calcium 9.3 8.4 - 10.5 mg/dL  Lipid panel     Status: Abnormal   Collection Time: 01/21/20 10:45 AM  Result Value Ref Range   Cholesterol 132 0 - 200 mg/dL    Comment: ATP III Classification       Desirable:  < 200 mg/dL               Borderline High:  200 - 239 mg/dL          High:  > = 240 mg/dL   Triglycerides 79.0 0.0 - 149.0 mg/dL    Comment: Normal:  <150 mg/dLBorderline High:  150 - 199 mg/dL   HDL 34.00 (L) >39.00 mg/dL   VLDL 15.8 0.0 - 40.0 mg/dL   LDL Cholesterol 82 0 - 99 mg/dL   Total CHOL/HDL Ratio 4     Comment:                Men          Women1/2 Average Risk     3.4          3.3Average Risk          5.0          4.42X Average Risk          9.6          7.13X Average Risk          15.0          11.0                       NonHDL 97.95     Comment: NOTE:  Non-HDL goal should be 30 mg/dL higher than patient's LDL goal (i.e. LDL goal of < 70 mg/dL, would have non-HDL goal of < 100 mg/dL)  Hemoglobin A1c     Status: None   Collection Time: 01/21/20 10:45 AM  Result Value Ref Range   Hgb A1c MFr Bld 5.0 4.6 - 6.5 %    Comment: Glycemic Control Guidelines for People with Diabetes:Non Diabetic:  <6%Goal of Therapy: <7%Additional Action Suggested:  >8%   Lipase     Status: Abnormal   Collection Time: 01/21/20 10:45 AM  Result Value Ref Range   Lipase 5.0 (L) 11.0 - 59.0 U/L    Assessment/Plan: 1. Anal fissure 2. Anal pruritus No evidence of internal or external hemorrhoid on examination.  No evidence of  yeast, fungal or bacterial infection of the perineum.  Fissure noted.  Small with signs of healing.  No sentinel skin tag noted.  Recommend increase fluid and good fiber intake.  Stool softener  recommended.  Recommended sitz bath.  OTC cortisone cream applied to the area twice daily to help with itch.  Follow-up if not improving/resolving over the next 1-1/2 weeks as this would warrant further assessment.  Patient voiced understanding and agreement with the plan.  This visit occurred during the SARS-CoV-2 public health emergency.  Safety protocols were in place, including screening questions prior to the visit, additional usage of staff PPE, and extensive cleaning of exam room while observing appropriate contact time as indicated for disinfecting solutions.      Leeanne Rio, PA-C

## 2020-01-22 NOTE — Telephone Encounter (Signed)
Spoke to Dr Lyndel Safe about patients fluctuating lipases level and relayed the information to the patient. All questions answered patient was very appreciated and voiced understanding.

## 2020-01-22 NOTE — Progress Notes (Deleted)
Bradley [name] Bradley Phone: [phone] Subjective:    I'm seeing this patient by the request  of:  Brunetta Jeans, PA-C  CC:   [id number]   12/18/2019 Midline tenderness.  This is a undiagnosed new problem.  Uncertain prognosis.  Patient did have a CT scan for renal stone that was unremarkable when I review it independently do not see any significant arthritic changes.  Do not feel with it being midline and no radicular symptoms at other advanced imaging is warranted at this time.  Meloxicam, Zanaflex, prescribed today.  Home exercises given and work with Product/process development scientist.  Discussed with patient high impact exercises secondary to patient's body habitus may not be the best and we discussed augmenting certain workouts.  Awaiting other x-rays from outside facility.  Follow-up again in 4 to 8 weeks  Update 01/22/2020 Bradley [name] is a 24 y.o. male coming in with complaint of low back pain. Patient states      Past Medical History:  Diagnosis Date  . Anxiety   . Chronic headaches   . Clavicular fracture   . History of chickenpox   . Mild intermittent asthma 06/07/2018  . Shingles    24 years old.   Past Surgical History:  Procedure Laterality Date  . CYSTECTOMY  01/2019   scrotum area   . shoulder surgery Right 08/26/2019  . WISDOM TOOTH EXTRACTION     Social History   Socioeconomic History  . Marital status: Single    Spouse name: Not on file  . Number of children: 0  . Years of education: College  . Highest education level: Not on file  Occupational History  . Occupation: Sales executive  Tobacco Use  . Smoking status: Never Smoker  . Smokeless tobacco: Never Used  Substance and Sexual Activity  . Alcohol use: Not Currently    Comment: seldomly  . Drug use: Yes    Types: Marijuana    Comment: rare use  . Sexual activity: Yes    Partners: Female  Other Topics Concern  . Not on file  Social History  Narrative   Lives alone   Caffeine use: 1 cup coffee per day   Right handed    Social Determinants of Health   Financial Resource Strain:   . Difficulty of Paying Living Expenses:   Food Insecurity:   . Worried About Charity fundraiser in the Last Year:   . Arboriculturist in the Last Year:   Transportation Needs:   . Film/video editor (Medical):   Marland Kitchen Lack of Transportation (Non-Medical):   Physical Activity:   . Days of Exercise per Week:   . Minutes of Exercise per Session:   Stress:   . Feeling of Stress :   Social Connections:   . Frequency of Communication with Friends and Family:   . Frequency of Social Gatherings with Friends and Family:   . Attends Religious Services:   . Active Member of Clubs or Organizations:   . Attends Archivist Meetings:   Marland Kitchen Marital Status:    Allergies  Allergen Reactions  . Shellfish Allergy Anaphylaxis  . Hydrocodone Itching    Pt states he feels itchy after taking, but it doesn't preclude him taking hydrocodone if needed   Family History  Problem Relation Age of Onset  . Hypertension Mother   . Colon polyps Mother        might be  mistakrn  . Diabetes Father        type II  . Migraines Sister   . Colon cancer Neg Hx   . Esophageal cancer Neg Hx   . Rectal cancer Neg Hx   . Stomach cancer Neg Hx       Current Outpatient Medications (Respiratory):  .  fexofenadine (ALLEGRA ALLERGY) 180 MG tablet, Take 180 mg by mouth daily. .  montelukast (SINGULAIR) 10 MG tablet, Take 1 tablet (10 mg total) by mouth at bedtime. (Patient not taking: Reported on 01/21/2020) .  VENTOLIN HFA 108 (90 Base) MCG/ACT inhaler, Inhale 2 puffs into the lungs every 4 (four) hours as needed.    Current Outpatient Medications (Other):  .  busPIRone (BUSPAR) 5 MG tablet, TAKE 1 TABLET BY MOUTH TWICE A DAY .  clonazePAM (KLONOPIN) 0.5 MG disintegrating tablet, Take 1 tablet (0.5 mg total) by mouth 2 (two) times daily as needed. Marland Kitchen  omeprazole  (PRILOSEC) 40 MG capsule, Take 1 capsule (40 mg total) by mouth daily.   Reviewed prior external information including notes and imaging from  primary care provider As well as notes that were available from care everywhere and other healthcare systems.  Past medical history, social, surgical and family history all reviewed in electronic medical record.  No pertanent information unless stated regarding to the chief complaint.   Review of Systems:  No headache, visual changes, nausea, vomiting, diarrhea, constipation, dizziness, abdominal pain, skin rash, fevers, chills, night sweats, weight loss, swollen lymph nodes, body aches, joint swelling, chest pain, shortness of breath, mood changes. POSITIVE muscle aches  Objective  There were no vitals taken for this visit.   General: No apparent distress alert and oriented x3 mood and affect normal, dressed appropriately.  HEENT: Pupils equal, extraocular movements intact  Respiratory: Patient's speak in full sentences and does not appear short of breath  Cardiovascular: No lower extremity edema, non tender, no erythema  Neuro: Cranial nerves II through XII are intact, neurovascularly intact in all extremities with 2+ DTRs and 2+ pulses.  Gait normal with good balance and coordination.  MSK:  Non tender with full range of motion and good stability and symmetric strength and tone of shoulders, elbows, wrist, hip, knee and ankles bilaterally.     Impression and Recommendations:     This case required medical decision making of moderate complexity. The above documentation has been reviewed and is accurate and complete Lyndal Pulley, DO       Note: This dictation was prepared with Dragon dictation along with smaller phrase technology. Any transcriptional errors that result from this process are unintentional.

## 2020-01-22 NOTE — Patient Instructions (Signed)
Keep well-hydrated. Consider a OTC stool softer like Sennekot.  Make sure to get good fiber in your diet.  Clean the area well. Avoid scratching at the area. You can apply OTC cortisone cream to the area 1-2 x daily over the next 4-5 days. OTC steroid creams are safe.  Can also use preparation H cream OTC to help with itch.  Let me know if things are not improving.   Anal Fissure, Adult  An anal fissure is a small tear or crack in the tissue around the opening of the butt (anus). Bleeding from the tear or crack usually stops on its own within a few minutes. The bleeding may happen every time you poop (have a bowel movement) until the tear or crack heals. What are the causes? This condition is usually caused by passing a large or hard poop (stool). Other causes include:  Trouble pooping (constipation).  Passing watery poop (diarrhea).  Inflammatory bowel disease (Crohn's disease or ulcerative colitis).  Childbirth.  Infections.  Anal sex. What are the signs or symptoms? Symptoms of this condition include:  Bleeding from the butt.  Small amounts of blood on your poop. The blood coats the outside of the poop. It is not mixed with the poop.  Small amounts of blood on the toilet paper or in the toilet after you poop.  Pain when passing poop.  Itching or irritation around the opening of the butt. How is this diagnosed? This condition may be diagnosed based on a physical exam. Your doctor may:  Check your butt. A tear can often be seen by checking the area with care.  Check your butt using a short tube (anoscope). The light in the tube will show any problems in your butt. How is this treated? Treatment for this condition may include:  Treating problems that make it hard for you to pass poop. You may be told to: ? Eat more fiber. ? Drink more fluid. ? Take fiber supplements. ? Take medicines that make poop soft.  Taking sitz baths. This may help to heal the  tear.  Using creams and ointments. If your condition gets worse, other treatments may be needed such as:  A shot near the tear or crack (botulinum injection).  Surgery to repair the tear or crack. Follow these instructions at home: Eating and drinking   Avoid bananas and dairy products. These foods can make it hard to poop.  Drink enough fluid to keep your pee (urine) pale yellow.  Eat foods that have a lot of fiber in them, such as: ? Beans. ? Whole grains. ? Fresh fruits. ? Fresh vegetables. General instructions   Take over-the-counter and prescription medicines only as told by your doctor.  Use creams or ointments only as told by your doctor.  Keep the butt area as clean and dry as you can.  Take a warm water bath (sitz bath) as told by your doctor. Do not use soap.  Keep all follow-up visits as told by your doctor. This is important. Contact a doctor if:  You have more bleeding.  You have a fever.  You have watery poop that is mixed with blood.  You have pain.  Your problem gets worse, not better. Summary  An anal fissure is a small tear or crack in the skin around the opening of the butt (anus).  This condition is usually caused by passing a large or hard poop (stool).  Treatment includes treating the problems that make it hard for you  to pass poop.  Follow your doctor's instructions about caring for your condition at home.  Keep all follow-up visits as told by your doctor. This is important. This information is not intended to replace advice given to you by your health care provider. Make sure you discuss any questions you have with your health care provider. Document Revised: 02/21/2018 Document Reviewed: 02/21/2018 Elsevier Patient Education  [place].

## 2020-01-27 ENCOUNTER — Emergency Department (HOSPITAL_BASED_OUTPATIENT_CLINIC_OR_DEPARTMENT_OTHER)
Admission: EM | Admit: 2020-01-27 | Discharge: 2020-01-27 | Disposition: A | Discharge status: Home / Self Care | Payer: 59 | Attending: Emergency Medicine | Admitting: Emergency Medicine

## 2020-01-27 ENCOUNTER — Other Ambulatory Visit: Payer: Self-pay

## 2020-01-27 ENCOUNTER — Encounter (HOSPITAL_BASED_OUTPATIENT_CLINIC_OR_DEPARTMENT_OTHER): Payer: Self-pay | Admitting: *Deleted

## 2020-01-27 DIAGNOSIS — Z79899 Other long term (current) drug therapy: Secondary | ICD-10-CM | POA: Diagnosis not present

## 2020-01-27 DIAGNOSIS — G43009 Migraine without aura, not intractable, without status migrainosus: Secondary | ICD-10-CM

## 2020-01-27 DIAGNOSIS — R519 Headache, unspecified: Secondary | ICD-10-CM | POA: Diagnosis present

## 2020-01-27 DIAGNOSIS — G43909 Migraine, unspecified, not intractable, without status migrainosus: Secondary | ICD-10-CM | POA: Diagnosis not present

## 2020-01-27 DIAGNOSIS — J45909 Unspecified asthma, uncomplicated: Secondary | ICD-10-CM | POA: Insufficient documentation

## 2020-01-27 MED ORDER — KETOROLAC TROMETHAMINE 60 MG/2ML IM SOLN
60.0000 mg | Freq: Once | INTRAMUSCULAR | Status: AC
  Administered 2020-01-27: 19:00:00 60 mg via INTRAMUSCULAR
  Filled 2020-01-27: qty 2

## 2020-01-27 NOTE — Discharge Instructions (Signed)
Make sure to return to the ER if you have worsening symptoms of headache, develop fevers, visual changes, nausea, vomiting, neck stiffness, etc.  Take over-the-counter ibuprofen for your headaches as this is a similar medication for as Toradol.  Continue to take your triptan as needed.

## 2020-01-27 NOTE — ED Triage Notes (Signed)
Hx of migraine headaches. Here for headache. He took Rizitriptan with no relief. He leaves for [place] tomorrow and wanted to be seen before leaving.

## 2020-01-28 NOTE — ED Provider Notes (Signed)
[facility] EMERGENCY DEPARTMENT Provider Note   CSN: [id number] Arrival date & time: 01/27/20  1817     History Chief Complaint  Patient presents with  . Headache    Bradley [name] is a 24 y.o. male.  HPI 24 year old male with a history of anxiety, migraine presents to the ER with a 2-day history of headache.  Patient sees a headache specialist, states that he was prescribed rizatriptan.  He took 1 this morning with no relief.  He states he is leaving for [place] tomorrow and is worried that this headache might be something worse.  He states that this feels like a typical migraine for him, he denies any vision changes, nausea, vomiting, dizziness, syncope, abdominal pain, back pain, chest pain, shortness of breath, fevers, chills, neck stiffness, sensitivity to light.  He rates the pain a 7/10, does not endorse the worst headache of his life.  He states that he has come to the ER for headaches before and that he was given Toradol which has been helpful.    Past Medical History:  Diagnosis Date  . Anxiety   . Chronic headaches   . Clavicular fracture   . History of chickenpox   . Mild intermittent asthma 06/07/2018  . Shingles    24 years old.    Patient Active Problem List   Diagnosis Date Noted  . Low back pain 12/18/2019  . Seasonal and perennial allergic rhinitis 10/15/2019  . Shortness of breath 10/15/2019  . 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 Hamstring injury, right, initial encounter 01/23/2019  . Migraine without aura and without status migrainosus, not intractable 07/10/2018  . Morbid obesity with BMI of 40.0-44.9, adult ([place]) 07/10/2018  . Allergic rhinitis 06/07/2018  . Mild intermittent asthma 06/07/2018  . Atypical chest pain 12/21/2017  . Generalized abdominal pain 12/17/2017  . Anxiety 11/28/2017    Past Surgical History:  Procedure Laterality Date  . CYSTECTOMY  01/2019   scrotum area   . shoulder surgery Right  08/26/2019  . WISDOM TOOTH EXTRACTION         Family History  Problem Relation Age of Onset  . Hypertension Mother   . Colon polyps Mother        might be mistakrn  . Diabetes Father        type II  . Migraines Sister   . Colon cancer Neg Hx   . Esophageal cancer Neg Hx   . Rectal cancer Neg Hx   . Stomach cancer Neg Hx     Social History   Tobacco Use  . Smoking status: Never Smoker  . Smokeless tobacco: Never Used  Substance Use Topics  . Alcohol use: Not Currently    Comment: seldomly  . Drug use: Yes    Types: Marijuana    Comment: rare use    Home Medications Prior to Admission medications   Medication Sig Start Date End Date Taking? Authorizing Provider  busPIRone (BUSPAR) 5 MG tablet TAKE 1 TABLET BY MOUTH TWICE A DAY 12/22/19  Yes Brunetta Jeans, PA-C  clonazePAM (KLONOPIN) 0.5 MG disintegrating tablet Take 1 tablet (0.5 mg total) by mouth 2 (two) times daily as needed. 01/14/20  Yes Brunetta Jeans, PA-C  fexofenadine [facility] ALLERGY) 180 MG tablet Take 180 mg by mouth daily.   Yes [provider]  omeprazole (PRILOSEC) 40 MG capsule Take 1 capsule (40 mg total) by mouth daily. 01/02/20  Yes Cirigliano, Dominic Pea, DO  rizatriptan (MAXALT) 10 MG tablet Take 10 mg by mouth as needed for migraine. May repeat in 2 hours if needed   Yes [provider]  montelukast (SINGULAIR) 10 MG tablet Take 1 tablet (10 mg total) by mouth at bedtime. Patient not taking: Reported on 01/21/2020 01/12/20   Brunetta Jeans, PA-C  VENTOLIN HFA 108 [phone] Base) MCG/ACT inhaler Inhale 2 puffs into the lungs every 4 (four) hours as needed. 10/03/19   [provider]    Allergies    Shellfish allergy and Hydrocodone  Review of Systems   Review of Systems  Constitutional: Negative for chills and fever.  Respiratory: Negative for cough and shortness of breath.   Cardiovascular: Negative for chest pain.  Musculoskeletal: Negative for back pain, neck pain and neck  stiffness.  Neurological: Positive for headaches. Negative for dizziness, seizures, syncope, weakness, light-headedness and numbness.  Psychiatric/Behavioral: Negative for confusion.    Physical Exam Updated Vital Signs BP 128/89   Pulse 74   Temp 98.8 F (37.1 C) (Oral)   Resp 16   Ht 6\' 6"  (1.981 m)   Wt (!) 154.2 kg   SpO2 98%   BMI 39.29 kg/m   Physical Exam Vitals and nursing note reviewed.  Constitutional:      General: He is not in acute distress.    Appearance: He is well-developed. He is obese. He is not ill-appearing, toxic-appearing or diaphoretic.  HENT:     Head: Normocephalic and atraumatic.     Mouth/Throat:     Mouth: Mucous membranes are moist.     Pharynx: Oropharynx is clear.  Eyes:     General: No visual field deficit.    Extraocular Movements: Extraocular movements intact.     Right eye: Normal extraocular motion.     Left eye: Normal extraocular motion.     Conjunctiva/sclera: Conjunctivae normal.     Pupils: Pupils are equal, round, and reactive to light.  Neck:     Meningeal: Brudzinski's sign and Kernig's sign absent.  Cardiovascular:     Rate and Rhythm: Normal rate and regular rhythm.     Heart sounds: Normal heart sounds. No murmur.  Pulmonary:     Effort: Pulmonary effort is normal. No respiratory distress.     Breath sounds: Normal breath sounds.  Abdominal:     Palpations: Abdomen is soft.     Tenderness: There is no abdominal tenderness.  Musculoskeletal:        General: No swelling or tenderness. Normal range of motion.     Cervical back: Normal range of motion and neck supple. No rigidity.  Skin:    General: Skin is warm and dry.     Capillary Refill: Capillary refill takes less than 2 seconds.     Findings: No erythema or rash.  Neurological:     Mental Status: He is alert and oriented to person, place, and time.     GCS: GCS eye subscore is 4. GCS verbal subscore is 5. GCS motor subscore is 6.     Cranial Nerves: No cranial  nerve deficit, dysarthria or facial asymmetry.     Sensory: No sensory deficit.     Motor: No weakness.     Coordination: Romberg sign negative. Coordination normal.     Gait: Gait normal.     Deep Tendon Reflexes: Reflexes normal.  Psychiatric:        Mood and Affect: Mood normal. Mood is not anxious.        Behavior:  Behavior normal.     ED Results / Procedures / Treatments   Labs (all labs ordered are listed, but only abnormal results are displayed) Labs Reviewed - No data to display  EKG None  Radiology No results found.  Procedures Procedures (including critical care time)  Medications Ordered in ED Medications  ketorolac (TORADOL) injection 60 mg (60 mg Intramuscular Given 01/27/20 1925)    ED Course  I have reviewed the triage vital signs and the nursing notes.  Pertinent labs & imaging results that were available during my care of the patient were reviewed by me and considered in my medical decision making (see chart for details).    MDM Rules/Calculators/A&P                     24 year old male with a history of migraines presenting for headache x2 days. On presentation to the ER, patient is alert and oriented, no acute distress, well-appearing resting comfortably in the bed.  Vitals reassuring, patient afebrile.  Physical exam negative for acute neuro findings.    Emergent considerations for headache include subarachnoid hemorrhage, meningitis, temporal arteritis, glaucoma, cerebral ischemia, carotid/vertebral dissection, intracranial tumor, Venous sinus thrombosis, carbon monoxide poisoning, acute or chronic subdural hemorrhage.  Other considerations include: Migraine, Cluster headache, Hypertension, Caffeine, alcohol, or drug withdrawal, Pseudotumor cerebri, Arteriovenous malformation, Head injury, Neurocysticercosis, Post-lumbar puncture, Preeclampsia, Tension headache, Sinusitis, Cervical arthritis, Refractive error causing strain, Dental abscess, Otitis media,  Temporomandibular joint syndrome, Depression, Somatoform disorder (eg, somatization) Trigeminal neuralgia, Glossopharyngeal neuralgia.  Concern for intracranial bleed, meningitis, dissection low.  He is afebrile, no known recent head injury.  Patient states that he had an MRI in January which I personally reviewed without any acute findings.  Patient requesting Toradol over migraine headache.  After receiving the injection, upon reevaluation, patient notes significant improvement in migraines.  He is not having any other symptoms.  He is requesting to leave the ER.  Strict return precautions given.  I encouraged him to follow-up with his headache specialist after his trip to [place].  Suggested to take ibuprofen if his triptan does not work as this is a similar medication to Toradol.  Patient voices understanding is agreeable to this plan.  At this stage in ED course, the patient is medically screened and is stable for discharge.  Final Clinical Impression(s) / ED Diagnoses Final diagnoses:  Migraine without aura and without status migrainosus, not intractable    Rx / DC Orders ED Discharge Orders    None       Garald Balding, PA-C 01/28/20 1215    Gareth Morgan, MD 01/28/20 [phone]

## 2020-02-05 ENCOUNTER — Ambulatory Visit (INDEPENDENT_AMBULATORY_CARE_PROVIDER_SITE_OTHER): Payer: 59

## 2020-02-05 DIAGNOSIS — J309 Allergic rhinitis, unspecified: Secondary | ICD-10-CM

## 2020-02-10 ENCOUNTER — Ambulatory Visit (INDEPENDENT_AMBULATORY_CARE_PROVIDER_SITE_OTHER): Payer: 59 | Admitting: Otolaryngology

## 2020-02-11 ENCOUNTER — Other Ambulatory Visit: Payer: 59

## 2020-02-11 ENCOUNTER — Encounter: Payer: Self-pay | Admitting: Gastroenterology

## 2020-02-11 ENCOUNTER — Other Ambulatory Visit: Payer: Self-pay

## 2020-02-11 ENCOUNTER — Ambulatory Visit (INDEPENDENT_AMBULATORY_CARE_PROVIDER_SITE_OTHER): Payer: 59 | Admitting: Gastroenterology

## 2020-02-11 VITALS — BP 120/82 | HR 84 | Temp 97.3°F | Ht 78.0 in | Wt 348.5 lb

## 2020-02-11 DIAGNOSIS — Z86010 Personal history of colonic polyps: Secondary | ICD-10-CM

## 2020-02-11 DIAGNOSIS — R112 Nausea with vomiting, unspecified: Secondary | ICD-10-CM

## 2020-02-11 DIAGNOSIS — L290 Pruritus ani: Secondary | ICD-10-CM

## 2020-02-11 DIAGNOSIS — K648 Other hemorrhoids: Secondary | ICD-10-CM

## 2020-02-11 DIAGNOSIS — K219 Gastro-esophageal reflux disease without esophagitis: Secondary | ICD-10-CM

## 2020-02-11 MED ORDER — FAMOTIDINE 40 MG PO TABS
40.0000 mg | ORAL_TABLET | Freq: Every day | ORAL | 5 refills | Status: DC
Start: 2020-02-11 — End: 2020-03-24

## 2020-02-11 NOTE — Progress Notes (Signed)
P  Chief Complaint:    Symptomatic hemorrhoids, GERD, rectal itching  GI History: 24 year old malewith a history of obesity, mild intermittent asthma, anxiety, follows in the GI clinic for:  1) IBS mixed type.  Index symptoms ofabdominal pain change in bowel habits.Symptoms have been essentially lifelong, increasing in frequency/severity since 2020. Endorses intermittent abdominal cramping, generalized abdominal pain,nausea without emesis at that time. Pain described as dull but occasionally sharpspots of pain, lasting a few seconds. Symptoms essentially resolved early 2021.  2) Reflux: Index symptoms of heartburn, regurgitation, globus sensation.  Typically postprandial, but can occur at random throughout the day and early a.m.  No nocturnal symptoms.  Good response to trial of PPI-omeprazole 40 mg/day -Evaluated by ENT diagnosis suspected reflux, treated with omeprazole -He sought second opinion ENT consult 12/12/2019-CT with clear paranasal sinuses.  Treated with nasal spray, Afrin, saline rinse, omeprazole. -Discussed pH/impedance testing; declined -EGD 6/20 essentially normal  3) Nausea/vomiting: Episodic, occurs in the morning. -CT abdomen/pelvis 06/2020-normal  Evaluation to date: -CT abdomen/pelvis (09/2019): Normal -EGD (02/2019, Dr. Bryan Lemma): Normal with negative gastric/duodenal biopsies -Colonoscopy (02/2019, Dr. Bryan Lemma): 12 mm cecal polyp (TA), 4 mm sigmoid polyp (TA), otherwise normal with biopsies negative for MC, normal TI. Repeat in 3 years -02/2019: Normal CBC and CMP -10/2018: Normal RUQ [place] (had RUQ pain at that time) -2019: Normal CBC, CMP, TSH, negative C. difficile, stool culture  HPI:     Patient is a 24 y.o. male presenting to the Gastroenterology Clinic for follow-up.   States that reflux symptoms are generally well controlled with Prilosec, but wants to discuss changing to Pepcid instead due to concerns of PPI side effect profile.  C/o  symptomatic hemorrhoids-rectal irritation, itching, pruritus ani.  No hematochezia. Was seen by [facility] for this issue last month and told there was "scarring". Was seen by UC and diagnosed with hemorrhoids and treated with hydrocortisone acetate topical. Feels a sense of "something crawling" on outside and concerned about pinworms, etc. Sxs started last month. No straining to have BM or prolonged time on toilet.  Otherwise no change in bowel habits.  Was seen in the ER for migraines on 01/27/2020.  MRI brain 11/18/2019 with small amount of FLAIR signal abnormality in the right parietal periventricular white matter (nonspecific).  Was seen in the headache clinic at [facility] on 01/28/2020.  Sleep study completed on 02/07/2020.  Was seen by ENT on 02/09/2020 with nose pain, epistaxis.  Diagnosed with rhinitis with staph epidermidis positive culture.  Normal CMP and CBC in 12/2018.    Review of systems:     No chest pain, no SOB, no fevers, no urinary sx   Past Medical History:  Diagnosis Date  . Anxiety   . Chronic headaches   . Clavicular fracture   . History of chickenpox   . Mild intermittent asthma 06/07/2018  . Shingles    24 years old.    Patient's surgical history, family medical history, social history, medications and allergies were all reviewed in Epic    Current Outpatient Medications  Medication Sig Dispense Refill  . busPIRone (BUSPAR) 5 MG tablet TAKE 1 TABLET BY MOUTH TWICE A DAY 180 tablet 0  . clonazePAM (KLONOPIN) 0.5 MG disintegrating tablet Take 1 tablet (0.5 mg total) by mouth 2 (two) times daily as needed. 30 tablet 0  . levocetirizine (XYZAL) 5 MG tablet Take 5 mg by mouth daily.    Marland Kitchen omeprazole (PRILOSEC) 40 MG capsule Take 1 capsule (40 mg total) by  mouth daily. 180 capsule 3  . rizatriptan (MAXALT) 10 MG tablet Take 10 mg by mouth as needed for migraine. May repeat in 2 hours if needed    . VENTOLIN HFA 108 (90 Base) MCG/ACT inhaler Inhale 2 puffs into the lungs every  4 (four) hours as needed.     No current facility-administered medications for this visit.    Physical Exam:     BP 120/82   Pulse 84   Temp (!) 97.3 F (36.3 C)   Ht 6\' 6"  (1.981 m)   Wt (!) 348 lb 8 oz (158.1 kg)   BMI 40.27 kg/m   GENERAL:  Pleasant male in NAD PSYCH: : Cooperative, normal affect ABDOMEN:  Nondistended, soft, nontender.  SKIN:  turgor, no lesions seen Musculoskeletal:  Normal muscle tone, normal strength NEURO: Alert and oriented x 3, no focal neurologic deficits Rectal exam: Sensation intact and preserved anal wink. No external anal fissures, external hemorrhoids or skin tags. Normal sphincter tone. No palpable mass. No blood on the exam glove.  Anoscopy with small internal hemorrhoids.  No internal anal fissure on anoscopy or palpation.  (Chaperone: Grace Bushy, LPN).    IMPRESSION and PLAN:    1) GERD: -Typical reflux symptoms generally well controlled PPI, but wants to change to H2 RA (Pepcid) due to concerns about PPI side effect profile -Change from omeprazole to Pepcid 40 mg qhs per patient request -Previously discussed the possibility of pH/impedance testing or Bravo (off PPI) to assess degree/severity of underlying reflux, and potentially try to correlate reflux and nausea/vomiting above-declined -EGD in 02/2019 was otherwise essentially normal  2) Rectal itching 3) Internal hemorrhoids Relatively recent onset symptomatic hemorrhoids described as rectal itching.  He is also concerned about potential parasitic infection due to "something crawling" -Anoscopy with small internal hemorrhoids, otherwise benign -Stool study -Discussed conservative measures for treatment of hemorrhoids  4) Nausea/Vomiting -Episodic nausea/vomiting, which only occurs in the morning.  Etiology still not entirely clear, but could be related to underlying reflux, postnasal drip/sinusitis, or combination of the 2.  Has had some improvement with PPI.  Has been seen by ENT.   Evaluation otherwise unrevealing -Conservative management given overall improvement/stability -Was seen in headache clinic for migraines with abnormal MRI brain finding.  Evaluate for overall improvement with ongoing treatment of headaches  5) History of colon polyps -Colonoscopy in 2020 with 12 mm TA -Repeat colonoscopy 2023 for ongoing polyp surveillance per guidelines   RTC in 12 months or sooner as needed  I spent 30 minutes of time, including in depth chart review, independent review of results as outlined above, communicating results with the patient directly, face-to-face time with the patient, coordinating care, and ordering studies and medications as appropriate, and documentation.    Lavena Bullion ,DO, FACG 02/11/2020, 9:58 AM

## 2020-02-11 NOTE — Patient Instructions (Addendum)
Your provider has requested that you go to the basement level for lab work at [facility] in [place] [address]. Press "B" on the elevator. The lab is located at the first door on the left as you exit the elevator.  We have sent the following medications to your pharmacy for you to pick up at your convenience: Pepcid  It was a pleasure to see you today!  Vito Cirigliano, D.O.

## 2020-02-13 LAB — GI PROFILE, STOOL, PCR
Adenovirus F 40/41: NOT DETECTED
Astrovirus: NOT DETECTED
C difficile toxin A/B: NOT DETECTED
Campylobacter: NOT DETECTED
Cryptosporidium: NOT DETECTED
Cyclospora cayetanensis: NOT DETECTED
Entamoeba histolytica: NOT DETECTED
Enteroaggregative E coli: NOT DETECTED
Enteropathogenic E coli: NOT DETECTED
Enterotoxigenic E coli: NOT DETECTED
Giardia lamblia: NOT DETECTED
Norovirus GI/GII: NOT DETECTED
Plesiomonas shigelloides: DETECTED — AB
Rotavirus A: NOT DETECTED
Salmonella: NOT DETECTED
Sapovirus: NOT DETECTED
Shiga-toxin-producing E coli: NOT DETECTED
Shigella/Enteroinvasive E coli: NOT DETECTED
Vibrio cholerae: NOT DETECTED
Vibrio: NOT DETECTED
Yersinia enterocolitica: NOT DETECTED

## 2020-02-16 ENCOUNTER — Ambulatory Visit (INDEPENDENT_AMBULATORY_CARE_PROVIDER_SITE_OTHER): Payer: 59

## 2020-02-16 ENCOUNTER — Encounter (INDEPENDENT_AMBULATORY_CARE_PROVIDER_SITE_OTHER): Payer: Self-pay | Admitting: Otolaryngology

## 2020-02-16 ENCOUNTER — Other Ambulatory Visit: Payer: Self-pay

## 2020-02-16 ENCOUNTER — Ambulatory Visit (INDEPENDENT_AMBULATORY_CARE_PROVIDER_SITE_OTHER): Payer: 59 | Admitting: Otolaryngology

## 2020-02-16 VITALS — Temp 98.1°F

## 2020-02-16 DIAGNOSIS — J309 Allergic rhinitis, unspecified: Secondary | ICD-10-CM

## 2020-02-16 DIAGNOSIS — J310 Chronic rhinitis: Secondary | ICD-10-CM

## 2020-02-16 NOTE — Progress Notes (Addendum)
HPI: Bradley [name] is a 24 y.o. male who returns today for evaluation of nasal drainage.  He uses saline nasal rinses and sometimes blows out some bloody mucus.  He is also recently had a Covid test and was worried about clear drainage from his nose as he heard of one patient having a CSF leak status post Covid test. He has been prescribed use of mupirocin 2% ointment in the nose because of some bleeding and crusting on the septum.  This was prescribed by Dr. Redmond Baseman.. Patient apparently had a CT scan of his sinuses performed in January at [facility] that according to the patient showed clear paranasal sinuses but 1 enlarged turbinate.  Past Medical History:  Diagnosis Date  . Anxiety   . Chronic headaches   . Clavicular fracture   . History of chickenpox   . Mild intermittent asthma 06/07/2018  . Shingles    24 years old.   Past Surgical History:  Procedure Laterality Date  . CYSTECTOMY  01/2019   scrotum area   . shoulder surgery Right 08/26/2019  . WISDOM TOOTH EXTRACTION     Social History   Socioeconomic History  . Marital status: Single    Spouse name: Not on file  . Number of children: 0  . Years of education: College  . Highest education level: Not on file  Occupational History  . Occupation: Sales executive  Tobacco Use  . Smoking status: Never Smoker  . Smokeless tobacco: Never Used  Substance and Sexual Activity  . Alcohol use: Not Currently  . Drug use: Yes    Types: Marijuana    Comment: rare use  . Sexual activity: Yes    Partners: Female  Other Topics Concern  . Not on file  Social History Narrative   Lives alone   Caffeine use: 1 cup coffee per day   Right handed    Social Determinants of Health   Financial Resource Strain:   . Difficulty of Paying Living Expenses:   Food Insecurity:   . Worried About Charity fundraiser in the Last Year:   . Arboriculturist in the Last Year:   Transportation Needs:   . Film/video editor (Medical):   Marland Kitchen Lack of  Transportation (Non-Medical):   Physical Activity:   . Days of Exercise per Week:   . Minutes of Exercise per Session:   Stress:   . Feeling of Stress :   Social Connections:   . Frequency of Communication with Friends and Family:   . Frequency of Social Gatherings with Friends and Family:   . Attends Religious Services:   . Active Member of Clubs or Organizations:   . Attends Archivist Meetings:   Marland Kitchen Marital Status:    Family History  Problem Relation Age of Onset  . Hypertension Mother   . Colon polyps Mother        might be mistakrn  . Diabetes Father        type II  . Migraines Sister   . Colon cancer Neg Hx   . Esophageal cancer Neg Hx   . Rectal cancer Neg Hx   . Stomach cancer Neg Hx    Allergies  Allergen Reactions  . Shellfish Allergy Anaphylaxis  . Hydrocodone Itching    Pt states he feels itchy after taking, but it doesn't preclude him taking hydrocodone if needed   Prior to Admission medications   Medication Sig Start Date End Date Taking? Authorizing Provider  busPIRone (BUSPAR) 5 MG tablet TAKE 1 TABLET BY MOUTH TWICE A DAY 12/22/19  Yes Brunetta Jeans, PA-C  clonazePAM (KLONOPIN) 0.5 MG disintegrating tablet Take 1 tablet (0.5 mg total) by mouth 2 (two) times daily as needed. 01/14/20  Yes Brunetta Jeans, PA-C  famotidine (PEPCID) 40 MG tablet Take 1 tablet (40 mg total) by mouth at bedtime. 02/11/20  Yes Cirigliano, Vito V, DO  levocetirizine (XYZAL) 5 MG tablet Take 5 mg by mouth daily.   Yes [provider]  rizatriptan (MAXALT) 10 MG tablet Take 10 mg by mouth as needed for migraine. May repeat in 2 hours if needed   Yes [provider]  VENTOLIN HFA 108 (90 Base) MCG/ACT inhaler Inhale 2 puffs into the lungs every 4 (four) hours as needed. 10/03/19  Yes [provider]     Positive ROS: Otherwise negative  All other systems have been reviewed and were otherwise negative with the exception of those mentioned in the  HPI and as above.  Physical Exam: Constitutional: Alert, well-appearing, no acute distress Ears: External ears without lesions or tenderness. Ear canals are clear bilaterally. Nasal: External nose without lesions. Septum midline with mild rhinitis.  Minimal crusting along the septum with no evidence of recent bleeding noted.. Clear nasal passages otherwise.  I was able to place a Q-tip through the left nostril without difficulty back to the nasopharynx.  Both middle meatus regions were clear with no active drainage noted.  No clear mucus discharge from his nose noted in the office.  He did have moderate clear mucus within both nasal cavities. Oral: Lips and gums without lesions. Tongue and palate mucosa without lesions. Posterior oropharynx clear. Neck: No palpable adenopathy or masses Respiratory: Breathing comfortably  Skin: No facial/neck lesions or rash noted.  Procedures  Assessment: Chronic rhinitis  Plan: Recommended use of saline irrigation as he is doing.  Would recommend use of mupirocin 2% ointment for a week. Reviewed with him concerning use of nasal steroid spray and spraying it toward the corner of the eye versus toward the septum as this may irritate the septum.  But only minimal crusting along the septum in the office today. Reassured him of no evidence of CSF leak or intranasal abnormalities.  He will follow-up as needed.   Radene Journey, MD

## 2020-02-17 ENCOUNTER — Ambulatory Visit (INDEPENDENT_AMBULATORY_CARE_PROVIDER_SITE_OTHER): Payer: 59 | Admitting: Otolaryngology

## 2020-02-20 ENCOUNTER — Ambulatory Visit (INDEPENDENT_AMBULATORY_CARE_PROVIDER_SITE_OTHER): Payer: 59 | Admitting: Family Medicine

## 2020-02-20 ENCOUNTER — Encounter: Payer: Self-pay | Admitting: Family Medicine

## 2020-02-20 ENCOUNTER — Other Ambulatory Visit: Payer: Self-pay

## 2020-02-20 DIAGNOSIS — M545 Low back pain, unspecified: Secondary | ICD-10-CM

## 2020-02-20 NOTE — Progress Notes (Signed)
[facility] [address] [place] Phone: [phone] Subjective:   I Bradley [name] am serving as a Education administrator for Dr. Hulan Saas.  This visit occurred during the SARS-CoV-2 public health emergency.  Safety protocols were in place, including screening questions prior to the visit, additional usage of staff PPE, and extensive cleaning of exam room while observing appropriate contact time as indicated for disinfecting solutions.   I'm seeing this patient by the request  of:  Brunetta Jeans, PA-C  CC:   [id number]   12/18/2019 Midline tenderness.  This is a undiagnosed new problem.  Uncertain prognosis.  Patient did have a CT scan for renal stone that was unremarkable when I review it independently do not see any significant arthritic changes.  Do not feel with it being midline and no radicular symptoms at other advanced imaging is warranted at this time.  Meloxicam, Zanaflex, prescribed today.  Home exercises given and work with Product/process development scientist.  Discussed with patient high impact exercises secondary to patient's body habitus may not be the best and we discussed augmenting certain workouts.  Awaiting other x-rays from outside facility.  Follow-up again in 4 to 8 weeks  02/20/2020 Bradley [name] is a 24 y.o. male coming in with complaint of back pain. Patient states he is doing well. Still having some pain but making improvements.  Patient states that he is approximately 80% better overall.  Been doing the exercises.  Taking the medications occasionally.  Patient feels that it as he watches his positioning at work.  Seems to be better.  Zanaflex has been very helpful and has actually almost discontinue the meloxicam at this time     Past Medical History:  Diagnosis Date  . Anxiety   . Chronic headaches   . Clavicular fracture   . History of chickenpox   . Mild intermittent asthma 06/07/2018  . Shingles    24 years old.   Past Surgical  History:  Procedure Laterality Date  . CYSTECTOMY  01/2019   scrotum area   . shoulder surgery Right 08/26/2019  . WISDOM TOOTH EXTRACTION     Social History   Socioeconomic History  . Marital status: Single    Spouse name: Not on file  . Number of children: 0  . Years of education: College  . Highest education level: Not on file  Occupational History  . Occupation: Sales executive  Tobacco Use  . Smoking status: Never Smoker  . Smokeless tobacco: Never Used  Substance and Sexual Activity  . Alcohol use: Not Currently  . Drug use: Yes    Types: Marijuana    Comment: rare use  . Sexual activity: Yes    Partners: Female  Other Topics Concern  . Not on file  Social History Narrative   Lives alone   Caffeine use: 1 cup coffee per day   Right handed    Social Determinants of Health   Financial Resource Strain:   . Difficulty of Paying Living Expenses:   Food Insecurity:   . Worried About Charity fundraiser in the Last Year:   . Arboriculturist in the Last Year:   Transportation Needs:   . Film/video editor (Medical):   Marland Kitchen Lack of Transportation (Non-Medical):   Physical Activity:   . Days of Exercise per Week:   . Minutes of Exercise per Session:   Stress:   . Feeling of Stress :   Social Connections:   .  Frequency of Communication with Friends and Family:   . Frequency of Social Gatherings with Friends and Family:   . Attends Religious Services:   . Active Member of Clubs or Organizations:   . Attends Archivist Meetings:   Marland Kitchen Marital Status:    Allergies  Allergen Reactions  . Shellfish Allergy Anaphylaxis  . Hydrocodone Itching    Pt states he feels itchy after taking, but it doesn't preclude him taking hydrocodone if needed   Family History  Problem Relation Age of Onset  . Hypertension Mother   . Colon polyps Mother        might be mistakrn  . Diabetes Father        type II  . Migraines Sister   . Colon cancer Neg Hx   . Esophageal  cancer Neg Hx   . Rectal cancer Neg Hx   . Stomach cancer Neg Hx       Current Outpatient Medications (Respiratory):  .  levocetirizine (XYZAL) 5 MG tablet, Take 5 mg by mouth daily. .  VENTOLIN HFA 108 (90 Base) MCG/ACT inhaler, Inhale 2 puffs into the lungs every 4 (four) hours as needed.  Current Outpatient Medications (Analgesics):  .  rizatriptan (MAXALT) 10 MG tablet, Take 10 mg by mouth as needed for migraine. May repeat in 2 hours if needed   Current Outpatient Medications (Other):  .  busPIRone (BUSPAR) 5 MG tablet, TAKE 1 TABLET BY MOUTH TWICE A DAY .  clonazePAM (KLONOPIN) 0.5 MG disintegrating tablet, Take 1 tablet (0.5 mg total) by mouth 2 (two) times daily as needed. .  famotidine (PEPCID) 40 MG tablet, Take 1 tablet (40 mg total) by mouth at bedtime.   Reviewed prior external information including notes and imaging from  primary care provider As well as notes that were available from care everywhere and other healthcare systems.  Past medical history, social, surgical and family history all reviewed in electronic medical record.  No pertanent information unless stated regarding to the chief complaint.   Review of Systems:  No headache, visual changes, nausea, vomiting, diarrhea, constipation, dizziness, abdominal pain, skin rash, fevers, chills, night sweats, weight loss, swollen lymph nodes, body aches, joint swelling, chest pain, shortness of breath, mood changes. POSITIVE muscle aches  Objective  Blood pressure 100/84, pulse 65, height 6\' 6"  (1.981 m), weight (!) 345 lb (156.5 kg), SpO2 98 %.   General: No apparent distress alert and oriented x3 mood and affect normal, dressed appropriately.  Overweight HEENT: Pupils equal, extraocular movements intact  Respiratory: Patient's speak in full sentences and does not appear short of breath  Cardiovascular: No lower extremity edema, non tender, no erythema  Neuro: Cranial nerves II through XII are intact,  neurovascularly intact in all extremities with 2+ DTRs and 2+ pulses.  Gait normal with good balance and coordination.  MSK:  Non tender with full range of motion and good stability and symmetric strength and tone of shoulders, elbows, wrist, hip, knee and ankles bilaterally.  Back exam still some mild tightness but improvement in range of motion.  Patient seems much more comfortable sitting.   Impression and Recommendations:      The above documentation has been reviewed and is accurate and complete Lyndal Pulley, DO       Note: This dictation was prepared with Dragon dictation along with smaller phrase technology. Any transcriptional errors that result from this process are unintentional.

## 2020-02-20 NOTE — Patient Instructions (Addendum)
Good to see you Overall much better Watch posture and adjustable standing desk See me again in 2 months just in case

## 2020-02-20 NOTE — Assessment & Plan Note (Signed)
Low back pain, multifactorial.  Patient has been doing relatively well already with the conservative therapy in the short course of medications.  Patient encouraged to continue the home exercise, worsening pain will consider further treatment such as physical therapy and other medications but I believe patient will do well.  Follow-up with me again in 2 to 3 months

## 2020-02-25 ENCOUNTER — Encounter: Payer: Self-pay | Admitting: Physician Assistant

## 2020-02-27 ENCOUNTER — Other Ambulatory Visit: Payer: Self-pay

## 2020-02-27 ENCOUNTER — Other Ambulatory Visit: Payer: Self-pay | Admitting: Physician Assistant

## 2020-02-27 ENCOUNTER — Encounter: Payer: Self-pay | Admitting: Physician Assistant

## 2020-02-27 ENCOUNTER — Telehealth (INDEPENDENT_AMBULATORY_CARE_PROVIDER_SITE_OTHER): Payer: 59 | Admitting: Physician Assistant

## 2020-02-27 DIAGNOSIS — L290 Pruritus ani: Secondary | ICD-10-CM

## 2020-02-27 DIAGNOSIS — F411 Generalized anxiety disorder: Secondary | ICD-10-CM

## 2020-02-27 DIAGNOSIS — F419 Anxiety disorder, unspecified: Secondary | ICD-10-CM

## 2020-02-27 NOTE — Telephone Encounter (Signed)
Patient would like his Buspirone 5mg  called into [facility] [place]

## 2020-02-27 NOTE — Progress Notes (Signed)
Virtual Visit via Video   I connected with patient on 02/27/20 at 11:30 AM EDT by a video enabled telemedicine application and verified that I am speaking with the correct person using two identifiers.  Location patient: Home Location provider: [facility], Office Persons participating in the virtual visit: Patient, Provider, [place] (Patina Moore)  I discussed the limitations of evaluation and management by telemedicine and the availability of in person appointments. The patient expressed understanding and agreed to proceed.  Subjective:   HPI:   Patient presents via [place] today to discuss multiple issues.  Patient recently diagnosed with hemorrhoids, just seeing Gastroenterologist on 02/11/20 at which time anoscopy was performed to further assess. This only revealed small, internal hemorrhoids. Patient placed on regimen of sitz baths, dietary changes and anusol. Notes he is still having some itching in the area. Not sure if this is normal. Denies any new or worsening symptoms.  Patient also following up regarding anxiety levels. Is currently on a regimen of Buspirone 10 mg BID. Endorses taking as directed and noting an overall improvement in his generalized anxiety. Notes mood is stable. Very rarely having more intense, acute anxiety. Is happy with current progress. .  ROS:   See pertinent positives and negatives per HPI.  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   Hamstring injury, right, initial encounter 01/23/2019   Migraine without aura and without status migrainosus, not intractable 07/10/2018   Morbid obesity with BMI of 40.0-44.9, adult ([place]) 07/10/2018   Allergic rhinitis 06/07/2018   Mild intermittent asthma 06/07/2018   Atypical chest pain 12/21/2017    Generalized abdominal pain 12/17/2017   Anxiety 11/28/2017    Social History   Tobacco Use   Smoking status: Never Smoker   Smokeless tobacco: Never Used  Substance Use Topics   Alcohol use: Not Currently    Current Outpatient Medications:    busPIRone (BUSPAR) 5 MG tablet, TAKE 1 TABLET BY MOUTH TWICE A DAY, Disp: 180 tablet, Rfl: 0   clonazePAM (KLONOPIN) 0.5 MG disintegrating tablet, Take 1 tablet (0.5 mg total) by mouth 2 (two) times daily as needed., Disp: 30 tablet, Rfl: 0   famotidine (PEPCID) 40 MG tablet, Take 1 tablet (40 mg total) by mouth at bedtime., Disp: 90 tablet, Rfl: 5   levocetirizine (XYZAL) 5 MG tablet, Take 5 mg by mouth daily., Disp: , Rfl:    rizatriptan (MAXALT) 10 MG tablet, Take 10 mg by mouth as needed for migraine. May repeat in 2 hours if needed, Disp: , Rfl:    VENTOLIN HFA 108 (90 Base) MCG/ACT inhaler, Inhale 2 puffs into the lungs every 4 (four) hours as needed., Disp: , Rfl:   Allergies  Allergen Reactions   Shellfish Allergy Anaphylaxis   Hydrocodone Itching    Pt states he feels itchy after taking, but it doesn't preclude him taking hydrocodone if needed    Objective:   There were no vitals taken for this visit.  Patient is well-developed, well-nourished in no acute distress.  Resting comfortably at home.  Head is normocephalic, atraumatic.  No labored breathing.  Speech is clear and coherent with logical content.  Patient is alert and oriented at baseline.   Assessment and Plan:   1. Anxiety Doing well overall. Continue current regimen. Medications refilled. Follow-up every 6 months, sooner  if needed.  2. Anal pruritus Secondary to internal hemorrhoids. Discussed difficulty cleaning completely with hemorrhoids that can prompt itch. Also discussed scratching the area can make the skin more likely to itch on its own. Restart sitz baths. Continue care as directed by GI. Follow-up with them if having ongoing  issue. Leeanne Rio, PA-C 02/27/2020

## 2020-02-27 NOTE — Progress Notes (Signed)
I have discussed the procedure for the virtual visit with the patient who has given consent to proceed with assessment and treatment.  Patient is unable to collect any vital signs. Fritz Pickerel, LPN

## 2020-02-27 NOTE — Telephone Encounter (Signed)
Bradley [name] was in this morning - He said that Bradley [name] was supposed to send his [facility]one [facility]mg   [facility] [place] - He checked with the pharmacy earlier and they have not received it.  Was just calling to make sure he would have it before the weekend.

## 2020-03-01 ENCOUNTER — Telehealth: Payer: Self-pay

## 2020-03-01 ENCOUNTER — Other Ambulatory Visit: Payer: Self-pay | Admitting: Physician Assistant

## 2020-03-01 DIAGNOSIS — F411 Generalized anxiety disorder: Secondary | ICD-10-CM

## 2020-03-01 MED ORDER — BUSPIRONE HCL 10 MG PO TABS: 10.0000 mg | ORAL_TABLET | Freq: Two times a day (BID) | ORAL | 1 refills | Status: DC

## 2020-03-01 NOTE — Telephone Encounter (Signed)
Patient is aware that prescription has been sent to pharmacy

## 2020-03-01 NOTE — Telephone Encounter (Signed)
Patient called saying that his prescription for Buspirone 5mg  is incorrect.    It should be Buspirone 10mg  2 tabs BID.  He said that he is currently out.

## 2020-03-01 NOTE — Telephone Encounter (Signed)
Thank you for the clarification. Updated Rx has been sent to his pharmacy. Should be able to pick up this evening.

## 2020-03-04 ENCOUNTER — Ambulatory Visit (INDEPENDENT_AMBULATORY_CARE_PROVIDER_SITE_OTHER): Payer: 59 | Admitting: Family Medicine

## 2020-03-04 ENCOUNTER — Other Ambulatory Visit: Payer: Self-pay

## 2020-03-04 ENCOUNTER — Encounter: Payer: Self-pay | Admitting: Family Medicine

## 2020-03-04 VITALS — BP 138/80 | HR 73 | Resp 17

## 2020-03-04 DIAGNOSIS — J3089 Other allergic rhinitis: Secondary | ICD-10-CM

## 2020-03-04 DIAGNOSIS — J302 Other seasonal allergic rhinitis: Secondary | ICD-10-CM

## 2020-03-04 DIAGNOSIS — H1010 Acute atopic conjunctivitis, unspecified eye: Secondary | ICD-10-CM

## 2020-03-04 DIAGNOSIS — T7800XD Anaphylactic reaction due to unspecified food, subsequent encounter: Secondary | ICD-10-CM

## 2020-03-04 DIAGNOSIS — J4520 Mild intermittent asthma, uncomplicated: Secondary | ICD-10-CM | POA: Diagnosis not present

## 2020-03-04 DIAGNOSIS — J4530 Mild persistent asthma, uncomplicated: Secondary | ICD-10-CM | POA: Diagnosis not present

## 2020-03-04 MED ORDER — AZELASTINE HCL 0.1 % NA SOLN: 2.0000 | Freq: Two times a day (BID) | NASAL | 5 refills | Status: DC

## 2020-03-04 MED ORDER — EPINEPHRINE 0.3 MG/0.3ML IJ SOAJ: 0.3000 mg | INTRAMUSCULAR | 1 refills | Status: DC | PRN

## 2020-03-04 NOTE — Addendum Note (Signed)
Addended by: Herbie Drape on: 03/04/2020 02:00 PM   Modules accepted: Orders

## 2020-03-04 NOTE — Progress Notes (Signed)
[address] [place] [address] Dept: [phone]  FOLLOW UP NOTE  Patient ID: Bradley [name], male    DOB: 12/12/1995  Age: 24 y.o. MRN: [id number] Date of Office Visit: 03/04/2020  Assessment  Chief Complaint: Allergic Rhinitis   HPI Bradley [name] is a 24 year old male who presents to the clinic for a follow up visit. He was last seen in this clinic on 10/14/2018 by Dr. 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 At today's visit, he reports allergic rhinitis has been poorly controlled with symptoms including thick postnasal drainage which occurs mainly in the morning and clear rhinorrhea which occurs intermittently throughout the day. Allergic conjunctivitis is reported as poorly controlled with dry, itchy eyes. He is currently taking Xyzal 5 mg once a day and using Simply saline nasal spray every day. He reports that, a couple of months ago, he switched to [place] with no relief and subsequently switched back to Xyzal. He is not currently using a nasal steroid. He continues a lubricating eye drop as needed with moderate relief of symptoms. He denies shortness of breath, cough, or wheeze with rest and moderate activity. He occasionally experiences shortness of breath with vigorous activity for which he uses his albuterol with relief of symptoms. He continues to avoid shellfish with no accidental ingestion or epinephrine autoinjector use since his last visit to this clinic.   Drug Allergies:  Allergies  Allergen Reactions  . Shellfish Allergy Anaphylaxis  . Hydrocodone Itching    Pt states he feels itchy after taking, but it doesn't preclude him taking hydrocodone if needed    Physical Exam: BP 138/80 (BP Location: Right Arm, Patient Position: Sitting, Cuff Size: Large)   Pulse 73   Resp 17   SpO2 98%    Physical Exam Vitals reviewed.  Constitutional:      Appearance: Normal appearance.  HENT:     Head: Normocephalic and  atraumatic.     Right Ear: Tympanic membrane normal.     Left Ear: Tympanic membrane normal.     Nose:     Comments: Bilateral nares normal with no drainage noted. Pharynx erythematous with no exudate noted. Ears normal. Eyes normal.  Eyes:     Conjunctiva/sclera: Conjunctivae normal.  Cardiovascular:     Rate and Rhythm: Normal rate and regular rhythm.     Heart sounds: Normal heart sounds. No murmur heard.   Pulmonary:     Effort: Pulmonary effort is normal.     Breath sounds: Normal breath sounds.     Comments: Lungs clear to auscultation Musculoskeletal:        General: Normal range of motion.     Cervical back: Normal range of motion and neck supple.  Skin:    General: Skin is warm and dry.  Neurological:     Mental Status: He is alert and oriented to person, place, and time.  Psychiatric:        Mood and Affect: Mood normal.        Behavior: Behavior normal.        Thought Content: Thought content normal.        Judgment: Judgment normal.     Diagnostics: FVC 6.38, FEV1 5.23. Predicted FVC 6.98, FEV1 5.67. Spirometry indicates normal ventilatory function.  Assessment and Plan: 1. Mild intermittent asthma without complication   2. Seasonal and perennial allergic rhinitis   3. Seasonal allergic conjunctivitis   4. Anaphylactic shock  due to food, subsequent encounter     Meds ordered this encounter  Medications  . azelastine (ASTELIN) 0.1 % nasal spray    Sig: Place 2 sprays into both nostrils 2 (two) times daily.    Dispense:  30 mL    Refill:  5    Patient Instructions  Allergic rhinitis Continue allergen avoidance measures toward pollen, cat, dog, dust mites, and cockroach as listed below Stop oral antihistamine for now. Begin azelastine 2 sprays in each nostril twice a day as needed for a runny nose Continue saline nasal rinses or Simply saline spray at least daily For thick post nasal drainage, begin Mucinex 600- 1200 mg twice a day and increase hydration  as tolerated.  Allergic conjunctivitis Stop oral antihistamine as noted above Continue a lubricating eye drop as needed.  Keeping your eye drops in the refrigerator can provide a refreshing feeling For red, itchy eyes begin an allergy eye drop. Some over the counter eye drops include Pataday one drop in each eye once a day as needed for red, itchy eyes OR Zaditor one drop in each eye twice a day as needed for red itchy eyes.  Asthma Continue albuterol 2 puffs every 4 hours as needed for cough or wheeze  Food allergy Continue to avoid shellfish. In case of an allergic reaction, take Benadryl 50 mg every 4 hours, and if life-threatening symptoms occur, inject with AuviQ 0.3 mg.  Call the clinic if this treatment plan is not working well for you  Follow up in 6 months or sooner if needed.   Return in about 6 months (around 09/03/2020), or if symptoms worsen or fail to improve.    Thank you for the opportunity to care for this patient.  Please do not hesitate to contact me with questions.  Gareth Morgan, FNP [facility]

## 2020-03-04 NOTE — Patient Instructions (Addendum)
Allergic rhinitis Continue allergen avoidance measures toward pollen, cat, dog, dust mites, and cockroach as listed below Stop oral antihistamine for now. Begin azelastine 2 sprays in each nostril twice a day as needed for a runny nose Continue saline nasal rinses or Simply saline spray at least daily For thick post nasal drainage, begin Mucinex 600- 1200 mg twice a day and increase hydration as tolerated.  Allergic conjunctivitis Stop oral antihistamine as noted above Continue a lubricating eye drop as needed.  Keeping your eye drops in the refrigerator can provide a refreshing feeling For red, itchy eyes begin an allergy eye drop. Some over the counter eye drops include Pataday one drop in each eye once a day as needed for red, itchy eyes OR Zaditor one drop in each eye twice a day as needed for red itchy eyes.  Asthma Continue albuterol 2 puffs every 4 hours as needed for cough or wheeze  Food allergy Continue to avoid shellfish. In case of an allergic reaction, take Benadryl 50 mg every 4 hours, and if life-threatening symptoms occur, inject with AuviQ 0.3 mg.  Call the clinic if this treatment plan is not working well for you  Follow up in 6 months or sooner if needed.  Reducing Pollen Exposure The American Academy of Allergy, Asthma and Immunology suggests the following steps to reduce your exposure to pollen during allergy seasons. 1. Do not hang sheets or clothing out to dry; pollen may collect on these items. 2. Do not mow lawns or spend time around freshly cut grass; mowing stirs up pollen. 3. Keep windows closed at night.  Keep car windows closed while driving. 4. Minimize morning activities outdoors, a time when pollen counts are usually at their highest. 5. Stay indoors as much as possible when pollen counts or humidity is high and on windy days when pollen tends to remain in the air longer. 6. Use air conditioning when possible.  Many air conditioners have filters that  trap the pollen spores. 7. Use a HEPA room air filter to remove pollen form the indoor air you breathe.  Control of Dog or Cat Allergen Avoidance is the best way to manage a dog or cat allergy. If you have a dog or cat but don't want to find it a new home, or if your family wants a pet even though someone in the household is allergic, here are some strategies that may help keep symptoms at bay:  8. Keep the pet out of your bedroom and restrict it to only a few rooms. Be advised that keeping the dog or cat in only one room will not limit the allergens to that room. 9. Don't pet, hug or kiss the dog or cat; if you do, wash your hands with soap and water. 10. High-efficiency particulate air (HEPA) cleaners run continuously in a bedroom or living room can reduce allergen levels over time. 11. Regular use of a high-efficiency vacuum cleaner or a central vacuum can reduce allergen levels. 12. Giving your dog or cat a bath at least once a week can reduce airborne allergen.   Control of Dust Mite Allergen Dust mites play a major role in allergic asthma and rhinitis. They occur in environments with high humidity wherever human skin is found. Dust mites absorb humidity from the atmosphere (ie, they do not drink) and feed on organic matter (including shed human and animal skin). Dust mites are a microscopic type of insect that you cannot see with the naked eye. High levels  of dust mites have been detected from mattresses, pillows, carpets, upholstered furniture, bed covers, clothes, soft toys and any woven material. The principal allergen of the dust mite is found in its feces. A gram of dust may contain 1,000 mites and 250,000 fecal particles. Mite antigen is easily measured in the air during house cleaning activities. Dust mites do not bite and do not cause harm to humans, other than by triggering allergies/asthma.  Ways to decrease your exposure to dust mites in your home:  1. Encase mattresses, box  springs and pillows with a mite-impermeable barrier or cover  2. Wash sheets, blankets and drapes weekly in hot water (130 F) with detergent and dry them in a dryer on the hot setting.  3. Have the room cleaned frequently with a vacuum cleaner and a damp dust-mop. For carpeting or rugs, vacuuming with a vacuum cleaner equipped with a high-efficiency particulate air (HEPA) filter. The dust mite allergic individual should not be in a room which is being cleaned and should wait 1 hour after cleaning before going into the room.  4. Do not sleep on upholstered furniture (eg, couches).  5. If possible removing carpeting, upholstered furniture and drapery from the home is ideal. Horizontal blinds should be eliminated in the rooms where the person spends the most time (bedroom, study, television room). Washable vinyl, roller-type shades are optimal.  6. Remove all non-washable stuffed toys from the bedroom. Wash stuffed toys weekly like sheets and blankets above.  7. Reduce indoor humidity to less than 50%. Inexpensive humidity monitors can be purchased at most hardware stores. Do not use a humidifier as can make the problem worse and are not recommended.  Control of Cockroach Allergen  Cockroach allergen has been identified as an important cause of acute attacks of asthma, especially in urban settings.  There are fifty-five species of cockroach that exist in the [place], however only three, the [place], [place] species produce allergen that can affect patients with Asthma.  Allergens can be obtained from fecal particles, egg casings and secretions from cockroaches.    1. Remove food sources. 2. Reduce access to water. 3. Seal access and entry points. 4. Spray runways with 0.5-1% Diazinon or Chlorpyrifos 5. Blow boric acid power under stoves and refrigerator. 6. Place bait stations (hydramethylnon) at feeding sites.

## 2020-03-08 ENCOUNTER — Encounter: Payer: Self-pay | Admitting: Family Medicine

## 2020-03-09 ENCOUNTER — Ambulatory Visit: Payer: 59

## 2020-03-09 ENCOUNTER — Encounter: Payer: Self-pay | Admitting: Physician Assistant

## 2020-03-09 ENCOUNTER — Other Ambulatory Visit: Payer: Self-pay

## 2020-03-09 VITALS — BP 124/76

## 2020-03-09 DIAGNOSIS — R030 Elevated blood-pressure reading, without diagnosis of hypertension: Secondary | ICD-10-CM

## 2020-03-09 NOTE — Progress Notes (Signed)
Bradley [name], 24 year old male here for blood pressure check. Brought in a cuff he purchased today to make sure it was the right size. He states he went to the dentist office today and his blood pressure was elevated both times it was taken. Had patient sit for about 5 minutes before taking his blood pressure manually in the left arm. Blood pressure read 124/76. Patient informed to keep up with his blood pressure reading for his next visit with his PCP. Patient voiced understanding. Fritz Pickerel, LPN

## 2020-03-09 NOTE — Progress Notes (Signed)
LPN note reviewed. BP normotensive.   Leeanne Rio, PA-C

## 2020-03-11 ENCOUNTER — Other Ambulatory Visit: Payer: Self-pay

## 2020-03-11 ENCOUNTER — Emergency Department (INDEPENDENT_AMBULATORY_CARE_PROVIDER_SITE_OTHER)
Admission: EM | Admit: 2020-03-11 | Discharge: 2020-03-11 | Disposition: A | Discharge status: Home / Self Care | Payer: 59 | Source: Home / Self Care

## 2020-03-11 DIAGNOSIS — R197 Diarrhea, unspecified: Secondary | ICD-10-CM

## 2020-03-11 DIAGNOSIS — Z113 Encounter for screening for infections with a predominantly sexual mode of transmission: Secondary | ICD-10-CM

## 2020-03-11 DIAGNOSIS — R109 Unspecified abdominal pain: Secondary | ICD-10-CM | POA: Diagnosis not present

## 2020-03-11 LAB — POCT CBC W AUTO DIFF (K'VILLE URGENT CARE)

## 2020-03-11 NOTE — ED Triage Notes (Signed)
Pt c/o RT sided intermittent abd pain since yesterday. Also 3-4 bouts of diarrhea. Also requesting STD screening. No current STD sxs.

## 2020-03-11 NOTE — ED Provider Notes (Signed)
[name]    CSN: [id number] Arrival date & time: 03/11/20  1716      History   Chief Complaint Chief Complaint  Patient presents with  . Abdominal Pain  . STD screening    HPI Bradley [name] is a 24 y.o. male.   HPI  Bradley [name] is a 24 y.o. male presenting to UC with c/o 3-4 episodes of watery diarrhea in the last 24 hours. No blood or mucous in the stool. First episode was about 30 minutes after eating at [employer].  Associated generalized abdominal cramping, worse in [place].  He reports having scans of that area as recent as October 2020, which was unremarkable.  Denies fever, chills, nausea or vomiting. Denies urinary symptoms but reports hx of unprotected sex and would like to be screened for STIs.  No specific known exposure.    Past Medical History:  Diagnosis Date  . Anxiety   . Chronic headaches   . Clavicular fracture   . History of chickenpox   . Mild intermittent asthma 06/07/2018  . Shingles    24 years old.    Patient Active Problem List   Diagnosis Date Noted  . Mild persistent asthma, uncomplicated 93/79/0240  . Seasonal allergic conjunctivitis 03/04/2020  . Low back pain 12/18/2019  . Seasonal and perennial allergic rhinitis 10/15/2019  . Shortness of breath 10/15/2019  . 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 Hamstring injury, right, initial encounter 01/23/2019  . Migraine without aura and without status migrainosus, not intractable 07/10/2018  . Morbid obesity with BMI of 40.0-44.9, adult ([place]) 07/10/2018  . Allergic rhinitis 06/07/2018  . Mild intermittent asthma 06/07/2018  . Atypical chest pain 12/21/2017  . Generalized abdominal pain 12/17/2017  . Anxiety 11/28/2017    Past Surgical History:  Procedure Laterality Date  . CYSTECTOMY  01/2019   scrotum area   . shoulder surgery Right 08/26/2019  . WISDOM TOOTH EXTRACTION         Home Medications    Prior to Admission medications   Medication  Sig Start Date End Date Taking? Authorizing Provider  azelastine (ASTELIN) 0.1 % nasal spray Place 2 sprays into both nostrils 2 (two) times daily. 03/04/20   Ambs, Kathrine Cords, FNP  busPIRone (BUSPAR) 10 MG tablet Take 1 tablet (10 mg total) by mouth 2 (two) times daily. 03/01/20   Brunetta Jeans, PA-C  clonazePAM (KLONOPIN) 0.5 MG disintegrating tablet Take 1 tablet (0.5 mg total) by mouth 2 (two) times daily as needed. 01/14/20   Brunetta Jeans, PA-C  EPINEPHrine (AUVI-Q) 0.3 mg/0.3 mL IJ SOAJ injection Inject 0.3 mLs (0.3 mg total) into the muscle as needed for anaphylaxis. 03/04/20   Dara Hoyer, FNP  famotidine (PEPCID) 40 MG tablet Take 1 tablet (40 mg total) by mouth at bedtime. Patient not taking: Reported on 03/04/2020 02/11/20   Cirigliano, Vito V, DO  levocetirizine (XYZAL) 5 MG tablet Take 5 mg by mouth daily.    [provider]  omeprazole (PRILOSEC) 40 MG capsule Take 40 mg by mouth daily.    [provider]  rizatriptan (MAXALT) 10 MG tablet Take 10 mg by mouth as needed for migraine. May repeat in 2 hours if needed    [provider]  VENTOLIN HFA 108 (90 Base) MCG/ACT inhaler Inhale 2 puffs into the lungs every 4 (four) hours as needed. 10/03/19   [provider]    Family History Family History  Problem Relation  Age of Onset  . Hypertension Mother   . Colon polyps Mother        might be mistakrn  . Diabetes Father        type II  . Migraines Sister   . Colon cancer Neg Hx   . Esophageal cancer Neg Hx   . Rectal cancer Neg Hx   . Stomach cancer Neg Hx     Social History Social History   Tobacco Use  . Smoking status: Never Smoker  . Smokeless tobacco: Never Used  Vaping Use  . Vaping Use: Never used  Substance Use Topics  . Alcohol use: Not Currently  . Drug use: Yes    Types: Marijuana    Comment: rare use     Allergies   Shellfish allergy and Hydrocodone   Review of Systems Review of Systems  Constitutional: Negative  for chills, fever and unexpected weight change.  Gastrointestinal: Positive for abdominal pain and diarrhea. Negative for abdominal distention, blood in stool, constipation, nausea and vomiting.  Genitourinary: Negative for discharge, dysuria, flank pain, frequency, genital sores, hematuria, testicular pain and urgency.     Physical Exam Triage Vital Signs ED Triage Vitals  Enc Vitals Group     BP 03/11/20 1727 137/86     Pulse Rate 03/11/20 1727 77     Resp 03/11/20 1727 18     Temp 03/11/20 1727 97.7 F (36.5 C)     Temp Source 03/11/20 1727 Oral     SpO2 03/11/20 1727 98 %     Weight --      Height --      Head Circumference --      Peak Flow --      Pain Score 03/11/20 1729 4     Pain Loc --      Pain Edu? --      Excl. in [place]? --    No data found.  Updated Vital Signs BP 137/86 (BP Location: Right Arm)   Pulse 77   Temp 97.7 F (36.5 C) (Oral)   Resp 18   SpO2 98%   Visual Acuity Right Eye Distance:   Left Eye Distance:   Bilateral Distance:    Right Eye Near:   Left Eye Near:    Bilateral Near:     Physical Exam Vitals and nursing note reviewed.  Constitutional:      General: He is not in acute distress.    Appearance: He is well-developed. He is obese. He is not ill-appearing, toxic-appearing or diaphoretic.  HENT:     Head: Normocephalic and atraumatic.  Cardiovascular:     Rate and Rhythm: Normal rate and regular rhythm.  Pulmonary:     Effort: Pulmonary effort is normal. No respiratory distress.     Breath sounds: Normal breath sounds.  Abdominal:     General: There is no distension.     Palpations: Abdomen is soft.     Tenderness: There is abdominal tenderness. There is no right CVA tenderness, left CVA tenderness, guarding or rebound. Negative signs include Murphy's sign and McBurney's sign.  Musculoskeletal:        General: Normal range of motion.     Cervical back: Normal range of motion.  Skin:    General: Skin is warm and dry.    Neurological:     Mental Status: He is alert and oriented to person, place, and time.  Psychiatric:        Behavior: Behavior normal.  UC Treatments / Results  Labs (all labs ordered are listed, but only abnormal results are displayed) Labs Reviewed  C. TRACHOMATIS/N. GONORRHOEAE RNA  COMPLETE METABOLIC PANEL WITH GFR  LIPASE  HIV ANTIBODY (ROUTINE TESTING W REFLEX)  RPR  POCT CBC W AUTO DIFF ([place])    EKG   Radiology No results found.  Procedures Procedures (including critical care time)  Medications Ordered in UC Medications - No data to display  Initial Impression / Assessment and Plan / UC Course  I have reviewed the triage vital signs and the nursing notes.  Pertinent labs & imaging results that were available during my care of the patient were reviewed by me and considered in my medical decision making (see chart for details).     CBC: unremarkable Generalized mild abdominal tenderness w/o evidence of acute abdomen. CMP and Lipase: pending Pt is hemodynamically stable  Diarrhea likely viral in nature. Encouraged conservative tx  Urine and blood sent to lab for GC/chlamydia, HIV, and syphilis testing. F/u with PCP as needed AVS provided   Final Clinical Impressions(s) / UC Diagnoses   Final diagnoses:  Screening for STD (sexually transmitted disease)  Abdominal cramping  Diarrhea, unspecified type     Discharge Instructions      Begin Pedialye for about 12 to 18 hours until diarrhea stops, then switch to clear liquids (apple juice, clear grape juice, Jello, etc) for about 12 to 18 hours.  When improved, advance to a [employer] (Bananas, Rice, Applesauce, Toast). Then gradually resume a regular diet when tolerated.  Avoid milk products until well.  When stools become more formed, may take Imodium (loperamide) once or twice daily to decrease stool frequency.  If symptoms become significantly worse during the night or over the  weekend, proceed to the local emergency room.  Call to schedule a follow up appointment with your primary care provider next week if not improving.     ED Prescriptions    None     PDMP not reviewed this encounter.   Noe Gens, [place] 03/11/20 1835

## 2020-03-11 NOTE — Discharge Instructions (Signed)
  Begin Pedialye for about 12 to 18 hours until diarrhea stops, then switch to clear liquids (apple juice, clear grape juice, Jello, etc) for about 12 to 18 hours.  When improved, advance to a [employer] (Bananas, Rice, Applesauce, Toast). Then gradually resume a regular diet when tolerated.  Avoid milk products until well.  When stools become more formed, may take Imodium (loperamide) once or twice daily to decrease stool frequency.  If symptoms become significantly worse during the night or over the weekend, proceed to the local emergency room.  Call to schedule a follow up appointment with your primary care provider next week if not improving.

## 2020-03-12 ENCOUNTER — Encounter: Payer: Self-pay | Admitting: Physician Assistant

## 2020-03-12 ENCOUNTER — Ambulatory Visit (INDEPENDENT_AMBULATORY_CARE_PROVIDER_SITE_OTHER): Payer: 59

## 2020-03-12 DIAGNOSIS — J309 Allergic rhinitis, unspecified: Secondary | ICD-10-CM | POA: Diagnosis not present

## 2020-03-12 LAB — C. TRACHOMATIS/N. GONORRHOEAE RNA
C. trachomatis RNA, TMA: NOT DETECTED
N. gonorrhoeae RNA, TMA: NOT DETECTED

## 2020-03-12 LAB — COMPLETE METABOLIC PANEL WITH GFR
AG Ratio: 1.8 (calc) (ref 1.0–2.5)
ALT: 14 U/L (ref 9–46)
AST: 13 U/L (ref 10–40)
Albumin: 4.7 g/dL (ref 3.6–5.1)
Alkaline phosphatase (APISO): 66 U/L (ref 36–130)
BUN: 11 mg/dL (ref 7–25)
CO2: 24 mmol/L (ref 20–32)
Calcium: 9.4 mg/dL (ref 8.6–10.3)
Chloride: 104 mmol/L (ref 98–110)
Creat: 0.73 mg/dL (ref 0.60–1.35)
GFR, Est African American: 150 mL/min/{1.73_m2} (ref >=60)
GFR, Est Non African American: 130 mL/min/{1.73_m2} (ref >=60)
Globulin: 2.6 g/dL (calc) (ref 1.9–3.7)
Glucose, Bld: 82 mg/dL (ref 65–99)
Potassium: 4 mmol/L (ref 3.5–5.3)
Sodium: 137 mmol/L (ref 135–146)
Total Bilirubin: 0.6 mg/dL (ref 0.2–1.2)
Total Protein: 7.3 g/dL (ref 6.1–8.1)

## 2020-03-12 LAB — LIPASE: Lipase: 12 U/L (ref 7–60)

## 2020-03-12 LAB — HIV ANTIBODY (ROUTINE TESTING W REFLEX): HIV 1&2 Ab, 4th Generation: NONREACTIVE

## 2020-03-12 LAB — RPR: RPR Ser Ql: NONREACTIVE

## 2020-03-12 NOTE — Progress Notes (Signed)
Spoke with patient in regards to his vials expiring on 05/11/2020. Patient had something's going on and got off schedule but will start coming back once weekly and has given the ok to order his vial set. I informed him that it depends on how far he gets with his gold vial if we will have to order s two set or three set. Patient verbalized understanding and stated either way it is fine to order.

## 2020-03-23 ENCOUNTER — Emergency Department (INDEPENDENT_AMBULATORY_CARE_PROVIDER_SITE_OTHER)
Admission: EM | Admit: 2020-03-23 | Discharge: 2020-03-23 | Disposition: A | Discharge status: Home / Self Care | Payer: 59 | Source: Home / Self Care | Attending: Family Medicine | Admitting: Family Medicine

## 2020-03-23 ENCOUNTER — Other Ambulatory Visit: Payer: Self-pay

## 2020-03-23 DIAGNOSIS — R202 Paresthesia of skin: Secondary | ICD-10-CM

## 2020-03-23 NOTE — ED Provider Notes (Signed)
[name]    CSN: [id number] Arrival date & time: 03/23/20  1719      History   Chief Complaint Chief Complaint  Patient presents with  . Skin Problem    HPI Bradley [name] is a 24 y.o. male.   Two days ago patient developed intermittent vague burning sensation in his left face below his eye and bridge of nose without a rash present.  There is no erythema, swelling, or tenderness to palpation.  He states that he had had his nose cauterized and started on new eye drops several days prior.  He feels well otherwise. He states that he had shingles on his back at age 54.  The history is provided by the patient.    Past Medical History:  Diagnosis Date  . Anxiety   . Chronic headaches   . Clavicular fracture   . History of chickenpox   . Mild intermittent asthma 06/07/2018  . Shingles    24 years old.    Patient Active Problem List   Diagnosis Date Noted  . Mild persistent asthma, uncomplicated 07/19/8526  . Seasonal allergic conjunctivitis 03/04/2020  . Low back pain 12/18/2019  . Seasonal and perennial allergic rhinitis 10/15/2019  . Shortness of breath 10/15/2019  . 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 Hamstring injury, right, initial encounter 01/23/2019  . Migraine without aura and without status migrainosus, not intractable 07/10/2018  . Morbid obesity with BMI of 40.0-44.9, adult ([place]) 07/10/2018  . Allergic rhinitis 06/07/2018  . Mild intermittent asthma 06/07/2018  . Atypical chest pain 12/21/2017  . Generalized abdominal pain 12/17/2017  . Anxiety 11/28/2017    Past Surgical History:  Procedure Laterality Date  . CYSTECTOMY  01/2019   scrotum area   . shoulder surgery Right 08/26/2019  . WISDOM TOOTH EXTRACTION         Home Medications    Prior to Admission medications   Medication Sig Start Date End Date Taking? Authorizing Provider  azelastine (ASTELIN) 0.1 % nasal spray Place 2 sprays into both nostrils  2 (two) times daily. Patient not taking: Reported on 03/24/2020 03/04/20   Dara Hoyer, FNP  EPINEPHrine (AUVI-Q) 0.3 mg/0.3 mL IJ SOAJ injection Inject 0.3 mLs (0.3 mg total) into the muscle as needed for anaphylaxis. 03/04/20   Dara Hoyer, FNP  fexofenadine (ALLEGRA) 180 MG tablet Take 180 mg by mouth daily.    [provider]  omeprazole (PRILOSEC) 20 MG capsule Take 20 mg by mouth daily.    [provider]  rizatriptan (MAXALT) 10 MG tablet Take 10 mg by mouth as needed for migraine. May repeat in 2 hours if needed    [provider]  sertraline (ZOLOFT) 50 MG tablet Take 1/2 tablet daily x 2 weeks. Then increase to 1 tablet daily. 03/24/20   Brunetta Jeans, PA-C  VENTOLIN HFA 108 (90 Base) MCG/ACT inhaler Inhale 2 puffs into the lungs every 4 (four) hours as needed. Patient not taking: Reported on 03/24/2020 10/03/19   [provider]  Vitamin D, Ergocalciferol, (DRISDOL) 1.25 MG (50000 UNIT) CAPS capsule Take 50,000 Units by mouth once a week. 03/22/20   [provider]    Family History Family History  Problem Relation Age of Onset  . Hypertension Mother   . Colon polyps Mother        might be mistakrn  . Diabetes Father        type II  . Migraines Sister   .  Colon cancer Neg Hx   . Esophageal cancer Neg Hx   . Rectal cancer Neg Hx   . Stomach cancer Neg Hx     Social History Social History   Tobacco Use  . Smoking status: Never Smoker  . Smokeless tobacco: Never Used  Vaping Use  . Vaping Use: Never used  Substance Use Topics  . Alcohol use: Not Currently  . Drug use: Yes    Types: Marijuana    Comment: rare use     Allergies   Shellfish allergy and Hydrocodone   Review of Systems Review of Systems  Constitutional: Negative for activity change, chills, diaphoresis, fatigue and fever.  HENT: Negative for congestion, ear pain, facial swelling, rhinorrhea, sinus pressure, sinus pain, sore throat and tinnitus.   Eyes:  Negative.   Respiratory: Negative.   Cardiovascular: Negative.   Gastrointestinal: Negative.   Genitourinary: Negative.   Musculoskeletal: Negative.   Skin: Negative for color change and rash.  Neurological: Negative for facial asymmetry, light-headedness, numbness and headaches.     Physical Exam Triage Vital Signs ED Triage Vitals  Enc Vitals Group     BP 03/23/20 1731 (!) 141/83     Pulse Rate 03/23/20 1731 84     Resp 03/23/20 1731 16     Temp 03/23/20 1731 98.6 F (37 C)     Temp Source 03/23/20 1731 Oral     SpO2 03/23/20 1731 98 %     Weight --      Height --      Head Circumference --      Peak Flow --      Pain Score 03/23/20 1730 0     Pain Loc --      Pain Edu? --      Excl. in [place]? --    No data found.  Updated Vital Signs BP (!) 141/83 (BP Location: Right Arm)   Pulse 84   Temp 98.6 F (37 C) (Oral)   Resp 16   SpO2 98%   Visual Acuity Right Eye Distance:   Left Eye Distance:   Bilateral Distance:    Right Eye Near:   Left Eye Near:    Bilateral Near:     Physical Exam Vitals and nursing note reviewed.  Constitutional:      General: He is not in acute distress. HENT:     Head: Normocephalic.      Comments: Patient has intermittent vague burning sensation in left face as outlined on diagram, but there is no rash or tenderness to palpation in this area.    Right Ear: Tympanic membrane, ear canal and external ear normal.     Left Ear: Tympanic membrane, ear canal and external ear normal.     Nose: Nose normal.     Mouth/Throat:     Mouth: Mucous membranes are moist.     Pharynx: Oropharynx is clear.  Eyes:     Conjunctiva/sclera: Conjunctivae normal.  Cardiovascular:     Rate and Rhythm: Normal rate.     Heart sounds: Normal heart sounds.  Pulmonary:     Breath sounds: Normal breath sounds.  Abdominal:     Tenderness: There is no abdominal tenderness.  Musculoskeletal:     Cervical back: Neck supple.     Right lower leg: No edema.      Left lower leg: No edema.  Lymphadenopathy:     Cervical: No cervical adenopathy.  Skin:    General: Skin is warm and dry.  Coloration: Skin is not pale.     Findings: No rash.  Neurological:     General: No focal deficit present.     Mental Status: He is alert and oriented to person, place, and time.      UC Treatments / Results  Labs (all labs ordered are listed, but only abnormal results are displayed) Labs Reviewed - No data to display  EKG   Radiology No results found.  Procedures Procedures (including critical care time)  Medications Ordered in UC Medications - No data to display  Initial Impression / Assessment and Plan / UC Course  I have reviewed the triage vital signs and the nursing notes.  Pertinent labs & imaging results that were available during my care of the patient were reviewed by me and considered in my medical decision making (see chart for details).    Normal exam; possible early herpes zoster. Advised to call if rash develops (would begin Valtrex). Followup with ENT if symptoms persist.   Final Clinical Impressions(s) / UC Diagnoses   Final diagnoses:  Facial paresthesia   Discharge Instructions   None    ED Prescriptions    None        Kandra Nicolas, MD 03/26/20 [phone]

## 2020-03-23 NOTE — ED Triage Notes (Signed)
Patient presents to Urgent Care with complaints of skin burning sensation around his left eye and up onto the bridge of his nose since the past few days; lasts approx 15 seconds and is intermittent. Patient reports he recently had his nose cauterized and started some new eye drops. Pt denies eye involvement or vision changes.

## 2020-03-24 ENCOUNTER — Telehealth (INDEPENDENT_AMBULATORY_CARE_PROVIDER_SITE_OTHER): Payer: 59 | Admitting: Physician Assistant

## 2020-03-24 ENCOUNTER — Encounter: Payer: Self-pay | Admitting: Physician Assistant

## 2020-03-24 DIAGNOSIS — F419 Anxiety disorder, unspecified: Secondary | ICD-10-CM | POA: Diagnosis not present

## 2020-03-24 MED ORDER — SERTRALINE HCL 50 MG PO TABS: ORAL_TABLET | ORAL | 3 refills | Status: DC

## 2020-03-24 NOTE — Progress Notes (Signed)
Virtual Visit via Video   I connected with patient on 03/24/20 at  2:30 PM EDT by a video enabled telemedicine application and verified that I am speaking with the correct person using two identifiers.  Location patient: Home Location provider: [facility], Office Persons participating in the virtual visit: Patient, Provider, [place] (Patina Moore)  I discussed the limitations of evaluation and management by telemedicine and the availability of in person appointments. The patient expressed understanding and agreed to proceed.  Subjective:   HPI:   Patient presents via [place] today for follow-up regarding chronic anxiety.  Patient currently on a regimen of BuSpar 10 mg twice daily.  Patient endorses taking as directed.  Notes that has helped some but still noting significant generalized anxiety with occasional episodes of more acute anxiety.  Notes during episodes of acute anxiety he occasionally takes one of his Klonopin.  Notes most of the time he is able to calm himself down without any additional medication.  Patient denies depressed mood or anhedonia.  Denies difficulty with sleeping.  Notes appetite is stable.    ROS:   See pertinent positives and negatives per HPI.  Patient Active Problem List   Diagnosis Date Noted  . Mild persistent asthma, uncomplicated 02/54/2706  . Seasonal allergic conjunctivitis 03/04/2020  . Low back pain 12/18/2019  . Seasonal and perennial allergic rhinitis 10/15/2019  . Shortness of breath 10/15/2019  . 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 Hamstring injury, right, initial encounter 01/23/2019  . Migraine without aura and without status migrainosus, not intractable 07/10/2018  . Morbid obesity with BMI of 40.0-44.9, adult ([place]) 07/10/2018  . Allergic rhinitis 06/07/2018  . Mild intermittent asthma 06/07/2018  . Atypical chest pain 12/21/2017  . Generalized abdominal pain 12/17/2017  . Anxiety 11/28/2017      Social History   Tobacco Use  . Smoking status: Never Smoker  . Smokeless tobacco: Never Used  Substance Use Topics  . Alcohol use: Not Currently    Current Outpatient Medications:  .  EPINEPHrine (AUVI-Q) 0.3 mg/0.3 mL IJ SOAJ injection, Inject 0.3 mLs (0.3 mg total) into the muscle as needed for anaphylaxis., Disp: 2 each, Rfl: 1 .  fexofenadine (ALLEGRA) 180 MG tablet, Take 180 mg by mouth daily., Disp: , Rfl:  .  omeprazole (PRILOSEC) 20 MG capsule, Take 20 mg by mouth daily., Disp: , Rfl:  .  rizatriptan (MAXALT) 10 MG tablet, Take 10 mg by mouth as needed for migraine. May repeat in 2 hours if needed, Disp: , Rfl:  .  Vitamin D, Ergocalciferol, (DRISDOL) 1.25 MG (50000 UNIT) CAPS capsule, Take 50,000 Units by mouth once a week., Disp: , Rfl:  .  azelastine (ASTELIN) 0.1 % nasal spray, Place 2 sprays into both nostrils 2 (two) times daily. (Patient not taking: Reported on 03/24/2020), Disp: 30 mL, Rfl: 5 .  sertraline (ZOLOFT) 50 MG tablet, Take 1/2 tablet daily x 2 weeks. Then increase to 1 tablet daily., Disp: 30 tablet, Rfl: 3 .  VENTOLIN HFA 108 (90 Base) MCG/ACT inhaler, Inhale 2 puffs into the lungs every 4 (four) hours as needed. (Patient not taking: Reported on 03/24/2020), Disp: , Rfl:   Allergies  Allergen Reactions  . Shellfish Allergy Anaphylaxis  . Hydrocodone Itching    Pt states he feels itchy after taking, but it doesn't preclude him taking hydrocodone if needed    Objective:   There were no vitals taken for this visit.  Patient is well-developed, well-nourished in  no acute distress.  Resting comfortably at home.  Head is normocephalic, atraumatic.  No labored breathing.  Speech is clear and coherent with logical content.  Patient is alert and oriented at baseline.   Assessment and Plan:   1. Anxiety Counseling recommended.  Patient has contact information for this.  For now we will stop BuSpar and start Zoloft 25 mg daily x2 weeks, then increasing to 50  mg once daily.  Follow-up via video visit in 4 to 6 weeks.  Sooner if needed. - sertraline (ZOLOFT) 50 MG tablet; Take 1/2 tablet daily x 2 weeks. Then increase to 1 tablet daily.  Dispense: 30 tablet; Refill: 3    Leeanne Rio, [place] 03/24/2020

## 2020-03-25 ENCOUNTER — Encounter: Payer: Self-pay | Admitting: Physician Assistant

## 2020-03-31 ENCOUNTER — Encounter: Payer: Self-pay | Admitting: Physician Assistant

## 2020-04-01 ENCOUNTER — Encounter: Payer: Self-pay | Admitting: Nurse Practitioner

## 2020-04-01 ENCOUNTER — Ambulatory Visit (INDEPENDENT_AMBULATORY_CARE_PROVIDER_SITE_OTHER): Payer: 59 | Admitting: Nurse Practitioner

## 2020-04-01 VITALS — BP 118/80 | HR 84 | Ht 78.0 in | Wt 338.0 lb

## 2020-04-01 DIAGNOSIS — K648 Other hemorrhoids: Secondary | ICD-10-CM | POA: Diagnosis not present

## 2020-04-01 DIAGNOSIS — K6289 Other specified diseases of anus and rectum: Secondary | ICD-10-CM

## 2020-04-01 DIAGNOSIS — R1310 Dysphagia, unspecified: Secondary | ICD-10-CM

## 2020-04-01 MED ORDER — HYDROCORTISONE (PERIANAL) 2.5 % EX CREA
1.0000 | TOPICAL_CREAM | Freq: Every day | CUTANEOUS | 0 refills | Status: DC
Start: 2020-04-01 — End: 2020-04-08

## 2020-04-01 NOTE — Progress Notes (Signed)
IMPRESSION and PLAN:     Bradley [name] is a 24 y.o. male with a PMH signficant for, but not necessarily limited to,  obesity , IBS, GERD, colon polyps and headaches  # Rectal discomfort ( sensation of something "crawling' in rectum). Recently saw Dr. Bryan Lemma for this.  --He has known internal hemorrhoids.  Sounds like he used Anusol cream in the past for discomfort but was not having this crawling sensation at the time.  He is concerned about parasites.  Stool GI pathogen panel late May for similar complaints was positive for plesiomonas shigelloides but no treatment prescribed in the absence of diarrhea --Trial of Anusol cream rectum x 7 days.   # GERD --Doing well on daily Omeprazole   # Solid food dysphagia. --Feels like food is moving slowly down the esophagus though nothing has gotten stuck yet.  --Normal esophagus on EGD June 2020 done for evaluation of abdominal pain --I think repeat EGD would be low yield.  Will obtain barium swallow to look for dysmotility --Follow-up with Dr. Bryan Lemma    HPI:    Primary GI: Bradley Heck, MD   Chief complaint : Feels like something is crawling around in his rectum and swallowing problems  This patient is a 24 year old male who has been recently seen by Dr. Bryan Lemma for evaluation of multiple GI complaints including IBS, reflux, nausea and vomiting, and hemorrhoids  Patient's last visit with Dr. Bryan Lemma was 02/11/2020 at which time he complained of rectal itching, internal hemorrhoids were found on exam.  GI pathogen panel obtained and remarkable for had terrible rectal pain, went to UC. Used a cream inside rectal for 4 days given by UC for hemorrhoidal pain. It did help with the pain. At some point following that he developed a crawing sensation inside rectum. Saw Dr. 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 No blood in stool. Has been trying  to lose weight and has lost 45 pounds but some of the weight loss unintentional as appetite has been off.   Adenomatous colon polyps on colonoscopy June 2020. Five -year recall colonoscopy recommended    INTERVAL HISTORY:    He continues to have have the sensation of something "crawling" . No perianal symptoms .    Review of systems:     No chest pain, no SOB, no fevers, no urinary sx   Past Medical History:  Diagnosis Date  . Anxiety   . Chronic headaches   . Clavicular fracture   . History of chickenpox   . Mild intermittent asthma 06/07/2018  . Shingles    24 years old.    Patient's surgical history, family medical history, social history, medications and allergies were all reviewed in Epic   Serum creatinine: 0.73 mg/dL 03/11/20 1817 Estimated creatinine clearance: 234 mL/min  Current Outpatient Medications  Medication Sig Dispense Refill  . azelastine (ASTELIN) 0.1 % nasal spray Place 2 sprays into both nostrils 2 (two) times daily. 30 mL 5  . EPINEPHrine (AUVI-Q) 0.3 mg/0.3 mL IJ SOAJ injection Inject 0.3 mLs (0.3 mg total) into the muscle as needed for anaphylaxis. 2 each 1  . fexofenadine (ALLEGRA) 180 MG tablet Take 180 mg by mouth daily.    Marland Kitchen omeprazole (PRILOSEC) 20 MG capsule Take 20 mg by mouth daily.    . Rimegepant Sulfate (NURTEC PO) Take by mouth.    . VENTOLIN HFA 108 (90 Base) MCG/ACT inhaler Inhale  2 puffs into the lungs every 4 (four) hours as needed.     . Vitamin D, Ergocalciferol, (DRISDOL) 1.25 MG (50000 UNIT) CAPS capsule Take 50,000 Units by mouth once a week.     No current facility-administered medications for this visit.    Filed Weights   04/01/20 1004  Weight: (!) 338 lb (153.3 kg)    Physical Exam:     BP 118/80   Pulse 84   Ht 6\' 6"  (1.981 m)   Wt (!) 338 lb (153.3 kg)   BMI 39.06 kg/m   GENERAL:  Pleasant male in NAD PSYCH: : Cooperative, normal affect CARDIAC:  RRR PULM: Normal respiratory effort, lungs CTA bilaterally, no  wheezing ABDOMEN:  Nondistended, soft, nontender. No obvious masses, no hepatomegaly,  normal bowel sounds SKIN:  turgor, no lesions seen Musculoskeletal:  Normal muscle tone, normal strength NEURO: Alert and oriented x 3, no focal neurologic deficits   Tye Savoy , NP 04/01/2020, 10:30 AM

## 2020-04-01 NOTE — Patient Instructions (Addendum)
If you are age 25 or older, your body mass index should be between 23-30. Your Body mass index is 39.06 kg/m. If this is out of the aforementioned range listed, please consider follow up with your Primary Care Provider.  If you are age 68 or younger, your body mass index should be between 19-25. Your Body mass index is 39.06 kg/m. If this is out of the aformentioned range listed, please consider follow up with your Primary Care Provider.   You have been scheduled for a Barium Esophogram at [facility] (1st floor of the hospital) on 04/06/20 at 9:15 am. Please arrive 15 minutes prior to your appointment for registration. Make certain not to have anything to eat or drink 3 hours prior to your test. If you need to reschedule for any reason, please contact radiology at [phone] to do so. __________________________________________________________________ A barium swallow is an examination that concentrates on views of the esophagus. This tends to be a double contrast exam (barium and two liquids which, when combined, create a gas to distend the wall of the oesophagus) or single contrast (non-ionic iodine based). The study is usually tailored to your symptoms so a good history is essential. Attention is paid during the study to the form, structure and configuration of the esophagus, looking for functional disorders (such as aspiration, dysphagia, achalasia, motility and reflux) EXAMINATION You may be asked to change into a gown, depending on the type of swallow being performed. A radiologist and radiographer will perform the procedure. The radiologist will advise you of the type of contrast selected for your procedure and direct you during the exam. You will be asked to stand, sit or lie in several different positions and to hold a small amount of fluid in your mouth before being asked to swallow while the imaging is performed .In some instances you may be asked to swallow barium coated  marshmallows to assess the motility of a solid food bolus. The exam can be recorded as a digital or video fluoroscopy procedure. POST PROCEDURE It will take 1-2 days for the barium to pass through your system. To facilitate this, it is important, unless otherwise directed, to increase your fluids for the next 24-48hrs and to resume your normal diet.  This test typically takes about 30 minutes to perform. __________________________________________________________________________________  START Anusol Cream around and inside the rectum for 7 nights. This has been sent to your pharmacy.  You have been scheduled to follow up with Dr. Bryan Lemma on June 03, 2020 at 9:20 am, at the [facility]

## 2020-04-02 ENCOUNTER — Telehealth (INDEPENDENT_AMBULATORY_CARE_PROVIDER_SITE_OTHER): Payer: 59 | Admitting: Physician Assistant

## 2020-04-02 ENCOUNTER — Other Ambulatory Visit: Payer: Self-pay

## 2020-04-02 ENCOUNTER — Encounter: Payer: Self-pay | Admitting: Physician Assistant

## 2020-04-02 ENCOUNTER — Encounter: Payer: Self-pay | Admitting: Nurse Practitioner

## 2020-04-02 DIAGNOSIS — F419 Anxiety disorder, unspecified: Secondary | ICD-10-CM

## 2020-04-02 MED ORDER — CITALOPRAM HYDROBROMIDE 20 MG PO TABS
ORAL_TABLET | ORAL | 3 refills | Status: DC
Start: 2020-04-02 — End: 2020-04-20

## 2020-04-02 NOTE — Progress Notes (Signed)
Virtual Visit via Video   I connected with patient on 04/02/20 at  3:00 PM EDT by a video enabled telemedicine application and verified that I am speaking with the correct person using two identifiers.  Location patient: Home Location provider: [facility], Office Persons participating in the virtual visit: Patient, Provider, [place] (Patina Moore)  I discussed the limitations of evaluation and management by telemedicine and the availability of in person appointments. The patient expressed understanding and agreed to proceed.  Subjective:   HPI:   Patient presents via [place] today to further discuss changing medication regimen for anxiety.  Patient was recently started on a regimen of sertraline with plan to start 25 mg daily for a week or so before increasing to 20 mg daily.  Started on 25 mg this past Monday and has had significant issues with nausea daily since starting medication, now with episode of nonbloody emesis this morning.  Notes vomiting up stomach acid this morning with small amount of food.  Has history of heartburn for which he takes omeprazole daily but denies any noted increase in GERD symptoms.  Patient denies fever, chills, malaise or fatigue.  Denies abdominal pain or cramping or change to bowel habits.  Really feels like the medicine is contributing to current symptoms.  ROS:   See pertinent positives and negatives per HPI.  Patient Active Problem List   Diagnosis Date Noted  . Mild persistent asthma, uncomplicated 35/36/1443  . Seasonal allergic conjunctivitis 03/04/2020  . Low back pain 12/18/2019  . Seasonal and perennial allergic rhinitis 10/15/2019  . Shortness of breath 10/15/2019  . 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 Hamstring injury, right, initial encounter 01/23/2019  . Migraine without aura and without status migrainosus, not intractable 07/10/2018  . Morbid obesity with BMI of 40.0-44.9, adult ([place]) 07/10/2018  .  Allergic rhinitis 06/07/2018  . Mild intermittent asthma 06/07/2018  . Atypical chest pain 12/21/2017  . Generalized abdominal pain 12/17/2017  . Anxiety 11/28/2017    Social History   Tobacco Use  . Smoking status: Never Smoker  . Smokeless tobacco: Never Used  Substance Use Topics  . Alcohol use: Not Currently    Current Outpatient Medications:  .  azelastine (ASTELIN) 0.1 % nasal spray, Place 2 sprays into both nostrils 2 (two) times daily., Disp: 30 mL, Rfl: 5 .  EPINEPHrine (AUVI-Q) 0.3 mg/0.3 mL IJ SOAJ injection, Inject 0.3 mLs (0.3 mg total) into the muscle as needed for anaphylaxis., Disp: 2 each, Rfl: 1 .  fexofenadine (ALLEGRA) 180 MG tablet, Take 180 mg by mouth daily., Disp: , Rfl:  .  hydrocortisone (ANUSOL-HC) 2.5 % rectal cream, Place 1 application rectally at bedtime for 7 days., Disp: 30 g, Rfl: 0 .  omeprazole (PRILOSEC) 20 MG capsule, Take 20 mg by mouth daily., Disp: , Rfl:  .  Rimegepant Sulfate (NURTEC PO), Take by mouth., Disp: , Rfl:  .  VENTOLIN HFA 108 (90 Base) MCG/ACT inhaler, Inhale 2 puffs into the lungs every 4 (four) hours as needed. , Disp: , Rfl:  .  Vitamin D, Ergocalciferol, (DRISDOL) 1.25 MG (50000 UNIT) CAPS capsule, Take 50,000 Units by mouth once a week., Disp: , Rfl:   Allergies  Allergen Reactions  . Shellfish Allergy Anaphylaxis  . Hydrocodone Itching    Pt states he feels itchy after taking, but it doesn't preclude him taking hydrocodone if needed    Objective:   There were no vitals taken for this visit.  Patient  is well-developed, well-nourished in no acute distress.  Resting comfortably at home.  Head is normocephalic, atraumatic.  No labored breathing.  Speech is clear and coherent with logical content.  Patient is alert and oriented at baseline.   Assessment and Plan:   1. Anxiety Suspect nausea from sertraline culminating in an episode of nonbloody emesis this morning.  No recurring symptoms since that time.  We will  have him continue his GERD regimen as prescribed by gastroenterology.  We will have him add on a OTC famotidine this evening to further reduce acid until we get the sertraline out of his system.  We will stop sertraline.  We will start citalopram, 10 mg daily x 2 weeks before increasing to 20 mg daily.     Leeanne Rio, PA-C 04/02/2020

## 2020-04-02 NOTE — Progress Notes (Signed)
I have discussed the procedure for the virtual visit with the patient who has given consent to proceed with assessment and treatment.   Daleyza Gadomski S Dacoda Spallone, CMA     

## 2020-04-02 NOTE — Progress Notes (Signed)
Agree with the assessment and plan as outlined by Paula Guenther, NP. ° °Mystic Labo, DO, FACG ° °

## 2020-04-05 ENCOUNTER — Encounter: Payer: Self-pay | Admitting: Physician Assistant

## 2020-04-05 MED ORDER — PANTOPRAZOLE SODIUM 40 MG PO TBEC
40.0000 mg | DELAYED_RELEASE_TABLET | Freq: Every day | ORAL | 3 refills | Status: DC
Start: 2020-04-05 — End: 2020-06-30

## 2020-04-05 NOTE — Addendum Note (Signed)
Addended by: Brunetta Jeans on: 04/05/2020 04:04 PM   Modules accepted: Orders

## 2020-04-06 ENCOUNTER — Encounter: Payer: Self-pay | Admitting: Physician Assistant

## 2020-04-06 ENCOUNTER — Other Ambulatory Visit: Payer: Self-pay

## 2020-04-06 ENCOUNTER — Ambulatory Visit (HOSPITAL_COMMUNITY)
Admission: RE | Admit: 2020-04-06 | Discharge: 2020-04-06 | Disposition: A | Discharge status: Home / Self Care | Payer: 59 | Source: Ambulatory Visit | Attending: Nurse Practitioner | Admitting: Nurse Practitioner

## 2020-04-06 ENCOUNTER — Telehealth: Payer: Self-pay | Admitting: Physician Assistant

## 2020-04-06 ENCOUNTER — Other Ambulatory Visit: Payer: Self-pay | Admitting: Gastroenterology

## 2020-04-06 DIAGNOSIS — R1310 Dysphagia, unspecified: Secondary | ICD-10-CM

## 2020-04-06 NOTE — Telephone Encounter (Signed)
Pt called in stating that at time he feels winded, he states that this has started since taking the Celexa. He wanted to know if this could be a side effect from the medication? Pt also wanted to if he needs to come in to see Einar Pheasant for this?   Please advise

## 2020-04-07 NOTE — Telephone Encounter (Signed)
Have discussed with patient via [place]. Closing phone note.

## 2020-04-08 ENCOUNTER — Encounter: Payer: Self-pay | Admitting: Gastroenterology

## 2020-04-08 ENCOUNTER — Ambulatory Visit (INDEPENDENT_AMBULATORY_CARE_PROVIDER_SITE_OTHER): Payer: 59 | Admitting: Gastroenterology

## 2020-04-08 VITALS — BP 122/82 | HR 88 | Ht 78.0 in | Wt 336.4 lb

## 2020-04-08 DIAGNOSIS — K649 Unspecified hemorrhoids: Secondary | ICD-10-CM | POA: Diagnosis not present

## 2020-04-08 DIAGNOSIS — K582 Mixed irritable bowel syndrome: Secondary | ICD-10-CM | POA: Diagnosis not present

## 2020-04-08 DIAGNOSIS — K648 Other hemorrhoids: Secondary | ICD-10-CM

## 2020-04-08 DIAGNOSIS — R1310 Dysphagia, unspecified: Secondary | ICD-10-CM

## 2020-04-08 DIAGNOSIS — L290 Pruritus ani: Secondary | ICD-10-CM

## 2020-04-08 DIAGNOSIS — Z86010 Personal history of colonic polyps: Secondary | ICD-10-CM

## 2020-04-08 DIAGNOSIS — R110 Nausea: Secondary | ICD-10-CM

## 2020-04-08 DIAGNOSIS — K219 Gastro-esophageal reflux disease without esophagitis: Secondary | ICD-10-CM

## 2020-04-08 MED ORDER — ALBENDAZOLE 200 MG PO TABS: 400.0000 mg | ORAL_TABLET | ORAL | 0 refills | Status: DC

## 2020-04-08 NOTE — Progress Notes (Signed)
P  Chief Complaint:    Radiology study follow-up, dysphagia  GI History: 24 year old malewith a history of obesity, mild intermittent asthma, anxiety, follows in the GI clinic for:  1) IBS mixed type.Index symptoms ofabdominal pain change in bowel habits.Symptoms have been essentially lifelong, increasing in frequency/severitysince 2020. Endorses intermittent abdominal cramping, generalized abdominal pain,nausea without emesisat that time. Pain described as dull but occasionally sharpspots of pain, lasting a few seconds. Symptoms essentially resolved early 2021.  2) Reflux: Index symptoms of heartburn, regurgitation, globus sensation. Typically postprandial, but can occur at random throughout the day and early a.m. No nocturnal symptoms. Good response to trial of PPI-omeprazole 40 mg/day -Evaluated by ENT diagnosis suspected reflux, treated with omeprazole -He sought second opinion ENT consult 12/12/2019-CT with clear paranasal sinuses.  Treated with nasal spray, Afrin, saline rinse, omeprazole. -Discussed pH/impedance testing; declined -EGD 6/20 essentially normal -01/2020: Changed to Pepcid 40 mg due to patient concerns regarding PPI side effect profile- not effective, so moved back to PPI -03/2020: Changed to Protonix 40 mg  3) Nausea/vomiting: Episodic, occurs in the morning. -CT abdomen/pelvis 06/2020-normal  4) Dysphagia: New 03/2020 -Barium esophagram (03/2020): Failure to propagate any normal primary peristaltic waves.  Recommended EM -Esophageal Manometry: Ordered  5) Hemorrhoids: Index symptoms of rectal irritation, itching, pruritus ani.  No hematochezia.  Describes a sense of "something crawling" on outside.  GI PCR panel with Plesiomonas shigelloides, otherwise negative.  Anoscopy otherwise normal -04/05/2020: Seen by Colorectal surgery at [facility]  6) History of colon polyps -Colonoscopy in 2020 with 12 mm TA -Repeat colonoscopy 2023 for ongoing polyp  surveillance per guidelines  Evaluation to date: -CTabdomen/pelvis (09/2019): Normal -EGD (02/2019, Dr. Bryan Lemma): Normal with negative gastric/duodenal biopsies -Colonoscopy (02/2019, Dr. Bryan Lemma): 12 mm cecal polyp (TA), 4 mm sigmoid polyp (TA), otherwise normal with biopsies negative for MC, normal TI. Repeat in 3 years -02/2019: Normal CBC and CMP -10/2018: Normal RUQ [place] (had RUQ pain at that time) -2019: Normal CBC, CMP, TSH, negative C. difficile, stool culture  HPI:     Patient is a 24 y.o. male presenting to the Gastroenterology Clinic for follow-up.  Was last seen by me on 02/11/2020.  Since then, was seen in follow-up by Tye Savoy, NP, on 04/01/2020.  At that time, continued to complain of rectal discomfort.  Some improvement with retrial of Anusol cream.    Was also complaining of solid food dysphagia which was new.  Was sent for barium esophagram which demonstrated failure to propagate any normal primary peristaltic waves.  No esophageal mass, stricture, ring, hernia.  No reflux noted and barium tablet passed readily into the stomach.  The patient is highly concerned about these findings, and scheduled expedited follow-up appointment to discuss.  Today, he states he has "tightness" when swallowing, particularly with pills.   Was seen urgently by PCM on 04/02/2020 for treatment of anxiety.  Had nausea after starting sertraline.  Starting citalopram.  Continues to have the feeling of "something crawling" in the perirectal area.  He is highly concerned about infectious etiology.  Was seen by Colorectal Surgery at [facility] earlier this week.  Anoscopy with internal hemorrhoids, otherwise unremarkable.  No new medications prescribed.  Review of systems:     No chest pain, no SOB, no fevers, no urinary sx   Past Medical History:  Diagnosis Date  . Anxiety   . Chronic headaches   . Clavicular fracture   . History of chickenpox   . Mild intermittent asthma 06/07/2018  .  Shingles     24 years old.    Patient's surgical history, family medical history, social history, medications and allergies were all reviewed in Epic    Current Outpatient Medications  Medication Sig Dispense Refill  . azelastine (ASTELIN) 0.1 % nasal spray Place 2 sprays into both nostrils 2 (two) times daily. 30 mL 5  . citalopram (CELEXA) 20 MG tablet Take a half a tablet by mouth daily x2 weeks.  Then increase to 1 tablet daily 30 tablet 3  . EPINEPHrine (AUVI-Q) 0.3 mg/0.3 mL IJ SOAJ injection Inject 0.3 mLs (0.3 mg total) into the muscle as needed for anaphylaxis. 2 each 1  . fexofenadine (ALLEGRA) 180 MG tablet Take 180 mg by mouth daily.    . pantoprazole (PROTONIX) 40 MG tablet Take 1 tablet (40 mg total) by mouth daily. 30 tablet 3  . Rimegepant Sulfate (NURTEC PO) Take by mouth.    . VENTOLIN HFA 108 (90 Base) MCG/ACT inhaler Inhale 2 puffs into the lungs every 4 (four) hours as needed.     . Vitamin D, Ergocalciferol, (DRISDOL) 1.25 MG (50000 UNIT) CAPS capsule Take 50,000 Units by mouth once a week.     No current facility-administered medications for this visit.    Physical Exam:     Ht 6\' 6"  (1.981 m)   Wt (!) 336 lb 6 oz (152.6 kg)   BMI 38.87 kg/m   GENERAL:  Pleasant male in NAD PSYCH: : Cooperative, normal affect EENT:  conjunctiva pink, mucous membranes moist, neck supple without masses CARDIAC:  RRR, no murmur heard, no peripheral edema PULM: Normal respiratory effort, lungs CTA bilaterally, no wheezing ABDOMEN:  Nondistended, soft, nontender. No obvious masses, no hepatomegaly,  normal bowel sounds SKIN:  turgor, no lesions seen Musculoskeletal:  Normal muscle tone, normal strength NEURO: Alert and oriented x 3, no focal neurologic deficits   IMPRESSION and PLAN:    1) Dysphagia 2) Abnormal barium esophagram -Discussed esophagram findings at length today -EGD was otherwise unrevealing without stricture, luminal narrowing, ring, etc. -Further evaluation with  Esophageal Manometry.  This is already been ordered and scheduled.  Can follow-up to discuss results -In interim, continue to cut food into small pieces, chew thoroughly, drink plenty of fluids with meals  3) Pruritus ani 4) Internal hemorrhoids -Again, suspect symptoms are from internal hemorrhoids alone.  Since my last appointment, he sought follow-up with Colorectal Surgery at [facility], who seems to agree.  However, patient however concerned about alternate etiology, specifically infectious etiology.  We discussed that at length today, particularly parasitic infections.  He would very much like to try antimicrobial therapy.  Empiric trial course of albendazole -Continue fiber -Continue Desitin -Follow-up in the Colorectal Surgery clinic as previously planned. -Pending response to therapy, can also consider hemorrhoidal banding for small, grade 1 internal hemorrhoids  5) GERD: -Well-controlled since changing to Protonix 40 mg/day -Suboptimal response when changing from PPI to Pepcid monotherapy -Previously declined pH/impedance testing or Bravo -Continue antireflux lifestyle/dietary modifications  6) Generalized abdominal pain 7) IBS -Discussed generalized pain and IBS symptoms and underlying anxiety.  Has made recent medication changes, and will observe for clinical improvement  8) Nausea -Somewhat worse with changes to antianxiety regimen.  Observe for change as he gets used to his new medication (Celexa)  9) History of colon polyps -Colonoscopy in 2020 with 12 mm TA -Repeat colonoscopy 2023 for ongoing polyp surveillance per guidelines  RTC after Esophageal Manometry, or sooner as needed  I spent 35 minutes of time, including in depth chart review, independent review of results as outlined above, communicating results with the patient directly, face-to-face time with the patient, coordinating care, and ordering studies and medications as appropriate, and documentation.    Lavena Bullion ,DO, FACG 04/08/2020, 9:35 AM

## 2020-04-08 NOTE — Patient Instructions (Addendum)
If you are age 25 or older, your body mass index should be between 23-30. Your Body mass index is 38.87 kg/m. If this is out of the aforementioned range listed, please consider follow up with your Primary Care Provider.  If you are age 100 or younger, your body mass index should be between 19-25. Your Body mass index is 38.87 kg/m. If this is out of the aformentioned range listed, please consider follow up with your Primary Care Provider.   We have sent the following medications to your pharmacy for you to pick up at your convenience: Albendazole 400 mg   It was a pleasure to see you today!  Vito Cirigliano, D.O.

## 2020-04-09 ENCOUNTER — Ambulatory Visit (HOSPITAL_COMMUNITY)
Admission: EM | Admit: 2020-04-09 | Discharge: 2020-04-09 | Disposition: A | Discharge status: Home / Self Care | Payer: 59

## 2020-04-09 ENCOUNTER — Encounter (HOSPITAL_COMMUNITY): Payer: Self-pay

## 2020-04-09 ENCOUNTER — Other Ambulatory Visit: Payer: Self-pay

## 2020-04-09 DIAGNOSIS — L03116 Cellulitis of left lower limb: Secondary | ICD-10-CM

## 2020-04-09 MED ORDER — CEPHALEXIN 500 MG PO CAPS
500.0000 mg | ORAL_CAPSULE | Freq: Three times a day (TID) | ORAL | 0 refills | Status: AC
Start: 2020-04-09 — End: 2020-04-19

## 2020-04-09 NOTE — ED Triage Notes (Addendum)
Pt states he awoke yesterday morning with pain and burning sensation to left tibia area and "skin was missing" in a small area.  approx 31mm central area of epidermis absent with erythema in circular pattern approx 2.5 cm diameter which has not increased in size since pt first noticed the area.  Denies numbness to foot/toes, neurovascular intact.

## 2020-04-09 NOTE — ED Provider Notes (Signed)
[place]    CSN: [id number] Arrival date & time: 04/09/20  1226      History   Chief Complaint Chief Complaint  Patient presents with  . suspected spider bite    HPI Bradley [name] is a 24 y.o. male.   Bradley [name] presents with complaints of redness and burning sensation to left anterior shin. Woke yesterday with burning pain sensation and noted small puncture appearing wound with surrounding redness. Present today. No increase in redness. Non tender. Mild itching. No drainage. Denies any previous similar. Went to the dermatologist for an unrelated complaint, was provided keflex for this, but was referred here to be seen.    ROS per HPI, negative if not otherwise mentioned.      Past Medical History:  Diagnosis Date  . Anxiety   . Chronic headaches   . Clavicular fracture   . History of chickenpox   . Mild intermittent asthma 06/07/2018  . Shingles    24 years old.    Patient Active Problem List   Diagnosis Date Noted  . Mild persistent asthma, uncomplicated 73/[id number]/8768  . Seasonal allergic conjunctivitis 03/04/2020  . Low back pain 12/18/2019  . Seasonal and perennial allergic rhinitis 10/15/2019  . Shortness of breath 10/15/2019  . 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 Hamstring injury, right, initial encounter 01/23/2019  . Migraine without aura and without status migrainosus, not intractable 07/10/2018  . Morbid obesity with BMI of 40.0-44.9, adult ([place]) 07/10/2018  . Allergic rhinitis 06/07/2018  . Mild intermittent asthma 06/07/2018  . Atypical chest pain 12/21/2017  . Generalized abdominal pain 12/17/2017  . Anxiety 11/28/2017    Past Surgical History:  Procedure Laterality Date  . CYSTECTOMY  01/2019   scrotum area   . shoulder surgery Right 08/26/2019  . WISDOM TOOTH EXTRACTION         Home Medications    Prior to Admission medications   Medication Sig Start Date End Date Taking? Authorizing  Provider  citalopram (CELEXA) 20 MG tablet Take a half a tablet by mouth daily x2 weeks.  Then increase to 1 tablet daily 04/02/20  Yes Brunetta Jeans, PA-C  fexofenadine (ALLEGRA) 180 MG tablet Take 180 mg by mouth daily.   Yes [provider]  pantoprazole (PROTONIX) 40 MG tablet Take 1 tablet (40 mg total) by mouth daily. 04/05/20  Yes Brunetta Jeans, PA-C  VENTOLIN HFA 108 (90 Base) MCG/ACT inhaler Inhale 2 puffs into the lungs every 4 (four) hours as needed.  10/03/19  Yes [provider]  albendazole (ALBENZA) 200 MG tablet Take 2 tablets (400 mg total) by mouth as directed. Take 400 mg x 1, then take second dose 400 mg two weeks later 04/08/20   Cirigliano, Vito V, DO  azelastine (ASTELIN) 0.1 % nasal spray Place 2 sprays into both nostrils 2 (two) times daily. 03/04/20   Ambs, Kathrine Cords, FNP  busPIRone (BUSPAR) 10 MG tablet Take 10 mg by mouth 2 (two) times daily. 03/28/20   [provider]  cephALEXin (KEFLEX) 500 MG capsule Take 1 capsule (500 mg total) by mouth 3 (three) times daily for 10 days. 04/09/20 04/19/20  Zigmund Gottron, NP  EPINEPHrine (AUVI-Q) 0.3 mg/0.3 mL IJ SOAJ injection Inject 0.3 mLs (0.3 mg total) into the muscle as needed for anaphylaxis. 03/04/20   Dara Hoyer, FNP  Rimegepant Sulfate (NURTEC PO) Take by mouth.    [provider]  rizatriptan (MAXALT) 10  MG tablet Take by mouth. 03/26/20   [provider]  Vitamin D, Ergocalciferol, (DRISDOL) 1.25 MG (50000 UNIT) CAPS capsule Take 50,000 Units by mouth once a week. 03/22/20   [provider]    Family History Family History  Problem Relation Age of Onset  . Hypertension Mother   . Colon polyps Mother        might be mistakrn  . Diabetes Father        type II  . Migraines Sister   . Colon cancer Neg Hx   . Esophageal cancer Neg Hx   . Rectal cancer Neg Hx   . Stomach cancer Neg Hx     Social History Social History   Tobacco Use  . Smoking status: Never Smoker   . Smokeless tobacco: Never Used  Vaping Use  . Vaping Use: Never used  Substance Use Topics  . Alcohol use: Not Currently  . Drug use: Yes    Types: Marijuana    Comment: rare use     Allergies   Shellfish allergy and Hydrocodone   Review of Systems Review of Systems   Physical Exam Triage Vital Signs ED Triage Vitals  Enc Vitals Group     BP 04/09/20 1256 133/80     Pulse Rate 04/09/20 1256 69     Resp 04/09/20 1256 18     Temp 04/09/20 1256 98.5 F (36.9 C)     Temp Source 04/09/20 1256 Oral     SpO2 04/09/20 1256 98 %     Weight --      Height --      Head Circumference --      Peak Flow --      Pain Score 04/09/20 1349 5     Pain Loc --      Pain Edu? --      Excl. in [place]? --    No data found.  Updated Vital Signs BP 133/80 (BP Location: Left Arm)   Pulse 69   Temp 98.5 F (36.9 C) (Oral)   Resp 18   SpO2 98%   Visual Acuity Right Eye Distance:   Left Eye Distance:   Bilateral Distance:    Right Eye Near:   Left Eye Near:    Bilateral Near:     Physical Exam Constitutional:      Appearance: He is well-developed.  Cardiovascular:     Rate and Rhythm: Normal rate.  Pulmonary:     Effort: Pulmonary effort is normal.  Skin:    General: Skin is warm and dry.          Comments: approximately nickel sized round lesion to left anterior shin, minimal tenderness; central puncture/ scabbing noted; no active drainage; see photo; no fluctuance  Neurological:     Mental Status: He is alert and oriented to person, place, and time.        UC Treatments / Results  Labs (all labs ordered are listed, but only abnormal results are displayed) Labs Reviewed - No data to display  EKG   Radiology No results found.  Procedures Procedures (including critical care time)  Medications Ordered in UC Medications - No data to display  Initial Impression / Assessment and Plan / UC Course  I have reviewed the triage vital signs and the nursing  notes.  Pertinent labs & imaging results that were available during my care of the patient were reviewed by me and considered in my medical decision making (see chart for  details).     Will cover with antibiotics, patient would like these electronically sent, as they had been given to him by paper script earlier today. Concern for local cellulitis. Patient is concerned about brown recluse spider bite. Return precautions provided. Patient verbalized understanding and agreeable to plan.    Final Clinical Impressions(s) / UC Diagnoses   Final diagnoses:  Cellulitis of left lower extremity     Discharge Instructions     Complete course of antibiotics.  If symptoms worsen or do not improve in the next week to return to be seen or to follow up with your PCP.     ED Prescriptions    Medication Sig Dispense Auth. Provider   cephALEXin (KEFLEX) 500 MG capsule Take 1 capsule (500 mg total) by mouth 3 (three) times daily for 10 days. 30 capsule Zigmund Gottron, NP     PDMP not reviewed this encounter.   Zigmund Gottron, NP 04/09/20 1529

## 2020-04-09 NOTE — Discharge Instructions (Signed)
Complete course of antibiotics.  If symptoms worsen or do not improve in the next week to return to be seen or to follow up with your PCP.

## 2020-04-12 ENCOUNTER — Encounter: Payer: Self-pay | Admitting: Physician Assistant

## 2020-04-12 ENCOUNTER — Ambulatory Visit: Payer: 59 | Admitting: Nurse Practitioner

## 2020-04-14 DIAGNOSIS — K116 Mucocele of salivary gland: Secondary | ICD-10-CM | POA: Insufficient documentation

## 2020-04-15 ENCOUNTER — Ambulatory Visit: Payer: 59 | Admitting: Gastroenterology

## 2020-04-15 IMAGING — DX DG TIBIA/FIBULA 2V*R*
4 series · 4 of 4 positions shown · non-contrast
Comparison: Right knee radiograph dated 01/12/2019.

CLINICAL DATA: 23-year-old male with right lower extremity pain.

EXAM:
RIGHT TIBIA AND FIBULA - 2 VIEW

[tibia ap (1 of 2)]
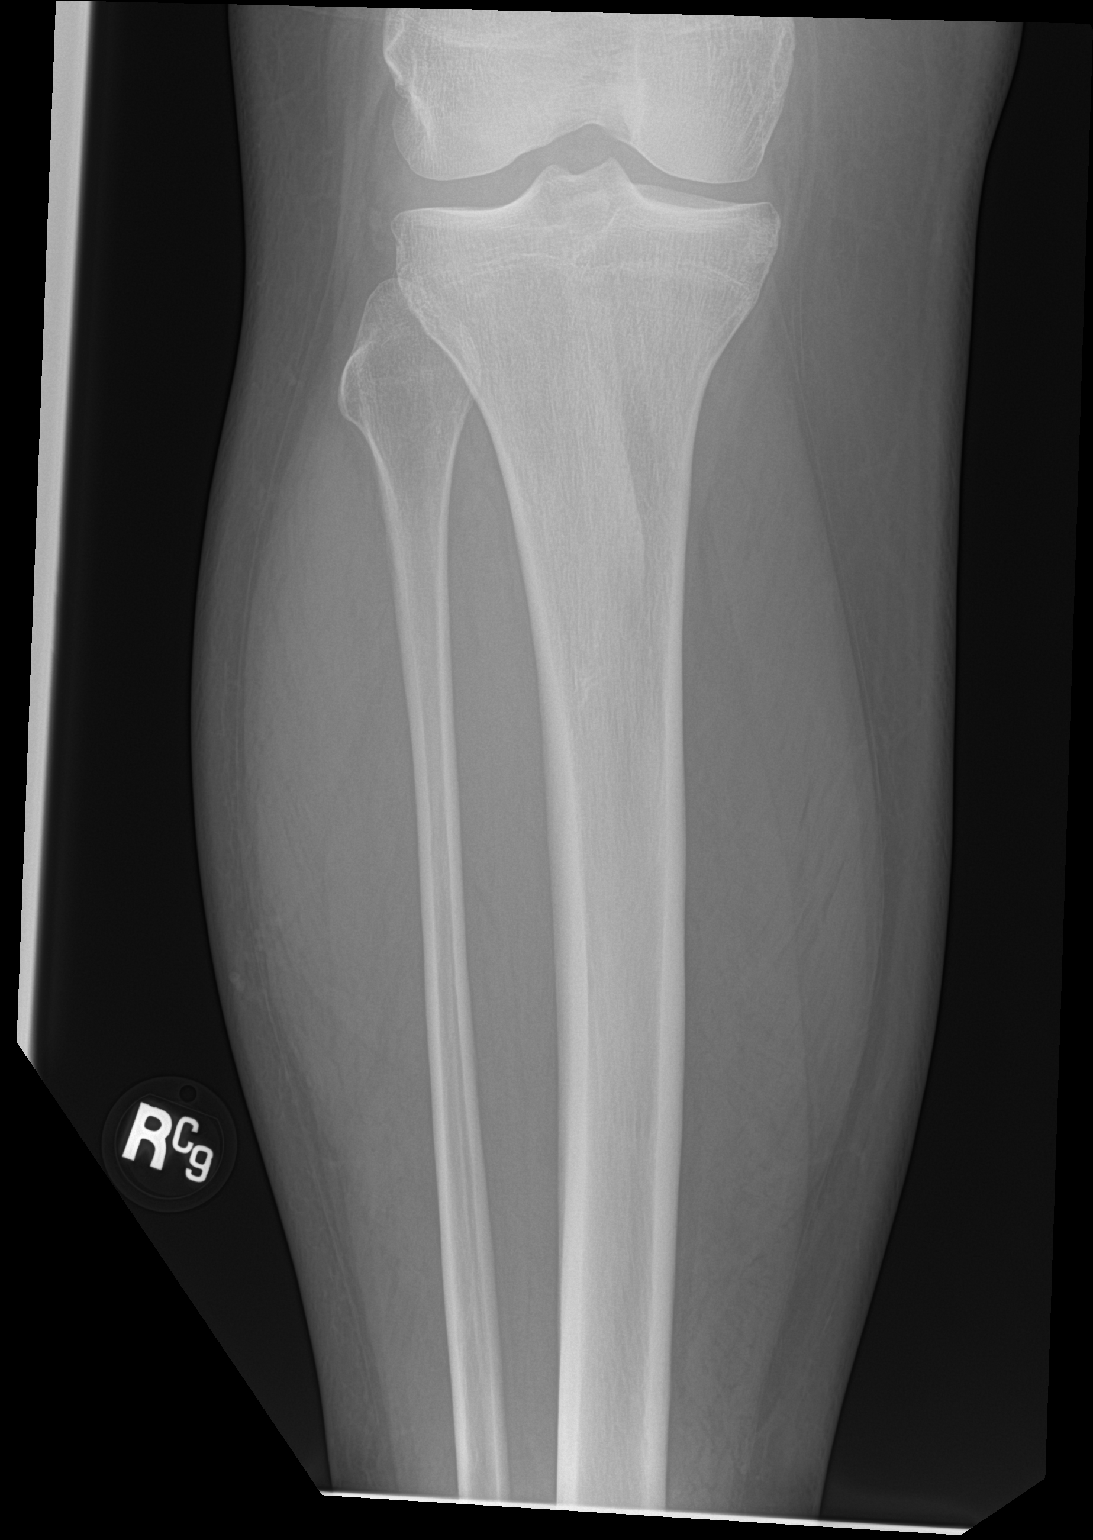

[tibia ap (2 of 2)]
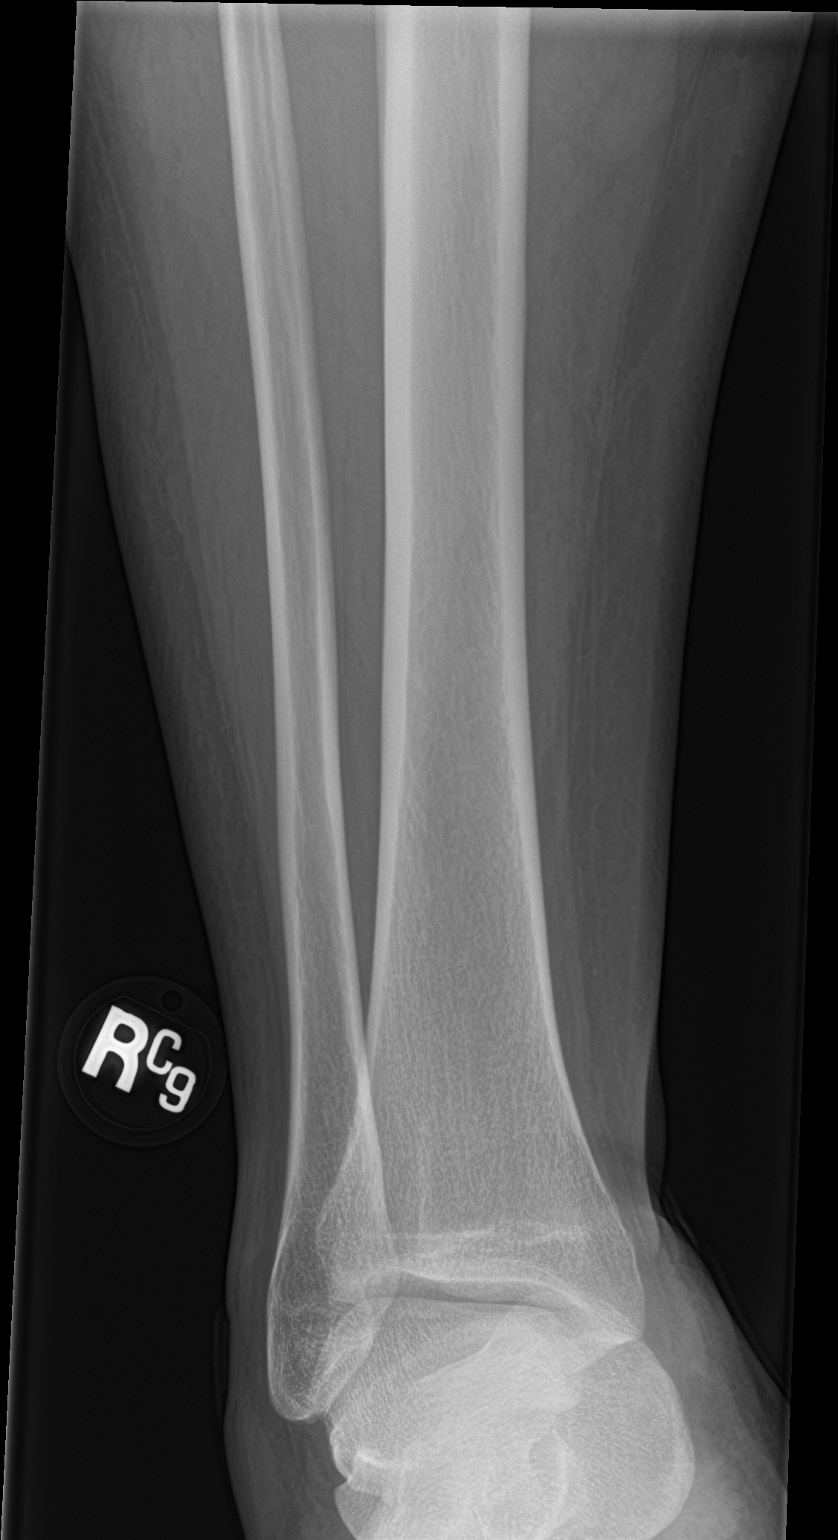

[tibia lat (1 of 2)]
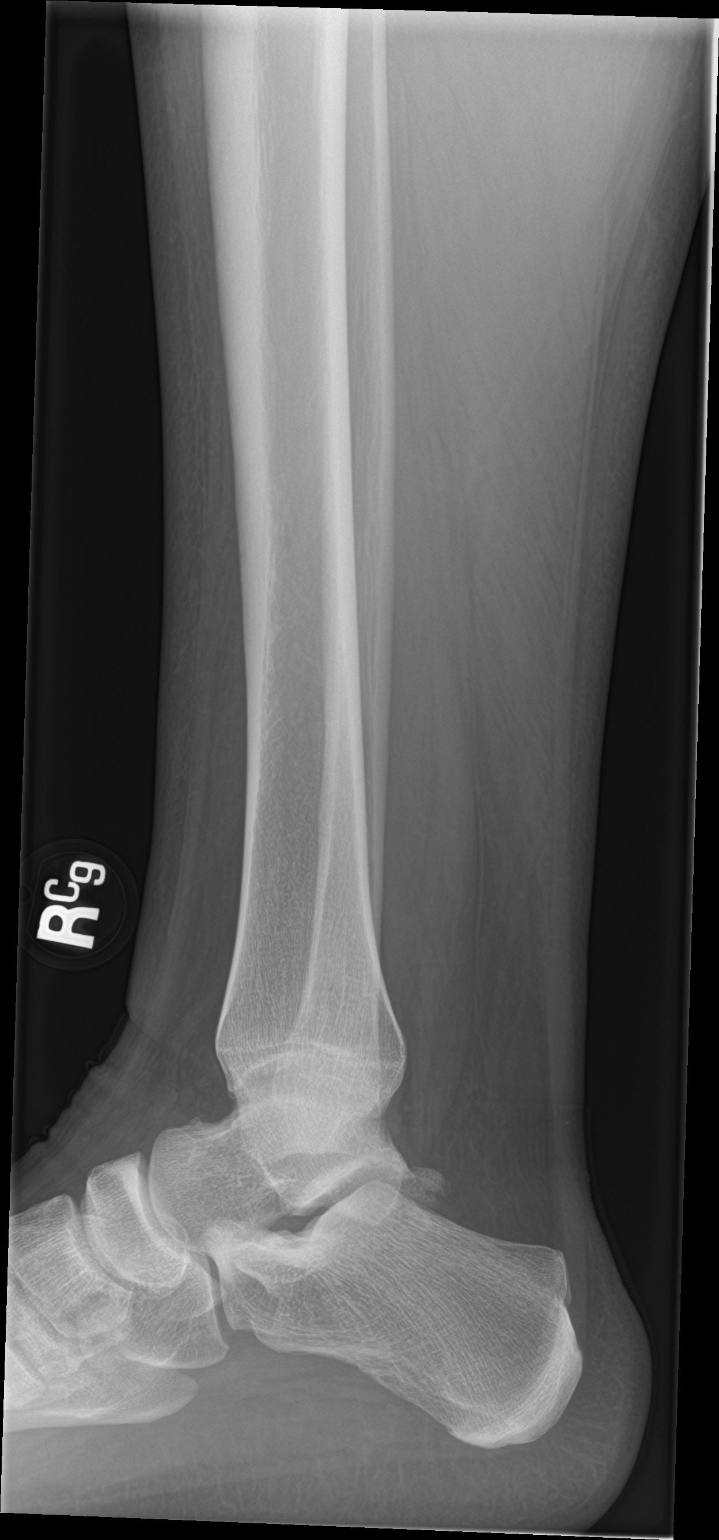

[tibia lat (2 of 2)]
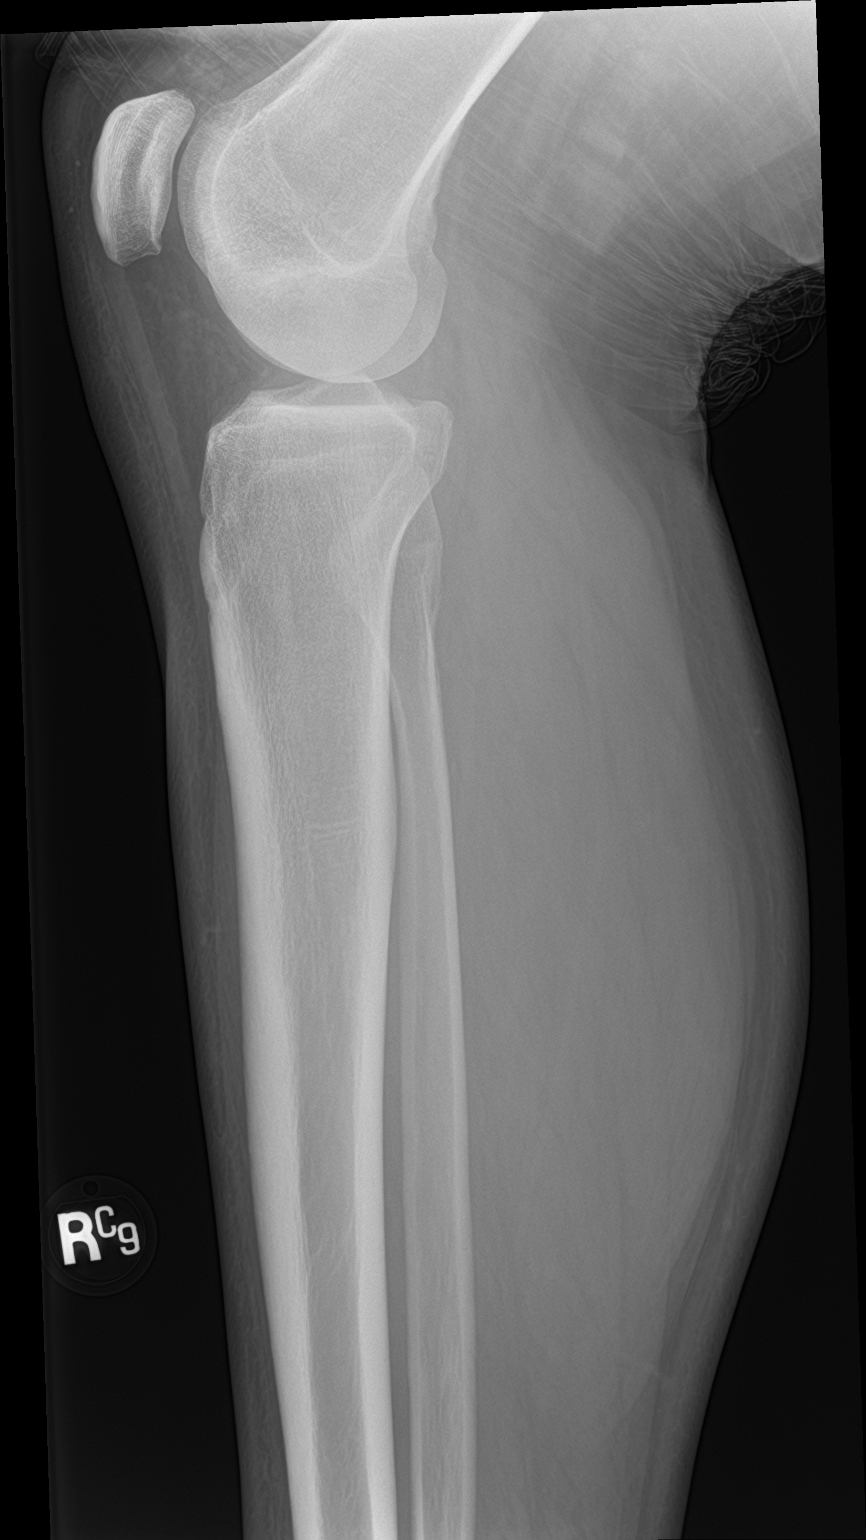

[4 of 4 positions shown; findings below may reference images not displayed]

FINDINGS: There is no evidence of fracture or other focal bone lesions. Soft
tissues are unremarkable.
IMPRESSION: Negative.

## 2020-04-16 ENCOUNTER — Encounter: Payer: Self-pay | Admitting: Physician Assistant

## 2020-04-19 DIAGNOSIS — H1010 Acute atopic conjunctivitis, unspecified eye: Secondary | ICD-10-CM | POA: Insufficient documentation

## 2020-04-19 NOTE — Progress Notes (Signed)
Follow Up Note  RE: Bradley [name] MRN: [id number] DOB: March 13, 1996 Date of Office Visit: 04/20/2020  Referring provider: Brunetta Jeans, PA-C Primary care provider: Brunetta Jeans, PA-C  Chief Complaint: Follow-up, Allergic Rhinitis , and Insect Bite  History of Present Illness: I had the pleasure of seeing Bradley [name] for a follow up visit at the [facility] on 04/20/2020. He is a 24 y.o. male, who is being followed for allergic rhino conjunctivitis, asthma, food allergy. His previous allergy office visit was on 03/04/2020 with Gareth Morgan, [place]. Today is a regular follow up visit.  Patient got bit by a brown recluse about 2 weeks ago on his leg. Treated with antibiotics but having some tingling on the left foot now.  A few days ago had another insect bite on his left torso.   Seasonal and perennial allergic rhinitis Patient tried to stop allegra but he notices body pruritus if he stops taking it.  The allegra and Xyzal causes dry eyes. Has dust mite covers for the pillows.  Used to be on Singulair and not sure if it helped.  Using azelastine as needed only with good benefit but having drainage worse in the mornings.  Wants to restart allergy injections.   Anaphylactic shock due to adverse food reaction Avoiding shellfish and no reactions.  Shortness of breath Denies any SOB, coughing, wheezing, chest tightness, nocturnal awakenings, ER/urgent care visits or prednisone use since the last visit.  Assessment and Plan: Bradley [name] is a 24 y.o. male with: Insect bite of thoracic wall Insect bite under the armpits. Physical exam not concerning for infection. No indication for antibiotics or prednisone at this time.   Monitor your rash - if you notice that it's getting larger, warm to the touch or oozing let us know.  May use OTC topical hydrocortisone or benadryl cream as needed twice a day.  If you notice that the tingling sensation on the foot is not  improving please follow up with your PCP regarding that.   Seasonal and perennial allergic rhinoconjunctivitis Past history -2019 blood work was positive to dust mites, cat, dog, grass, tree, borderline to ragweed, cockroach. Started AIT on 02/14/2018 (Mite-Cockroach-Ragweed & Pollen-Pet).  Interim history - missed injections due to personal events and would like to restart. Noted that allegra and Xyzal causes dry eyes but unable to stop allegra due to whole body pruritus.   Continue allergen avoidance measures.  Get dust mites covers for the bedding.    You can try 1/2 tablet of Allegra OR loratadine (Claritin) once a day and monitor symptoms.   Use azelastine nasal spray 1-2 sprays per nostril twice a day as needed for runny nose/drainage.  Nasal saline spray (i.e., Simply Saline) or nasal saline lavage (i.e., NeilMed) is recommended as needed and prior to medicated nasal sprays.  Restart allergy injections.   If this does not work, then will start Singulair next.   Anaphylactic shock due to adverse food reaction Past history - 2020 bloodwork positive to shellfish.  Continue strict avoidance of shellfish.  For mild symptoms you can take over the counter antihistamines such as Benadryl and monitor symptoms closely. If symptoms worsen or if you have severe symptoms including breathing issues, throat closure, significant swelling, whole body hives, severe diarrhea and vomiting, lightheadedness then inject epinephrine and seek immediate medical care afterwards.  Food action plan in place.   Shortness of breath Past history - 2019 spirometry normal.  Interim history - no  issues.   May use albuterol rescue inhaler 2 puffs every 4 to 6 hours as needed for shortness of breath, chest tightness, coughing, and wheezing. May use albuterol rescue inhaler 2 puffs 5 to 15 minutes prior to strenuous physical activities. Monitor frequency of use.   Return in about 6 months (around  10/21/2020).  Diagnostics: None.  Medication List:  Current Outpatient Medications  Medication Sig Dispense Refill  . albendazole (ALBENZA) 200 MG tablet Take 2 tablets (400 mg total) by mouth as directed. Take 400 mg x 1, then take second dose 400 mg two weeks later 4 tablet 0  . azelastine (ASTELIN) 0.1 % nasal spray Place 2 sprays into both nostrils 2 (two) times daily. 30 mL 5  . busPIRone (BUSPAR) 10 MG tablet Take 10 mg by mouth 2 (two) times daily.    . citalopram (CELEXA) 20 MG tablet Take a half a tablet by mouth daily x2 weeks.  Then increase to 1 tablet daily 30 tablet 3  . EPINEPHrine (AUVI-Q) 0.3 mg/0.3 mL IJ SOAJ injection Inject 0.3 mLs (0.3 mg total) into the muscle as needed for anaphylaxis. 2 each 1  . fexofenadine (ALLEGRA) 180 MG tablet Take 180 mg by mouth daily.    . pantoprazole (PROTONIX) 40 MG tablet Take 1 tablet (40 mg total) by mouth daily. 30 tablet 3  . Rimegepant Sulfate (NURTEC PO) Take by mouth.    . rizatriptan (MAXALT) 10 MG tablet Take by mouth.    . VENTOLIN HFA 108 (90 Base) MCG/ACT inhaler Inhale 2 puffs into the lungs every 4 (four) hours as needed.     . Vitamin D, Ergocalciferol, (DRISDOL) 1.25 MG (50000 UNIT) CAPS capsule Take 50,000 Units by mouth once a week.     No current facility-administered medications for this visit.   Allergies: Allergies  Allergen Reactions  . Shellfish Allergy Anaphylaxis  . Hydrocodone Itching    Pt states he feels itchy after taking, but it doesn't preclude him taking hydrocodone if needed   I reviewed his past medical history, social history, family history, and environmental history and no significant changes have been reported from his previous visit.  Review of Systems  Constitutional: Negative for appetite change, chills, fever and unexpected weight change.  HENT: Positive for congestion, postnasal drip, rhinorrhea and sinus pressure. Negative for sneezing.   Eyes: Negative for itching.  Respiratory:  Negative for cough, chest tightness, shortness of breath and wheezing.   Gastrointestinal: Negative for abdominal pain.  Skin: Positive for rash.  Allergic/Immunologic: Positive for environmental allergies and food allergies.  Neurological: Negative for headaches.   Objective: BP 122/78   Pulse 71   Temp 98 F (36.7 C) (Temporal)   Resp 18   Wt (!) 336 lb 6.4 oz (152.6 kg)   SpO2 98%   BMI 38.87 kg/m  Body mass index is 38.87 kg/m. Physical Exam Vitals and nursing note reviewed.  Constitutional:      Appearance: Normal appearance. He is well-developed.  HENT:     Head: Normocephalic and atraumatic.     Right Ear: Tympanic membrane and external ear normal.     Left Ear: Tympanic membrane and external ear normal.     Nose: Nose normal.     Mouth/Throat:     Mouth: Mucous membranes are moist.     Pharynx: Oropharynx is clear.  Eyes:     Conjunctiva/sclera: Conjunctivae normal.  Cardiovascular:     Rate and Rhythm: Normal rate and regular rhythm.  Heart sounds: Normal heart sounds. No murmur heard.   Pulmonary:     Effort: Pulmonary effort is normal.     Breath sounds: Normal breath sounds. No wheezing, rhonchi or rales.  Musculoskeletal:     Cervical back: Neck supple.  Skin:    General: Skin is warm.     Findings: Rash present.     Comments: Flat erythematous patch on left midaxillary line. No pain with palpation.   Neurological:     Mental Status: He is alert and oriented to person, place, and time.  Psychiatric:        Behavior: Behavior normal.    Previous notes and tests were reviewed. The plan was reviewed with the patient/family, and all questions/concerned were addressed.  It was my pleasure to see Bradley [name] today and participate in his care. Please feel free to contact me with any questions or concerns.  Sincerely,  Rexene Alberts, DO Allergy & Immunology  [facility] office: [phone] [facility] office:  [place] office: [phone]

## 2020-04-20 ENCOUNTER — Other Ambulatory Visit: Payer: Self-pay

## 2020-04-20 ENCOUNTER — Encounter: Payer: Self-pay | Admitting: Allergy

## 2020-04-20 ENCOUNTER — Encounter: Payer: Self-pay | Admitting: Family Medicine

## 2020-04-20 ENCOUNTER — Encounter: Payer: Self-pay | Admitting: Physician Assistant

## 2020-04-20 ENCOUNTER — Ambulatory Visit (INDEPENDENT_AMBULATORY_CARE_PROVIDER_SITE_OTHER): Payer: 59 | Admitting: Family Medicine

## 2020-04-20 ENCOUNTER — Ambulatory Visit (INDEPENDENT_AMBULATORY_CARE_PROVIDER_SITE_OTHER): Payer: 59 | Admitting: Allergy

## 2020-04-20 VITALS — BP 122/78 | HR 71 | Temp 98.0°F | Resp 18 | Wt 336.4 lb

## 2020-04-20 VITALS — BP 118/82 | HR 77 | Temp 98.5°F | Ht 78.0 in | Wt 337.0 lb

## 2020-04-20 DIAGNOSIS — W57XXXA Bitten or stung by nonvenomous insect and other nonvenomous arthropods, initial encounter: Secondary | ICD-10-CM | POA: Insufficient documentation

## 2020-04-20 DIAGNOSIS — S2096XA Insect bite (nonvenomous) of unspecified parts of thorax, initial encounter: Secondary | ICD-10-CM | POA: Insufficient documentation

## 2020-04-20 DIAGNOSIS — S2096XD Insect bite (nonvenomous) of unspecified parts of thorax, subsequent encounter: Secondary | ICD-10-CM | POA: Diagnosis not present

## 2020-04-20 DIAGNOSIS — R0602 Shortness of breath: Secondary | ICD-10-CM

## 2020-04-20 DIAGNOSIS — J302 Other seasonal allergic rhinitis: Secondary | ICD-10-CM

## 2020-04-20 DIAGNOSIS — J4520 Mild intermittent asthma, uncomplicated: Secondary | ICD-10-CM

## 2020-04-20 DIAGNOSIS — T7800XD Anaphylactic reaction due to unspecified food, subsequent encounter: Secondary | ICD-10-CM

## 2020-04-20 DIAGNOSIS — J3089 Other allergic rhinitis: Secondary | ICD-10-CM

## 2020-04-20 DIAGNOSIS — W57XXXD Bitten or stung by nonvenomous insect and other nonvenomous arthropods, subsequent encounter: Secondary | ICD-10-CM

## 2020-04-20 DIAGNOSIS — H1010 Acute atopic conjunctivitis, unspecified eye: Secondary | ICD-10-CM

## 2020-04-20 NOTE — Assessment & Plan Note (Signed)
Past history - 2019 spirometry normal.  Interim history - no issues.   May use albuterol rescue inhaler 2 puffs every 4 to 6 hours as needed for shortness of breath, chest tightness, coughing, and wheezing. May use albuterol rescue inhaler 2 puffs 5 to 15 minutes prior to strenuous physical activities. Monitor frequency of use.

## 2020-04-20 NOTE — Progress Notes (Signed)
Established Patient Office Visit  Subjective:  Patient ID: Bradley [name], male    DOB: [birth year]  Age: 24 y.o. MRN: [id number]  CC:  Chief Complaint  Patient presents with  . Spider bite under arm    quarter-sized raised area with rash around it, under left arm    HPI Bradley [name] presents for concern for possible "spider bite "left axillary region.  He first noticed some mild redness couple days ago.  This has been pruritic but not painful.  He does not recall any pustule near the center.  No recent tick bites.  No fevers or chills.  Pruritus has been relatively mild.  Past Medical History:  Diagnosis Date  . Anxiety   . Chronic headaches   . Clavicular fracture   . History of chickenpox   . Mild intermittent asthma 06/07/2018  . Shingles    24 years old.    Past Surgical History:  Procedure Laterality Date  . CYSTECTOMY  01/2019   scrotum area   . shoulder surgery Right 08/26/2019  . WISDOM TOOTH EXTRACTION      Family History  Problem Relation Age of Onset  . Hypertension Mother   . Colon polyps Mother        might be mistakrn  . Diabetes Father        type II  . Migraines Sister   . Colon cancer Neg Hx   . Esophageal cancer Neg Hx   . Rectal cancer Neg Hx   . Stomach cancer Neg Hx     Social History   Socioeconomic History  . Marital status: Single    Spouse name: Not on file  . Number of children: 0  . Years of education: College  . Highest education level: Not on file  Occupational History  . Occupation: Sales executive  Tobacco Use  . Smoking status: Never Smoker  . Smokeless tobacco: Never Used  Vaping Use  . Vaping Use: Never used  Substance and Sexual Activity  . Alcohol use: Not Currently  . Drug use: Yes    Types: Marijuana    Comment: rare use  . Sexual activity: Yes    Partners: Female  Other Topics Concern  . Not on file  Social History Narrative   Lives alone   Caffeine use: 1 cup coffee per day   Right handed    Social  Determinants of Health   Financial Resource Strain:   . Difficulty of Paying Living Expenses:   Food Insecurity:   . Worried About Charity fundraiser in the Last Year:   . Arboriculturist in the Last Year:   Transportation Needs:   . Film/video editor (Medical):   Marland Kitchen Lack of Transportation (Non-Medical):   Physical Activity:   . Days of Exercise per Week:   . Minutes of Exercise per Session:   Stress:   . Feeling of Stress :   Social Connections:   . Frequency of Communication with Friends and Family:   . Frequency of Social Gatherings with Friends and Family:   . Attends Religious Services:   . Active Member of Clubs or Organizations:   . Attends Archivist Meetings:   Marland Kitchen Marital Status:   Intimate Partner Violence:   . Fear of Current or Ex-Partner:   . Emotionally Abused:   Marland Kitchen Physically Abused:   . Sexually Abused:     Outpatient Medications Prior to Visit  Medication Sig Dispense Refill  .  albendazole (ALBENZA) 200 MG tablet Take 2 tablets (400 mg total) by mouth as directed. Take 400 mg x 1, then take second dose 400 mg two weeks later 4 tablet 0  . azelastine (ASTELIN) 0.1 % nasal spray Place 2 sprays into both nostrils 2 (two) times daily. 30 mL 5  . citalopram (CELEXA) 20 MG tablet Take by mouth.    . EPINEPHrine (AUVI-Q) 0.3 mg/0.3 mL IJ SOAJ injection Inject 0.3 mLs (0.3 mg total) into the muscle as needed for anaphylaxis. 2 each 1  . fexofenadine (ALLEGRA) 180 MG tablet Take 180 mg by mouth daily.    . pantoprazole (PROTONIX) 40 MG tablet Take 1 tablet (40 mg total) by mouth daily. 30 tablet 3  . Rimegepant Sulfate (NURTEC PO) Take by mouth.    . triamcinolone cream (KENALOG) 0.1 % SMARTSIG:Topical Twice a Week PRN    . VENTOLIN HFA 108 (90 Base) MCG/ACT inhaler Inhale 2 puffs into the lungs every 4 (four) hours as needed.     . Vitamin D, Ergocalciferol, (DRISDOL) 1.25 MG (50000 UNIT) CAPS capsule Take 50,000 Units by mouth once a week.    .  busPIRone (BUSPAR) 10 MG tablet Take 10 mg by mouth 2 (two) times daily. (Patient not taking: Reported on 04/20/2020)    . citalopram (CELEXA) 20 MG tablet Take a half a tablet by mouth daily x2 weeks.  Then increase to 1 tablet daily 30 tablet 3  . rizatriptan (MAXALT) 10 MG tablet Take by mouth.     No facility-administered medications prior to visit.    Allergies  Allergen Reactions  . Shellfish Allergy Anaphylaxis  . Hydrocodone Itching    Pt states he feels itchy after taking, but it doesn't preclude him taking hydrocodone if needed About 2-3 years ago.  It was the first time he took hydrocodone.  . Other Diarrhea    Not anaphylaxis, but GI sensitivity.    ROS Review of Systems  Constitutional: Negative for chills and fever.  Hematological: Negative for adenopathy.      Objective:    Physical Exam Vitals reviewed.  Constitutional:      Appearance: Normal appearance.  Cardiovascular:     Rate and Rhythm: Normal rate and regular rhythm.  Skin:    Comments: Just below the left axillary region he has a area approximately 1-1/2 by 1- 1/2 cm of very mild erythema.  No warmth.  Nontender.  No pustules.  No fluctuance.  Appears to have a slightly punctate area near the center possibly site of bite of some sort.  No skin necrosis.  Neurological:     Mental Status: He is alert.     BP 118/82 (BP Location: Left Arm, Patient Position: Sitting, Cuff Size: Large)   Pulse 77   Temp 98.5 F (36.9 C) (Oral)   Ht 6\' 6"  (1.981 m)   Wt (!) 337 lb (152.9 kg)   SpO2 98%   BMI 38.94 kg/m  Wt Readings from Last 3 Encounters:  04/20/20 (!) 337 lb (152.9 kg)  04/20/20 (!) 336 lb 6.4 oz (152.6 kg)  04/08/20 (!) 336 lb 6 oz (152.6 kg)     Health Maintenance Due  Topic Date Due  . Hepatitis C Screening  Never done    There are no preventive care reminders to display for this patient.  Lab Results  Component Value Date   TSH 2.45 10/01/2018   Lab Results  Component Value Date    WBC 8.1 01/21/2020   HGB  14.8 01/21/2020   HCT 43.6 01/21/2020   MCV 90.0 01/21/2020   PLT 195.0 01/21/2020   Lab Results  Component Value Date   NA 137 03/11/2020   K 4.0 03/11/2020   CO2 24 03/11/2020   GLUCOSE 82 03/11/2020   BUN 11 03/11/2020   CREATININE 0.73 03/11/2020   BILITOT 0.6 03/11/2020   ALKPHOS 59 01/21/2020   AST 13 03/11/2020   ALT 14 03/11/2020   PROT 7.3 03/11/2020   ALBUMIN 4.7 01/21/2020   CALCIUM 9.4 03/11/2020   ANIONGAP 11 10/16/2019   GFR 133.48 01/21/2020   Lab Results  Component Value Date   CHOL 132 01/21/2020   Lab Results  Component Value Date   HDL 34.00 (L) 01/21/2020   Lab Results  Component Value Date   LDLCALC 82 01/21/2020   Lab Results  Component Value Date   TRIG 79.0 01/21/2020   Lab Results  Component Value Date   CHOLHDL 4 01/21/2020   Lab Results  Component Value Date   HGBA1C 5.0 01/21/2020      Assessment & Plan:   Possible insect bite left axillary region.  No signs of cellulitis or abscess.  No evidence for skin necrosis.  Suspect mild localized allergic reaction.  -Consider over-the-counter antihistamine as needed for any itching  No orders of the defined types were placed in this encounter.   Follow-up: No follow-ups on file.    Carolann Littler, MD

## 2020-04-20 NOTE — Assessment & Plan Note (Signed)
Insect bite under the armpits. Physical exam not concerning for infection. No indication for antibiotics or prednisone at this time.   Monitor your rash - if you notice that it's getting larger, warm to the touch or oozing let us know.  May use OTC topical hydrocortisone or benadryl cream as needed twice a day.  If you notice that the tingling sensation on the foot is not improving please follow up with your PCP regarding that.

## 2020-04-20 NOTE — Patient Instructions (Addendum)
Insect bite  Monitor your rash - if you notice that it's getting larger, warm to the touch or oozing let us know.  May use OTC topical hydrocortisone or benadryl cream as needed twice a day.  If you notice that the tingling sensation on the foot is not improving please follow up with your PCP regarding that.   Allergic rhinitis  Continue allergen avoidance measures to dust mites, cat, dog, grass pollen, weed pollen, tree pollen, and cockroach.  Get dust mites covers for the bedding.    You can try 1/2 tablet of Allegra OR loratadine (Claritin) once a day and monitor symptoms.   Use azelastine nasal spray 1-2 sprays per nostril twice a day as needed for runny nose/drainage.  Nasal saline spray (i.e., Simply Saline) or nasal saline lavage (i.e., NeilMed) is recommended as needed and prior to medicated nasal sprays.  Restart allergy injections.   If this does not work, then will start Singulair next.   Food allergy:  Continue strict avoidance of shellfish.  For mild symptoms you can take over the counter antihistamines such as Benadryl and monitor symptoms closely. If symptoms worsen or if you have severe symptoms including breathing issues, throat closure, significant swelling, whole body hives, severe diarrhea and vomiting, lightheadedness then inject epinephrine and seek immediate medical care afterwards.  Food action plan in place.   Breathing:  May use albuterol rescue inhaler 2 puffs every 4 to 6 hours as needed for shortness of breath, chest tightness, coughing, and wheezing. May use albuterol rescue inhaler 2 puffs 5 to 15 minutes prior to strenuous physical activities. Monitor frequency of use.   Follow up in 6 months or sooner if needed.   Control of House Dust Mite Allergen . Dust mite allergens are a common trigger of allergy and asthma symptoms. While they can be found throughout the house, these microscopic creatures thrive in warm, humid environments such as  bedding, upholstered furniture and carpeting. . Because so much time is spent in the bedroom, it is essential to reduce mite levels there.  . Encase pillows, mattresses, and box springs in special allergen-proof fabric covers or airtight, zippered plastic covers.  . Bedding should be washed weekly in hot water (130 F) and dried in a hot dryer. Allergen-proof covers are available for comforters and pillows that can't be regularly washed.  Wendee Copp the allergy-proof covers every few months. Minimize clutter in the bedroom. Keep pets out of the bedroom.  Marland Kitchen Keep humidity less than 50% by using a dehumidifier or air conditioning. You can buy a humidity measuring device called a hygrometer to monitor this.  . If possible, replace carpets with hardwood, linoleum, or washable area rugs. If that's not possible, vacuum frequently with a vacuum that has a HEPA filter. . Remove all upholstered furniture and non-washable window drapes from the bedroom. . Remove all non-washable stuffed toys from the bedroom.  Wash stuffed toys weekly.  Reducing Pollen Exposure . Pollen seasons: trees (spring), grass (summer) and ragweed/weeds (fall). Marland Kitchen Keep windows closed in your home and car to lower pollen exposure.  Susa Simmonds air conditioning in the bedroom and throughout the house if possible.  . Avoid going out in dry windy days - especially early morning. . Pollen counts are highest between 5 - 10 AM and on dry, hot and windy days.  . Save outside activities for late afternoon or after a heavy rain, when pollen levels are lower.  . Avoid mowing of grass if you have  grass pollen allergy. Marland Kitchen Be aware that pollen can also be transported indoors on people and pets.  . Dry your clothes in an automatic dryer rather than hanging them outside where they might collect pollen.  . Rinse hair and eyes before bedtime.  Pet Allergen Avoidance: . Contrary to popular opinion, there are no "hypoallergenic" breeds of dogs or cats. That  is because people are not allergic to an animal's hair, but to an allergen found in the animal's saliva, dander (dead skin flakes) or urine. Pet allergy symptoms typically occur within minutes. For some people, symptoms can build up and become most severe 8 to 12 hours after contact with the animal. People with severe allergies can experience reactions in public places if dander has been transported on the pet owners' clothing. Marland Kitchen Keeping an animal outdoors is only a partial solution, since homes with pets in the yard still have higher concentrations of animal allergens. . Before getting a pet, ask your allergist to determine if you are allergic to animals. If your pet is already considered part of your family, try to minimize contact and keep the pet out of the bedroom and other rooms where you spend a great deal of time. . As with dust mites, vacuum carpets often or replace carpet with a hardwood floor, tile or linoleum. . High-efficiency particulate air (HEPA) cleaners can reduce allergen levels over time. . While dander and saliva are the source of cat and dog allergens, urine is the source of allergens from rabbits, hamsters, mice and [place] pigs; so ask a non-allergic family member to clean the animal's cage. . If you have a pet allergy, talk to your allergist about the potential for allergy immunotherapy (allergy shots). This strategy can often provide long-term relief.  Cockroach Allergen Avoidance Cockroaches are often found in the homes of densely populated urban areas, schools or commercial buildings, but these creatures can lurk almost anywhere. This does not mean that you have a dirty house or living area. . Block all areas where roaches can enter the home. This includes crevices, wall cracks and windows.  . Cockroaches need water to survive, so fix and seal all leaky faucets and pipes. Have an exterminator go through the house when your family and pets are gone to eliminate any remaining  roaches. Marland Kitchen Keep food in lidded containers and put pet food dishes away after your pets are done eating. Vacuum and sweep the floor after meals, and take out garbage and recyclables. Use lidded garbage containers in the kitchen. Wash dishes immediately after use and clean under stoves, refrigerators or toasters where crumbs can accumulate. Wipe off the stove and other kitchen surfaces and cupboards regularly.

## 2020-04-20 NOTE — Assessment & Plan Note (Signed)
Past history - 2020 bloodwork positive to shellfish.  Continue strict avoidance of shellfish.  For mild symptoms you can take over the counter antihistamines such as Benadryl and monitor symptoms closely. If symptoms worsen or if you have severe symptoms including breathing issues, throat closure, significant swelling, whole body hives, severe diarrhea and vomiting, lightheadedness then inject epinephrine and seek immediate medical care afterwards.  Food action plan in place.  

## 2020-04-20 NOTE — Patient Instructions (Signed)
Follow up for any fever or increased redness or swelling.

## 2020-04-20 NOTE — Assessment & Plan Note (Signed)
Past history -2019 blood work was positive to dust mites, cat, dog, grass, tree, borderline to ragweed, cockroach. Started AIT on 02/14/2018 (Mite-Cockroach-Ragweed & Pollen-Pet).  Interim history - missed injections due to personal events and would like to restart. Noted that allegra and Xyzal causes dry eyes but unable to stop allegra due to whole body pruritus.   Continue allergen avoidance measures.  Get dust mites covers for the bedding.    You can try 1/2 tablet of Allegra OR loratadine (Claritin) once a day and monitor symptoms.   Use azelastine nasal spray 1-2 sprays per nostril twice a day as needed for runny nose/drainage.  Nasal saline spray (i.e., Simply Saline) or nasal saline lavage (i.e., NeilMed) is recommended as needed and prior to medicated nasal sprays.  Restart allergy injections.   If this does not work, then will start Singulair next.

## 2020-04-21 ENCOUNTER — Ambulatory Visit: Payer: 59 | Admitting: Family Medicine

## 2020-04-23 ENCOUNTER — Encounter: Payer: Self-pay | Admitting: Physician Assistant

## 2020-04-23 ENCOUNTER — Encounter: Payer: Self-pay | Admitting: Allergy

## 2020-04-23 ENCOUNTER — Ambulatory Visit (INDEPENDENT_AMBULATORY_CARE_PROVIDER_SITE_OTHER): Payer: 59

## 2020-04-23 DIAGNOSIS — J309 Allergic rhinitis, unspecified: Secondary | ICD-10-CM | POA: Diagnosis not present

## 2020-04-26 ENCOUNTER — Ambulatory Visit (INDEPENDENT_AMBULATORY_CARE_PROVIDER_SITE_OTHER): Payer: 59 | Admitting: Physician Assistant

## 2020-04-26 ENCOUNTER — Other Ambulatory Visit: Payer: Self-pay

## 2020-04-26 ENCOUNTER — Encounter: Payer: Self-pay | Admitting: Physician Assistant

## 2020-04-26 VITALS — BP 120/80 | HR 60 | Temp 98.6°F | Resp 16 | Ht 78.0 in | Wt 337.0 lb

## 2020-04-26 DIAGNOSIS — L247 Irritant contact dermatitis due to plants, except food: Secondary | ICD-10-CM | POA: Diagnosis not present

## 2020-04-26 DIAGNOSIS — J3089 Other allergic rhinitis: Secondary | ICD-10-CM | POA: Diagnosis not present

## 2020-04-26 DIAGNOSIS — J301 Allergic rhinitis due to pollen: Secondary | ICD-10-CM | POA: Diagnosis not present

## 2020-04-26 MED ORDER — METHYLPREDNISOLONE ACETATE 80 MG/ML IJ SUSP
80.0000 mg | Freq: Once | INTRAMUSCULAR | Status: AC
Start: 2020-04-26 — End: 2020-04-26
  Administered 2020-04-26: 80 mg via INTRAMUSCULAR

## 2020-04-26 NOTE — Progress Notes (Signed)
Patient presents to clinic today c/o pruritic rash of extremities, starting on his left hand and spreading from there.  Has also noted some areas on his upper posterior neck.  Denies any pain from lesion.  Did note rash started after being outside but is not sure of any known exposure to rhus plant.  Denies exposure to pets.  Denies contact with anyone with similar symptoms.  Patient denies fever, chills, malaise or fatigue.  Past Medical History:  Diagnosis Date  . Anxiety   . Chronic headaches   . Clavicular fracture   . History of chickenpox   . Mild intermittent asthma 06/07/2018  . Shingles    24 years old.    Current Outpatient Medications on File Prior to Visit  Medication Sig Dispense Refill  . albendazole (ALBENZA) 200 MG tablet Take 2 tablets (400 mg total) by mouth as directed. Take 400 mg x 1, then take second dose 400 mg two weeks later 4 tablet 0  . azelastine (ASTELIN) 0.1 % nasal spray Place 2 sprays into both nostrils 2 (two) times daily. 30 mL 5  . citalopram (CELEXA) 20 MG tablet Take by mouth.    . EPINEPHrine (AUVI-Q) 0.3 mg/0.3 mL IJ SOAJ injection Inject 0.3 mLs (0.3 mg total) into the muscle as needed for anaphylaxis. 2 each 1  . fexofenadine (ALLEGRA) 180 MG tablet Take 180 mg by mouth daily.    . pantoprazole (PROTONIX) 40 MG tablet Take 1 tablet (40 mg total) by mouth daily. 30 tablet 3  . Rimegepant Sulfate (NURTEC PO) Take by mouth.    . triamcinolone cream (KENALOG) 0.1 % SMARTSIG:Topical Twice a Week PRN    . VENTOLIN HFA 108 (90 Base) MCG/ACT inhaler Inhale 2 puffs into the lungs every 4 (four) hours as needed.     . Vitamin D, Ergocalciferol, (DRISDOL) 1.25 MG (50000 UNIT) CAPS capsule Take 50,000 Units by mouth once a week.     No current facility-administered medications on file prior to visit.    Allergies  Allergen Reactions  . Shellfish Allergy Anaphylaxis  . Hydrocodone Itching    Pt states he feels itchy after taking, but it doesn't preclude  him taking hydrocodone if needed About 2-3 years ago.  It was the first time he took hydrocodone.  . Other Diarrhea    Not anaphylaxis, but GI sensitivity.    Family History  Problem Relation Age of Onset  . Hypertension Mother   . Colon polyps Mother        might be mistakrn  . Diabetes Father        type II  . Migraines Sister   . Colon cancer Neg Hx   . Esophageal cancer Neg Hx   . Rectal cancer Neg Hx   . Stomach cancer Neg Hx     Social History   Socioeconomic History  . Marital status: Single    Spouse name: Not on file  . Number of children: 0  . Years of education: College  . Highest education level: Not on file  Occupational History  . Occupation: Sales executive  Tobacco Use  . Smoking status: Never Smoker  . Smokeless tobacco: Never Used  Vaping Use  . Vaping Use: Never used  Substance and Sexual Activity  . Alcohol use: Not Currently  . Drug use: Yes    Types: Marijuana    Comment: rare use  . Sexual activity: Yes    Partners: Female  Other Topics Concern  . Not  on file  Social History Narrative   Lives alone   Caffeine use: 1 cup coffee per day   Right handed    Social Determinants of Health   Financial Resource Strain:   . Difficulty of Paying Living Expenses:   Food Insecurity:   . Worried About Charity fundraiser in the Last Year:   . Arboriculturist in the Last Year:   Transportation Needs:   . Film/video editor (Medical):   Marland Kitchen Lack of Transportation (Non-Medical):   Physical Activity:   . Days of Exercise per Week:   . Minutes of Exercise per Session:   Stress:   . Feeling of Stress :   Social Connections:   . Frequency of Communication with Friends and Family:   . Frequency of Social Gatherings with Friends and Family:   . Attends Religious Services:   . Active Member of Clubs or Organizations:   . Attends Archivist Meetings:   Marland Kitchen Marital Status:    Review of Systems - See HPI.  All other ROS are negative.  BP  120/80   Pulse 60   Temp 98.6 F (37 C) (Temporal)   Resp 16   Ht 6\' 6"  (1.981 m)   Wt (!) 337 lb (152.9 kg)   SpO2 99%   BMI 38.94 kg/m   Physical Exam Vitals reviewed.  Constitutional:      Appearance: Normal appearance.  HENT:     Head: Normocephalic and atraumatic.  Cardiovascular:     Rate and Rhythm: Normal rate and regular rhythm.     Pulses: Normal pulses.     Heart sounds: Normal heart sounds.  Pulmonary:     Effort: Pulmonary effort is normal.     Breath sounds: Normal breath sounds.  Musculoskeletal:     Cervical back: Neck supple.  Skin:    Comments: Scattered areas of erythematous papules of extremities, most in clusters and following a linear or conglomerated pattern  Neurological:     Mental Status: He is alert.     Recent Results (from the past 2160 hour(s))  GI Profile, Stool, PCR     Status: Abnormal   Collection Time: 02/11/20  4:37 PM  Result Value Ref Range   Campylobacter Not Detected Not Detected   C difficile toxin A/B Not Detected Not Detected   Plesiomonas shigelloides Detected (A) Not Detected   Salmonella Not Detected Not Detected   Vibrio Not Detected Not Detected   Vibrio cholerae Not Detected Not Detected   Yersinia enterocolitica Not Detected Not Detected   Enteroaggregative E coli Not Detected Not Detected   Enteropathogenic E coli Not Detected Not Detected   Enterotoxigenic E coli Not Detected Not Detected   Shiga-toxin-producing E coli Not Detected Not Detected   E coli X323 Not applicable Not Detected   Shigella/Enteroinvasive E coli Not Detected Not Detected   Cryptosporidium Not Detected Not Detected   Cyclospora cayetanensis Not Detected Not Detected   Entamoeba histolytica Not Detected Not Detected   Giardia lamblia Not Detected Not Detected   Adenovirus F 40/41 Not Detected Not Detected   Astrovirus Not Detected Not Detected   Norovirus GI/GII Not Detected Not Detected   Rotavirus A Not Detected Not Detected    Sapovirus Not Detected Not Detected  C. trachomatis/N. gonorrhoeae RNA (GC/Chlamydia)     Status: None   Collection Time: 03/11/20  5:32 PM  Result Value Ref Range   C. trachomatis RNA, TMA NOT DETECTED NOT  DETECT   N. gonorrhoeae RNA, TMA NOT DETECTED NOT DETECT    Comment: The analytical performance characteristics of this assay, when used to test SurePath(TM) specimens have been determined by [employer]. The modifications have not been cleared or approved by the FDA. This assay has been validated pursuant to the CLIA regulations and is used for clinical purposes. . For additional information, please refer to https://education.quest[facility].com/faq/FAQ154 (This link is being provided for information/ educational purposes only.) .   POCT CBC w auto diff     Status: None   Collection Time: 03/11/20  6:17 PM  Result Value Ref Range   WBC      Comment: see scanned report   Lymphocytes relative %     Monocytes relative %     Neutrophils relative % (GR)     Lymphocytes absolute     Monocyes absolute     Neutrophils absolute (GR#)     RBC     Hemoglobin     Hematocrit     MCV     MCH     MCHC     RDW     Platelet count     MPV    COMPLETE METABOLIC PANEL WITH GFR     Status: None   Collection Time: 03/11/20  6:17 PM  Result Value Ref Range   Glucose, Bld 82 65 - 99 mg/dL    Comment: .            Fasting reference interval .    BUN 11 7 - 25 mg/dL   Creat 0.73 0.60 - 1.35 mg/dL   GFR, Est Non African American 130 > OR = 60 mL/min/1.77m2   GFR, Est African American 150 > OR = 60 mL/min/1.60m2   BUN/Creatinine Ratio NOT APPLICABLE 6 - 22 (calc)   Sodium 137 135 - 146 mmol/L   Potassium 4.0 3.5 - 5.3 mmol/L   Chloride 104 98 - 110 mmol/L   CO2 24 20 - 32 mmol/L   Calcium 9.4 8.6 - 10.3 mg/dL   Total Protein 7.3 6.1 - 8.1 g/dL   Albumin 4.7 3.6 - 5.1 g/dL   Globulin 2.6 1.9 - 3.7 g/dL (calc)   AG Ratio 1.8 1.0 - 2.5 (calc)   Total Bilirubin 0.6 0.2 - 1.2  mg/dL   Alkaline phosphatase (APISO) 66 36 - 130 U/L   AST 13 10 - 40 U/L   ALT 14 9 - 46 U/L  Lipase     Status: None   Collection Time: 03/11/20  6:17 PM  Result Value Ref Range   Lipase 12 7 - 60 U/L  HIV Antibody (routine testing w rflx)     Status: None   Collection Time: 03/11/20  6:17 PM  Result Value Ref Range   HIV 1&2 Ab, 4th Generation NON-REACTIVE NON-REACTI    Comment: HIV-1 antigen and HIV-1/HIV-2 antibodies were not detected. There is no laboratory evidence of HIV infection. Marland Kitchen PLEASE NOTE: This information has been disclosed to you from records whose confidentiality may be protected by state law.  If your state requires such protection, then the state law prohibits you from making any further disclosure of the information without the specific written consent of the person to whom it pertains, or as otherwise permitted by law. A general authorization for the release of medical or other information is NOT sufficient for this purpose. . For additional information please refer to http://education.quest[facility].com/faq/FAQ106 (This link is being provided for informational/ educational purposes only.) . Marland Kitchen  The performance of this assay has not been clinically validated in patients less than 50 years old. .   RPR     Status: None   Collection Time: 03/11/20  6:17 PM  Result Value Ref Range   RPR Ser Ql NON-REACTIVE NON-REACTI    Assessment/Plan: 1. Irritant contact dermatitis due to plants, except food Most likely plant dermatitis giving being outside and linear distribution of initial rash. Continue triamcinolone BID. Supportive measures reviewed. Declines steroid taper today. Will give 80 mg depomedrol. Follow-up discussed.  - methylPREDNISolone acetate (DEPO-MEDROL) injection 80 mg  This visit occurred during the SARS-CoV-2 public health emergency.  Safety protocols were in place, including screening questions prior to the visit, additional usage of staff  PPE, and extensive cleaning of exam room while observing appropriate contact time as indicated for disinfecting solutions.      Leeanne Rio, PA-C

## 2020-04-26 NOTE — Progress Notes (Addendum)
VIALS EXP 04-26-21 ADDITIONAL LABELS NEEDED

## 2020-04-26 NOTE — Patient Instructions (Signed)
Please keep skin clean and dry.  Ok to continue the triamcinolone cream twice daily to affected areas.  The steroid shot should help speed resolution.  Apply some witch hazel astringent to the areas to help dry them up.   Let me know if things are not resolving.

## 2020-04-27 ENCOUNTER — Encounter: Payer: Self-pay | Admitting: Family Medicine

## 2020-04-27 ENCOUNTER — Ambulatory Visit: Payer: 59 | Admitting: Gastroenterology

## 2020-04-27 ENCOUNTER — Ambulatory Visit (INDEPENDENT_AMBULATORY_CARE_PROVIDER_SITE_OTHER): Payer: 59 | Admitting: Family Medicine

## 2020-04-27 DIAGNOSIS — M545 Low back pain, unspecified: Secondary | ICD-10-CM

## 2020-04-27 DIAGNOSIS — Z6841 Body Mass Index (BMI) 40.0 and over, adult: Secondary | ICD-10-CM | POA: Diagnosis not present

## 2020-04-27 DIAGNOSIS — S76301A Unspecified injury of muscle, fascia and tendon of the posterior muscle group at thigh level, right thigh, initial encounter: Secondary | ICD-10-CM | POA: Diagnosis not present

## 2020-04-27 NOTE — Assessment & Plan Note (Signed)
Continues to have some intermittent pain with this.  This could be a reoccurrence of the exacerbation.  Discussed which activities to doing which wants to avoid.  Follow-up with me if any worsening pain but likely can do well with compression and home exercises which were given.

## 2020-04-27 NOTE — Patient Instructions (Signed)
Thigh compression with any activity Give the shoulder more time See me again in 2-3 months

## 2020-04-27 NOTE — Assessment & Plan Note (Signed)
Patient is making significant strides at this point with the weight loss.  Does have some mild loss of lordosis.  Patient denies any radiation the pain at the moment.  Has been doing well with the exercises as well.  Increase activity slowly.  Follow-up again in 4 to 8 weeks

## 2020-04-27 NOTE — Assessment & Plan Note (Signed)
Patient is making significant strides and has lost a great amount of weight.  Encouraged him to continue to do so I do believe that this will be the best for his musculoskeletal complaints.
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
## 2020-04-28 ENCOUNTER — Encounter: Payer: Self-pay | Admitting: Physician Assistant

## 2020-04-29 ENCOUNTER — Encounter: Payer: Self-pay | Admitting: Family Medicine

## 2020-04-29 ENCOUNTER — Ambulatory Visit: Payer: 59 | Admitting: Allergy

## 2020-04-29 ENCOUNTER — Ambulatory Visit (INDEPENDENT_AMBULATORY_CARE_PROVIDER_SITE_OTHER): Payer: 59 | Admitting: Family Medicine

## 2020-04-29 ENCOUNTER — Other Ambulatory Visit: Payer: Self-pay

## 2020-04-29 VITALS — BP 124/78 | HR 70 | Resp 18 | Ht 77.87 in | Wt 334.0 lb

## 2020-04-29 DIAGNOSIS — L259 Unspecified contact dermatitis, unspecified cause: Secondary | ICD-10-CM

## 2020-04-29 DIAGNOSIS — T7800XD Anaphylactic reaction due to unspecified food, subsequent encounter: Secondary | ICD-10-CM

## 2020-04-29 DIAGNOSIS — L282 Other prurigo: Secondary | ICD-10-CM

## 2020-04-29 DIAGNOSIS — J302 Other seasonal allergic rhinitis: Secondary | ICD-10-CM

## 2020-04-29 DIAGNOSIS — H1010 Acute atopic conjunctivitis, unspecified eye: Secondary | ICD-10-CM | POA: Diagnosis not present

## 2020-04-29 DIAGNOSIS — J4520 Mild intermittent asthma, uncomplicated: Secondary | ICD-10-CM | POA: Diagnosis not present

## 2020-04-29 DIAGNOSIS — K219 Gastro-esophageal reflux disease without esophagitis: Secondary | ICD-10-CM

## 2020-04-29 DIAGNOSIS — J3089 Other allergic rhinitis: Secondary | ICD-10-CM

## 2020-04-29 MED ORDER — FAMOTIDINE 20 MG PO TABS
20.0000 mg | ORAL_TABLET | Freq: Two times a day (BID) | ORAL | 5 refills | Status: DC
Start: 2020-04-29 — End: 2020-12-16

## 2020-04-29 MED ORDER — PREDNISONE 10 MG PO TABS: ORAL_TABLET | ORAL | 0 refills | Status: AC

## 2020-04-29 MED ORDER — MONTELUKAST SODIUM 10 MG PO TABS: 10.0000 mg | ORAL_TABLET | Freq: Every day | ORAL | 5 refills | Status: DC

## 2020-04-29 NOTE — Patient Instructions (Addendum)
Dermatitis/hives Use the lowest amount of medication while getting the most relief. . Allegra 180 mg twice a day and famotidine (Pepcid) 20 mg twice a day. If no symptoms for 7-14 days then decrease to. . Allegra 180 mg twice a day and famotidine (Pepcid) 20 mg once a day.  If no symptoms for 7-14 days then decrease to. . Allegra 180 mg twice a day.  If no symptoms for 7-14 days then decrease to. . Allegra 180 mg once a day.  May use Benadryl (diphenhydramine) as needed for breakthrough hives       If symptoms return, then step up dosage Restart montelukast 10 mg once a day to reduce hives/rash Continue triamcinolone cream 0.1% to red, itchy areas below your face Keep a detailed symptom journal including foods eaten, contact with allergens, medications taken, weather changes.   Allergic rhinitis Continue avoidance measures directed toward mold, cockroach, pollens, dog, and cat Continue allergy injections once a week and have access to an epinephrine auto-injector set Allegra 90 mg once a day as needed for a runny nose Azelastine 2 sprays in each nostril twice a day as needed for a runny nose Restart montelukast 10 mg once a day as above  Shortness of breath Continue albuterol 2 puffs every 4 hours as needed for cough or wheeze Restart montelukast 10 mg once a day as above  Reflux Continue dietary and lifestyle modifications Continue pantoprazole 40 mg once a day to control reflux.   Food allergy Continue to avoid shellfish. In case of an allergic reaction, take Benadryl 50 mg every 4 hours, and if life-threatening symptoms occur, inject with AuviQ 0.3 mg.  Call the clinic if this treatment plan is not working well for you  Follow up in 1 month or sooner if needed.  Reducing Pollen Exposure The American Academy of Allergy, Asthma and Immunology suggests the following steps to reduce your exposure to pollen during allergy seasons. 1. Do not hang sheets or clothing out to dry;  pollen may collect on these items. 2. Do not mow lawns or spend time around freshly cut grass; mowing stirs up pollen. 3. Keep windows closed at night.  Keep car windows closed while driving. 4. Minimize morning activities outdoors, a time when pollen counts are usually at their highest. 5. Stay indoors as much as possible when pollen counts or humidity is high and on windy days when pollen tends to remain in the air longer. 6. Use air conditioning when possible.  Many air conditioners have filters that trap the pollen spores. 7. Use a HEPA room air filter to remove pollen form the indoor air you breathe.  Control of Mold Allergen Mold and fungi can grow on a variety of surfaces provided certain temperature and moisture conditions exist.  Outdoor molds grow on plants, decaying vegetation and soil.  The major outdoor mold, Alternaria and Cladosporium, are found in very high numbers during hot and dry conditions.  Generally, a late Summer - Fall peak is seen for common outdoor fungal spores.  Rain will temporarily lower outdoor mold spore count, but counts rise rapidly when the rainy period ends.  The most important indoor molds are Aspergillus and Penicillium.  Dark, humid and poorly ventilated basements are ideal sites for mold growth.  The next most common sites of mold growth are the bathroom and the kitchen.  Outdoor [employer] 8. Use air conditioning and keep windows closed 9. Avoid exposure to decaying vegetation. 10. Avoid leaf raking. 11. Avoid grain  handling. 12. Consider wearing a face mask if working in moldy areas.  Indoor Mold Control 1. Maintain humidity below 50%. 2. Clean washable surfaces with 5% bleach solution. 3. Remove sources e.g. Contaminated carpets.  Control of Dog or Cat Allergen Avoidance is the best way to manage a dog or cat allergy. If you have a dog or cat and are allergic to dog or cats, consider removing the dog or cat from the home. If you have a dog or cat  but don't want to find it a new home, or if your family wants a pet even though someone in the household is allergic, here are some strategies that may help keep symptoms at bay:  13. Keep the pet out of your bedroom and restrict it to only a few rooms. Be advised that keeping the dog or cat in only one room will not limit the allergens to that room. 31. Don't pet, hug or kiss the dog or cat; if you do, wash your hands with soap and water. 15. High-efficiency particulate air (HEPA) cleaners run continuously in a bedroom or living room can reduce allergen levels over time. 16. Regular use of a high-efficiency vacuum cleaner or a central vacuum can reduce allergen levels. 17. Giving your dog or cat a bath at least once a week can reduce airborne allergen.  Control of Cockroach Allergen  Cockroach allergen has been identified as an important cause of acute attacks of asthma, especially in urban settings.  There are fifty-five species of cockroach that exist in the [place], however only three, the [place], [place] species produce allergen that can affect patients with Asthma.  Allergens can be obtained from fecal particles, egg casings and secretions from cockroaches.    1. Remove food sources. 2. Reduce access to water. 3. Seal access and entry points. 4. Spray runways with 0.5-1% Diazinon or Chlorpyrifos 5. Blow boric acid power under stoves and refrigerator. 6. Place bait stations (hydramethylnon) at feeding sites.

## 2020-04-29 NOTE — Progress Notes (Signed)
[address] [place] [address] Dept: [phone]  FOLLOW UP NOTE  Patient ID: Bradley [name], male    DOB: Dec 08, 1995  Age: 24 y.o. MRN: [id number] Date of Office Visit: 04/29/2020  Assessment  Chief Complaint: Pruritis (bumps and itching for about two weeks posion ivy end of last week)  HPI Bradley [name] is a 24 year old male who presents to the clinic for an evaluation of hives and itch. He was last seen in this clinic on 04/20/2020 by Dr. Maudie Mercury for evaluation of allergic rhinitis, shortness of breath, and food allergy to shellfish. At today's visit, he reports that, about 2 weeks ago, he began to experience raised, red, somewhat pruritic areas occurring on bilateral arms and one occurring on his scalp that lasted for 2-3 days before resolving completely with no residual bruising or hyperpigmentation. These raised areas are occurring once every day or every 2 days.  He denies concomitant cardiopulmonary or gastrointestinal symptoms with this rash. He has not experienced unexpected weight loss, recurrent fevers or drenching night sweats. No specific medication, food, skin care product, detergent, soap, or other environmental triggers have been identified. He does report that he began taking The symptoms do not seem to correlate with NSAIDs use or emotional stress. He did not have symptoms consistent with a respiratory tract infection at the time of symptom onset. Mendel has tried to control symptoms with OTC antihistamines which have offered minimal temporary relief. He has not been evaluated and treated in the emergency department for these symptoms. Skin biopsy has not been performed. About 1 week ago, he began to experience a few areas with clustered red, raised, pruritic areas including his left arm, left finger, left popliteal fossa and right popliteal fosse for which he went to his primary care provider. He was given a steroid injection and a steroid taper for contact dermatitis due to  poison ivy with moderate relief of symptoms. He continues Allegra 180 mg once a day as well as triamcinolone 0.1% cream twice a day as needed for itch. Asthma is reported as moderately well controlled with intermittent wheeze and cough producing mucus for which he uses albuterol infrequently. Allergic rhinitis is reported as moderately well controlled with occasional nasal congestion. He is not currently taking montelukast. His current medications are listed in the chart.    Drug Allergies:  Allergies  Allergen Reactions   Shellfish Allergy Anaphylaxis   Hydrocodone Itching    Pt states he feels itchy after taking, but it doesn't preclude him taking hydrocodone if needed About 2-3 years ago.  It was the first time he took hydrocodone.   Other Diarrhea    Not anaphylaxis, but GI sensitivity.    Physical Exam: BP 124/78 (BP Location: Left Arm, Patient Position: Sitting, Cuff Size: Large)    Pulse 70    Resp 18    Ht 6' 5.87" (1.978 m)    Wt (!) 334 lb (151.5 kg)    SpO2 97%    BMI 38.72 kg/m    Physical Exam Vitals reviewed.  Constitutional:      Appearance: Normal appearance.  HENT:     Head: Normocephalic and atraumatic.     Right Ear: Tympanic membrane normal.     Left Ear: Tympanic membrane normal.     Nose:     Comments: Bilateral nares normal. Pharynx erythematous with no exudate. Ears normal. Eyes normal. Eyes:     Conjunctiva/sclera: Conjunctivae normal.  Cardiovascular:  Rate and Rhythm: Normal rate and regular rhythm.     Heart sounds: Normal heart sounds. No murmur heard.   Pulmonary:     Effort: Pulmonary effort is normal.     Breath sounds: Normal breath sounds.     Comments: Lungs clear to auscultation Musculoskeletal:        General: Normal range of motion.     Cervical back: Normal range of motion and neck supple.  Skin:    General: Skin is warm.     Comments: Slightly raised, erythematous area noted on left forearm. No other lesions noted. No open areas  or drainage noted.  Neurological:     Mental Status: He is alert.     Diagnostics: FVC 6.45, FEV1 5.68. Predicted FVC 6.83, predicted FEV1 5.56. Spirometry indicates normal ventilatory function.    Assessment and Plan: 1. Mild intermittent asthma without complication   2. Seasonal and perennial allergic rhinoconjunctivitis   3. Seasonal allergic conjunctivitis   4. Anaphylactic shock due to food, subsequent encounter   5. Gastroesophageal reflux disease, unspecified whether esophagitis present   6. Contact dermatitis, unspecified contact dermatitis type, unspecified trigger   7. Papular urticaria     Meds ordered this encounter  Medications   famotidine (PEPCID) 20 MG tablet    Sig: Take 1 tablet (20 mg total) by mouth 2 (two) times daily.    Dispense:  60 tablet    Refill:  5   montelukast (SINGULAIR) 10 MG tablet    Sig: Take 1 tablet (10 mg total) by mouth at bedtime.    Dispense:  31 tablet    Refill:  5    Patient Instructions  Dermatitis/hives Use the lowest amount of medication while getting the most relief.  Allegra 180 mg twice a day and famotidine (Pepcid) 20 mg twice a day. If no symptoms for 7-14 days then decrease to  Allegra 180 mg twice a day and famotidine (Pepcid) 20 mg once a day.  If no symptoms for 7-14 days then decrease to  Allegra 180 mg twice a day.  If no symptoms for 7-14 days then decrease to  Allegra 180 mg once a day.  May use Benadryl (diphenhydramine) as needed for breakthrough hives       If symptoms return, then step up dosage Restart montelukast 10 mg once a day to reduce hives/rash Continue triamcinolone cream 0.1% to red, itchy areas below your face Keep a detailed symptom journal including foods eaten, contact with allergens, medications taken, weather changes.   Allergic rhinitis Continue avoidance measures directed toward mold, cockroach, pollens, dog, and cat Continue allergy injections once a week and have access to an  epinephrine auto-injector set Allegra 90 mg once a day as needed for a runny nose Azelastine 2 sprays in each nostril twice a day as needed for a runny nose Restart montelukast 10 mg once a day as above  Shortness of breath Continue albuterol 2 puffs every 4 hours as needed for cough or wheeze Restart montelukast 10 mg once a day as above  Reflux Continue dietary and lifestyle modifications Continue pantoprazole 40 mg once a day to control reflux.   Food allergy Continue to avoid shellfish. In case of an allergic reaction, take Benadryl 50 mg every 4 hours, and if life-threatening symptoms occur, inject with AuviQ 0.3 mg.  Call the clinic if this treatment plan is not working well for you  Follow up in 1 month or sooner if needed.   Return in  about 4 weeks (around 05/27/2020), or if symptoms worsen or fail to improve.    Thank you for the opportunity to care for this patient.  Please do not hesitate to contact me with questions.  Gareth Morgan, FNP [facility] [place]

## 2020-04-30 ENCOUNTER — Ambulatory Visit: Payer: 59 | Admitting: Nurse Practitioner

## 2020-04-30 ENCOUNTER — Encounter: Payer: Self-pay | Admitting: Physician Assistant

## 2020-04-30 ENCOUNTER — Encounter: Payer: Self-pay | Admitting: Family Medicine

## 2020-04-30 ENCOUNTER — Other Ambulatory Visit: Payer: Self-pay | Admitting: *Deleted

## 2020-04-30 ENCOUNTER — Ambulatory Visit (INDEPENDENT_AMBULATORY_CARE_PROVIDER_SITE_OTHER): Payer: 59 | Admitting: Physician Assistant

## 2020-04-30 VITALS — BP 120/78 | HR 71 | Temp 99.6°F | Resp 16 | Ht 77.8 in | Wt 333.0 lb

## 2020-04-30 DIAGNOSIS — R21 Rash and other nonspecific skin eruption: Secondary | ICD-10-CM | POA: Diagnosis not present

## 2020-04-30 MED ORDER — DESONIDE 0.05 % EX OINT: TOPICAL_OINTMENT | CUTANEOUS | 1 refills | Status: DC

## 2020-04-30 NOTE — Telephone Encounter (Signed)
Can you please make sure he is using the top of the antihistamine ladder that we talked about yesterday (it's in his AVS from yesterday) and also please have him continue the prednisone taper that his PCP gave him. Can you also please order a desonide 0.05% -apply to red itchy areas on the face twice a day as needed. Thank you

## 2020-04-30 NOTE — Progress Notes (Signed)
Patient presents to clinic today for repeat assessment of rash.  Patient was seen on Monday of this week by this provider and diagnosed with a contact dermatitis felt most likely due to plant giving linear distribution of rash.  Patient was given IM Depo-Medrol and started on topical steroids.  Notes the initial areas are drying up but are still present.  Since then he started developing new lesions that are more scattered over his body, flaring up and then resolving within 30 minutes.  Notes they seem urticarial in nature.  Patient did see his allergist yesterday for the symptoms.  Notes at that time his antihistamine regimen was changed and an H2 blocker was added.  Patient also notes he saw his dermatologist this morning for the rashes at which time a biopsy was taken.  States he followed up here afterwards because he noticed a recurrence of an urticarial rash of his forehead that is since resolved.  Denies fever, chills, malaise or fatigue.  Past Medical History:  Diagnosis Date  . Anxiety   . Chronic headaches   . Clavicular fracture   . History of chickenpox   . Mild intermittent asthma 06/07/2018  . Shingles    24 years old.    Current Outpatient Medications on File Prior to Visit  Medication Sig Dispense Refill  . albendazole (ALBENZA) 200 MG tablet Take 2 tablets (400 mg total) by mouth as directed. Take 400 mg x 1, then take second dose 400 mg two weeks later 4 tablet 0  . azelastine (ASTELIN) 0.1 % nasal spray Place 2 sprays into both nostrils 2 (two) times daily. 30 mL 5  . citalopram (CELEXA) 20 MG tablet Take by mouth.    . EPINEPHrine (AUVI-Q) 0.3 mg/0.3 mL IJ SOAJ injection Inject 0.3 mLs (0.3 mg total) into the muscle as needed for anaphylaxis. 2 each 1  . famotidine (PEPCID) 20 MG tablet Take 1 tablet (20 mg total) by mouth 2 (two) times daily. 60 tablet 5  . fexofenadine (ALLEGRA) 180 MG tablet Take 180 mg by mouth daily.    . montelukast (SINGULAIR) 10 MG tablet Take 1  tablet (10 mg total) by mouth at bedtime. 31 tablet 5  . pantoprazole (PROTONIX) 40 MG tablet Take 1 tablet (40 mg total) by mouth daily. 30 tablet 3  . Rimegepant Sulfate (NURTEC PO) Take by mouth.    . triamcinolone cream (KENALOG) 0.1 % SMARTSIG:Topical Twice a Week PRN    . Vitamin D, Ergocalciferol, (DRISDOL) 1.25 MG (50000 UNIT) CAPS capsule Take 50,000 Units by mouth once a week.    . predniSONE (DELTASONE) 10 MG tablet Take 4 tablets (40 mg total) by mouth daily with breakfast for 3 days, THEN 3 tablets (30 mg total) daily with breakfast for 3 days, THEN 2 tablets (20 mg total) daily with breakfast for 3 days, THEN 1 tablet (10 mg total) daily with breakfast for 3 days. (Patient not taking: Reported on 04/30/2020) 30 tablet 0   No current facility-administered medications on file prior to visit.    Allergies  Allergen Reactions  . Shellfish Allergy Anaphylaxis  . Hydrocodone Itching    Pt states he feels itchy after taking, but it doesn't preclude him taking hydrocodone if needed About 2-3 years ago.  It was the first time he took hydrocodone.  . Other Diarrhea    Not anaphylaxis, but GI sensitivity.    Family History  Problem Relation Age of Onset  . Hypertension Mother   .  Colon polyps Mother        might be mistakrn  . Diabetes Father        type II  . Migraines Sister   . Colon cancer Neg Hx   . Esophageal cancer Neg Hx   . Rectal cancer Neg Hx   . Stomach cancer Neg Hx     Social History   Socioeconomic History  . Marital status: Single    Spouse name: Not on file  . Number of children: 0  . Years of education: College  . Highest education level: Not on file  Occupational History  . Occupation: Sales executive  Tobacco Use  . Smoking status: Never Smoker  . Smokeless tobacco: Never Used  Vaping Use  . Vaping Use: Never used  Substance and Sexual Activity  . Alcohol use: Not Currently  . Drug use: Yes    Types: Marijuana    Comment: rare use  . Sexual  activity: Yes    Partners: Female  Other Topics Concern  . Not on file  Social History Narrative   Lives alone   Caffeine use: 1 cup coffee per day   Right handed    Social Determinants of Health   Financial Resource Strain:   . Difficulty of Paying Living Expenses:   Food Insecurity:   . Worried About Charity fundraiser in the Last Year:   . Arboriculturist in the Last Year:   Transportation Needs:   . Film/video editor (Medical):   Marland Kitchen Lack of Transportation (Non-Medical):   Physical Activity:   . Days of Exercise per Week:   . Minutes of Exercise per Session:   Stress:   . Feeling of Stress :   Social Connections:   . Frequency of Communication with Friends and Family:   . Frequency of Social Gatherings with Friends and Family:   . Attends Religious Services:   . Active Member of Clubs or Organizations:   . Attends Archivist Meetings:   Marland Kitchen Marital Status:     Review of Systems - See HPI.  All other ROS are negative.  BP 120/78   Pulse 71   Temp 99.6 F (37.6 C) (Temporal)   Resp 16   Ht 6' 5.8" (1.976 m)   Wt (!) 333 lb (151 kg)   SpO2 99%   BMI 38.68 kg/m   Physical Exam Vitals reviewed.  Constitutional:      Appearance: Normal appearance.  HENT:     Head: Normocephalic and atraumatic.  Cardiovascular:     Rate and Rhythm: Normal rate and regular rhythm.     Pulses: Normal pulses.     Heart sounds: Normal heart sounds.  Musculoskeletal:     Cervical back: Neck supple.  Skin:         Comments: No evidence of urticarial rash present at time of visit.  Neurological:     General: No focal deficit present.     Mental Status: He is alert and oriented to person, place, and time.  Psychiatric:        Mood and Affect: Mood normal.     Recent Results (from the past 2160 hour(s))  GI Profile, Stool, PCR     Status: Abnormal   Collection Time: 02/11/20  4:37 PM  Result Value Ref Range   Campylobacter Not Detected Not Detected   C  difficile toxin A/B Not Detected Not Detected   Plesiomonas shigelloides Detected (A) Not Detected  Salmonella Not Detected Not Detected   Vibrio Not Detected Not Detected   Vibrio cholerae Not Detected Not Detected   Yersinia enterocolitica Not Detected Not Detected   Enteroaggregative E coli Not Detected Not Detected   Enteropathogenic E coli Not Detected Not Detected   Enterotoxigenic E coli Not Detected Not Detected   Shiga-toxin-producing E coli Not Detected Not Detected   E coli E993 Not applicable Not Detected   Shigella/Enteroinvasive E coli Not Detected Not Detected   Cryptosporidium Not Detected Not Detected   Cyclospora cayetanensis Not Detected Not Detected   Entamoeba histolytica Not Detected Not Detected   Giardia lamblia Not Detected Not Detected   Adenovirus F 40/41 Not Detected Not Detected   Astrovirus Not Detected Not Detected   Norovirus GI/GII Not Detected Not Detected   Rotavirus A Not Detected Not Detected   Sapovirus Not Detected Not Detected  C. trachomatis/N. gonorrhoeae RNA (GC/Chlamydia)     Status: None   Collection Time: 03/11/20  5:32 PM  Result Value Ref Range   C. trachomatis RNA, TMA NOT DETECTED NOT DETECT   N. gonorrhoeae RNA, TMA NOT DETECTED NOT DETECT    Comment: The analytical performance characteristics of this assay, when used to test SurePath(TM) specimens have been determined by [employer]. The modifications have not been cleared or approved by the FDA. This assay has been validated pursuant to the CLIA regulations and is used for clinical purposes. . For additional information, please refer to https://education.questdiagnostics.com/faq/FAQ154 (This link is being provided for information/ educational purposes only.) .   POCT CBC w auto diff     Status: None   Collection Time: 03/11/20  6:17 PM  Result Value Ref Range   WBC      Comment: see scanned report   Lymphocytes relative %     Monocytes relative %     Neutrophils  relative % (GR)     Lymphocytes absolute     Monocyes absolute     Neutrophils absolute (GR#)     RBC     Hemoglobin     Hematocrit     MCV     MCH     MCHC     RDW     Platelet count     MPV    COMPLETE METABOLIC PANEL WITH GFR     Status: None   Collection Time: 03/11/20  6:17 PM  Result Value Ref Range   Glucose, Bld 82 65 - 99 mg/dL    Comment: .            Fasting reference interval .    BUN 11 7 - 25 mg/dL   Creat 0.73 0.60 - 1.35 mg/dL   GFR, Est Non African American 130 > OR = 60 mL/min/1.17m2   GFR, Est African American 150 > OR = 60 mL/min/1.24m2   BUN/Creatinine Ratio NOT APPLICABLE 6 - 22 (calc)   Sodium 137 135 - 146 mmol/L   Potassium 4.0 3.5 - 5.3 mmol/L   Chloride 104 98 - 110 mmol/L   CO2 24 20 - 32 mmol/L   Calcium 9.4 8.6 - 10.3 mg/dL   Total Protein 7.3 6.1 - 8.1 g/dL   Albumin 4.7 3.6 - 5.1 g/dL   Globulin 2.6 1.9 - 3.7 g/dL (calc)   AG Ratio 1.8 1.0 - 2.5 (calc)   Total Bilirubin 0.6 0.2 - 1.2 mg/dL   Alkaline phosphatase (APISO) 66 36 - 130 U/L   AST 13 10 - 40 U/L  ALT 14 9 - 46 U/L  Lipase     Status: None   Collection Time: 03/11/20  6:17 PM  Result Value Ref Range   Lipase 12 7 - 60 U/L  HIV Antibody (routine testing w rflx)     Status: None   Collection Time: 03/11/20  6:17 PM  Result Value Ref Range   HIV 1&2 Ab, 4th Generation NON-REACTIVE NON-REACTI    Comment: HIV-1 antigen and HIV-1/HIV-2 antibodies were not detected. There is no laboratory evidence of HIV infection. Marland Kitchen PLEASE NOTE: This information has been disclosed to you from records whose confidentiality may be protected by state law.  If your state requires such protection, then the state law prohibits you from making any further disclosure of the information without the specific written consent of the person to whom it pertains, or as otherwise permitted by law. A general authorization for the release of medical or other information is NOT sufficient for this  purpose. . For additional information please refer to http://education.questdiagnostics.com/faq/FAQ106 (This link is being provided for informational/ educational purposes only.) . Marland Kitchen The performance of this assay has not been clinically validated in patients less than 47 years old. .   RPR     Status: None   Collection Time: 03/11/20  6:17 PM  Result Value Ref Range   RPR Ser Ql NON-REACTIVE NON-REACTI    Assessment/Plan: 1. Rash Initially with concern for contact dermatitis.  This seems to be responding to treatments given.  Since then has developed an urticarial rash for which she is seen his allergist yesterday with change in regimen.  Was also evaluated this morning by dermatology with biopsies taken.  No new symptoms since his appointment with specialist this morning.  Recommend he continue care as directed by his specialist.  Does not need to continue seeing multiple providers for the same issue as this increases risk of polypharmacy, conflicting treatments, increased cost to patient.  He agrees to continue care per specialist.  We will follow-up with them if symptoms or not improving.  This visit occurred during the SARS-CoV-2 public health emergency.  Safety protocols were in place, including screening questions prior to the visit, additional usage of staff PPE, and extensive cleaning of exam room while observing appropriate contact time as indicated for disinfecting solutions.     Leeanne Rio, PA-C

## 2020-04-30 NOTE — Patient Instructions (Signed)
Limit direct sunlight when possible.  Continue care as directed by the Allergist and Dermatologist.  They will be taking over evaluation and management of the ongoing skin issues. I am glad they took a biopsy this morning. Those results will be very beneficial.

## 2020-05-05 ENCOUNTER — Encounter: Payer: Self-pay | Admitting: Physician Assistant

## 2020-05-05 ENCOUNTER — Ambulatory Visit (INDEPENDENT_AMBULATORY_CARE_PROVIDER_SITE_OTHER): Payer: 59 | Admitting: Physician Assistant

## 2020-05-05 ENCOUNTER — Other Ambulatory Visit: Payer: Self-pay

## 2020-05-05 VITALS — BP 120/82 | HR 67 | Temp 99.2°F | Resp 16 | Ht 77.8 in | Wt 333.0 lb

## 2020-05-05 DIAGNOSIS — B356 Tinea cruris: Secondary | ICD-10-CM

## 2020-05-05 NOTE — Patient Instructions (Addendum)
Keep skin clean and dry. Make sure if you get hot and sweaty to shower as soon as you are able to. Make sure to change your underwear. Always wear breathable undergarments -- cotton is best  Can apply OTC Lamisil to the areas in the inguinal region twice daily.  This should clear up over the next week. Make sure to follow-up with your Dermatologist for any ongoing skin issues as previously discussed.   Hang in there!

## 2020-05-05 NOTE — Progress Notes (Signed)
Patient presents to clinic today c/o itchy red rash of inguinal regions bilaterally. Feels this is likely yeast but wanted to get checked out. Some irritation noted in the area. Notes the rash is not on his genitals, just the inguinal folds. Denies fever, chills, malaise or fatigue. Marland Kitchen   Past Medical History:  Diagnosis Date  . Anxiety   . Chronic headaches   . Clavicular fracture   . History of chickenpox   . Mild intermittent asthma 06/07/2018  . Shingles    24 years old.    Current Outpatient Medications on File Prior to Visit  Medication Sig Dispense Refill  . albendazole (ALBENZA) 200 MG tablet Take 2 tablets (400 mg total) by mouth as directed. Take 400 mg x 1, then take second dose 400 mg two weeks later 4 tablet 0  . azelastine (ASTELIN) 0.1 % nasal spray Place 2 sprays into both nostrils 2 (two) times daily. 30 mL 5  . citalopram (CELEXA) 20 MG tablet Take by mouth.    . desonide (DESOWEN) 0.05 % ointment 1 application 2 times daily as needed to effected areas on the face. 15 g 1  . EPINEPHrine (AUVI-Q) 0.3 mg/0.3 mL IJ SOAJ injection Inject 0.3 mLs (0.3 mg total) into the muscle as needed for anaphylaxis. 2 each 1  . famotidine (PEPCID) 20 MG tablet Take 1 tablet (20 mg total) by mouth 2 (two) times daily. 60 tablet 5  . fexofenadine (ALLEGRA) 180 MG tablet Take 180 mg by mouth daily.    . montelukast (SINGULAIR) 10 MG tablet Take 1 tablet (10 mg total) by mouth at bedtime. 31 tablet 5  . pantoprazole (PROTONIX) 40 MG tablet Take 1 tablet (40 mg total) by mouth daily. 30 tablet 3  . predniSONE (DELTASONE) 10 MG tablet Take 4 tablets (40 mg total) by mouth daily with breakfast for 3 days, THEN 3 tablets (30 mg total) daily with breakfast for 3 days, THEN 2 tablets (20 mg total) daily with breakfast for 3 days, THEN 1 tablet (10 mg total) daily with breakfast for 3 days. 30 tablet 0  . Rimegepant Sulfate (NURTEC PO) Take by mouth.    . triamcinolone cream (KENALOG) 0.1 %  SMARTSIG:Topical Twice a Week PRN    . Vitamin D, Ergocalciferol, (DRISDOL) 1.25 MG (50000 UNIT) CAPS capsule Take 50,000 Units by mouth once a week.     No current facility-administered medications on file prior to visit.    Allergies  Allergen Reactions  . Shellfish Allergy Anaphylaxis  . Hydrocodone Itching    Pt states he feels itchy after taking, but it doesn't preclude him taking hydrocodone if needed About 2-3 years ago.  It was the first time he took hydrocodone.  . Other Diarrhea    Not anaphylaxis, but GI sensitivity.    Family History  Problem Relation Age of Onset  . Hypertension Mother   . Colon polyps Mother        might be mistakrn  . Diabetes Father        type II  . Migraines Sister   . Colon cancer Neg Hx   . Esophageal cancer Neg Hx   . Rectal cancer Neg Hx   . Stomach cancer Neg Hx     Social History   Socioeconomic History  . Marital status: Single    Spouse name: Not on file  . Number of children: 0  . Years of education: College  . Highest education level: Not on file  Occupational History  . Occupation: Sales executive  Tobacco Use  . Smoking status: Never Smoker  . Smokeless tobacco: Never Used  Vaping Use  . Vaping Use: Never used  Substance and Sexual Activity  . Alcohol use: Not Currently  . Drug use: Yes    Types: Marijuana    Comment: rare use  . Sexual activity: Yes    Partners: Female  Other Topics Concern  . Not on file  Social History Narrative   Lives alone   Caffeine use: 1 cup coffee per day   Right handed    Social Determinants of Health   Financial Resource Strain:   . Difficulty of Paying Living Expenses:   Food Insecurity:   . Worried About Charity fundraiser in the Last Year:   . Arboriculturist in the Last Year:   Transportation Needs:   . Film/video editor (Medical):   Marland Kitchen Lack of Transportation (Non-Medical):   Physical Activity:   . Days of Exercise per Week:   . Minutes of Exercise per Session:     Stress:   . Feeling of Stress :   Social Connections:   . Frequency of Communication with Friends and Family:   . Frequency of Social Gatherings with Friends and Family:   . Attends Religious Services:   . Active Member of Clubs or Organizations:   . Attends Archivist Meetings:   Marland Kitchen Marital Status:     Review of Systems - See HPI.  All other ROS are negative.  BP 120/82   Pulse 67   Temp 99.2 F (37.3 C) (Temporal)   Resp 16   Ht 6' 5.8" (1.976 m)   Wt (!) 333 lb (151 kg)   SpO2 98%   BMI 38.68 kg/m   Physical Exam Vitals reviewed.  Constitutional:      Appearance: Normal appearance.  HENT:     Head: Normocephalic and atraumatic.  Pulmonary:     Effort: Pulmonary effort is normal.     Breath sounds: Normal breath sounds.  Skin:      Neurological:     Mental Status: He is alert.     Recent Results (from the past 2160 hour(s))  GI Profile, Stool, PCR     Status: Abnormal   Collection Time: 02/11/20  4:37 PM  Result Value Ref Range   Campylobacter Not Detected Not Detected   C difficile toxin A/B Not Detected Not Detected   Plesiomonas shigelloides Detected (A) Not Detected   Salmonella Not Detected Not Detected   Vibrio Not Detected Not Detected   Vibrio cholerae Not Detected Not Detected   Yersinia enterocolitica Not Detected Not Detected   Enteroaggregative E coli Not Detected Not Detected   Enteropathogenic E coli Not Detected Not Detected   Enterotoxigenic E coli Not Detected Not Detected   Shiga-toxin-producing E coli Not Detected Not Detected   E coli S010 Not applicable Not Detected   Shigella/Enteroinvasive E coli Not Detected Not Detected   Cryptosporidium Not Detected Not Detected   Cyclospora cayetanensis Not Detected Not Detected   Entamoeba histolytica Not Detected Not Detected   Giardia lamblia Not Detected Not Detected   Adenovirus F 40/41 Not Detected Not Detected   Astrovirus Not Detected Not Detected   Norovirus GI/GII Not  Detected Not Detected   Rotavirus A Not Detected Not Detected   Sapovirus Not Detected Not Detected  C. trachomatis/N. gonorrhoeae RNA (GC/Chlamydia)     Status: None  Collection Time: 03/11/20  5:32 PM  Result Value Ref Range   C. trachomatis RNA, TMA NOT DETECTED NOT DETECT   N. gonorrhoeae RNA, TMA NOT DETECTED NOT DETECT    Comment: The analytical performance characteristics of this assay, when used to test SurePath(TM) specimens have been determined by [employer]. The modifications have not been cleared or approved by the FDA. This assay has been validated pursuant to the CLIA regulations and is used for clinical purposes. . For additional information, please refer to https://education.questdiagnostics.com/faq/FAQ154 (This link is being provided for information/ educational purposes only.) .   POCT CBC w auto diff     Status: None   Collection Time: 03/11/20  6:17 PM  Result Value Ref Range   WBC      Comment: see scanned report   Lymphocytes relative %     Monocytes relative %     Neutrophils relative % (GR)     Lymphocytes absolute     Monocyes absolute     Neutrophils absolute (GR#)     RBC     Hemoglobin     Hematocrit     MCV     MCH     MCHC     RDW     Platelet count     MPV    COMPLETE METABOLIC PANEL WITH GFR     Status: None   Collection Time: 03/11/20  6:17 PM  Result Value Ref Range   Glucose, Bld 82 65 - 99 mg/dL    Comment: .            Fasting reference interval .    BUN 11 7 - 25 mg/dL   Creat 0.73 0.60 - 1.35 mg/dL   GFR, Est Non African American 130 > OR = 60 mL/min/1.48m2   GFR, Est African American 150 > OR = 60 mL/min/1.49m2   BUN/Creatinine Ratio NOT APPLICABLE 6 - 22 (calc)   Sodium 137 135 - 146 mmol/L   Potassium 4.0 3.5 - 5.3 mmol/L   Chloride 104 98 - 110 mmol/L   CO2 24 20 - 32 mmol/L   Calcium 9.4 8.6 - 10.3 mg/dL   Total Protein 7.3 6.1 - 8.1 g/dL   Albumin 4.7 3.6 - 5.1 g/dL   Globulin 2.6 1.9 - 3.7 g/dL (calc)     AG Ratio 1.8 1.0 - 2.5 (calc)   Total Bilirubin 0.6 0.2 - 1.2 mg/dL   Alkaline phosphatase (APISO) 66 36 - 130 U/L   AST 13 10 - 40 U/L   ALT 14 9 - 46 U/L  Lipase     Status: None   Collection Time: 03/11/20  6:17 PM  Result Value Ref Range   Lipase 12 7 - 60 U/L  HIV Antibody (routine testing w rflx)     Status: None   Collection Time: 03/11/20  6:17 PM  Result Value Ref Range   HIV 1&2 Ab, 4th Generation NON-REACTIVE NON-REACTI    Comment: HIV-1 antigen and HIV-1/HIV-2 antibodies were not detected. There is no laboratory evidence of HIV infection. Marland Kitchen PLEASE NOTE: This information has been disclosed to you from records whose confidentiality may be protected by state law.  If your state requires such protection, then the state law prohibits you from making any further disclosure of the information without the specific written consent of the person to whom it pertains, or as otherwise permitted by law. A general authorization for the release of medical or other information is NOT sufficient  for this purpose. . For additional information please refer to http://education.questdiagnostics.com/faq/FAQ106 (This link is being provided for informational/ educational purposes only.) . Marland Kitchen The performance of this assay has not been clinically validated in patients less than 47 years old. .   RPR     Status: None   Collection Time: 03/11/20  6:17 PM  Result Value Ref Range   RPR Ser Ql NON-REACTIVE NON-REACTI    Assessment/Plan: 1. Tinea cruris Hygiene measures reviewed with patient. Start OTC Lamisil BID. Follow-up if symptoms are not resolving.   This visit occurred during the SARS-CoV-2 public health emergency.  Safety protocols were in place, including screening questions prior to the visit, additional usage of staff PPE, and extensive cleaning of exam room while observing appropriate contact time as indicated for disinfecting solutions.     Leeanne Rio, PA-C

## 2020-05-07 ENCOUNTER — Encounter: Payer: Self-pay | Admitting: Family

## 2020-05-07 ENCOUNTER — Ambulatory Visit (INDEPENDENT_AMBULATORY_CARE_PROVIDER_SITE_OTHER): Payer: 59 | Admitting: Family

## 2020-05-07 ENCOUNTER — Ambulatory Visit: Payer: Self-pay | Admitting: *Deleted

## 2020-05-07 ENCOUNTER — Other Ambulatory Visit: Payer: Self-pay

## 2020-05-07 VITALS — BP 132/62 | HR 89 | Temp 97.9°F | Resp 18 | Ht 78.0 in | Wt 337.4 lb

## 2020-05-07 DIAGNOSIS — H1010 Acute atopic conjunctivitis, unspecified eye: Secondary | ICD-10-CM

## 2020-05-07 DIAGNOSIS — J302 Other seasonal allergic rhinitis: Secondary | ICD-10-CM

## 2020-05-07 DIAGNOSIS — L282 Other prurigo: Secondary | ICD-10-CM | POA: Diagnosis not present

## 2020-05-07 DIAGNOSIS — R0602 Shortness of breath: Secondary | ICD-10-CM | POA: Diagnosis not present

## 2020-05-07 DIAGNOSIS — K219 Gastro-esophageal reflux disease without esophagitis: Secondary | ICD-10-CM

## 2020-05-07 DIAGNOSIS — J3089 Other allergic rhinitis: Secondary | ICD-10-CM

## 2020-05-07 DIAGNOSIS — J309 Allergic rhinitis, unspecified: Secondary | ICD-10-CM

## 2020-05-07 DIAGNOSIS — T7800XD Anaphylactic reaction due to unspecified food, subsequent encounter: Secondary | ICD-10-CM | POA: Diagnosis not present

## 2020-05-07 MED ORDER — AZELASTINE HCL 0.1 % NA SOLN: NASAL | 5 refills | Status: DC

## 2020-05-07 NOTE — Progress Notes (Signed)
[place] [address] Dept: [phone]  FOLLOW UP NOTE  Patient ID: Bradley [name], male    DOB: 14-Oct-1995  Age: 24 y.o. MRN: [id number] Date of Office Visit: 05/07/2020  Assessment  Chief Complaint: Allergic Rhinitis  (singular is kind of working, wants to see if it's o to resume allergy injections. inner thigh rash. It hurts and burns and is red. When it flares up it is warm to the touch. ) and Asthma (Short of breath in the mornings when he first wakes up but by mid morning it subsides.)  HPI Bradley [name] is a 24 year old male who presents today for follow-up of urticaria, seasonal and perennial allergic rhinoconjunctivitis, shortness of breath, reflux and food allergy. He was last seen on 04/29/2020 by Gareth Morgan, FNP.  Urticaria is reported as much better.  He reports that he is not flaring as much with taking Allegra 180 mg once a day, Singulair 10 mg at night ,and Pepcid at night.  He finished the steroids that was given to him by his primary care physician for his itching a couple days ago.  Since he was last seen he reports that he went to dermatology and had two biopsies done on his left lower leg due to the itching.  He has not received the results from his biopsy.  He also reports that that his primary care physician thinks that the area on his left forearm is due to poison oak.  Seasonal and perennial allergic rhinitis is reported as moderately controlled with Allegra 180 mg once a day, montelukast 10 mg once a day and azelastine 2 sprays each nostril twice a day as needed for runny nose.  He reports clear to white rhinorrhea and nasal congestion in the morning.  He has not had his allergy injections since having the itching and wonders if it is okay to restart.  He does feel that his allergy injections have helped some and denies any large local reactions.  He reports that for the past few weeks he has had some shortness of breath in the morning that will  last until 10 or 11:00 in the morning and then rest of the day he will feel fine.  He reports an occasional cough that is at times productive.  When it is productive it is clear to faint yellow in color. He denies any fever or chills.  He also reports occasional wheezing when he is breathing out hard.  Since his last office visit he used his albuterol once yesterday when he was having his shortness of breath and reports that this helped.  Reflux is reported as moderately controlled with Pepcid 20 mg once a day.  He reports that he stopped taking the pantoprazole that was prescribed by his primary care physician, because he did not want to take 2 medications for his reflux. He is going to discus with his primary care physician whether he needs to start back on his pantoprazole.  He continues to avoid shellfish without any accidental ingestion or use of his Auvi-Q.  He also reports that for the past few weeks inside his cheek and gums he is had little bumps that will come and go.  He denies any respiratory, cuteanous, and gastrointestinal symptoms when these bumps occur. He also reports that he has had bumps on his head that will show up, but he does not have any of these bumps now. He will also have itching on his head where his hair  is and his beard when he is getting a hot shower.   Drug Allergies:  Allergies  Allergen Reactions   Shellfish Allergy Anaphylaxis   Hydrocodone Itching    Pt states he feels itchy after taking, but it doesn't preclude him taking hydrocodone if needed About 2-3 years ago.  It was the first time he took hydrocodone.   Other Diarrhea    Not anaphylaxis, but GI sensitivity.    Review of Systems: Review of Systems  Constitutional: Negative for chills and fever.  HENT: Positive for congestion.        Reports rhinorrhea and post nasal drip  Eyes:       Denies itchy watery eyes  Respiratory: Positive for cough and wheezing.   Cardiovascular: Negative for chest  pain and palpitations.  Gastrointestinal: Positive for heartburn. Negative for abdominal pain.  Genitourinary: Negative for dysuria.  Neurological: Negative for headaches.  Endo/Heme/Allergies: Positive for environmental allergies.    Physical Exam: BP 132/62    Pulse 89    Temp 97.9 F (36.6 C)    Resp 18    Ht 6\' 6"  (1.981 m)    Wt (!) 337 lb 6.4 oz (153 kg)    SpO2 98%    BMI 38.99 kg/m    Physical Exam HENT:     Head: Normocephalic and atraumatic.     Right Ear: Tympanic membrane, ear canal and external ear normal.     Left Ear: Tympanic membrane, ear canal and external ear normal.     Ears:     Comments: Pharynx normal. Eyes normal. Ears normal. Nose normal    Nose: Nose normal.  Eyes:     Conjunctiva/sclera: Conjunctivae normal.  Cardiovascular:     Rate and Rhythm: Regular rhythm.     Heart sounds: Normal heart sounds.  Pulmonary:     Effort: Pulmonary effort is normal.     Breath sounds: Normal breath sounds.     Comments: Lungs clear to auscultation Musculoskeletal:     Cervical back: Neck supple.  Skin:    General: Skin is warm.     Comments: No urticarial lesions noted. Fine papular erythematous  rash noted on left groin area. 2 areas where punch biopsies were performed on lower left leg with scabs and no oozing or drainage. Left forearm with faintly erythematous papular lesions. Gums with no abnormalities  Neurological:     Mental Status: He is oriented to person, place, and time.  Psychiatric:        Mood and Affect: Mood normal.        Behavior: Behavior normal.        Thought Content: Thought content normal.        Judgment: Judgment normal.     Diagnostics: FVC 6.76 L, FEV1 5.42 L.  Predicted FVC 6.98 L, FEV1 5.67 L.  Spirometry indicates normal ventilatory function.  Assessment and Plan: 1. Papular urticaria   2. Shortness of breath   3. Seasonal and perennial allergic rhinoconjunctivitis   4. Anaphylactic shock due to food, subsequent encounter     5. Gastroesophageal reflux disease, unspecified whether esophagitis present     No orders of the defined types were placed in this encounter.   Patient Instructions  Hives Use the lowest amount of medication while getting the most relief.  Allegra 180 mg twice a day and famotidine (Pepcid) 20 mg twice a day. If no symptoms for 7-14 days then decrease to  Allegra 180 mg twice a day and  famotidine (Pepcid) 20 mg once a day.  If no symptoms for 7-14 days then decrease to  Allegra 180 mg twice a day.  If no symptoms for 7-14 days then decrease to  Allegra 180 mg once a day.  May use Benadryl (diphenhydramine) as needed for breakthrough hives       If symptoms return, then step up dosage Continue montelukast 10 mg once a day to reduce hives/rash Continue triamcinolone cream 0.1% to red, itchy areas below your face Continue Desonide 0.05% twice a day as needed to red itchy areas. This is safe for your face. Keep a detailed symptom journal including foods eaten, contact with allergens, medications taken, weather changes.   Allergic rhinitis Continue avoidance measures directed toward mold, cockroach, pollens, dog, and cat Continue allergy injections once a week and have access to an epinephrine auto-injector set Allegra 180 mg once a day as needed for a runny nose Azelastine 2 sprays in each nostril twice a day as needed for a runny nose Continue montelukast 10 mg once a day as above  Shortness of breath Continue albuterol 2 puffs every 4 hours as needed for cough, wheeze, tightness in chest, or shortness of breath Continue montelukast 10 mg once a day as above  Reflux Continue dietary and lifestyle modifications Continue pantoprazole 40 mg once a day to control reflux.   Food allergy Continue to avoid shellfish. In case of an allergic reaction, take Benadryl 50 mg every 4 hours, and if life-threatening symptoms occur, inject with AuviQ 0.3 mg.  Please let us know if this  treatment plan is not working well for you  Schedule follow up appointment in 6 months     Return in about 6 months (around 11/07/2020), or if symptoms worsen or fail to improve.    Thank you for the opportunity to care for this patient.  Please do not hesitate to contact me with questions.  Althea Charon, FNP [facility]

## 2020-05-07 NOTE — Patient Instructions (Addendum)
Hives Use the lowest amount of medication while getting the most relief. . Allegra 180 mg twice a day and famotidine (Pepcid) 20 mg twice a day. If no symptoms for 7-14 days then decrease to. . Allegra 180 mg twice a day and famotidine (Pepcid) 20 mg once a day.  If no symptoms for 7-14 days then decrease to. . Allegra 180 mg twice a day.  If no symptoms for 7-14 days then decrease to. . Allegra 180 mg once a day.  May use Benadryl (diphenhydramine) as needed for breakthrough hives       If symptoms return, then step up dosage Continue montelukast 10 mg once a day to reduce hives/rash Continue triamcinolone cream 0.1% to red, itchy areas below your face Continue Desonide 0.05% twice a day as needed to red itchy areas. This is safe for your face. Keep a detailed symptom journal including foods eaten, contact with allergens, medications taken, weather changes.   Allergic rhinitis Continue avoidance measures directed toward mold, cockroach, pollens, dog, and cat Ok to continue allergy injections once a week  Continue Allegra 180 mg once a day as needed for a runny nose  Continue Azelastine 2 sprays in each nostril twice a day as needed for a runny nose Continue montelukast 10 mg once a day as above  Shortness of breath Continue albuterol 2 puffs every 4 hours as needed for cough, wheeze, tightness in chest, or shortness of breath Continue montelukast 10 mg once a day as above  Reflux Continue dietary and lifestyle modifications  Discuss with your primary care physician about continuing pantoprazole 40 mg once a day to control reflux.   Food allergy Continue to avoid shellfish. In case of an allergic reaction, take Benadryl 50 mg every 4 hours, and if life-threatening symptoms occur, inject with AuviQ 0.3 mg.  Tinea Cruris Continue Lamisil as prescribed by your primary care physician for area on your left groin area.  Please contact your dentist with concerns about bumps on your  gums  Please let us know if this treatment plan is not working well for you  Schedule follow up appointment in 6 months

## 2020-05-12 ENCOUNTER — Encounter: Payer: Self-pay | Admitting: Family Medicine

## 2020-05-13 ENCOUNTER — Encounter: Payer: Self-pay | Admitting: Physician Assistant

## 2020-05-14 ENCOUNTER — Ambulatory Visit (INDEPENDENT_AMBULATORY_CARE_PROVIDER_SITE_OTHER): Payer: 59 | Admitting: Physician Assistant

## 2020-05-14 ENCOUNTER — Encounter: Payer: Self-pay | Admitting: Physician Assistant

## 2020-05-14 ENCOUNTER — Other Ambulatory Visit: Payer: Self-pay

## 2020-05-14 VITALS — BP 130/82 | HR 74 | Temp 98.8°F | Wt 337.4 lb

## 2020-05-14 DIAGNOSIS — K2900 Acute gastritis without bleeding: Secondary | ICD-10-CM | POA: Diagnosis not present

## 2020-05-14 DIAGNOSIS — L089 Local infection of the skin and subcutaneous tissue, unspecified: Secondary | ICD-10-CM

## 2020-05-14 DIAGNOSIS — T148XXA Other injury of unspecified body region, initial encounter: Secondary | ICD-10-CM

## 2020-05-14 NOTE — Progress Notes (Signed)
Patient presents to clinic today to discuss multiple issues.  Patient endorses having one episode of nonbloody emesis occurring on Tuesday after eating a heavy meal.  Notes since then he has had some left upper quadrant tenderness.  Has had some mild reflux symptoms.  Notes he stopped his Protonix sometime ago.  Denies any diarrhea or stool changes.  Denies fever, chills, malaise or fatigue.  Patient also endorses some drainage at punch biopsy site obtained by dermatology a couple of weeks ago.  Patient states he was evaluated by dermatology earlier this week at which time stitches were removed.  Was given prescription for Keflex as specialist felt that was mild infection in the lower biopsy site.  Patient endorses taking his antibiotic as directed.  Is still noticing some mild drainage and wanted checked out today.  Notes his dermatologist office was closed today.  Past Medical History:  Diagnosis Date  . Anxiety   . Chronic headaches   . Clavicular fracture   . History of chickenpox   . Mild intermittent asthma 06/07/2018  . Shingles    24 years old.    Current Outpatient Medications on File Prior to Visit  Medication Sig Dispense Refill  . albendazole (ALBENZA) 200 MG tablet Take 2 tablets (400 mg total) by mouth as directed. Take 400 mg x 1, then take second dose 400 mg two weeks later 4 tablet 0  . azelastine (ASTELIN) 0.1 % nasal spray Place 1-2 sprays in each nostril two times a day as needed for runny nose 30 mL 5  . citalopram (CELEXA) 20 MG tablet Take by mouth.    . desonide (DESOWEN) 0.05 % ointment 1 application 2 times daily as needed to effected areas on the face. 15 g 1  . EPINEPHrine (AUVI-Q) 0.3 mg/0.3 mL IJ SOAJ injection Inject 0.3 mLs (0.3 mg total) into the muscle as needed for anaphylaxis. 2 each 1  . famotidine (PEPCID) 20 MG tablet Take 1 tablet (20 mg total) by mouth 2 (two) times daily. 60 tablet 5  . fexofenadine (ALLEGRA) 180 MG tablet Take 180 mg by mouth  daily.    . montelukast (SINGULAIR) 10 MG tablet Take 1 tablet (10 mg total) by mouth at bedtime. 31 tablet 5  . pantoprazole (PROTONIX) 40 MG tablet Take 1 tablet (40 mg total) by mouth daily. 30 tablet 3  . Rimegepant Sulfate (NURTEC PO) Take by mouth.    . triamcinolone cream (KENALOG) 0.1 % SMARTSIG:Topical Twice a Week PRN    . Vitamin D, Ergocalciferol, (DRISDOL) 1.25 MG (50000 UNIT) CAPS capsule Take 50,000 Units by mouth once a week.      No current facility-administered medications on file prior to visit.    Allergies  Allergen Reactions  . Shellfish Allergy Anaphylaxis  . Hydrocodone Itching    Pt states he feels itchy after taking, but it doesn't preclude him taking hydrocodone if needed About 2-3 years ago.  It was the first time he took hydrocodone.  . Other Diarrhea    Not anaphylaxis, but GI sensitivity.    Family History  Problem Relation Age of Onset  . Hypertension Mother   . Colon polyps Mother        might be mistakrn  . Diabetes Father        type II  . Migraines Sister   . Colon cancer Neg Hx   . Esophageal cancer Neg Hx   . Rectal cancer Neg Hx   . Stomach cancer Neg  Hx     Social History   Socioeconomic History  . Marital status: Single    Spouse name: Not on file  . Number of children: 0  . Years of education: College  . Highest education level: Not on file  Occupational History  . Occupation: Sales executive  Tobacco Use  . Smoking status: Never Smoker  . Smokeless tobacco: Never Used  Vaping Use  . Vaping Use: Never used  Substance and Sexual Activity  . Alcohol use: Not Currently  . Drug use: Yes    Types: Marijuana    Comment: rare use  . Sexual activity: Yes    Partners: Female  Other Topics Concern  . Not on file  Social History Narrative   Lives alone   Caffeine use: 1 cup coffee per day   Right handed    Social Determinants of Health   Financial Resource Strain:   . Difficulty of Paying Living Expenses: Not on file  Food  Insecurity:   . Worried About Charity fundraiser in the Last Year: Not on file  . Ran Out of Food in the Last Year: Not on file  Transportation Needs:   . Lack of Transportation (Medical): Not on file  . Lack of Transportation (Non-Medical): Not on file  Physical Activity:   . Days of Exercise per Week: Not on file  . Minutes of Exercise per Session: Not on file  Stress:   . Feeling of Stress : Not on file  Social Connections:   . Frequency of Communication with Friends and Family: Not on file  . Frequency of Social Gatherings with Friends and Family: Not on file  . Attends Religious Services: Not on file  . Active Member of Clubs or Organizations: Not on file  . Attends Archivist Meetings: Not on file  . Marital Status: Not on file   Review of Systems - See HPI.  All other ROS are negative.  BP 130/82   Pulse 74   Temp 98.8 F (37.1 C) (Temporal)   Wt (!) 337 lb 6.4 oz (153 kg)   SpO2 98%   BMI 38.99 kg/m   Physical Exam Vitals reviewed.  Constitutional:      Appearance: He is well-developed.  HENT:     Head: Normocephalic and atraumatic.  Eyes:     Extraocular Movements: Extraocular movements intact.     Pupils: Pupils are equal, round, and reactive to light.  Cardiovascular:     Rate and Rhythm: Normal rate and regular rhythm.     Heart sounds: Normal heart sounds.  Pulmonary:     Effort: Pulmonary effort is normal.     Breath sounds: Normal breath sounds.  Abdominal:     General: Bowel sounds are normal.     Palpations: Abdomen is soft.     Tenderness: There is abdominal tenderness in the left upper quadrant.     Hernia: No hernia is present.  Skin:      Neurological:     General: No focal deficit present.     Mental Status: He is alert and oriented to person, place, and time.  Psychiatric:        Mood and Affect: Mood normal.     Recent Results (from the past 2160 hour(s))  C. trachomatis/N. gonorrhoeae RNA (GC/Chlamydia)     Status: None    Collection Time: 03/11/20  5:32 PM  Result Value Ref Range   C. trachomatis RNA, TMA NOT DETECTED NOT DETECT  N. gonorrhoeae RNA, TMA NOT DETECTED NOT DETECT    Comment: The analytical performance characteristics of this assay, when used to test SurePath(TM) specimens have been determined by [employer]. The modifications have not been cleared or approved by the FDA. This assay has been validated pursuant to the CLIA regulations and is used for clinical purposes. . For additional information, please refer to https://education.questdiagnostics.com/faq/FAQ154 (This link is being provided for information/ educational purposes only.) .   POCT CBC w auto diff     Status: None   Collection Time: 03/11/20  6:17 PM  Result Value Ref Range   WBC      Comment: see scanned report   Lymphocytes relative %     Monocytes relative %     Neutrophils relative % (GR)     Lymphocytes absolute     Monocyes absolute     Neutrophils absolute (GR#)     RBC     Hemoglobin     Hematocrit     MCV     MCH     MCHC     RDW     Platelet count     MPV    COMPLETE METABOLIC PANEL WITH GFR     Status: None   Collection Time: 03/11/20  6:17 PM  Result Value Ref Range   Glucose, Bld 82 65 - 99 mg/dL    Comment: .            Fasting reference interval .    BUN 11 7 - 25 mg/dL   Creat 0.73 0.60 - 1.35 mg/dL   GFR, Est Non African American 130 > OR = 60 mL/min/1.50m2   GFR, Est African American 150 > OR = 60 mL/min/1.41m2   BUN/Creatinine Ratio NOT APPLICABLE 6 - 22 (calc)   Sodium 137 135 - 146 mmol/L   Potassium 4.0 3.5 - 5.3 mmol/L   Chloride 104 98 - 110 mmol/L   CO2 24 20 - 32 mmol/L   Calcium 9.4 8.6 - 10.3 mg/dL   Total Protein 7.3 6.1 - 8.1 g/dL   Albumin 4.7 3.6 - 5.1 g/dL   Globulin 2.6 1.9 - 3.7 g/dL (calc)   AG Ratio 1.8 1.0 - 2.5 (calc)   Total Bilirubin 0.6 0.2 - 1.2 mg/dL   Alkaline phosphatase (APISO) 66 36 - 130 U/L   AST 13 10 - 40 U/L   ALT 14 9 - 46 U/L  Lipase      Status: None   Collection Time: 03/11/20  6:17 PM  Result Value Ref Range   Lipase 12 7 - 60 U/L  HIV Antibody (routine testing w rflx)     Status: None   Collection Time: 03/11/20  6:17 PM  Result Value Ref Range   HIV 1&2 Ab, 4th Generation NON-REACTIVE NON-REACTI    Comment: HIV-1 antigen and HIV-1/HIV-2 antibodies were not detected. There is no laboratory evidence of HIV infection. Marland Kitchen PLEASE NOTE: This information has been disclosed to you from records whose confidentiality may be protected by state law.  If your state requires such protection, then the state law prohibits you from making any further disclosure of the information without the specific written consent of the person to whom it pertains, or as otherwise permitted by law. A general authorization for the release of medical or other information is NOT sufficient for this purpose. . For additional information please refer to http://education.questdiagnostics.com/faq/FAQ106 (This link is being provided for informational/ educational purposes only.) . Marland Kitchen The  performance of this assay has not been clinically validated in patients less than 22 years old. .   RPR     Status: None   Collection Time: 03/11/20  6:17 PM  Result Value Ref Range   RPR Ser Ql NON-REACTIVE NON-REACTI    Assessment/Plan: 1. Acute gastritis without hemorrhage, unspecified gastritis type After eating a large meal which induce an episode of vomiting.  Some mild tenderness is to be expected, especially given he had stopped his acid reflux medicine despite chronic GERD.  We will have him restart Protonix daily.  Bland diet discussed.  Tylenol if needed for discomfort.  Avoid NSAIDs.  Follow-up if symptoms or not continuing to resolve over the weekend.  2. Local infection of wound Patient already on antibiotic given to him a couple of days ago from dermatology.  No evidence of worsening infection.  Encouraged him to take antibiotic as directed by  specialist.  Been care reviewed with patient.  Follow-up with dermatology next week.  This visit occurred during the SARS-CoV-2 public health emergency.  Safety protocols were in place, including screening questions prior to the visit, additional usage of staff PPE, and extensive cleaning of exam room while observing appropriate contact time as indicated for disinfecting solutions.     Leeanne Rio, PA-C

## 2020-05-14 NOTE — Patient Instructions (Signed)
Make sure to complete entire course of antibiotic given to you by Dermatology. Keep the skin clean and dry following their directions. Do not put on thick amounts of petroleum jelly. Keep bandaged if you are going to be out and about and getting sweaty/dirty. Shower as soon as you are home and dry areas well.   For occasional clogged bumps, pustule on leg -- try to use witch hazel astringent at the first sign.  unfortunately men are more prone due to increase in body hair, sweat, etc.   Restart your Protonix as directed. Avoid heavy lifting and exertion. Avoid NSAIDs like Ibuprofen and Aleve.  Symptoms should continue to resolve.  Make sure to follow-up with your specialists as scheduled.   Take care. Have a safe and fun trip!

## 2020-05-17 ENCOUNTER — Encounter: Payer: Self-pay | Admitting: Physician Assistant

## 2020-05-18 ENCOUNTER — Encounter: Payer: Self-pay | Admitting: Allergy

## 2020-05-18 ENCOUNTER — Ambulatory Visit (INDEPENDENT_AMBULATORY_CARE_PROVIDER_SITE_OTHER): Payer: 59 | Admitting: Allergy

## 2020-05-18 ENCOUNTER — Other Ambulatory Visit: Payer: Self-pay

## 2020-05-18 VITALS — BP 132/78 | HR 81 | Temp 98.1°F

## 2020-05-18 DIAGNOSIS — K219 Gastro-esophageal reflux disease without esophagitis: Secondary | ICD-10-CM

## 2020-05-18 DIAGNOSIS — J302 Other seasonal allergic rhinitis: Secondary | ICD-10-CM | POA: Diagnosis not present

## 2020-05-18 DIAGNOSIS — R0602 Shortness of breath: Secondary | ICD-10-CM

## 2020-05-18 DIAGNOSIS — L509 Urticaria, unspecified: Secondary | ICD-10-CM

## 2020-05-18 DIAGNOSIS — T7800XD Anaphylactic reaction due to unspecified food, subsequent encounter: Secondary | ICD-10-CM | POA: Diagnosis not present

## 2020-05-18 DIAGNOSIS — H1010 Acute atopic conjunctivitis, unspecified eye: Secondary | ICD-10-CM

## 2020-05-18 DIAGNOSIS — J3089 Other allergic rhinitis: Secondary | ICD-10-CM

## 2020-05-18 HISTORY — DX: Urticaria, unspecified: L50.9

## 2020-05-18 NOTE — Progress Notes (Signed)
Follow Up Note  RE: Bradley [name] MRN: [id number] DOB: 1996/07/10 Date of Office Visit: 05/18/2020  Referring provider: Delorse Limber Primary care provider: Brunetta Jeans, PA-C  Chief Complaint: Rash (red areas popping up on arms)  History of Present Illness: I had the pleasure of seeing Bradley [name] for a follow up visit at the [facility] on 05/19/2020. He is a 24 y.o. male, who is being followed for hives, allergic rhinitis on AIT, shortness of breath, reflux and food allergy. His previous allergy office visit was on 05/07/2020 with Althea Charon FNP. Today is a new complaint visit of rash.  Hives The past few days patient noted breaking out in hives which goes away within a few hours. This has been going on for about 2 months now but worsening the last few weeks.  Currently on Allegra 180mg  once a day and famotidine 20mg  once a day and Singulair once a day.   Questionable tick bite recently and consumes some red meat but did not notice it getting worse after eating red meat.   Patient had skin biopsy - which came back as hives and allergic dermatitis on his legs. He had poison ivy in one area.   Got stung by possibly a yellow jacket on his right upper extremity and a few days later developed hives in the area after taking a shower.   He did not eat his allergenic food - shellfish. Denies changes in his diet, personal care environment/products, recent infections.  He was started on Celexa around this time. No recent blood work.  Assessment and Plan: Bradley [name] is a 24 y.o. male with: Urticaria 2 month history of pruritic urticaria worsening the last few weeks. Had skin biopsy and one resulted as hives and the other as allergic dermatitis on his legs per patient report. Denies changes in diet, personal care environment/products, recent infections.  He was started on Celexa around this time. No recent blood work. . Start zyrtec or Xyzal 1 tablet twice a  day. . Start famotidine 20mg  twice a day. Avoid the following potential triggers: alcohol, tight clothing, NSAIDs.  Get blood work to rule out other etiologies.  Seasonal and perennial allergic rhinoconjunctivitis Past history -2019 blood work was positive to dust mites, cat, dog, grass, tree, borderline to ragweed, cockroach. Started AIT on 02/14/2018 (Mite-Cockroach-Ragweed & Pollen-Pet).   Continue avoidance measures directed toward mold, cockroach, pollens, dog, and cat.  Continue allergy injections once a week and have access to an epinephrine auto-injector set.  Azelastine 2 sprays in each nostril twice a day as needed for a runny nose.  Continue montelukast 10 mg once a day as above.  Gastroesophageal reflux disease  Continue dietary and lifestyle modifications  Continue pantoprazole 40 mg once a day to control reflux.   Shortness of breath  Continue albuterol 2 puffs every 4 hours as needed for cough, wheeze, tightness in chest, or shortness of breath  Continue montelukast 10 mg once a day as above.  Anaphylactic shock due to adverse food reaction Past history - 2020 bloodwork positive to shellfish.  Continue strict avoidance of shellfish.  For mild symptoms you can take over the counter antihistamines such as Benadryl and monitor symptoms closely. If symptoms worsen or if you have severe symptoms including breathing issues, throat closure, significant swelling, whole body hives, severe diarrhea and vomiting, lightheadedness then inject epinephrine and seek immediate medical care afterwards.  Food action plan in place.   Return  in about 2 months (around 07/18/2020).  Lab Orders     Alpha-Gal Panel     ANA w/Reflex     CBC with Differential/Platelet     Chronic Urticaria     Comprehensive metabolic panel     C-reactive protein     Tryptase     Thyroid Cascade Profile     Sedimentation rate  Diagnostics: None.  Medication List:  Current Outpatient  Medications  Medication Sig Dispense Refill  . albendazole (ALBENZA) 200 MG tablet Take 2 tablets (400 mg total) by mouth as directed. Take 400 mg x 1, then take second dose 400 mg two weeks later 4 tablet 0  . azelastine (ASTELIN) 0.1 % nasal spray Place 1-2 sprays in each nostril two times a day as needed for runny nose 30 mL 5  . citalopram (CELEXA) 20 MG tablet Take by mouth.    . desonide (DESOWEN) 0.05 % ointment 1 application 2 times daily as needed to effected areas on the face. 15 g 1  . EPINEPHrine (AUVI-Q) 0.3 mg/0.3 mL IJ SOAJ injection Inject 0.3 mLs (0.3 mg total) into the muscle as needed for anaphylaxis. 2 each 1  . famotidine (PEPCID) 20 MG tablet Take 1 tablet (20 mg total) by mouth 2 (two) times daily. 60 tablet 5  . fexofenadine (ALLEGRA) 180 MG tablet Take 180 mg by mouth daily.    . montelukast (SINGULAIR) 10 MG tablet Take 1 tablet (10 mg total) by mouth at bedtime. 31 tablet 5  . pantoprazole (PROTONIX) 40 MG tablet Take 1 tablet (40 mg total) by mouth daily. 30 tablet 3  . Rimegepant Sulfate (NURTEC PO) Take by mouth.    . triamcinolone cream (KENALOG) 0.1 % SMARTSIG:Topical Twice a Week PRN    . triamcinolone ointment (KENALOG) 0.1 % Apply topically.    . Vitamin D, Ergocalciferol, (DRISDOL) 1.25 MG (50000 UNIT) CAPS capsule Take 50,000 Units by mouth once a week.      No current facility-administered medications for this visit.   Allergies: Allergies  Allergen Reactions  . Shellfish Allergy Anaphylaxis  . Hydrocodone Itching    Pt states he feels itchy after taking, but it doesn't preclude him taking hydrocodone if needed About 2-3 years ago.  It was the first time he took hydrocodone.  . Other Diarrhea    Not anaphylaxis, but GI sensitivity.   I reviewed his past medical history, social history, family history, and environmental history and no significant changes have been reported from his previous visit.  Review of Systems  Constitutional: Negative for  appetite change, chills, fever and unexpected weight change.  HENT: Negative for congestion and rhinorrhea.   Eyes: Negative for itching.  Respiratory: Negative for cough, chest tightness, shortness of breath and wheezing.   Gastrointestinal: Negative for abdominal pain.  Skin: Positive for rash.  Allergic/Immunologic: Positive for environmental allergies.  Neurological: Negative for headaches.   Objective: BP 132/78 (BP Location: Right Arm, Patient Position: Sitting, Cuff Size: Large)   Pulse 81   Temp 98.1 F (36.7 C) (Temporal)   SpO2 97%  There is no height or weight on file to calculate BMI. Physical Exam Vitals and nursing note reviewed.  Constitutional:      Appearance: Normal appearance. He is well-developed.  HENT:     Head: Normocephalic and atraumatic.     Right Ear: Tympanic membrane and external ear normal.     Left Ear: Tympanic membrane and external ear normal.     Nose: Nose  normal.     Mouth/Throat:     Mouth: Mucous membranes are moist.     Pharynx: Oropharynx is clear.  Eyes:     Conjunctiva/sclera: Conjunctivae normal.  Cardiovascular:     Rate and Rhythm: Normal rate and regular rhythm.     Heart sounds: Normal heart sounds. No murmur heard.   Pulmonary:     Effort: Pulmonary effort is normal.     Breath sounds: Normal breath sounds. No wheezing, rhonchi or rales.  Musculoskeletal:     Cervical back: Neck supple.  Skin:    General: Skin is warm.     Comments: Stretch mark on left antecubital fossa. Quarter sized raised circular erythematous rash on upper back most likely secondary to insect/bug bite due to central lesion.   Neurological:     Mental Status: He is alert and oriented to person, place, and time.  Psychiatric:        Behavior: Behavior normal.    Previous notes and tests were reviewed. The plan was reviewed with the patient/family, and all questions/concerned were addressed.  It was my pleasure to see Jancarlo today and participate in  his care. Please feel free to contact me with any questions or concerns.  Sincerely,  Rexene Alberts, DO Allergy & Immunology  [facility] office: [phone] [facility] office: [place] office: [phone]

## 2020-05-18 NOTE — Patient Instructions (Addendum)
Rash: . Start zyrtec or xyzal 1 tablet twice a day. . Start famotidine 20mg  twice a day. Avoid the following potential triggers: alcohol, tight clothing, NSAIDs.  . Get bloodwork:  o We are ordering labs, so please allow 1-2 weeks for the results to come back. o With the newly implemented Cures Act, the labs might be visible to you at the same time that they become visible to me. However, I will not address the results until all of the results are back, so please be patient.   Allergic rhinitis  Continue avoidance measures directed toward mold, cockroach, pollens, dog, and cat Continue allergy injections once a week and have access to an epinephrine auto-injector set Azelastine 2 sprays in each nostril twice a day as needed for a runny nose Continue montelukast 10 mg once a day as above  Shortness of breath Continue albuterol 2 puffs every 4 hours as needed for cough, wheeze, tightness in chest, or shortness of breath Continue montelukast 10 mg once a day as above.  Reflux Continue dietary and lifestyle modifications Continue pantoprazole 40 mg once a day to control reflux.   Food allergy Continue to avoid shellfish. In case of an allergic reaction, take Benadryl 50 mg every 4 hours, and if life-threatening symptoms occur, inject with AuviQ 0.3 mg.  Follow up in 2 months or sooner if needed.

## 2020-05-19 ENCOUNTER — Encounter: Payer: Self-pay | Admitting: Allergy

## 2020-05-19 DIAGNOSIS — K219 Gastro-esophageal reflux disease without esophagitis: Secondary | ICD-10-CM | POA: Insufficient documentation

## 2020-05-19 NOTE — Assessment & Plan Note (Signed)
Past history -2019 blood work was positive to dust mites, cat, dog, grass, tree, borderline to ragweed, cockroach. Started AIT on 02/14/2018 (Mite-Cockroach-Ragweed & Pollen-Pet).   Continue avoidance measures directed toward mold, cockroach, pollens, dog, and cat.  Continue allergy injections once a week and have access to an epinephrine auto-injector set.  Azelastine 2 sprays in each nostril twice a day as needed for a runny nose.  Continue montelukast 10 mg once a day as above.

## 2020-05-19 NOTE — Assessment & Plan Note (Signed)
Past history - 2020 bloodwork positive to shellfish.  Continue strict avoidance of shellfish.  For mild symptoms you can take over the counter antihistamines such as Benadryl and monitor symptoms closely. If symptoms worsen or if you have severe symptoms including breathing issues, throat closure, significant swelling, whole body hives, severe diarrhea and vomiting, lightheadedness then inject epinephrine and seek immediate medical care afterwards.  Food action plan in place.

## 2020-05-19 NOTE — Assessment & Plan Note (Signed)
   Continue albuterol 2 puffs every 4 hours as needed for cough, wheeze, tightness in chest, or shortness of breath  Continue montelukast 10 mg once a day as above.

## 2020-05-19 NOTE — Assessment & Plan Note (Signed)
2 month history of pruritic urticaria worsening the last few weeks. Had skin biopsy and one resulted as hives and the other as allergic dermatitis on his legs per patient report. Denies changes in diet, personal care environment/products, recent infections.  He was started on Celexa around this time. No recent blood work. . Start zyrtec or Xyzal 1 tablet twice a day. . Start famotidine 20mg  twice a day. Avoid the following potential triggers: alcohol, tight clothing, NSAIDs.  Get blood work to rule out other etiologies.

## 2020-05-19 NOTE — Assessment & Plan Note (Signed)
   Continue dietary and lifestyle modifications  Continue pantoprazole 40 mg once a day to control reflux.

## 2020-05-20 ENCOUNTER — Ambulatory Visit: Payer: 59 | Admitting: Allergy

## 2020-05-20 ENCOUNTER — Encounter: Payer: Self-pay | Admitting: Family Medicine

## 2020-05-20 ENCOUNTER — Ambulatory Visit (INDEPENDENT_AMBULATORY_CARE_PROVIDER_SITE_OTHER): Payer: 59 | Admitting: Family Medicine

## 2020-05-20 ENCOUNTER — Other Ambulatory Visit: Payer: Self-pay

## 2020-05-20 VITALS — BP 130/78 | HR 72 | Ht 78.0 in | Wt 343.6 lb

## 2020-05-20 DIAGNOSIS — M545 Low back pain, unspecified: Secondary | ICD-10-CM

## 2020-05-20 NOTE — Progress Notes (Signed)
   I, Wendy Poet, LAT, ATC, am serving as scribe for Dr. Lynne Leader.  Bradley [name] is a 24 y.o. male who presents to [place] at [facility] today for f/u of L-sided LBP.  He was last seen by Dr. Tamala Julian on 04/27/20 and noted some new R thigh/hamstring pain and was advised to con't his HEP for his low back and use a thigh compression sleeve.  Since his last visit w/ Dr. Tamala Julian, pt reports con't L-sided LBP that has not improved.  He also wants to discuss some intermittent tingling in certain spots in his B lower legs.  He denies any radiating pain into his B LEs.  He intermittently performs his HEP.   Pertinent review of systems: No fevers or chills  Relevant historical information: Urticaria and atypical chest pain and esophageal spasm or dysmotility.   Exam:  BP 130/78 (BP Location: Right Arm, Patient Position: Sitting, Cuff Size: Large)   Pulse 72   Ht 6\' 6"  (1.981 m)   Wt (!) 343 lb 9.6 oz (155.9 kg)   SpO2 97%   BMI 39.71 kg/m  General: Well Developed, well nourished, and in no acute distress.   MSK: L-spine normal-appearing nontender midline.  Normal lumbar motion.  Lower extremity strength is intact.  Normal gait.  Negative slump test bilaterally.   Lab and Radiology Results X-ray images of L-spine and hips obtained during CT scan abdomen and pelvis October 16, 2019 personally independent reviewed.  No severe degenerative disc disease or hip OA.    Assessment and Plan: 24 y.o. male with low back pain mostly due to lumbosacral muscle dysfunction.  May have a component of some mild facet DJD that may be a component of this pain as well.  SI joint pain may also be responsible.  Plan for trial physical therapy.  If not better proceed with MRI of L-spine likely.    Orders Placed This Encounter  Procedures  . Ambulatory referral to Physical Therapy    Referral Priority:   Routine    Referral Type:   Physical Medicine    Referral Reason:   Specialty Services  Required    Requested Specialty:   Physical Therapy    Number of Visits Requested:   1   No orders of the defined types were placed in this encounter.    Discussed warning signs or symptoms. Please see discharge instructions. Patient expresses understanding.   The above documentation has been reviewed and is accurate and complete Lynne Leader, M.D.

## 2020-05-20 NOTE — Patient Instructions (Signed)
Thank you for coming in today. Plan for PT.  Keep [place] updated.  Keep an eye on the numbness in the leg (paresthesia).

## 2020-05-27 ENCOUNTER — Encounter: Payer: Self-pay | Admitting: Physician Assistant

## 2020-05-27 NOTE — Telephone Encounter (Signed)
I did not treat patient with antibiotics. Therefore recommend scheduling him for video visit to discuss and determine next steps.

## 2020-05-29 LAB — COMPREHENSIVE METABOLIC PANEL
ALT: 21 IU/L (ref 0–44)
AST: 13 IU/L (ref 0–40)
Albumin/Globulin Ratio: 2.2 (ref 1.2–2.2)
Albumin: 4.6 g/dL (ref 4.1–5.2)
Alkaline Phosphatase: 72 IU/L (ref 48–121)
BUN/Creatinine Ratio: 16 (ref 9–20)
BUN: 12 mg/dL (ref 6–20)
Bilirubin Total: 0.4 mg/dL (ref 0.0–1.2)
CO2: 25 mmol/L (ref 20–29)
Calcium: 9.1 mg/dL (ref 8.7–10.2)
Chloride: 102 mmol/L (ref 96–106)
Creatinine, Ser: 0.75 mg/dL — ABNORMAL LOW (ref 0.76–1.27)
GFR calc Af Amer: 148 mL/min/{1.73_m2} (ref >59)
GFR calc non Af Amer: 128 mL/min/{1.73_m2} (ref >59)
Globulin, Total: 2.1 g/dL (ref 1.5–4.5)
Glucose: 88 mg/dL (ref 65–99)
Potassium: 4.6 mmol/L (ref 3.5–5.2)
Sodium: 140 mmol/L (ref 134–144)
Total Protein: 6.7 g/dL (ref 6.0–8.5)

## 2020-05-29 LAB — CBC WITH DIFFERENTIAL/PLATELET
Basophils Absolute: 0 10*3/uL (ref 0.0–0.2)
Basos: 0 %
EOS (ABSOLUTE): 0.1 10*3/uL (ref 0.0–0.4)
Eos: 1 %
Hematocrit: 44.1 % (ref 37.5–51.0)
Hemoglobin: 14.8 g/dL (ref 13.0–17.7)
Immature Grans (Abs): 0 10*3/uL (ref 0.0–0.1)
Immature Granulocytes: 0 %
Lymphocytes Absolute: 1.7 10*3/uL (ref 0.7–3.1)
Lymphs: 23 %
MCH: 30.3 pg (ref 26.6–33.0)
MCHC: 33.6 g/dL (ref 31.5–35.7)
MCV: 90 fL (ref 79–97)
Monocytes Absolute: 0.4 10*3/uL (ref 0.1–0.9)
Monocytes: 5 %
Neutrophils Absolute: 5.1 10*3/uL (ref 1.4–7.0)
Neutrophils: 71 %
Platelets: 193 10*3/uL (ref 150–450)
RBC: 4.88 x10E6/uL (ref 4.14–5.80)
RDW: 12.3 % (ref 11.6–15.4)
WBC: 7.4 10*3/uL (ref 3.4–10.8)

## 2020-05-29 LAB — ALPHA-GAL PANEL
Alpha Gal IgE*: 0.75 kU/L — ABNORMAL HIGH (ref <0.10)
Beef (Bos spp) IgE: 0.51 kU/L — ABNORMAL HIGH (ref <0.35)
Class Interpretation: 1
Class Interpretation: 1
Lamb/Mutton (Ovis spp) IgE: 0.38 kU/L — ABNORMAL HIGH (ref <0.35)
Pork (Sus spp) IgE: 0.32 kU/L (ref <0.35)

## 2020-05-29 LAB — ANA W/REFLEX: Anti Nuclear Antibody (ANA): NEGATIVE

## 2020-05-29 LAB — SEDIMENTATION RATE: Sed Rate: 7 mm/hr (ref 0–15)

## 2020-05-29 LAB — C-REACTIVE PROTEIN: CRP: 4 mg/L (ref 0–10)

## 2020-05-29 LAB — TRYPTASE: Tryptase: 6.5 ug/L (ref 2.2–13.2)

## 2020-05-29 LAB — THYROID CASCADE PROFILE: TSH: 1.12 u[IU]/mL (ref 0.450–4.500)

## 2020-05-29 LAB — CHRONIC URTICARIA: cu index: 4.7 (ref <10)

## 2020-06-01 ENCOUNTER — Encounter: Payer: Self-pay | Admitting: Allergy

## 2020-06-02 ENCOUNTER — Other Ambulatory Visit: Payer: Self-pay

## 2020-06-02 ENCOUNTER — Encounter: Payer: Self-pay | Admitting: Physician Assistant

## 2020-06-02 ENCOUNTER — Telehealth (INDEPENDENT_AMBULATORY_CARE_PROVIDER_SITE_OTHER): Payer: 59 | Admitting: Physician Assistant

## 2020-06-02 DIAGNOSIS — Z91018 Allergy to other foods: Secondary | ICD-10-CM

## 2020-06-02 DIAGNOSIS — F419 Anxiety disorder, unspecified: Secondary | ICD-10-CM

## 2020-06-02 NOTE — Progress Notes (Signed)
I have discussed the procedure for the virtual visit with the patient who has given consent to proceed with assessment and treatment.   Castor Gittleman S Felicita Nuncio, CMA     

## 2020-06-02 NOTE — Progress Notes (Signed)
Virtual Visit via Video   I connected with patient on 06/02/20 at  1:00 PM EDT by a video enabled telemedicine application and verified that I am speaking with the correct person using two identifiers.  Location patient: Home Location provider: [facility], Office Persons participating in the virtual visit: Patient, Provider, [place] (Patina Moore)  I discussed the limitations of evaluation and management by telemedicine and the availability of in person appointments. The patient expressed understanding and agreed to proceed.  Subjective:   HPI:   Patient presents via [facility] today to discuss sweating as a possible side effect of Citalopram. Notes medication has worked extremely well for his anxiety, noting he is much calmer with stable mood as well. Notes he sweats more easily in the heat and was not sure if this could potentially be from the medication. Has noted occasional sweat rash in groin. Otherwise the sweating does not bother him and does not occur when he is indoors.   Patient also notes he was recently tested for alpha gal and tested mildly positive. Has stopped red meat intake over a few days.     ROS:   See pertinent positives and negatives per HPI.  Patient Active Problem List   Diagnosis Date Noted   Gastroesophageal reflux disease 05/19/2020   Urticaria 05/18/2020   Seasonal and perennial allergic rhinoconjunctivitis 04/19/2020   Mild persistent asthma, uncomplicated 25/36/6440   Low back pain 12/18/2019   Shortness of breath 10/15/2019   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   Hamstring injury, right, initial encounter 01/23/2019   Migraine without aura and without status migrainosus, not intractable 07/10/2018   Morbid obesity with BMI of 40.0-44.9, adult ([place]) 07/10/2018   Mild intermittent asthma without complication 34/74/2595   Atypical chest pain 12/21/2017   Generalized abdominal pain 12/17/2017   Anxiety  11/28/2017    Social History   Tobacco Use   Smoking status: Never Smoker   Smokeless tobacco: Never Used  Substance Use Topics   Alcohol use: Not Currently    Current Outpatient Medications:    albendazole (ALBENZA) 200 MG tablet, Take 2 tablets (400 mg total) by mouth as directed. Take 400 mg x 1, then take second dose 400 mg two weeks later, Disp: 4 tablet, Rfl: 0   azelastine (ASTELIN) 0.1 % nasal spray, Place 1-2 sprays in each nostril two times a day as needed for runny nose, Disp: 30 mL, Rfl: 5   citalopram (CELEXA) 20 MG tablet, Take by mouth., Disp: , Rfl:    desonide (DESOWEN) 0.05 % ointment, 1 application 2 times daily as needed to effected areas on the face., Disp: 15 g, Rfl: 1   EPINEPHrine (AUVI-Q) 0.3 mg/0.3 mL IJ SOAJ injection, Inject 0.3 mLs (0.3 mg total) into the muscle as needed for anaphylaxis., Disp: 2 each, Rfl: 1   famotidine (PEPCID) 20 MG tablet, Take 1 tablet (20 mg total) by mouth 2 (two) times daily., Disp: 60 tablet, Rfl: 5   fexofenadine (ALLEGRA) 180 MG tablet, Take 180 mg by mouth daily., Disp: , Rfl:    montelukast (SINGULAIR) 10 MG tablet, Take 1 tablet (10 mg total) by mouth at bedtime., Disp: 31 tablet, Rfl: 5   pantoprazole (PROTONIX) 40 MG tablet, Take 1 tablet (40 mg total) by mouth daily., Disp: 30 tablet, Rfl: 3   Rimegepant Sulfate (NURTEC PO), Take by mouth., Disp: , Rfl:    triamcinolone cream (KENALOG) 0.1 %, SMARTSIG:Topical Twice a Week PRN, Disp: , Rfl:  triamcinolone ointment (KENALOG) 0.1 %, Apply topically., Disp: , Rfl:    Vitamin D, Ergocalciferol, (DRISDOL) 1.25 MG (50000 UNIT) CAPS capsule, Take 50,000 Units by mouth once a week. , Disp: , Rfl:   Allergies  Allergen Reactions   Shellfish Allergy Anaphylaxis   Hydrocodone Itching    Pt states he feels itchy after taking, but it doesn't preclude him taking hydrocodone if needed About 2-3 years ago.  It was the first time he took hydrocodone.   Other Diarrhea      Not anaphylaxis, but GI sensitivity.    Objective:   There were no vitals taken for this visit.  Patient is well-developed, well-nourished in no acute distress.  Resting comfortably at home.  Head is normocephalic, atraumatic.  No labored breathing.  Speech is clear and coherent with logical content.  Patient is alert and oriented at baseline.   Assessment and Plan:   1. Anxiety Anxiety and mood are very well-controlled. Discussed with patient that SSRI can cause increase sweating but it seems this issue is only when he is out in the heat (currently ranging mid 80s-90s). Discussed this could be related to just the heat and humidity itself coupled with bmi. He does not wish to change medication. Will monitor as weather gets cooler. Diet and exercise for weight loss reviewed.   2. Allergy to alpha-gal Mild reactivity per Allergist testing. Continue care per specialist.     Leeanne Rio, PA-C 06/02/2020

## 2020-06-03 ENCOUNTER — Ambulatory Visit: Payer: 59 | Admitting: Gastroenterology

## 2020-06-07 ENCOUNTER — Ambulatory Visit: Payer: 59 | Admitting: Nurse Practitioner

## 2020-06-07 ENCOUNTER — Other Ambulatory Visit (HOSPITAL_COMMUNITY)
Admission: RE | Admit: 2020-06-07 | Discharge: 2020-06-07 | Disposition: A | Discharge status: Home / Self Care | Payer: 59 | Source: Ambulatory Visit | Attending: Gastroenterology | Admitting: Gastroenterology

## 2020-06-07 DIAGNOSIS — Z01812 Encounter for preprocedural laboratory examination: Secondary | ICD-10-CM | POA: Insufficient documentation

## 2020-06-07 DIAGNOSIS — Z20822 Contact with and (suspected) exposure to covid-19: Secondary | ICD-10-CM | POA: Diagnosis not present

## 2020-06-07 LAB — SARS CORONAVIRUS 2 (TAT 6-24 HRS): SARS Coronavirus 2: NEGATIVE

## 2020-06-09 ENCOUNTER — Ambulatory Visit (HOSPITAL_COMMUNITY)
Admission: RE | Admit: 2020-06-09 | Discharge: 2020-06-09 | Disposition: A | Discharge status: Home / Self Care | Payer: 59 | Attending: Gastroenterology | Admitting: Gastroenterology

## 2020-06-09 ENCOUNTER — Encounter (HOSPITAL_COMMUNITY)
Admission: RE | Disposition: A | Discharge status: Home / Self Care | Payer: Self-pay | Source: Home / Self Care | Attending: Gastroenterology

## 2020-06-09 DIAGNOSIS — R1310 Dysphagia, unspecified: Secondary | ICD-10-CM | POA: Diagnosis not present

## 2020-06-09 HISTORY — PX: ESOPHAGEAL MANOMETRY: SHX5429

## 2020-06-09 SURGERY — MANOMETRY, ESOPHAGUS

## 2020-06-09 MED ORDER — LIDOCAINE VISCOUS HCL 2 % MT SOLN
OROMUCOSAL | Status: AC
  Filled 2020-06-09: qty 15

## 2020-06-09 SURGICAL SUPPLY — 2 items
FACESHIELD LNG OPTICON STERILE (SAFETY) IMPLANT
GLOVE BIO SURGEON STRL SZ8 (GLOVE) ×4 IMPLANT

## 2020-06-09 NOTE — Progress Notes (Signed)
Esophageal manometry performed per protocol.  Patient tolerated well.  Report to be sent to Dr. Kavitha Nandigam 

## 2020-06-10 ENCOUNTER — Encounter (HOSPITAL_COMMUNITY): Payer: Self-pay | Admitting: Gastroenterology

## 2020-06-15 ENCOUNTER — Ambulatory Visit (INDEPENDENT_AMBULATORY_CARE_PROVIDER_SITE_OTHER): Payer: 59 | Admitting: Family Medicine

## 2020-06-15 ENCOUNTER — Encounter: Payer: Self-pay | Admitting: Family Medicine

## 2020-06-15 ENCOUNTER — Ambulatory Visit: Payer: Self-pay

## 2020-06-15 ENCOUNTER — Other Ambulatory Visit: Payer: Self-pay

## 2020-06-15 VITALS — BP 120/70 | HR 73 | Ht 78.0 in | Wt 341.0 lb

## 2020-06-15 DIAGNOSIS — M25571 Pain in right ankle and joints of right foot: Secondary | ICD-10-CM

## 2020-06-15 NOTE — Progress Notes (Signed)
   Rito Ehrlich, am serving as a Education administrator for Dr. Lynne Leader.  Bradley [name] is a 24 y.o. male who presents to [place] at [facility] today for R ankle pain following a R  fracture he sustained on 06/01/2020.  He has been seen by podiatry (Dr Gershon Mussel foot and ankle)  for this 06/03/2020.  He locates his pain to medial R ankle. States still tender.  R ankle swelling: maybe has an area that is jutted out but not sure if it is swelling. Aggravating factors: painful to the touch Treatments tried: wearing the boot; ice; elevation  Diagnostic imaging: not avaliable   Pertinent review of systems: No fevers or chills  Relevant historical information: No fevers or chills   Exam:  BP 120/70 (BP Location: Left Arm, Patient Position: Sitting, Cuff Size: Large)   Pulse 73   Ht 6\' 6"  (1.981 m)   Wt (!) 341 lb (154.7 kg)   SpO2 98%   BMI 39.41 kg/m  General: Well Developed, well nourished, and in no acute distress.   MSK: Right lower leg normal-appearing without significant deformity.  Nontender lateral lower leg at area of concern. Tender palpation medial malleolus. Decreased ankle motion with a little bit of guarding. Pulses cap refill and sensation are intact distally.    Lab and Radiology Results Diagnostic Limited MSK Ultrasound of: Right ankle Normal appearance soft tissue right lateral ankle.  Patient is very concerned is near muscle tendinous junction of peroneal tendons. No significant abnormality seen with musculoskeletal ultrasound at medial malleolus area of tenderness. Impression: Largely normal-appearing area of concern lateral ankle     Assessment and Plan: 24 y.o. male with subtle swelling right lateral ankle during the course of fracture management for what sounds like an avulsion fracture at the medial malleolus.  Unfortunately at this time we do not have medical records no imaging available obtained at podiatry.  Patient patient's description it  sounds like he has a small subtle avulsion fracture.  He has been treated with a cam walker boot which is appropriate and I think some of the swelling is having is due to pressure from the rigidity of the cam walker boot.  Discussed options.  He is doing pretty well with the boot and recommend that he continue as directed.  If needed could transition to potentially Aircast or even ASO ankle brace.  Reassured patient and recheck back as needed.  Of note low probability for DVT however if needed or if worsening in the future would proceed with diagnostic vascular ultrasound for DVT.  CC: Dr Gershon Mussel foot [facility]  Fax: [phone]  PDMP not reviewed this encounter. Orders Placed This Encounter  Procedures  . [place] LIMITED JOINT SPACE STRUCTURES LOW RIGHT(NO LINKED CHARGES)    Standing Status:   Future    Number of Occurrences:   1    Standing Expiration Date:   06/15/2021    Order Specific Question:   Reason for Exam (SYMPTOM  OR DIAGNOSIS REQUIRED)    Answer:   Right ankle pain    Order Specific Question:   Preferred imaging location?    Answer:   [place]   No orders of the defined types were placed in this encounter.    Discussed warning signs or symptoms. Please see discharge instructions. Patient expresses understanding.   The above documentation has been reviewed and is accurate and complete Lynne Leader, M.D.

## 2020-06-15 NOTE — Patient Instructions (Signed)
Thank you for coming in today.  I think that swelling if due to limping and the boot.  Follow up with podiatry or myself for future fracture management.  Likely will switch to brace at next follow up with podiatry.   Continue using long ski sock with boot.

## 2020-06-16 ENCOUNTER — Ambulatory Visit: Payer: 59 | Admitting: Family Medicine

## 2020-06-16 ENCOUNTER — Telehealth: Payer: Self-pay | Admitting: Physician Assistant

## 2020-06-16 ENCOUNTER — Ambulatory Visit (INDEPENDENT_AMBULATORY_CARE_PROVIDER_SITE_OTHER): Payer: 59 | Admitting: Family Medicine

## 2020-06-16 ENCOUNTER — Other Ambulatory Visit: Payer: Self-pay

## 2020-06-16 VITALS — BP 118/80 | HR 68 | Temp 98.9°F | Wt 347.4 lb

## 2020-06-16 DIAGNOSIS — R1011 Right upper quadrant pain: Secondary | ICD-10-CM | POA: Diagnosis not present

## 2020-06-16 NOTE — Patient Instructions (Signed)
Follow up for any nausea/vomitng, fever, or progressive pain.

## 2020-06-16 NOTE — Progress Notes (Signed)
Established Patient Office Visit  Subjective:  Patient ID: Bradley [name], male    DOB: [birth year]  Age: 24 y.o. MRN: [id number]  CC: No chief complaint on file.   HPI Bradley [name] presents for right upper quadrant abdominal pain/chest wall pain.  Onset several days ago.  Pain is worse with movement.  Denies any injury.  He denies any associated nausea or vomiting.  No significant radiation.   Pain is relatively mild and 5 out of 10 at its worst.  Somewhat intermittent.  No postprandial pain.  No clear triggers other than the fact this is worse with movement.  Has not noted any visible swelling or rash.  No cough.  No dyspnea.  No pleuritic pain.  He had CT scan back in January of this year which showed no evidence for any liver problems or gallstones.  He had recent lab work through allergist end of August including hepatic panel which was normal.  He denies any significant epigastric pain.  Previous imaging studies and recent labs reviewed.  He does have reported IBS history and states he has intermittent constipation but is having fairly regular bowel movements.  No new activities.  Past Medical History:  Diagnosis Date  . Anxiety   . Chronic headaches   . Clavicular fracture   . History of chickenpox   . Mild intermittent asthma 06/07/2018  . Shingles    24 years old.  Marland Kitchen Urticaria 05/18/2020    Past Surgical History:  Procedure Laterality Date  . CYSTECTOMY  01/2019   scrotum area   . ESOPHAGEAL MANOMETRY N/A 06/09/2020   Procedure: ESOPHAGEAL MANOMETRY (EM);  Surgeon: Lavena Bullion, DO;  Location: [facility];  Service: Gastroenterology;  Laterality: N/A;  . shoulder surgery Right 08/26/2019  . WISDOM TOOTH EXTRACTION      Family History  Problem Relation Age of Onset  . Hypertension Mother   . Colon polyps Mother        might be mistakrn  . Diabetes Father        type II  . Migraines Sister   . Colon cancer Neg Hx   . Esophageal cancer Neg Hx   . Rectal  cancer Neg Hx   . Stomach cancer Neg Hx     Social History   Socioeconomic History  . Marital status: Single    Spouse name: Not on file  . Number of children: 0  . Years of education: College  . Highest education level: Not on file  Occupational History  . Occupation: Sales executive  Tobacco Use  . Smoking status: Never Smoker  . Smokeless tobacco: Never Used  Vaping Use  . Vaping Use: Never used  Substance and Sexual Activity  . Alcohol use: Not Currently  . Drug use: Yes    Types: Marijuana    Comment: rare use  . Sexual activity: Yes    Partners: Female  Other Topics Concern  . Not on file  Social History Narrative   Lives alone   Caffeine use: 1 cup coffee per day   Right handed    Social Determinants of Health   Financial Resource Strain:   . Difficulty of Paying Living Expenses: Not on file  Food Insecurity:   . Worried About Charity fundraiser in the Last Year: Not on file  . Ran Out of Food in the Last Year: Not on file  Transportation Needs:   . Lack of Transportation (Medical): Not on file  .  Lack of Transportation (Non-Medical): Not on file  Physical Activity:   . Days of Exercise per Week: Not on file  . Minutes of Exercise per Session: Not on file  Stress:   . Feeling of Stress : Not on file  Social Connections:   . Frequency of Communication with Friends and Family: Not on file  . Frequency of Social Gatherings with Friends and Family: Not on file  . Attends Religious Services: Not on file  . Active Member of Clubs or Organizations: Not on file  . Attends Archivist Meetings: Not on file  . Marital Status: Not on file  Intimate Partner Violence:   . Fear of Current or Ex-Partner: Not on file  . Emotionally Abused: Not on file  . Physically Abused: Not on file  . Sexually Abused: Not on file    Outpatient Medications Prior to Visit  Medication Sig Dispense Refill  . albendazole (ALBENZA) 200 MG tablet Take 2 tablets (400 mg total)  by mouth as directed. Take 400 mg x 1, then take second dose 400 mg two weeks later 4 tablet 0  . azelastine (ASTELIN) 0.1 % nasal spray Place 1-2 sprays in each nostril two times a day as needed for runny nose 30 mL 5  . citalopram (CELEXA) 20 MG tablet Take by mouth.    . desonide (DESOWEN) 0.05 % ointment 1 application 2 times daily as needed to effected areas on the face. 15 g 1  . EPINEPHrine (AUVI-Q) 0.3 mg/0.3 mL IJ SOAJ injection Inject 0.3 mLs (0.3 mg total) into the muscle as needed for anaphylaxis. 2 each 1  . famotidine (PEPCID) 20 MG tablet Take 1 tablet (20 mg total) by mouth 2 (two) times daily. 60 tablet 5  . fexofenadine (ALLEGRA) 180 MG tablet Take 180 mg by mouth daily.    . montelukast (SINGULAIR) 10 MG tablet Take 1 tablet (10 mg total) by mouth at bedtime. 31 tablet 5  . pantoprazole (PROTONIX) 40 MG tablet Take 1 tablet (40 mg total) by mouth daily. 30 tablet 3  . Rimegepant Sulfate (NURTEC PO) Take by mouth.    . triamcinolone cream (KENALOG) 0.1 % SMARTSIG:Topical Twice a Week PRN    . triamcinolone ointment (KENALOG) 0.1 % Apply topically.    . Vitamin D, Ergocalciferol, (DRISDOL) 1.25 MG (50000 UNIT) CAPS capsule Take 50,000 Units by mouth once a week.      No facility-administered medications prior to visit.    Allergies  Allergen Reactions  . Shellfish Allergy Anaphylaxis  . Hydrocodone Itching    Pt states he feels itchy after taking, but it doesn't preclude him taking hydrocodone if needed About 2-3 years ago.  It was the first time he took hydrocodone.  . Other Diarrhea    Not anaphylaxis, but GI sensitivity.    ROS Review of Systems  Constitutional: Negative for appetite change, chills and fever.  Respiratory: Negative for cough and shortness of breath.   Cardiovascular: Negative for chest pain.  Gastrointestinal: Positive for abdominal pain. Negative for abdominal distention, blood in stool, nausea and vomiting.  Genitourinary: Negative for dysuria.    Musculoskeletal: Negative for back pain.  Skin: Negative for rash.      Objective:    Physical Exam Vitals reviewed.  Cardiovascular:     Rate and Rhythm: Normal rate and regular rhythm.  Pulmonary:     Effort: Pulmonary effort is normal.     Breath sounds: Normal breath sounds.  Abdominal:  General: Bowel sounds are normal. There is no distension.     Palpations: Abdomen is soft. There is no mass.     Comments: Very minimally tender right upper quadrant.  No guarding.  No hepatomegaly noted.  Neurological:     Mental Status: He is alert.     BP 118/80 (BP Location: Left Arm, Patient Position: Sitting, Cuff Size: Large)   Pulse 68   Temp 98.9 F (37.2 C) (Oral)   Wt (!) 347 lb 6.4 oz (157.6 kg)   SpO2 98%   BMI 40.15 kg/m  Wt Readings from Last 3 Encounters:  06/16/20 (!) 347 lb 6.4 oz (157.6 kg)  06/15/20 (!) 341 lb (154.7 kg)  05/20/20 (!) 343 lb 9.6 oz (155.9 kg)     Health Maintenance Due  Topic Date Due  . Hepatitis C Screening  Never done  . INFLUENZA VACCINE  04/25/2020    There are no preventive care reminders to display for this patient.  Lab Results  Component Value Date   TSH 1.120 05/20/2020   Lab Results  Component Value Date   WBC 7.4 05/20/2020   HGB 14.8 05/20/2020   HCT 44.1 05/20/2020   MCV 90 05/20/2020   PLT 193 05/20/2020   Lab Results  Component Value Date   NA 140 05/20/2020   K 4.6 05/20/2020   CO2 25 05/20/2020   GLUCOSE 88 05/20/2020   BUN 12 05/20/2020   CREATININE 0.75 (L) 05/20/2020   BILITOT 0.4 05/20/2020   ALKPHOS 72 05/20/2020   AST 13 05/20/2020   ALT 21 05/20/2020   PROT 6.7 05/20/2020   ALBUMIN 4.6 05/20/2020   CALCIUM 9.1 05/20/2020   ANIONGAP 11 10/16/2019   GFR 133.48 01/21/2020   Lab Results  Component Value Date   CHOL 132 01/21/2020   Lab Results  Component Value Date   HDL 34.00 (L) 01/21/2020   Lab Results  Component Value Date   LDLCALC 82 01/21/2020   Lab Results  Component  Value Date   TRIG 79.0 01/21/2020   Lab Results  Component Value Date   CHOLHDL 4 01/21/2020   Lab Results  Component Value Date   HGBA1C 5.0 01/21/2020      Assessment & Plan:   Right upper quadrant abdominal pain.  Pain is worse with movement and suspect this may be more musculoskeletal.  He does not have any history of known gallstones by recent imaging last January.  Also does not describe any associated nausea or postprandial type symptoms, so doubt gallstones.  Doubt acalculous cholecystitis.  Recent hepatic panel was normal.  Recent labs reviewed.  -We recommend observation for now.  Follow-up immediately for any fever, progressive pain or any new symptoms such as nausea/vomiting -consider OTC analgesics as needed.   No orders of the defined types were placed in this encounter.   Follow-up: No follow-ups on file.    Carolann Littler, MD

## 2020-06-16 NOTE — Telephone Encounter (Signed)
FYI

## 2020-06-16 NOTE — Telephone Encounter (Signed)
Initial Comment Caller states the patient is having abdominal pain. Caller states that it is on the right side under the right breast bone. Caller states that it is worse when coughing or moving. Caller states that it is not severe. Not chest pain. Translation No Nurse Assessment Nurse: Thad Ranger, RN, Denise Date/Time (Eastern Time): 06/16/2020 8:29:12 AM Confirm and document reason for call. If symptomatic, describe symptoms. ---Pt w/right sided abd pain. Has the patient had close contact with a person known or suspected to have the novel coronavirus illness OR traveled / lives in area with major community spread (including international travel) in the last 14 days from the onset of symptoms? * If Asymptomatic, screen for exposure and travel within the last 14 days. ---No Does the patient have any new or worsening symptoms? ---Yes Will a triage be completed? ---Yes Related visit to physician within the last 2 weeks? ---No Does the PT have any chronic conditions? (i.e. diabetes, asthma, this includes High risk factors for pregnancy, etc.) ---No Is this a behavioral health or substance abuse call? ---No Guidelines Guideline Title Affirmed Question Affirmed Notes Nurse Date/Time (Eastern Time) Abdominal Pain - Male [1] MILD-MODERATE pain AND [2] constant AND [3] present > 2 hours [name], RN, Langley Gauss 06/16/2020 8:29:54 AMPLEASE NOTE: All timestamps contained within this report are represented as [place] Standard Time. CONFIDENTIALTY NOTICE: This fax transmission is intended only for the addressee. It contains information that is legally privileged, confidential or otherwise protected from use or disclosure. If you are not the intended recipient, you are strictly prohibited from reviewing, disclosing, copying using or disseminating any of this information or taking any action in reliance on or regarding this information. If you have received this fax in error, please notify us immediately by  telephone so that we can arrange for its return to [place]. Phone: [phone], Toll-Free: [phone], Fax: [phone] Page: 2 of 2 Call Id: [id number] [place]. Time Eilene Ghazi Time) Disposition Final User 06/16/2020 8:34:03 AM Call Completed Thad Ranger, RN, Langley Gauss 06/16/2020 8:33:08 AM See HCP within 4 Hours (or PCP triage) Yes [name], RN, [name] Disagree/Comply Comply Caller Understands Yes PreDisposition Call Doctor Care Advice Given Per Guideline SEE HCP (OR PCP TRIAGE) WITHIN 4 HOURS: REST: * Lie down. * Rest until seen. CALL BACK IF: * You become worse CARE ADVICE given per Abdominal Pain, Male (Adult) guideline. Referrals Warm transfer to backlin

## 2020-06-17 ENCOUNTER — Encounter: Payer: Self-pay | Admitting: Allergy

## 2020-06-17 ENCOUNTER — Other Ambulatory Visit: Payer: Self-pay | Admitting: *Deleted

## 2020-06-17 MED ORDER — AUVI-Q 0.3 MG/0.3ML IJ SOAJ: 0.3000 mg | INTRAMUSCULAR | 1 refills | Status: DC | PRN

## 2020-06-18 ENCOUNTER — Emergency Department (INDEPENDENT_AMBULATORY_CARE_PROVIDER_SITE_OTHER)
Admission: EM | Admit: 2020-06-18 | Discharge: 2020-06-18 | Disposition: A | Discharge status: Home / Self Care | Payer: 59 | Source: Home / Self Care

## 2020-06-18 ENCOUNTER — Other Ambulatory Visit: Payer: Self-pay

## 2020-06-18 ENCOUNTER — Encounter: Payer: Self-pay | Admitting: Family Medicine

## 2020-06-18 DIAGNOSIS — M7989 Other specified soft tissue disorders: Secondary | ICD-10-CM

## 2020-06-18 DIAGNOSIS — M25571 Pain in right ankle and joints of right foot: Secondary | ICD-10-CM

## 2020-06-18 DIAGNOSIS — M79661 Pain in right lower leg: Secondary | ICD-10-CM

## 2020-06-18 NOTE — ED Provider Notes (Signed)
[name]    CSN: [id number] Arrival date & time: 06/18/20  1435      History   Chief Complaint Chief Complaint  Patient presents with  . Leg Swelling    Right    HPI Bradley [name] is a 24 y.o. male.   HPI  Bradley [name] is a 24 y.o. male presenting to UC with c/o Right lower leg pain just above his ankle that started about 3 days ago. He was tx recently by podiatry and sports medicine for a Right ankle fracture. He was in a walking boot but removed it about 1 week ago.  No new injury.  Pain is aching and sore at times. Denies bruising, swelling or redness.  Pt concerned for a blood clot.   Past Medical History:  Diagnosis Date  . Anxiety   . Chronic headaches   . Clavicular fracture   . History of chickenpox   . Mild intermittent asthma 06/07/2018  . Shingles    24 years old.  Marland Kitchen Urticaria 05/18/2020    Patient Active Problem List   Diagnosis Date Noted  . Gastroesophageal reflux disease 05/19/2020  . Urticaria 05/18/2020  . Seasonal and perennial allergic rhinoconjunctivitis 04/19/2020  . Mild persistent asthma, uncomplicated 62/95/2841  . Low back pain 12/18/2019  . Shortness of breath 10/15/2019  . 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 Hamstring injury, right, initial encounter 01/23/2019  . Migraine without aura and without status migrainosus, not intractable 07/10/2018  . Morbid obesity with BMI of 40.0-44.9, adult ([place]) 07/10/2018  . Mild intermittent asthma without complication 32/44/0102  . Atypical chest pain 12/21/2017  . Generalized abdominal pain 12/17/2017  . Anxiety 11/28/2017    Past Surgical History:  Procedure Laterality Date  . CYSTECTOMY  01/2019   scrotum area   . ESOPHAGEAL MANOMETRY N/A 06/09/2020   Procedure: ESOPHAGEAL MANOMETRY (EM);  Surgeon: Lavena Bullion, DO;  Location: [facility];  Service: Gastroenterology;  Laterality: N/A;  . shoulder surgery Right 08/26/2019  . WISDOM TOOTH  EXTRACTION         Home Medications    Prior to Admission medications   Medication Sig Start Date End Date Taking? Authorizing Provider  albendazole (ALBENZA) 200 MG tablet Take 2 tablets (400 mg total) by mouth as directed. Take 400 mg x 1, then take second dose 400 mg two weeks later 04/08/20   Cirigliano, Vito V, DO  AUVI-Q 0.3 MG/0.3ML SOAJ injection Inject 0.3 mg into the muscle as needed for anaphylaxis. 06/17/20   Garnet Sierras, DO  azelastine (ASTELIN) 0.1 % nasal spray Place 1-2 sprays in each nostril two times a day as needed for runny nose 05/07/20   Althea Charon, FNP  citalopram (CELEXA) 20 MG tablet Take by mouth. 04/02/20   [provider]  desonide (DESOWEN) 0.05 % ointment 1 application 2 times daily as needed to effected areas on the face. 04/30/20   Dara Hoyer, FNP  famotidine (PEPCID) 20 MG tablet Take 1 tablet (20 mg total) by mouth 2 (two) times daily. 04/29/20   Dara Hoyer, FNP  fexofenadine (ALLEGRA) 180 MG tablet Take 180 mg by mouth daily.    [provider]  montelukast (SINGULAIR) 10 MG tablet Take 1 tablet (10 mg total) by mouth at bedtime. 04/29/20   Dara Hoyer, FNP  pantoprazole (PROTONIX) 40 MG tablet Take 1 tablet (40 mg total) by mouth daily. 04/05/20   Brunetta Jeans, PA-C  Rimegepant Sulfate (NURTEC PO) Take by mouth.    [provider]  triamcinolone cream (KENALOG) 0.1 % SMARTSIG:Topical Twice a Week PRN 04/16/20   [provider]  triamcinolone ointment (KENALOG) 0.1 % Apply topically. 05/11/20   [provider]  Vitamin D, Ergocalciferol, (DRISDOL) 1.25 MG (50000 UNIT) CAPS capsule Take 50,000 Units by mouth once a week.  03/22/20   [provider]    Family History Family History  Problem Relation Age of Onset  . Hypertension Mother   . Colon polyps Mother        might be mistakrn  . Diabetes Father        type II  . Migraines Sister   . Colon cancer Neg Hx   . Esophageal cancer Neg Hx   .  Rectal cancer Neg Hx   . Stomach cancer Neg Hx     Social History Social History   Tobacco Use  . Smoking status: Never Smoker  . Smokeless tobacco: Never Used  Vaping Use  . Vaping Use: Never used  Substance Use Topics  . Alcohol use: Not Currently  . Drug use: Yes    Types: Marijuana    Comment: rare use     Allergies   Shellfish allergy, Hydrocodone, and Other   Review of Systems Review of Systems  Musculoskeletal: Positive for myalgias. Negative for arthralgias and joint swelling.  Skin: Negative for color change and wound.     Physical Exam Triage Vital Signs ED Triage Vitals  Enc Vitals Group     BP 06/18/20 1446 135/82     Pulse Rate 06/18/20 1446 75     Resp 06/18/20 1446 16     Temp 06/18/20 1446 98.5 F (36.9 C)     Temp Source 06/18/20 1446 Oral     SpO2 06/18/20 1446 98 %     Weight --      Height --      Head Circumference --      Peak Flow --      Pain Score 06/18/20 1444 1     Pain Loc --      Pain Edu? --      Excl. in [place]? --    No data found.  Updated Vital Signs BP 135/82 (BP Location: Right Arm)   Pulse 75   Temp 98.5 F (36.9 C) (Oral)   Resp 16   SpO2 98%   Visual Acuity Right Eye Distance:   Left Eye Distance:   Bilateral Distance:    Right Eye Near:   Left Eye Near:    Bilateral Near:     Physical Exam Vitals and nursing note reviewed.  Constitutional:      Appearance: Normal appearance. He is well-developed.  HENT:     Head: Normocephalic and atraumatic.  Cardiovascular:     Rate and Rhythm: Normal rate.  Pulmonary:     Effort: Pulmonary effort is normal.  Musculoskeletal:        General: Tenderness present. No swelling. Normal range of motion.     Cervical back: Normal range of motion.       Legs:     Comments: Right lower leg: mild muscular tenderness to anterior lateral tibia. Calf is soft, non-tender. Full ROM ankle.   Skin:    General: Skin is warm and dry.     Findings: No bruising or erythema.    Neurological:     Mental Status: He is alert and oriented to person, place,  and time.  Psychiatric:        Behavior: Behavior normal.      UC Treatments / Results  Labs (all labs ordered are listed, but only abnormal results are displayed) Labs Reviewed - No data to display  EKG   Radiology No results found.  Procedures Procedures (including critical care time)  Medications Ordered in UC Medications - No data to display  Initial Impression / Assessment and Plan / UC Course  I have reviewed the triage vital signs and the nursing notes.  Pertinent labs & imaging results that were available during my care of the patient were reviewed by me and considered in my medical decision making (see chart for details).     Anterior muscular tenderness noted on exam. No calf swelling or tenderness. Skin in tact. No ecchymosis, erythema or warmth.  Low concern for DVT at this time. Encouraged alternating cool and warm compresses, gentle stretching  Encouraged f/u with Dr. Georgina Snell, Sports Medicine, who pt has been seeing for his recent ankle fracture. Pt verbalized understanding and agreement with tx plan.  Final Clinical Impressions(s) / UC Diagnoses   Final diagnoses:  Pain in right lower leg     Discharge Instructions      You may use the ace wrap to apply gradual pressure on your lower leg to help with muscle discomfort. You should also apply a cool compress 2-3 times daily for 10-15 minutes at a time, and try gentle massaging of your shin muscles and stretching of your ankle to help with pain.  Call to schedule a follow up appointment with Dr. Georgina Snell in 1-2 weeks if not improving, sooner if you develop worsening pain, swelling, redness, warmth, or pain in the back of your leg.     ED Prescriptions    None     PDMP not reviewed this encounter.   Noe Gens, PA-C 06/19/20 2200

## 2020-06-18 NOTE — ED Triage Notes (Signed)
Patient presents to Urgent Care with complaints of right lower leg swelling, lateral since about 3 days ago. Patient reports he recently fractured his ankle and is not sure if the swelling is related. Ambulatory w/ steady gait.

## 2020-06-18 NOTE — Discharge Instructions (Signed)
  You may use the ace wrap to apply gradual pressure on your lower leg to help with muscle discomfort. You should also apply a cool compress 2-3 times daily for 10-15 minutes at a time, and try gentle massaging of your shin muscles and stretching of your ankle to help with pain.  Call to schedule a follow up appointment with Dr. Georgina Snell in 1-2 weeks if not improving, sooner if you develop worsening pain, swelling, redness, warmth, or pain in the back of your leg.

## 2020-06-21 ENCOUNTER — Other Ambulatory Visit: Payer: Self-pay

## 2020-06-21 ENCOUNTER — Ambulatory Visit (HOSPITAL_BASED_OUTPATIENT_CLINIC_OR_DEPARTMENT_OTHER)
Admission: RE | Admit: 2020-06-21 | Discharge: 2020-06-21 | Disposition: A | Discharge status: Home / Self Care | Payer: 59 | Source: Ambulatory Visit | Attending: Family Medicine | Admitting: Family Medicine

## 2020-06-21 DIAGNOSIS — M7989 Other specified soft tissue disorders: Secondary | ICD-10-CM

## 2020-06-21 DIAGNOSIS — M25571 Pain in right ankle and joints of right foot: Secondary | ICD-10-CM

## 2020-06-21 NOTE — Telephone Encounter (Signed)
Patient called back following up on the request for a possible ultrasound.

## 2020-06-22 ENCOUNTER — Ambulatory Visit (HOSPITAL_BASED_OUTPATIENT_CLINIC_OR_DEPARTMENT_OTHER): Payer: 59

## 2020-06-22 NOTE — Progress Notes (Signed)
No DVT seen on ultrasound

## 2020-06-24 DIAGNOSIS — R1310 Dysphagia, unspecified: Secondary | ICD-10-CM

## 2020-06-30 ENCOUNTER — Other Ambulatory Visit: Payer: Self-pay

## 2020-06-30 ENCOUNTER — Ambulatory Visit: Payer: 59

## 2020-06-30 ENCOUNTER — Telehealth (INDEPENDENT_AMBULATORY_CARE_PROVIDER_SITE_OTHER): Payer: 59 | Admitting: Physician Assistant

## 2020-06-30 ENCOUNTER — Encounter: Payer: Self-pay | Admitting: Physician Assistant

## 2020-06-30 DIAGNOSIS — R1011 Right upper quadrant pain: Secondary | ICD-10-CM

## 2020-06-30 LAB — COMPREHENSIVE METABOLIC PANEL
ALT: 36 U/L (ref 0–53)
AST: 25 U/L (ref 0–37)
Albumin: 4.3 g/dL (ref 3.5–5.2)
Alkaline Phosphatase: 62 U/L (ref 39–117)
BUN: 13 mg/dL (ref 6–23)
CO2: 27 mEq/L (ref 19–32)
Calcium: 8.9 mg/dL (ref 8.4–10.5)
Chloride: 103 mEq/L (ref 96–112)
Creatinine, Ser: 0.75 mg/dL (ref 0.40–1.50)
GFR: 127.49 mL/min (ref >60.00)
Glucose, Bld: 88 mg/dL (ref 70–99)
Potassium: 3.9 mEq/L (ref 3.5–5.1)
Sodium: 137 mEq/L (ref 135–145)
Total Bilirubin: 0.6 mg/dL (ref 0.2–1.2)
Total Protein: 6.6 g/dL (ref 6.0–8.3)

## 2020-06-30 LAB — CBC WITH DIFFERENTIAL/PLATELET
Basophils Absolute: 0 10*3/uL (ref 0.0–0.1)
Basophils Relative: 0.6 % (ref 0.0–3.0)
Eosinophils Absolute: 0.1 10*3/uL (ref 0.0–0.7)
Eosinophils Relative: 1.8 % (ref 0.0–5.0)
HCT: 42.8 % (ref 39.0–52.0)
Hemoglobin: 14.5 g/dL (ref 13.0–17.0)
Lymphocytes Relative: 24.2 % (ref 12.0–46.0)
Lymphs Abs: 1.6 10*3/uL (ref 0.7–4.0)
MCHC: 33.8 g/dL (ref 30.0–36.0)
MCV: 90.8 fl (ref 78.0–100.0)
Monocytes Absolute: 0.3 10*3/uL (ref 0.1–1.0)
Monocytes Relative: 4.3 % (ref 3.0–12.0)
Neutro Abs: 4.7 10*3/uL (ref 1.4–7.7)
Neutrophils Relative %: 69.1 % (ref 43.0–77.0)
Platelets: 194 10*3/uL (ref 150.0–400.0)
RBC: 4.71 Mil/uL (ref 4.22–5.81)
RDW: 13.3 % (ref 11.5–15.5)
WBC: 6.8 10*3/uL (ref 4.0–10.5)

## 2020-06-30 NOTE — Addendum Note (Signed)
Addended by: Fritz Pickerel on: 06/30/2020 01:32 PM   Modules accepted: Orders

## 2020-06-30 NOTE — Progress Notes (Signed)
I have discussed the procedure for the virtual visit with the patient who has given consent to proceed with assessment and treatment.   Yussuf Sawyers L Marshe Shrestha, CMA     

## 2020-06-30 NOTE — Progress Notes (Signed)
Virtual Visit via Video   I connected with patient on 06/30/20 at  9:00 AM EDT by a video enabled telemedicine application and verified that I am speaking with the correct person using two identifiers.  Location patient: Home Location provider: [facility], Office Persons participating in the virtual visit: Patient, Provider, [place] (Patina Moore)  I discussed the limitations of evaluation and management by telemedicine and the availability of in person appointments. The patient expressed understanding and agreed to proceed.  Subjective:   HPI:   Patient presents via [facility] today c/o 2.5 weeks of pain in the right side of his abdomen. Patient notes symptoms for the most part have been colicky and waxing and waning in severity. Notes initially felt was related to musculoskeletal cause as it seemed worse with range of motion. Was evaluated 2 weeks ago by another provider at which time conservative measures were initiated for potential musculoskeletal cause. States he is followed instructions given but with no change in symptoms. Notes now sometimes have severe pain after heavier meal. Denies any nausea or vomiting. Denies change to bowel habits or urinary habits. Denies fever or chills. Has history of GERD for which he takes Protonix 40 mg daily. Notes this is been well controlled overall and has switch back to famotidine 40 mg nightly.   ROS:   See pertinent positives and negatives per HPI.  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   Hamstring injury, right, initial encounter 01/23/2019   Migraine without aura and without status  migrainosus, not intractable 07/10/2018   Morbid obesity with BMI of 40.0-44.9, adult ([place]) 07/10/2018   Mild intermittent asthma without complication 04/88/8916   Atypical chest pain 12/21/2017   Generalized abdominal pain 12/17/2017   Anxiety 11/28/2017    Social History   Tobacco Use   Smoking status: Never Smoker   Smokeless tobacco: Never Used  Substance Use Topics   Alcohol use: Not Currently    Current Outpatient Medications:    AUVI-Q 0.3 MG/0.3ML SOAJ injection, Inject 0.3 mg into the muscle as needed for anaphylaxis., Disp: 2 each, Rfl: 1   citalopram (CELEXA) 20 MG tablet, Take by mouth., Disp: , Rfl:    montelukast (SINGULAIR) 10 MG tablet, Take 1 tablet (10 mg total) by mouth at bedtime., Disp: 31 tablet, Rfl: 5   albendazole (ALBENZA) 200 MG tablet, Take 2 tablets (400 mg total) by mouth as directed. Take 400 mg x 1, then take second dose 400 mg two weeks later (Patient not taking: Reported on 06/30/2020), Disp: 4 tablet, Rfl: 0   azelastine (ASTELIN) 0.1 % nasal spray, Place 1-2 sprays in each nostril two times a day as needed for runny nose (Patient not taking: Reported on 06/30/2020), Disp: 30 mL, Rfl: 5   desonide (DESOWEN) 0.05 % ointment, 1 application 2 times daily as needed to effected areas on the face. (Patient not taking: Reported on 06/30/2020), Disp: 15 g, Rfl: 1   famotidine (PEPCID) 20 MG tablet, Take 1 tablet (20 mg total) by mouth 2 (two) times daily. (Patient not taking: Reported on 06/30/2020), Disp: 60 tablet, Rfl: 5   fexofenadine (ALLEGRA) 180 MG tablet, Take 180 mg by mouth daily. (Patient  not taking: Reported on 06/30/2020), Disp: , Rfl:    pantoprazole (PROTONIX) 40 MG tablet, Take 1 tablet (40 mg total) by mouth daily. (Patient not taking: Reported on 06/30/2020), Disp: 30 tablet, Rfl: 3   Rimegepant Sulfate (NURTEC PO), Take by mouth. (Patient not taking: Reported on 06/30/2020), Disp: , Rfl:    triamcinolone cream (KENALOG) 0.1 %,  SMARTSIG:Topical Twice a Week PRN (Patient not taking: Reported on 06/30/2020), Disp: , Rfl:    triamcinolone ointment (KENALOG) 0.1 %, Apply topically. (Patient not taking: Reported on 06/30/2020), Disp: , Rfl:    Vitamin D, Ergocalciferol, (DRISDOL) 1.25 MG (50000 UNIT) CAPS capsule, Take 50,000 Units by mouth once a week.  (Patient not taking: Reported on 06/30/2020), Disp: , Rfl:   Allergies  Allergen Reactions   Shellfish Allergy Anaphylaxis   Hydrocodone Itching    Pt states he feels itchy after taking, but it doesn't preclude him taking hydrocodone if needed About 2-3 years ago.  It was the first time he took hydrocodone.   Other Diarrhea    Slightly allergic to red meat since tick bite.Marland KitchenMarland KitchenNot anaphylaxis, but GI sensitivity.    Objective:   There were no vitals taken for this visit.  Patient is well-developed, well-nourished in no acute distress.  Resting comfortably at home.  Head is normocephalic, atraumatic.  No labored breathing.  Speech is clear and coherent with logical content.  Patient is alert and oriented at baseline.   Assessment and Plan:   1. Colicky RUQ abdominal pain Ongoing for 2+ weeks. Was easing up but worsened over the weekend with severe colicky right upper quadrant pain. Now with some postprandial symptom change. Giving lack of response to conservative measures initiated by previous provider, weight and current symptoms, will proceed with further evaluation. Patient to come in for labs to include CBC and CMP. We will proceed with ultrasound of abdomen to assess gallbladder. Discussed with patient he may have to be consideration for CT given body habitus but want to avoid this if at all possible due to radiation and multiple prior imaging. Strict ER precautions reviewed. - CBC w/Diff; Future - Comp Met (CMET); Future - US Abdomen Limited RUQ; Future .   Leeanne Rio, PA-C 06/30/2020

## 2020-07-02 ENCOUNTER — Ambulatory Visit (HOSPITAL_BASED_OUTPATIENT_CLINIC_OR_DEPARTMENT_OTHER)
Admission: RE | Admit: 2020-07-02 | Discharge: 2020-07-02 | Disposition: A | Discharge status: Home / Self Care | Payer: 59 | Source: Ambulatory Visit | Attending: Physician Assistant | Admitting: Physician Assistant

## 2020-07-02 ENCOUNTER — Other Ambulatory Visit: Payer: Self-pay

## 2020-07-02 DIAGNOSIS — R1011 Right upper quadrant pain: Secondary | ICD-10-CM

## 2020-07-06 ENCOUNTER — Encounter: Payer: Self-pay | Admitting: Family Medicine

## 2020-07-06 ENCOUNTER — Ambulatory Visit (INDEPENDENT_AMBULATORY_CARE_PROVIDER_SITE_OTHER): Payer: 59 | Admitting: Family Medicine

## 2020-07-06 ENCOUNTER — Other Ambulatory Visit: Payer: Self-pay

## 2020-07-06 DIAGNOSIS — M25571 Pain in right ankle and joints of right foot: Secondary | ICD-10-CM | POA: Diagnosis not present

## 2020-07-06 DIAGNOSIS — G8929 Other chronic pain: Secondary | ICD-10-CM | POA: Diagnosis not present

## 2020-07-06 NOTE — Assessment & Plan Note (Signed)
Patient's right ankle pain is significantly better at this time.  Patient does have an area that could be secondary to a mild fascia injury around the peroneal tendon muscular juncture.  We discussed the potential of lipoma also being within the differential.  We discussed that we could do imaging such as an MRI if he would like further evaluation but likely would not change management.  Patient states he is electing to just monitor at this time and can follow-up as needed.  We discussed that if it did grow I would like patient to see patient again or we can ultrasound and then likely advance imaging.  Patient is in agreement with the plan

## 2020-07-06 NOTE — Progress Notes (Signed)
[facility] [address] [place] Phone: [phone] Subjective:   I Bradley [name] am serving as a Education administrator for Dr. Hulan Saas.  This visit occurred during the SARS-CoV-2 public health emergency.  Safety protocols were in place, including screening questions prior to the visit, additional usage of staff PPE, and extensive cleaning of exam room while observing appropriate contact time as indicated for disinfecting solutions.   I'm seeing this patient by the request  of:  Delorse Limber  CC: Right leg pain follow-up  [id number]   04/27/2020 Continues to have some intermittent pain with this.  This could be a reoccurrence of the exacerbation.  Discussed which activities to doing which wants to avoid.  Follow-up with me if any worsening pain but likely can do well with compression and home exercises which were given.   Patient is making significant strides and has lost a great amount of weight.  Encouraged him to continue to do so I do believe that this will be the best for his musculoskeletal complaints.  Update 07/06/2020 Bradley [name] is a 24 y.o. male coming in with complaint of low back pain. Patient states today he is following up on his leg and ankle. States it is much better today.   Patient states that he feels like he is getting back to his baseline.  Some very mild discomfort.  Patient was seen by another provider and had an ultrasound at that time that did not show any significant abnormality.  Patient was sent for a Doppler that ruled out any type of DVT.      Past Medical History:  Diagnosis Date  . Anxiety   . Chronic headaches   . Clavicular fracture   . History of chickenpox   . Mild intermittent asthma 06/07/2018  . Shingles    24 years old.  Marland Kitchen Urticaria 05/18/2020   Past Surgical History:  Procedure Laterality Date  . CYSTECTOMY  01/2019   scrotum area   . ESOPHAGEAL MANOMETRY N/A 06/09/2020    Procedure: ESOPHAGEAL MANOMETRY (EM);  Surgeon: Lavena Bullion, DO;  Location: [facility];  Service: Gastroenterology;  Laterality: N/A;  . shoulder surgery Right 08/26/2019  . WISDOM TOOTH EXTRACTION     Social History   Socioeconomic History  . Marital status: Single    Spouse name: Not on file  . Number of children: 0  . Years of education: College  . Highest education level: Not on file  Occupational History  . Occupation: Sales executive  Tobacco Use  . Smoking status: Never Smoker  . Smokeless tobacco: Never Used  Vaping Use  . Vaping Use: Never used  Substance and Sexual Activity  . Alcohol use: Not Currently  . Drug use: Yes    Types: Marijuana    Comment: rare use  . Sexual activity: Yes    Partners: Female  Other Topics Concern  . Not on file  Social History Narrative   Lives alone   Caffeine use: 1 cup coffee per day   Right handed    Social Determinants of Health   Financial Resource Strain:   . Difficulty of Paying Living Expenses: Not on file  Food Insecurity:   . Worried About Charity fundraiser in the Last Year: Not on file  . Ran Out of Food in the Last Year: Not on file  Transportation Needs:   . Lack of Transportation (Medical): Not on file  . Lack of  Transportation (Non-Medical): Not on file  Physical Activity:   . Days of Exercise per Week: Not on file  . Minutes of Exercise per Session: Not on file  Stress:   . Feeling of Stress : Not on file  Social Connections:   . Frequency of Communication with Friends and Family: Not on file  . Frequency of Social Gatherings with Friends and Family: Not on file  . Attends Religious Services: Not on file  . Active Member of Clubs or Organizations: Not on file  . Attends Archivist Meetings: Not on file  . Marital Status: Not on file   Allergies  Allergen Reactions  . Shellfish Allergy Anaphylaxis  . Hydrocodone Itching    Pt states he feels itchy after taking, but it doesn't preclude  him taking hydrocodone if needed About 2-3 years ago.  It was the first time he took hydrocodone.  . Other Diarrhea    Slightly allergic to red meat since tick bite.Marland KitchenMarland KitchenNot anaphylaxis, but GI sensitivity.   Family History  Problem Relation Age of Onset  . Hypertension Mother   . Colon polyps Mother        might be mistakrn  . Diabetes Father        type II  . Migraines Sister   . Colon cancer Neg Hx   . Esophageal cancer Neg Hx   . Rectal cancer Neg Hx   . Stomach cancer Neg Hx      Current Outpatient Medications (Cardiovascular):  Marland Kitchen  AUVI-Q 0.3 MG/0.3ML SOAJ injection, Inject 0.3 mg into the muscle as needed for anaphylaxis.  Current Outpatient Medications (Respiratory):  .  azelastine (ASTELIN) 0.1 % nasal spray, Place 1-2 sprays in each nostril two times a day as needed for runny nose .  fexofenadine (ALLEGRA) 180 MG tablet, Take 180 mg by mouth daily.  .  montelukast (SINGULAIR) 10 MG tablet, Take 1 tablet (10 mg total) by mouth at bedtime.  Current Outpatient Medications (Analgesics):  Marland Kitchen  Rimegepant Sulfate (NURTEC PO), Take by mouth.    Current Outpatient Medications (Other):  .  citalopram (CELEXA) 20 MG tablet, Take by mouth. .  famotidine (PEPCID) 20 MG tablet, Take 1 tablet (20 mg total) by mouth 2 (two) times daily. Marland Kitchen  triamcinolone cream (KENALOG) 0.1 %, SMARTSIG:Topical Twice a Week PRN .  triamcinolone ointment (KENALOG) 0.1 %, Apply topically.  .  Vitamin D, Ergocalciferol, (DRISDOL) 1.25 MG (50000 UNIT) CAPS capsule, Take 50,000 Units by mouth once a week.    Reviewed prior external information including notes and imaging from  primary care provider As well as notes that were available from care everywhere and other healthcare systems.  Past medical history, social, surgical and family history all reviewed in electronic medical record.  No pertanent information unless stated regarding to the chief complaint.   Review of Systems:  No headache, visual  changes, nausea, vomiting, diarrhea, constipation, dizziness, abdominal pain, skin rash, fevers, chills, night sweats, weight loss, swollen lymph nodes, body aches, joint swelling, chest pain, shortness of breath, mood changes. POSITIVE muscle aches  Objective  Blood pressure 140/90, pulse 72, height 6\' 6"  (1.981 m), weight (!) 352 lb (159.7 kg), SpO2 97 %.   General: No apparent distress alert and oriented x3 mood and affect normal, dressed appropriately.  HEENT: Pupils equal, extraocular movements intact  Respiratory: Patient's speak in full sentences and does not appear short of breath  Cardiovascular: No lower extremity edema, non tender, no erythema  Neuro: Cranial  nerves II through XII are intact, neurovascularly intact in all extremities with 2+ DTRs and 2+ pulses.  Gait normal with good balance and coordination.  MSK:   Right ankle exam shows the patient still has some very mild outpouching of the peroneal tendon noted approximately 4 cm proximal to the lateral malleolus.  He does seem to have some fullness in this area.  Patient is very minimally tender.  Otherwise ankle exam is unremarkable, negative Thompson.  No pain in the popliteal area.  Neurovascularly intact distally.    Impression and Recommendations:     The above documentation has been reviewed and is accurate and complete Lyndal Pulley, DO

## 2020-07-07 ENCOUNTER — Encounter: Payer: Self-pay | Admitting: Physician Assistant

## 2020-07-22 ENCOUNTER — Encounter: Payer: Self-pay | Admitting: Allergy

## 2020-07-23 ENCOUNTER — Encounter: Payer: Self-pay | Admitting: Family Medicine

## 2020-07-25 ENCOUNTER — Other Ambulatory Visit: Payer: Self-pay | Admitting: Physician Assistant

## 2020-07-28 ENCOUNTER — Other Ambulatory Visit: Payer: Self-pay

## 2020-07-28 DIAGNOSIS — M79661 Pain in right lower leg: Secondary | ICD-10-CM

## 2020-07-30 ENCOUNTER — Emergency Department (INDEPENDENT_AMBULATORY_CARE_PROVIDER_SITE_OTHER)
Admission: EM | Admit: 2020-07-30 | Discharge: 2020-07-30 | Disposition: A | Discharge status: Home / Self Care | Payer: 59 | Source: Home / Self Care

## 2020-07-30 ENCOUNTER — Other Ambulatory Visit: Payer: Self-pay

## 2020-07-30 DIAGNOSIS — R590 Localized enlarged lymph nodes: Secondary | ICD-10-CM | POA: Diagnosis not present

## 2020-07-30 DIAGNOSIS — S76112A Strain of left quadriceps muscle, fascia and tendon, initial encounter: Secondary | ICD-10-CM

## 2020-07-30 DIAGNOSIS — S76219A Strain of adductor muscle, fascia and tendon of unspecified thigh, initial encounter: Secondary | ICD-10-CM

## 2020-07-30 DIAGNOSIS — S76111A Strain of right quadriceps muscle, fascia and tendon, initial encounter: Secondary | ICD-10-CM

## 2020-07-30 NOTE — ED Triage Notes (Signed)
Pt c/o abscess on r thigh, soreness, redness, no drainage started 3 days ago.

## 2020-07-30 NOTE — Discharge Instructions (Addendum)
  You may take Tylenol or motrin as needed for muscle pain and alternate cool and warm compresses. Follow up with family medicine next week if not improving, or if new symptoms develop.

## 2020-07-30 NOTE — ED Provider Notes (Signed)
[name]    CSN: [id number] Arrival date & time: 07/30/20  1330      History   Chief Complaint Chief Complaint  Patient presents with  . Groin Pain    HPI Bradley [name] is a 24 y.o. male.   HPI  Bradley [name] is a 24 y.o. male presenting to UC with c/o a small tender bump in his Right groin that has been there for about 3 days. Pt concerned it may be an abscess. Pain is minimal. He reports  Working out last week, causing his thigh muscles to be aching and sore. Pt not sure if the groin pain is related. Pain is 2/10. No urinary symptoms. No fever, chills, n/v/d. No abdominal pain.   Past Medical History:  Diagnosis Date  . Anxiety   . Chronic headaches   . Clavicular fracture   . History of chickenpox   . Mild intermittent asthma 06/07/2018  . Shingles    24 years old.  Marland Kitchen Urticaria 05/18/2020    Patient Active Problem List   Diagnosis Date Noted  . Right ankle pain 07/06/2020  . Dysphagia   . Gastroesophageal reflux disease 05/19/2020  . Urticaria 05/18/2020  . Seasonal and perennial allergic rhinoconjunctivitis 04/19/2020  . Mild persistent asthma, uncomplicated 09/38/1829  . Low back pain 12/18/2019  . Shortness of breath 10/15/2019  . 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 Hamstring injury, right, initial encounter 01/23/2019  . Migraine without aura and without status migrainosus, not intractable 07/10/2018  . Morbid obesity with BMI of 40.0-44.9, adult ([place]) 07/10/2018  . Mild intermittent asthma without complication 93/71/6967  . Atypical chest pain 12/21/2017  . Generalized abdominal pain 12/17/2017  . Anxiety 11/28/2017    Past Surgical History:  Procedure Laterality Date  . COLONOSCOPY    . CYSTECTOMY  01/2019   scrotum area   . ESOPHAGEAL MANOMETRY N/A 06/09/2020   Procedure: ESOPHAGEAL MANOMETRY (EM);  Surgeon: Lavena Bullion, DO;  Location: [facility];  Service: Gastroenterology;  Laterality: N/A;  .  ESOPHAGOGASTRODUODENOSCOPY    . shoulder surgery Right 08/26/2019  . WISDOM TOOTH EXTRACTION         Home Medications    Prior to Admission medications   Medication Sig Start Date End Date Taking? Authorizing Provider  AUVI-Q 0.3 MG/0.3ML SOAJ injection Inject 0.3 mg into the muscle as needed for anaphylaxis. 06/17/20   Garnet Sierras, DO  azelastine (ASTELIN) 0.1 % nasal spray Place 1-2 sprays in each nostril two times a day as needed for runny nose 05/07/20   Althea Charon, FNP  citalopram (CELEXA) 20 MG tablet Take 1 tablet (20 mg total) by mouth daily. 07/26/20   Brunetta Jeans, PA-C  famotidine (PEPCID) 20 MG tablet Take 1 tablet (20 mg total) by mouth 2 (two) times daily. 04/29/20   Dara Hoyer, FNP  fexofenadine (ALLEGRA) 180 MG tablet Take 180 mg by mouth daily.     [provider]  montelukast (SINGULAIR) 10 MG tablet Take 1 tablet (10 mg total) by mouth at bedtime. 04/29/20   Dara Hoyer, FNP  Rimegepant Sulfate (NURTEC PO) Take by mouth.     [provider]  triamcinolone cream (KENALOG) 0.1 % SMARTSIG:Topical Twice a Week PRN 04/16/20   [provider]  triamcinolone ointment (KENALOG) 0.1 % Apply topically.  05/11/20   [provider]  Vitamin D, Ergocalciferol, (DRISDOL) 1.25 MG (50000 UNIT) CAPS capsule Take 50,000 Units by mouth  once a week.  03/22/20   [provider]    Family History Family History  Problem Relation Age of Onset  . Hypertension Mother   . Colon polyps Mother        might be mistakrn  . Diabetes Father        type II  . Migraines Sister   . Colon cancer Neg Hx   . Esophageal cancer Neg Hx   . Rectal cancer Neg Hx   . Stomach cancer Neg Hx     Social History Social History   Tobacco Use  . Smoking status: Never Smoker  . Smokeless tobacco: Never Used  Vaping Use  . Vaping Use: Never used  Substance Use Topics  . Alcohol use: Not Currently  . Drug use: Yes    Types: Marijuana    Comment: rare  use     Allergies   Shellfish allergy, Hydrocodone, and Other   Review of Systems Review of Systems  Constitutional: Negative for chills and fever.  Genitourinary: Negative for dysuria, flank pain, frequency, genital sores and hematuria.  Musculoskeletal: Negative for back pain.     Physical Exam Triage Vital Signs ED Triage Vitals  Enc Vitals Group     BP 07/30/20 1344 140/82     Pulse Rate 07/30/20 1344 74     Resp --      Temp 07/30/20 1344 98.7 F (37.1 C)     Temp Source 07/30/20 1344 Oral     SpO2 07/30/20 1344 99 %     Weight --      Height --      Head Circumference --      Peak Flow --      Pain Score 07/30/20 1338 2     Pain Loc --      Pain Edu? --      Excl. in [place]? --    No data found.  Updated Vital Signs BP 140/82 (BP Location: Right Arm)   Pulse 74   Temp 98.7 F (37.1 C) (Oral)   SpO2 99%   Visual Acuity Right Eye Distance:   Left Eye Distance:   Bilateral Distance:    Right Eye Near:   Left Eye Near:    Bilateral Near:     Physical Exam Vitals and nursing note reviewed.  Constitutional:      Appearance: Normal appearance. He is well-developed.  HENT:     Head: Normocephalic and atraumatic.  Cardiovascular:     Rate and Rhythm: Normal rate.  Pulmonary:     Effort: Pulmonary effort is normal.  Abdominal:     General: There is no distension.     Tenderness: There is no abdominal tenderness.    Musculoskeletal:        General: No swelling. Normal range of motion.     Cervical back: Normal range of motion.     Comments: Full ROM lower extremities. Normal gait. No bony tenderness.   Skin:    General: Skin is warm and dry.     Findings: No bruising or erythema.  Neurological:     Mental Status: He is alert and oriented to person, place, and time.  Psychiatric:        Behavior: Behavior normal.      UC Treatments / Results  Labs (all labs ordered are listed, but only abnormal results are displayed) Labs Reviewed - No data  to display  EKG   Radiology No results found.  Procedures Procedures (  including critical care time)  Medications Ordered in UC Medications - No data to display  Initial Impression / Assessment and Plan / UC Course  I have reviewed the triage vital signs and the nursing notes.  Pertinent labs & imaging results that were available during my care of the patient were reviewed by me and considered in my medical decision making (see chart for details).    Groin exam c/w lymphadenopathy No evidence of bacterial infection at this time Muscle pain c/w mild muscle strain Discussed home tx F/u with PCP and Sports Medicine next week as needed AVS given  Final Clinical Impressions(s) / UC Diagnoses   Final diagnoses:  Lymphadenopathy, inguinal  Strain of right quadriceps muscle, initial encounter  Strain of left quadriceps, initial encounter  Strain of adductor muscle of thigh     Discharge Instructions      You may take Tylenol or motrin as needed for muscle pain and alternate cool and warm compresses. Follow up with family medicine next week if not improving, or if new symptoms develop.     ED Prescriptions    None     PDMP not reviewed this encounter.   Noe Gens, [place] 07/30/20 1743

## 2020-08-03 ENCOUNTER — Encounter: Payer: Self-pay | Admitting: Nurse Practitioner

## 2020-08-03 ENCOUNTER — Ambulatory Visit (INDEPENDENT_AMBULATORY_CARE_PROVIDER_SITE_OTHER): Payer: 59 | Admitting: Nurse Practitioner

## 2020-08-03 VITALS — BP 118/66 | HR 76 | Ht 78.0 in | Wt 362.0 lb

## 2020-08-03 DIAGNOSIS — R1011 Right upper quadrant pain: Secondary | ICD-10-CM

## 2020-08-03 DIAGNOSIS — Z86010 Personal history of colonic polyps: Secondary | ICD-10-CM

## 2020-08-03 DIAGNOSIS — R112 Nausea with vomiting, unspecified: Secondary | ICD-10-CM | POA: Diagnosis not present

## 2020-08-03 MED ORDER — HYOSCYAMINE SULFATE 0.125 MG PO TABS: ORAL_TABLET | ORAL | 0 refills | Status: DC

## 2020-08-03 MED ORDER — OMEPRAZOLE 20 MG PO CPDR: 20.0000 mg | DELAYED_RELEASE_CAPSULE | Freq: Two times a day (BID) | ORAL | 2 refills | Status: DC

## 2020-08-03 NOTE — Patient Instructions (Addendum)
We have sent the following medications to your pharmacy for you to pick up at your convenience: Omeprazole, hyoscyamine  We are changing your colonoscopy recall to  June 2023.  You have been scheduled for a HIDA scan at [facility] (1st floor) on [facility] on 08/10/20.Marland Kitchen Please arrive 30 minutes prior to your scheduled appointment at  8:31DV Make certain not to have anything to eat or drink after midnight hours prior to your test. Should this appointment date or time not work well for you, please call radiology scheduling at [phone].  _____________________________________________________________________ hepatobiliary (HIDA) scan is an imaging procedure used to diagnose problems in the liver, gallbladder and bile ducts. In the HIDA scan, a radioactive chemical or tracer is injected into a vein in your arm. The tracer is handled by the liver like bile. Bile is a fluid produced and excreted by your liver that helps your digestive system break down fats in the foods you eat. Bile is stored in your gallbladder and the gallbladder releases the bile when you eat a meal. A special nuclear medicine scanner (gamma camera) tracks the flow of the tracer from your liver into your gallbladder and small intestine.  During your HIDA scan  You'll be asked to change into a hospital gown before your HIDA scan begins. Your health care team will position you on a table, usually on your back. The radioactive tracer is then injected into a vein in your arm.The tracer travels through your bloodstream to your liver, where it's taken up by the bile-producing cells. The radioactive tracer travels with the bile from your liver into your gallbladder and through your bile ducts to your small intestine.You may feel some pressure while the radioactive tracer is injected into your vein. As you lie on the table, a special gamma camera is positioned over your abdomen taking pictures of the tracer as it moves through  your body. The gamma camera takes pictures continually for about an hour. You'll need to keep still during the HIDA scan. This can become uncomfortable, but you may find that you can lessen the discomfort by taking deep breaths and thinking about other things. Tell your health care team if you're uncomfortable. The radiologist will watch on a computer the progress of the radioactive tracer through your body. The HIDA scan may be stopped when the radioactive tracer is seen in the gallbladder and enters your small intestine. This typically takes about an hour. In some cases extra imaging will be performed if original images aren't satisfactory, if morphine is given to help visualize the gallbladder or if the medication CCK is given to look at the contraction of the gallbladder. This test typically takes 2 hours to complete. ________________________________________________________________________   Please follow up with Dr Bryan Lemma in 3-4 months.   I appreciate the opportunity to care for you. Carl Best, CRNP

## 2020-08-03 NOTE — Progress Notes (Addendum)
08/03/2020 Bradley [name] [id number] 28-Jan-1996   Chief Complaint: follow up after esophageal manometry test   History of Present Illness: Bradley [name] is a 24 year old male with a past medical history of anxiety, asthma, chronic headaches, obesity, GERD, IBS and colon polyps.  He is followed by Dr. Bryan Lemma. He was last seen in our office by Tye Savoy NP on 04/01/2020 with complaints of dysphagia and rectal discomfort. He was prescribed Anusol cream for hemorrhoidal irritation and his rectal symptoms improved. 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 He underwent an esophageal manometry 9/15 which was normal. EGD 02/2019 was normal.  He presents today for further follow up. He continues to have infrequent mid chest pain which can last for a few hours on and off or can last the entire day which occurs 2 to 3 days monthly for the past 2 years. His episodes of chest pain often occur spontaneously, sometimes might occur while eating. He reported completing a cardiac evaluation 2 years ago which included a normal EKG and negative  stress test 12/28/2017.  He does not describe food or liquid dysphagia. He has nausea, usually in the mornings. He reports having "morning sickness". He also feels watery mucous secretions in his throat in the morning, he then vomits a few times 2 to 3 days weekly.  He sometimes induces vomiting to expel the watery secretions which reduces his nausea.No hematemesis.  He takes a steamy hot shower which also settles his N/V.  His chest pain episodes might occur if he swallows a larger portion of food at one time. He has intermittent RUQ pain, no current abdominal pain. RUQ sonogram 07/02/2020 was normal. He is followed by ENT and an allergist. He is on Singulair, Allegra, Astelin nasal spray and he utilizes a  saline rinse most days. He previously took Omeprazole 40mg  QD then switched to Famotidine 20mg  QD as he was concerned about long term PPI use. His nausea was better controlled on Omeprazole.  He underwent a colonoscopy 03/11/2019 due to having diarrhea, a 12 cm tubular adenomatous polyp was removed from the cecum and a 80mm tubular adenomatous polyp was removed from the sigmoid colon. No evidence of IBD or microscopic colitis. His BMs are fairly regular at this time. No rectal bleeding. Mother with history of colon polyps. Mother and sister had gallbladder surgery.    CBC Latest Ref Rng & Units 06/30/2020 05/20/2020 01/21/2020  WBC 4.0 - 10.5 K/uL 6.8 7.4 8.1  Hemoglobin 13.0 - 17.0 g/dL 14.5 14.8 14.8  Hematocrit 39 - 52 % 42.8 44.1 43.6  Platelets 150 - 400 K/uL 194.0 193 195.0   CMP Latest Ref Rng & Units 06/30/2020 05/20/2020 03/11/2020  Glucose 70 - 99 mg/dL 88 88 82  BUN 6 - 23 mg/dL 13 12 11   Creatinine 0.40 - 1.50 mg/dL 0.75 0.75(L) 0.73  Sodium 135 - 145 mEq/L 137 140 137  Potassium 3.5 - 5.1 mEq/L 3.9 4.6 4.0  Chloride 96 - 112 mEq/L 103 102 104  CO2 19 - 32 mEq/L 27 25 24   Calcium 8.4 - 10.5 mg/dL 8.9 9.1 9.4  Total Protein 6.0 - 8.3 g/dL 6.6 6.7 7.3  Total Bilirubin 0.2 - 1.2 mg/dL 0.6 0.4 0.6  Alkaline Phos 39 - 117 U/L 62 72 -  AST 0 - 37 U/L 25 13 13  ALT 0 - 53 U/L 36 21 14  TSH 1.12 on 05/20/2020  RUQ sonogram 07/02/2020: Negative right upper quadrant abdominal ultrasound  Esophageal manometry 06/09/2020:  Normal relaxation of the EG junction.   Barium swallow 04/06/2020: 1. Nonspecific esophageal motility disorder with occasional tertiary contractions, highly unusual for a patient this age. 2. Structurally normal esophagus.  EGD 03/11/2019: - Normal esophagus. - Z-line regular, 42 cm from the incisors. - Gastritis. Biopsied. - Normal cardia, gastric fundus, gastric body and incisura. Biopsied. - Normal duodenal bulb, first portion of the duodenum and second portion of  the duodenum. Biopsied.  Colonoscopy 03/11/2019: - One 12 mm polyp in the cecum, removed with a cold snare. Resected and retrieved. Biopsied. - One 4 mm polyp in the sigmoid colon, removed with a cold snare. Resected and retrieved. - The rectum, descending colon, transverse colon, hepatic flexure and ascending colon are normal. Biopsied. - The distal rectum and anal verge are normal on retroflexion view. - The examined portion of the ileum was normal. Biopsies: 1. Surgical [P], duodenal - BENIGN DUODENAL MUCOSA - NO ACUTE INFLAMMATION, VILLOUS BLUNTING OR INCREASED INTRAEPITHELIAL LYMPHOCYTES 2. Surgical [P], gastric - BENIGN GASTRIC MUCOSA - NO H. PYLORI, INTESTINAL METAPLASIA OR MALIGNANCY IDENTIFIED 3. Surgical [P], cecal, polyp - MULTIPLE FRAGMENTS OF TUBULAR ADENOMA(S) - NO HIGH GRADE DYSPLASIA OR MALIGNANCY IDENTIFIED 4. Surgical [P], right colon - BENIGN COLONIC MUCOSA - NO ACTIVE INFLAMMATION OR EVIDENCE OF MICROSCOPIC COLITIS - NO HIGH GRADE DYSPLASIA OR MALIGNANCY IDENTIFIED 5. Surgical [P], left colon - BENIGN COLONIC MUCOSA - NO ACTIVE INFLAMMATION OR EVIDENCE OF MICROSCOPIC COLITIS - NO HIGH GRADE DYSPLASIA OR MALIGNANCY IDENTIFIED 6. Surgical [P], sigmoid, polyp - TUBULAR ADENOMA (3 OF 3 FRAGMENTS) - NO HIGH GRADE DYSPLASIA OR MALIGNANCY IDENTIFIED  Current Outpatient Medications on File Prior to Visit  Medication Sig Dispense Refill  . AUVI-Q 0.3 MG/0.3ML SOAJ injection Inject 0.3 mg into the muscle as needed for anaphylaxis. 2 each 1  . azelastine (ASTELIN) 0.1 % nasal spray Place 1-2 sprays in each nostril two times a day as needed for runny nose 30 mL 5  . citalopram (CELEXA) 20 MG tablet Take 1 tablet (20 mg total) by mouth daily. 90 tablet 2  . famotidine (PEPCID) 20 MG tablet Take 1 tablet (20 mg total) by mouth 2 (two) times daily. 60 tablet 5  . fexofenadine (ALLEGRA) 180 MG tablet Take 180 mg by mouth daily.     . montelukast (SINGULAIR) 10 MG tablet  Take 1 tablet (10 mg total) by mouth at bedtime. 31 tablet 5  . Rimegepant Sulfate (NURTEC PO) Take by mouth.     . triamcinolone cream (KENALOG) 0.1 % SMARTSIG:Topical Twice a Week PRN    . Vitamin D, Ergocalciferol, (DRISDOL) 1.25 MG (50000 UNIT) CAPS capsule Take 50,000 Units by mouth once a week.      No current facility-administered medications on file prior to visit.   Allergies  Allergen Reactions  . Shellfish Allergy Anaphylaxis  . Hydrocodone Itching    Pt states he feels itchy after taking, but it doesn't preclude him taking hydrocodone if needed About 2-3 years ago.  It was the first time he took hydrocodone.  . Other Diarrhea    Slightly allergic to red meat since tick bite.Marland KitchenMarland KitchenNot anaphylaxis, but GI sensitivity.    Current Medications, Allergies, Past Medical History, Past Surgical History, Family History and Social History were reviewed in [employer] record.   Review of Systems:   Constitutional:  Negative for fever, sweats, chills or weight loss.  Respiratory: Negative for shortness of breath.   Cardiovascular: See HPI.  Gastrointestinal: See HPI.  Musculoskeletal: Negative for back pain or muscle aches.  Neurological: Negative for dizziness, headaches or paresthesias.    Physical Exam: BP 118/66   Pulse 76   Ht 6\' 6"  (1.981 m)   Wt (!) 362 lb (164.2 kg)   BMI 41.83 kg/m  General: Well developed 24 year old male in no acute distress. Head: Normocephalic and atraumatic. Eyes: No scleral icterus. Conjunctiva pink . Ears: Normal auditory acuity. Mouth: Dentition intact. No ulcers or lesions.  Lungs: Clear throughout to auscultation. Heart: Regular rate and rhythm, no murmur. Abdomen: Soft, nontender and nondistended. No masses or hepatomegaly. Normal bowel sounds x 4 quadrants.  Rectal: Deferred.  Musculoskeletal: Symmetrical with no gross deformities. Extremities: No edema. Neurological: Alert oriented x 4. No focal deficits.    Psychological: Alert and cooperative. Normal mood and affect  Assessment and Recommendations:  34. 24 year old male with nausea, vomiting (spontaneous and self induced) secondary to post nasal drainage, possible GERD related water brash and possible gallbladder component. -Omeprazole 20mg  po bid (he prefers not to take Omeprazole 40mg  po bid) for possible GERD related water brash -Continue ENT/allergist follow up -Patient to follow up in the office in 3 months. Further recommendations to be determined after CCK hida completed.  2. Atypical chest pain. Negative cardiac stress test 12/2017. Normal EGD 02/2019. Barium swallow 03/2020 with tertiary contractions/mild esophageal dysmotility. Normal esophagea manometry 05/2020. Possible esophageal spasms verses gallbladder dyskinesia verses anxiety.  -Trial with Hyoscyamine 0.125mg  one tab SL Q 6 hrs PRN. (Patient's insurance did not cover the SL form therefore his RX was changed to po form).  -[facility] HIDA scan  3. RUQ pain, intermittent. RUQ sono 06/2020 was normal.  -See plan in # 1 and # 2  4. History of a 19mm tubular adenomatous colon polyp removed from the cecum and a 16mm tubular adenomatous polyp removed from the sigmoid colon per colonoscopy 02/2019. -Dr. Bryan Lemma verified 3 year colonoscopy recall secondary to > 1cm TA cecal polyp. Recommend additional bowel prep with next colonoscopy due 02/2022.

## 2020-08-04 ENCOUNTER — Encounter: Payer: Self-pay | Admitting: Physician Assistant

## 2020-08-04 ENCOUNTER — Ambulatory Visit (INDEPENDENT_AMBULATORY_CARE_PROVIDER_SITE_OTHER): Payer: 59 | Admitting: Physician Assistant

## 2020-08-04 ENCOUNTER — Other Ambulatory Visit: Payer: Self-pay

## 2020-08-04 VITALS — BP 120/82 | HR 91 | Temp 98.4°F | Resp 16 | Ht 78.0 in | Wt 361.0 lb

## 2020-08-04 DIAGNOSIS — L729 Follicular cyst of the skin and subcutaneous tissue, unspecified: Secondary | ICD-10-CM | POA: Diagnosis not present

## 2020-08-04 DIAGNOSIS — H6121 Impacted cerumen, right ear: Secondary | ICD-10-CM

## 2020-08-04 NOTE — Progress Notes (Signed)
Agree with the assessment and plan as outlined by Bradley Best, NP. Bradley [name] has diagnosed GI pathologies as outlined, but also has superimposed functional symptoms exacerbated by anxiety, which makes parsing out sxs and establishing dx difficult. Will continue tx plan as outlined.    Gerrit Heck, DO, [facility]

## 2020-08-04 NOTE — Patient Instructions (Signed)
Please avoid touching the area. Let's watch it over the next 1-2 weeks. If no swelling, redness or pain, nothing needed. This is likely a cyst in the skin and fatty tissue.  No evidence of infection.    Earwax Buildup, Adult The ears produce a substance called earwax that helps keep bacteria out of the ear and protects the skin in the ear canal. Occasionally, earwax can build up in the ear and cause discomfort or hearing loss. What increases the risk? This condition is more likely to develop in people who:  Are male.  Are elderly.  Naturally produce more earwax.  Clean their ears often with cotton swabs.  Use earplugs often.  Use in-ear headphones often.  Wear hearing aids.  Have narrow ear canals.  Have earwax that is overly thick or sticky.  Have eczema.  Are dehydrated.  Have excess hair in the ear canal. What are the signs or symptoms? Symptoms of this condition include:  Reduced or muffled hearing.  A feeling of fullness in the ear or feeling that the ear is plugged.  Fluid coming from the ear.  Ear pain.  Ear itch.  Ringing in the ear.  Coughing.  An obvious piece of earwax that can be seen inside the ear canal. How is this diagnosed? This condition may be diagnosed based on:  Your symptoms.  Your medical history.  An ear exam. During the exam, your health care provider will look into your ear with an instrument called an otoscope. You may have tests, including a hearing test. How is this treated? This condition may be treated by:  Using ear drops to soften the earwax.  Having the earwax removed by a health care provider. The health care provider may: ? Flush the ear with water. ? Use an instrument that has a loop on the end (curette). ? Use a suction device.  Surgery to remove the wax buildup. This may be done in severe cases. Follow these instructions at home:   Take over-the-counter and prescription medicines only as told by your  health care provider.  Do not put any objects, including cotton swabs, into your ear. You can clean the opening of your ear canal with a washcloth or facial tissue.  Follow instructions from your health care provider about cleaning your ears. Do not over-clean your ears.  Drink enough fluid to keep your urine clear or pale yellow. This will help to thin the earwax.  Keep all follow-up visits as told by your health care provider. If earwax builds up in your ears often or if you use hearing aids, consider seeing your health care provider for routine, preventive ear cleanings. Ask your health care provider how often you should schedule your cleanings.  If you have hearing aids, clean them according to instructions from the manufacturer and your health care provider. Contact a health care provider if:  You have ear pain.  You develop a fever.  You have blood, pus, or other fluid coming from your ear.  You have hearing loss.  You have ringing in your ears that does not go away.  Your symptoms do not improve with treatment.  You feel like the room is spinning (vertigo). Summary  Earwax can build up in the ear and cause discomfort or hearing loss.  The most common symptoms of this condition include reduced or muffled hearing and a feeling of fullness in the ear or feeling that the ear is plugged.  This condition may be diagnosed based  on your symptoms, your medical history, and an ear exam.  This condition may be treated by using ear drops to soften the earwax or by having the earwax removed by a health care provider.  Do not put any objects, including cotton swabs, into your ear. You can clean the opening of your ear canal with a washcloth or facial tissue. This information is not intended to replace advice given to you by your health care provider. Make sure you discuss any questions you have with your health care provider. Document Revised: 08/24/2017 Document Reviewed:  11/22/2016 Elsevier Patient Education  2020 [employer].

## 2020-08-04 NOTE — Progress Notes (Signed)
Patient presents to clinic today c/o a bump under the skin and right inguinal region, first noted 2 weeks ago.  Is not painful or itchy.  Does not break through the skin.  States it will occasionally get slightly sore if he messes with the area.  Denies any redness or swelling of the overlying skin.  Denies fever, chills, malaise or fatigue.  Denies any change in the area since first noticed.  Denies similar lesion elsewhere.  Past Medical History:  Diagnosis Date  . Anxiety   . Chronic headaches   . Clavicular fracture   . History of chickenpox   . Mild intermittent asthma 06/07/2018  . Shingles    24 years old.  Marland Kitchen Urticaria 05/18/2020    Current Outpatient Medications on File Prior to Visit  Medication Sig Dispense Refill  . AUVI-Q 0.3 MG/0.3ML SOAJ injection Inject 0.3 mg into the muscle as needed for anaphylaxis. 2 each 1  . azelastine (ASTELIN) 0.1 % nasal spray Place 1-2 sprays in each nostril two times a day as needed for runny nose 30 mL 5  . citalopram (CELEXA) 20 MG tablet Take 1 tablet (20 mg total) by mouth daily. 90 tablet 2  . famotidine (PEPCID) 20 MG tablet Take 1 tablet (20 mg total) by mouth 2 (two) times daily. 60 tablet 5  . fexofenadine (ALLEGRA) 180 MG tablet Take 180 mg by mouth daily.     . hyoscyamine (LEVSIN) 0.125 MG tablet One tablet every 6-8 hours as needed for esophageal spasms 30 tablet 0  . montelukast (SINGULAIR) 10 MG tablet Take 1 tablet (10 mg total) by mouth at bedtime. 31 tablet 5  . omeprazole (PRILOSEC) 20 MG capsule Take 1 capsule (20 mg total) by mouth 2 (two) times daily before a meal. 60 capsule 2  . Rimegepant Sulfate 75 MG TBDP Take by mouth.    . triamcinolone cream (KENALOG) 0.1 % SMARTSIG:Topical Twice a Week PRN    . Vitamin D, Ergocalciferol, (DRISDOL) 1.25 MG (50000 UNIT) CAPS capsule Take 50,000 Units by mouth once a week.      No current facility-administered medications on file prior to visit.    Allergies  Allergen Reactions    . Shellfish Allergy Anaphylaxis  . Hydrocodone Itching    Pt states he feels itchy after taking, but it doesn't preclude him taking hydrocodone if needed About 2-3 years ago.  It was the first time he took hydrocodone.  . Other Diarrhea    Slightly allergic to red meat since tick bite.Marland KitchenMarland KitchenNot anaphylaxis, but GI sensitivity.    Family History  Problem Relation Age of Onset  . Hypertension Mother   . Colon polyps Mother        might be mistakrn  . Diabetes Father        type II  . Migraines Sister   . Colon cancer Neg Hx   . Esophageal cancer Neg Hx   . Rectal cancer Neg Hx   . Stomach cancer Neg Hx     Social History   Socioeconomic History  . Marital status: Single    Spouse name: Not on file  . Number of children: 0  . Years of education: College  . Highest education level: Not on file  Occupational History  . Occupation: Sales executive  Tobacco Use  . Smoking status: Never Smoker  . Smokeless tobacco: Never Used  Vaping Use  . Vaping Use: Never used  Substance and Sexual Activity  . Alcohol use: Not  Currently  . Drug use: Yes    Types: Marijuana    Comment: rare use  . Sexual activity: Yes    Partners: Female  Other Topics Concern  . Not on file  Social History Narrative   Lives alone   Caffeine use: 1 cup coffee per day   Right handed    Social Determinants of Health   Financial Resource Strain:   . Difficulty of Paying Living Expenses: Not on file  Food Insecurity:   . Worried About Charity fundraiser in the Last Year: Not on file  . Ran Out of Food in the Last Year: Not on file  Transportation Needs:   . Lack of Transportation (Medical): Not on file  . Lack of Transportation (Non-Medical): Not on file  Physical Activity:   . Days of Exercise per Week: Not on file  . Minutes of Exercise per Session: Not on file  Stress:   . Feeling of Stress : Not on file  Social Connections:   . Frequency of Communication with Friends and Family: Not on file  .  Frequency of Social Gatherings with Friends and Family: Not on file  . Attends Religious Services: Not on file  . Active Member of Clubs or Organizations: Not on file  . Attends Archivist Meetings: Not on file  . Marital Status: Not on file   Review of Systems - See HPI.  All other ROS are negative.  BP 120/82   Pulse 91   Temp 98.4 F (36.9 C) (Temporal)   Resp 16   Ht 6' 6"  (1.981 m)   Wt (!) 361 lb (163.7 kg)   SpO2 98%   BMI 41.72 kg/m   Physical Exam Vitals reviewed.  Constitutional:      Appearance: Normal appearance.  Cardiovascular:     Rate and Rhythm: Normal rate and regular rhythm.  Pulmonary:     Effort: Pulmonary effort is normal.  Musculoskeletal:     Cervical back: Neck supple.  Skin:      Neurological:     Mental Status: He is alert.     Recent Results (from the past 2160 hour(s))  Alpha-Gal Panel     Status: Abnormal   Collection Time: 05/20/20  1:25 PM  Result Value Ref Range   Beef (Bos spp) IgE 0.51 (H) <0.35 kU/L   Class Interpretation 1    Lamb/Mutton (Ovis spp) IgE 0.38 (H) <0.35 kU/L   Class Interpretation 1    Pork (Sus spp) IgE 0.32 <0.35 kU/L   Class Interpretation 0/1     Comment: The test method is the [employer] ImmunoCAP allergen-specific IgE system. CLASS INTERPRETATION   <0.10 kU/L= 0, Negative; 0.10 - 0.34 kU/L= 0/1, Equivocal/Borderline; 0.35 - 0.69  kU/L=1, Low Positive; 0.70 - 3.49 kU/L=2, Moderate Positive;  3.50  - 17.49 kU/L=3, High Positive; 17.50 - 49.99 kU/L= 4, Very High Positive; 50.00 - 99.99 kU/L= 5, Very High Positive;   >99.99 kU/L=6, Very High Positive    Alpha Gal IgE* 0.75 (H) <0.10 kU/L    Comment: Previous reports (JACI [id number]) have demonstrated that patients with IgE antibodies to galactose-a-1,3-galactose are at risk for delayed anaphylaxis, angioedema, or urticaria following consumption of beef, pork, or lamb.   ANA w/Reflex     Status: None   Collection Time: 05/20/20  1:25 PM    Result Value Ref Range   Anti Nuclear Antibody (ANA) Negative Negative  CBC with Differential/Platelet     Status: None  Collection Time: 05/20/20  1:25 PM  Result Value Ref Range   WBC 7.4 3.4 - 10.8 x10E3/uL   RBC 4.88 4.14 - 5.80 x10E6/uL   Hemoglobin 14.8 13.0 - 17.7 g/dL   Hematocrit 44.1 37.5 - 51.0 %   MCV 90 79 - 97 fL   MCH 30.3 26.6 - 33.0 pg   MCHC 33.6 31 - 35 g/dL   RDW 12.3 11.6 - 15.4 %   Platelets 193 150 - 450 x10E3/uL   Neutrophils 71 Not Estab. %   Lymphs 23 Not Estab. %   Monocytes 5 Not Estab. %   Eos 1 Not Estab. %   Basos 0 Not Estab. %   Neutrophils Absolute 5.1 1.40 - 7.00 x10E3/uL   Lymphocytes Absolute 1.7 0 - 3 x10E3/uL   Monocytes Absolute 0.4 0 - 0 x10E3/uL   EOS (ABSOLUTE) 0.1 0.0 - 0.4 x10E3/uL   Basophils Absolute 0.0 0 - 0 x10E3/uL   Immature Granulocytes 0 Not Estab. %   Immature Grans (Abs) 0.0 0.0 - 0.1 x10E3/uL  Chronic Urticaria     Status: None   Collection Time: 05/20/20  1:25 PM  Result Value Ref Range   cu index 4.7 <10    Comment: The CU Index(R) test is the second generation Functional Anti-FceR test.  Patients with a CU Index(R) greater than or equal to 10 have basophil reactive factors in their serum which supports an autoimmune basis for disease. *This test was developed and its performance characteristics determined by [employer]. It has not been cleared or approved by the U.S. Food and Drug Administration.   Comprehensive metabolic panel     Status: Abnormal   Collection Time: 05/20/20  1:25 PM  Result Value Ref Range   Glucose 88 65 - 99 mg/dL   BUN 12 6 - 20 mg/dL   Creatinine, Ser 0.75 (L) 0.76 - 1.27 mg/dL   GFR calc non Af Amer 128 >59 mL/min/1.73   GFR calc Af Amer 148 >59 mL/min/1.73    Comment: **[facility] currently reports eGFR in compliance with the current**   recommendations of the [employer]. [facility] will   update reporting as new guidelines are published from the NKF-ASN   Task  force.    BUN/Creatinine Ratio 16 9 - 20   Sodium 140 134 - 144 mmol/L   Potassium 4.6 3.5 - 5.2 mmol/L   Chloride 102 96 - 106 mmol/L   CO2 25 20 - 29 mmol/L   Calcium 9.1 8.7 - 10.2 mg/dL   Total Protein 6.7 6.0 - 8.5 g/dL   Albumin 4.6 4.1 - 5.2 g/dL   Globulin, Total 2.1 1.5 - 4.5 g/dL   Albumin/Globulin Ratio 2.2 1.2 - 2.2   Bilirubin Total 0.4 0.0 - 1.2 mg/dL   Alkaline Phosphatase 72 48 - 121 IU/L    Comment: **Effective June 07, 2020 Alkaline Phosphatase**   reference interval will be changing to:              Age                Male          Male           0 -  5 days         26 - 127       54 - 127           6 - 10 days  29 - 242       29 - 242          11 - 20 days        109 - 357      109 - 357          21 - 30 days         94 - 494       94 - 494           1 -  2 months      149 - 539      149 - 539           3 -  6 months      131 - 452      131 - 452           7 - 11 months      117 - 401      117 - 401   12 months -  6 years       158 - 369      158 - 369           7 - 12 years       150 - 409      150 - 409               13 years       4 - 435       15 - 227               14 years       114 - 375       21 - 161               15 years        27 - 279       54 - 134               16 years        39 - 207       76 - 121               17 years        54 - 161       35 - 113          72 - 20 years        59 - 125       42 - 106              >20 years         44 - 121       44 - 121    AST 13 0 - 40 IU/L   ALT 21 0 - 44 IU/L  C-reactive protein     Status: None   Collection Time: 05/20/20  1:25 PM  Result Value Ref Range   CRP 4 0 - 10 mg/L  Tryptase     Status: None   Collection Time: 05/20/20  1:25 PM  Result Value Ref Range   Tryptase 6.5 2.2 - 13.2 ug/L  Thyroid Cascade Profile     Status: None   Collection Time: 05/20/20  1:25 PM  Result Value Ref Range   TSH 1.120 0.45 - 4.50 uIU/mL    Comment: No apparent thyroid disorder. Additional  testing not indicated. In rare instances, Secondary Hypothyroidism as well as Subclinical Hypothyroidism have been reported in some patients  with normal TSH values.   Sedimentation rate     Status: None   Collection Time: 05/20/20  1:25 PM  Result Value Ref Range   Sed Rate 7 0 - 15 mm/hr  SARS CORONAVIRUS 2 (TAT 6-24 HRS) Nasopharyngeal Nasopharyngeal Swab     Status: None   Collection Time: 06/07/20  9:37 AM   Specimen: Nasopharyngeal Swab  Result Value Ref Range   SARS Coronavirus 2 NEGATIVE NEGATIVE    Comment: (NOTE) SARS-CoV-2 target nucleic acids are NOT DETECTED.  The SARS-CoV-2 RNA is generally detectable in upper and lower respiratory specimens during the acute phase of infection. Negative results do not preclude SARS-CoV-2 infection, do not rule out co-infections with other pathogens, and should not be used as the sole basis for treatment or other patient management decisions. Negative results must be combined with clinical observations, patient history, and epidemiological information. The expected result is Negative.  Fact Sheet for Patients: SugarRoll.be  Fact Sheet for Healthcare Providers: https://www.woods-mathews.com/  This test is not yet approved or cleared by the [place] FDA and  has been authorized for detection and/or diagnosis of SARS-CoV-2 by FDA under an Emergency Use Authorization (EUA). This EUA will remain  in effect (meaning this test can be used) for the duration of the COVID-19 declaration under Se ction 564(b)(1) of the Act, 21 U.S.C. section 360bbb-3(b)(1), unless the authorization is terminated or revoked sooner.  Performed at [facility], [place] [address], [place], [place] [address]   CBC w/Diff     Status: None   Collection Time: 06/30/20  1:33 PM  Result Value Ref Range   WBC 6.8 4.0 - 10.5 K/uL   RBC 4.71 4.22 - 5.81 Mil/uL   Hemoglobin 14.5 13.0 - 17.0 g/dL   HCT 42.8 39 - 52 %     MCV 90.8 78.0 - 100.0 fl   MCHC 33.8 30.0 - 36.0 g/dL   RDW 13.3 11.5 - 15.5 %   Platelets 194.0 150 - 400 K/uL   Neutrophils Relative % 69.1 43 - 77 %   Lymphocytes Relative 24.2 12 - 46 %   Monocytes Relative 4.3 3 - 12 %   Eosinophils Relative 1.8 0 - 5 %   Basophils Relative 0.6 0 - 3 %   Neutro Abs 4.7 1.4 - 7.7 K/uL   Lymphs Abs 1.6 0.7 - 4.0 K/uL   Monocytes Absolute 0.3 0.1 - 1.0 K/uL   Eosinophils Absolute 0.1 0.0 - 0.7 K/uL   Basophils Absolute 0.0 0.0 - 0.1 K/uL  Comp Met (CMET)     Status: None   Collection Time: 06/30/20  1:33 PM  Result Value Ref Range   Sodium 137 135 - 145 mEq/L   Potassium 3.9 3.5 - 5.1 mEq/L   Chloride 103 96 - 112 mEq/L   CO2 27 19 - 32 mEq/L   Glucose, Bld 88 70 - 99 mg/dL   BUN 13 6 - 23 mg/dL   Creatinine, Ser 0.75 0.40 - 1.50 mg/dL   Total Bilirubin 0.6 0.2 - 1.2 mg/dL   Alkaline Phosphatase 62 39 - 117 U/L   AST 25 0 - 37 U/L   ALT 36 0 - 53 U/L   Total Protein 6.6 6.0 - 8.3 g/dL   Albumin 4.3 3.5 - 5.2 g/dL   GFR 127.49 >60.00 mL/min   Calcium 8.9 8.4 - 10.5 mg/dL    Assessment/Plan: 1. Cyst of skin No evidence of inflammation or infection.  Reassurance given.  Patient to leave the area alone.  As long as remaining asymptomatic nothing that has to be done.  Can be removed for cosmetic purposes if he so desires.  Signs and symptoms of infection discussed with patient that would prompt need for reevaluation.  Patient voiced understanding and agreement with plan.  This visit occurred during the SARS-CoV-2 public health emergency.  Safety protocols were in place, including screening questions prior to the visit, additional usage of staff PPE, and extensive cleaning of exam room while observing appropriate contact time as indicated for disinfecting solutions.     Leeanne Rio, PA-C

## 2020-08-10 ENCOUNTER — Other Ambulatory Visit: Payer: Self-pay

## 2020-08-10 ENCOUNTER — Encounter (HOSPITAL_COMMUNITY)
Admission: RE | Admit: 2020-08-10 | Discharge: 2020-08-10 | Disposition: A | Discharge status: Home / Self Care | Payer: 59 | Source: Ambulatory Visit | Attending: Nurse Practitioner | Admitting: Nurse Practitioner

## 2020-08-10 ENCOUNTER — Telehealth: Payer: Self-pay | Admitting: Family Medicine

## 2020-08-10 DIAGNOSIS — R112 Nausea with vomiting, unspecified: Secondary | ICD-10-CM

## 2020-08-10 MED ORDER — FLUDEOXYGLUCOSE F - 18 (FDG) INJECTION: 5.0000 | Freq: Once | INTRAVENOUS | Status: DC | PRN

## 2020-08-10 MED ORDER — TECHNETIUM TC 99M MEBROFENIN IV KIT: 5.0000 | PACK | Freq: Once | INTRAVENOUS | Status: DC | PRN

## 2020-08-10 NOTE — Telephone Encounter (Signed)
[facility] called stating that the patient is scheduled for an MRI on 08/16/2020 but there is not an authorization on file. Can you help with this?

## 2020-08-10 NOTE — Telephone Encounter (Signed)
Patient has been authorized for this I have all the notes and authorization information within the referral itself

## 2020-08-16 ENCOUNTER — Other Ambulatory Visit: Payer: 59

## 2020-08-17 ENCOUNTER — Encounter: Payer: Self-pay | Admitting: Physician Assistant

## 2020-09-06 ENCOUNTER — Other Ambulatory Visit: Payer: Self-pay

## 2020-09-06 ENCOUNTER — Ambulatory Visit
Admission: RE | Admit: 2020-09-06 | Discharge: 2020-09-06 | Disposition: A | Discharge status: Home / Self Care | Payer: 59 | Source: Ambulatory Visit | Attending: Family Medicine | Admitting: Family Medicine

## 2020-09-06 DIAGNOSIS — M79661 Pain in right lower leg: Secondary | ICD-10-CM

## 2020-09-07 ENCOUNTER — Encounter: Payer: Self-pay | Admitting: Family Medicine

## 2020-09-08 ENCOUNTER — Encounter: Payer: Self-pay | Admitting: Physician Assistant

## 2020-09-08 ENCOUNTER — Telehealth (INDEPENDENT_AMBULATORY_CARE_PROVIDER_SITE_OTHER): Payer: 59 | Admitting: Physician Assistant

## 2020-09-08 ENCOUNTER — Other Ambulatory Visit: Payer: Self-pay

## 2020-09-08 DIAGNOSIS — Z20822 Contact with and (suspected) exposure to covid-19: Secondary | ICD-10-CM

## 2020-09-08 DIAGNOSIS — B9689 Other specified bacterial agents as the cause of diseases classified elsewhere: Secondary | ICD-10-CM | POA: Diagnosis not present

## 2020-09-08 DIAGNOSIS — J019 Acute sinusitis, unspecified: Secondary | ICD-10-CM | POA: Diagnosis not present

## 2020-09-08 MED ORDER — AMOXICILLIN-POT CLAVULANATE 875-125 MG PO TABS: 1.0000 | ORAL_TABLET | Freq: Two times a day (BID) | ORAL | 0 refills | Status: AC

## 2020-09-08 NOTE — Progress Notes (Signed)
Virtual Visit via Video   I connected with patient on 09/08/20 at 11:00 AM EST by a video enabled telemedicine application and verified that I am speaking with the correct person using two identifiers.  Location patient: Home Location provider: [facility], Office Persons participating in the virtual visit: Patient, Provider, [place] (Patina Moore)  I discussed the limitations of evaluation and management by telemedicine and the availability of in person appointments. The patient expressed understanding and agreed to proceed.  Subjective:   HPI:   Patient presents via [facility] today c/o 6 days of chest congestion with cough now productive of colored sputum , sinus pressure and sinus pain. Associated symptoms include fatigue and loss of taste or smell. Did get COVID and flu tested -- both were negative. Has been taking Mucinex to help with congestion. Denies any chest pain or SOB.  ROS:   See pertinent positives and negatives per HPI.  Patient Active Problem List   Diagnosis Date Noted  . Right ankle pain 07/06/2020  . Dysphagia   . Gastroesophageal reflux disease 05/19/2020  . Urticaria 05/18/2020  . Seasonal and perennial allergic rhinoconjunctivitis 04/19/2020  . Mild persistent asthma, uncomplicated 95/28/4132  . Low back pain 12/18/2019  . Shortness of breath 10/15/2019  . 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 Hamstring injury, right, initial encounter 01/23/2019  . Migraine without aura and without status migrainosus, not intractable 07/10/2018  . Morbid obesity with BMI of 40.0-44.9, adult ([place]) 07/10/2018  . Mild intermittent asthma without complication 44/09/270  . Atypical chest pain 12/21/2017  . Generalized abdominal pain 12/17/2017  . Anxiety 11/28/2017    Social History   Tobacco Use  . Smoking status: Never Smoker  . Smokeless tobacco: Never Used  Substance Use Topics  . Alcohol use: Not Currently    Current Outpatient  Medications:  .  albuterol (VENTOLIN HFA) 108 (90 Base) MCG/ACT inhaler, Inhale into the lungs., Disp: , Rfl:  .  AUVI-Q 0.3 MG/0.3ML SOAJ injection, Inject 0.3 mg into the muscle as needed for anaphylaxis., Disp: 2 each, Rfl: 1 .  azelastine (ASTELIN) 0.1 % nasal spray, Place 1-2 sprays in each nostril two times a day as needed for runny nose, Disp: 30 mL, Rfl: 5 .  citalopram (CELEXA) 20 MG tablet, Take 1 tablet (20 mg total) by mouth daily., Disp: 90 tablet, Rfl: 2 .  famotidine (PEPCID) 20 MG tablet, Take 1 tablet (20 mg total) by mouth 2 (two) times daily., Disp: 60 tablet, Rfl: 5 .  fexofenadine (ALLEGRA) 180 MG tablet, Take 180 mg by mouth daily. , Disp: , Rfl:  .  hyoscyamine (LEVSIN) 0.125 MG tablet, One tablet every 6-8 hours as needed for esophageal spasms, Disp: 30 tablet, Rfl: 0 .  montelukast (SINGULAIR) 10 MG tablet, Take 1 tablet (10 mg total) by mouth at bedtime., Disp: 31 tablet, Rfl: 5 .  omeprazole (PRILOSEC) 20 MG capsule, Take 1 capsule (20 mg total) by mouth 2 (two) times daily before a meal., Disp: 60 capsule, Rfl: 2 .  Rimegepant Sulfate 75 MG TBDP, Take by mouth., Disp: , Rfl:  .  triamcinolone cream (KENALOG) 0.1 %, SMARTSIG:Topical Twice a Week PRN, Disp: , Rfl:  .  Vitamin D, Ergocalciferol, (DRISDOL) 1.25 MG (50000 UNIT) CAPS capsule, Take 50,000 Units by mouth once a week. , Disp: , Rfl:   Allergies  Allergen Reactions  . Shellfish Allergy Anaphylaxis  . Hydrocodone Itching    Pt states he feels itchy after taking,  but it doesn't preclude him taking hydrocodone if needed About 2-3 years ago.  It was the first time he took hydrocodone.  . Other Diarrhea    Slightly allergic to red meat since tick bite.Marland KitchenMarland KitchenNot anaphylaxis, but GI sensitivity.    Objective:   There were no vitals taken for this visit.  Patient is well-developed, well-nourished in no acute distress.  Resting comfortably at home.  Head is normocephalic, atraumatic.  No labored breathing.   Speech is clear and coherent with logical content.  Patient is alert and oriented at baseline.   Assessment and Plan:   1. Suspected COVID-19 virus infection Will have patient rechecked for COVID due to change in smell. Increase fluids. Quarantine discussed. Start Vitamin regimen which was reviewed. If positive would have him set up for MAb Infusion.   2. Acute bacterial sinusitis Concern for secondary sinusitis. Rx Augmentin.  Increase fluids.  Rest.  Saline nasal spray.  Probiotic.  Mucinex as directed.  Humidifier in bedroom..  Call or return to clinic if symptoms are not improving.     Leeanne Rio, PA-C 09/08/2020

## 2020-09-08 NOTE — Progress Notes (Signed)
I have discussed the procedure for the virtual visit with the patient who has given consent to proceed with assessment and treatment.   Bradley [name] S Sherisse Fullilove, CMA     

## 2020-10-07 ENCOUNTER — Telehealth (INDEPENDENT_AMBULATORY_CARE_PROVIDER_SITE_OTHER): Payer: 59 | Admitting: Physician Assistant

## 2020-10-07 ENCOUNTER — Encounter: Payer: Self-pay | Admitting: Physician Assistant

## 2020-10-07 DIAGNOSIS — N529 Male erectile dysfunction, unspecified: Secondary | ICD-10-CM | POA: Diagnosis not present

## 2020-10-07 NOTE — Progress Notes (Signed)
Virtual Visit via Telephone Note  I connected with Bradley [name] on 10/07/20 at 11:00 AM EST by telephone and verified that I am speaking with the correct person using two identifiers.  Location: Patient: Home Provider: [facility]   I discussed the limitations, risks, security and privacy concerns of performing an evaluation and management service by telephone and the availability of in person appointments. I also discussed with the patient that there may be a patient responsible charge related to this service. The patient expressed understanding and agreed to proceed.  History of Present Illness: Patient would like to discuss an issue he recently noticed with difficulty maintaining an erection.  Patient is wondering if this could be related to citalopram.  Patient notes recently being involved with a new male partner.  Notes on a couple occasions he had difficulty achieving or maintaining erection.  He states this was embarrassing for him.  Denies any decreased libido.  States he still wakes up with an erection in the morning and can get it other times.  Denies any abnormal curvature of penis with erection.  Denies any pain with achieving erection..   Observations/Objective: No labored breathing.  Speech is clear and coherent with logical content.  Patient is alert and oriented at baseline.   Assessment and Plan: 1. Failure of erection Discussed common etiologies of this.  Discussed that could potentially be related to his use of citalopram but he does not seem to have difficulty maintaining erection at other times, only a couple of times with this new partner.  Suspect this is more related to mental/emotional state and nervousness.  Discuss deep breathing exercises recommend he have an open conversation with new partner.  Discussed other strategies to help with maintenance of erection.  He wants to work on this for a bit.  If still having issue will consider addition of Wellbutrin to  his Celexa to see if this helps as he does not want to change Celexa because it does work very well for his anxiety and depression.   Follow Up Instructions:  I discussed the assessment and treatment plan with the patient. The patient was provided an opportunity to ask questions and all were answered. The patient agreed with the plan and demonstrated an understanding of the instructions.   The patient was advised to call back or seek an in-person evaluation if the symptoms worsen or if the condition fails to improve as anticipated.  I provided 10 minutes of non-face-to-face time during this encounter.   Leeanne Rio, PA-C

## 2020-10-07 NOTE — Patient Instructions (Signed)
Instructions sent to patients MyChart.

## 2020-11-24 ENCOUNTER — Encounter: Payer: Self-pay | Admitting: Allergy

## 2020-11-24 DIAGNOSIS — Z91018 Allergy to other foods: Secondary | ICD-10-CM

## 2020-12-13 ENCOUNTER — Other Ambulatory Visit: Payer: Self-pay

## 2020-12-13 ENCOUNTER — Ambulatory Visit: Payer: 59 | Admitting: Family Medicine

## 2020-12-13 ENCOUNTER — Encounter: Payer: Self-pay | Admitting: Family Medicine

## 2020-12-13 VITALS — BP 130/80 | HR 74 | Temp 98.2°F | Ht 78.0 in | Wt 335.2 lb

## 2020-12-13 DIAGNOSIS — W57XXXA Bitten or stung by nonvenomous insect and other nonvenomous arthropods, initial encounter: Secondary | ICD-10-CM | POA: Diagnosis not present

## 2020-12-13 DIAGNOSIS — S40862A Insect bite (nonvenomous) of left upper arm, initial encounter: Secondary | ICD-10-CM | POA: Diagnosis not present

## 2020-12-13 NOTE — Patient Instructions (Addendum)
Suspect some type to bite reaction.  Follow up for any fever or progressive redness, warmth, or swelling.

## 2020-12-13 NOTE — Progress Notes (Signed)
Established Patient Office Visit  Subjective:  Patient ID: Bradley [name], male    DOB: [birth year]  Age: 25 y.o. MRN: [id number]  CC:  Chief Complaint  Patient presents with  . Insect Bite    Noticed on Saturday; red, itchy, painful, drainage present.     HPI Bradley [name] presents for possible "insect bite" left upper inner arm.  He first noticed this Saturday when he was doing some work in the woods.  He did not actually see any kind of bite.  No tick removal.  No fevers or chills.  He has some itching and is very mild discomfort.  No history of MRSA.  He did not note any pustules.    Also relates some mild left ear congestion.  Mild nasal congestion.  No fever.  No hearing changes.  No vertigo.  Past Medical History:  Diagnosis Date  . Anxiety   . Chronic headaches   . Clavicular fracture   . History of chickenpox   . Mild intermittent asthma 06/07/2018  . Shingles    25 years old.  Marland Kitchen Urticaria 05/18/2020    Past Surgical History:  Procedure Laterality Date  . COLONOSCOPY    . CYSTECTOMY  01/2019   scrotum area   . ESOPHAGEAL MANOMETRY N/A 06/09/2020   Procedure: ESOPHAGEAL MANOMETRY (EM);  Surgeon: Lavena Bullion, DO;  Location: [facility];  Service: Gastroenterology;  Laterality: N/A;  . ESOPHAGOGASTRODUODENOSCOPY    . shoulder surgery Right 08/26/2019  . WISDOM TOOTH EXTRACTION      Family History  Problem Relation Age of Onset  . Hypertension Mother   . Colon polyps Mother        might be mistakrn  . Diabetes Father        type II  . Migraines Sister   . Colon cancer Neg Hx   . Esophageal cancer Neg Hx   . Rectal cancer Neg Hx   . Stomach cancer Neg Hx     Social History   Socioeconomic History  . Marital status: Single    Spouse name: Not on file  . Number of children: 0  . Years of education: College  . Highest education level: Not on file  Occupational History  . Occupation: Sales executive  Tobacco Use  . Smoking status: Never Smoker  .  Smokeless tobacco: Never Used  Vaping Use  . Vaping Use: Never used  Substance and Sexual Activity  . Alcohol use: Not Currently  . Drug use: Yes    Types: Marijuana    Comment: rare use  . Sexual activity: Yes    Partners: Female  Other Topics Concern  . Not on file  Social History Narrative   Lives alone   Caffeine use: 1 cup coffee per day   Right handed    Social Determinants of Health   Financial Resource Strain: Not on file  Food Insecurity: Not on file  Transportation Needs: Not on file  Physical Activity: Not on file  Stress: Not on file  Social Connections: Not on file  Intimate Partner Violence: Not on file    Outpatient Medications Prior to Visit  Medication Sig Dispense Refill  . albuterol (VENTOLIN HFA) 108 (90 Base) MCG/ACT inhaler Inhale into the lungs.    Marland Kitchen AUVI-Q 0.3 MG/0.3ML SOAJ injection Inject 0.3 mg into the muscle as needed for anaphylaxis. 2 each 1  . azelastine (ASTELIN) 0.1 % nasal spray Place 1-2 sprays in each nostril two times a day  as needed for runny nose 30 mL 5  . citalopram (CELEXA) 20 MG tablet Take 1 tablet (20 mg total) by mouth daily. 90 tablet 2  . famotidine (PEPCID) 20 MG tablet Take 1 tablet (20 mg total) by mouth 2 (two) times daily. 60 tablet 5  . fexofenadine (ALLEGRA) 180 MG tablet Take 180 mg by mouth daily.     . hyoscyamine (LEVSIN) 0.125 MG tablet One tablet every 6-8 hours as needed for esophageal spasms 30 tablet 0  . montelukast (SINGULAIR) 10 MG tablet Take 1 tablet (10 mg total) by mouth at bedtime. 31 tablet 5  . omeprazole (PRILOSEC) 20 MG capsule Take 1 capsule (20 mg total) by mouth 2 (two) times daily before a meal. 60 capsule 2  . Rimegepant Sulfate 75 MG TBDP Take by mouth.    . triamcinolone cream (KENALOG) 0.1 % SMARTSIG:Topical Twice a Week PRN    . Vitamin D, Ergocalciferol, (DRISDOL) 1.25 MG (50000 UNIT) CAPS capsule Take 50,000 Units by mouth once a week.      No facility-administered medications prior to  visit.    Allergies  Allergen Reactions  . Shellfish Allergy Anaphylaxis  . Hydrocodone Itching    Pt states he feels itchy after taking, but it doesn't preclude him taking hydrocodone if needed About 2-3 years ago.  It was the first time he took hydrocodone.  . Other Diarrhea    Slightly allergic to red meat since tick bite.Marland KitchenMarland KitchenNot anaphylaxis, but GI sensitivity.    ROS Review of Systems  Constitutional: Negative for chills and fever.  Musculoskeletal: Negative for arthralgias.  Neurological: Negative for headaches.      Objective:    Physical Exam Vitals reviewed.  HENT:     Right Ear: Tympanic membrane normal.     Left Ear: Tympanic membrane normal.  Cardiovascular:     Rate and Rhythm: Normal rate and regular rhythm.  Skin:    Comments: Left upper inner arm reveals small punctate area of the looks like probable bite.  There is very mild surrounding erythema.  No warmth.  Nontender.  No pustules.  No fluctuance.  No necrosis.     BP 130/80   Pulse 74   Temp 98.2 F (36.8 C) (Oral)   Ht 6\' 6"  (1.981 m)   Wt (!) 335 lb 4 oz (152.1 kg)   SpO2 98%   BMI 38.74 kg/m  Wt Readings from Last 3 Encounters:  12/13/20 (!) 335 lb 4 oz (152.1 kg)  08/04/20 (!) 361 lb (163.7 kg)  08/03/20 (!) 362 lb (164.2 kg)     Health Maintenance Due  Topic Date Due  . Hepatitis C Screening  Never done  . HPV VACCINES (2 - Male 3-dose series) 07/12/2018  . INFLUENZA VACCINE  04/25/2020  . COVID-19 Vaccine (3 - Booster for Pfizer series) 06/29/2020       Topic Date Due  . HPV VACCINES (2 - Male 3-dose series) 07/12/2018    Lab Results  Component Value Date   TSH 1.120 05/20/2020   Lab Results  Component Value Date   WBC 6.8 06/30/2020   HGB 14.5 06/30/2020   HCT 42.8 06/30/2020   MCV 90.8 06/30/2020   PLT 194.0 06/30/2020   Lab Results  Component Value Date   NA 137 06/30/2020   K 3.9 06/30/2020   CO2 27 06/30/2020   GLUCOSE 88 06/30/2020   BUN 13 06/30/2020    CREATININE 0.75 06/30/2020   BILITOT 0.6 06/30/2020   ALKPHOS  62 06/30/2020   AST 25 06/30/2020   ALT 36 06/30/2020   PROT 6.6 06/30/2020   ALBUMIN 4.3 06/30/2020   CALCIUM 8.9 06/30/2020   ANIONGAP 11 10/16/2019   GFR 127.49 06/30/2020   Lab Results  Component Value Date   CHOL 132 01/21/2020   Lab Results  Component Value Date   HDL 34.00 (L) 01/21/2020   Lab Results  Component Value Date   LDLCALC 82 01/21/2020   Lab Results  Component Value Date   TRIG 79.0 01/21/2020   Lab Results  Component Value Date   CHOLHDL 4 01/21/2020   Lab Results  Component Value Date   HGBA1C 5.0 01/21/2020      Assessment & Plan:   Problem List Items Addressed This Visit   None   Suspected insect bite of some sort left arm.  Does not have any pustules to suggest staph infection.  No evidence for erythema migrans.  No tenderness.  Suspect mild local allergic reaction  -Antihistamine if needed for itching -Watch for any fever, progressive redness, or other concerns  No orders of the defined types were placed in this encounter.   Follow-up: No follow-ups on file.    Carolann Littler, MD

## 2020-12-16 ENCOUNTER — Encounter: Payer: Self-pay | Admitting: Emergency Medicine

## 2020-12-16 ENCOUNTER — Other Ambulatory Visit: Payer: Self-pay

## 2020-12-16 ENCOUNTER — Encounter: Payer: Self-pay | Admitting: Family Medicine

## 2020-12-16 ENCOUNTER — Emergency Department
Admission: EM | Admit: 2020-12-16 | Discharge: 2020-12-16 | Disposition: A | Discharge status: Home / Self Care | Payer: 59 | Source: Home / Self Care

## 2020-12-16 DIAGNOSIS — K920 Hematemesis: Secondary | ICD-10-CM | POA: Diagnosis not present

## 2020-12-16 NOTE — Discharge Instructions (Signed)
If you notice an episode of bright red blood, follow up with GI or if you are vomiting only blood, follow up with the ER for further evaluation and treatment

## 2020-12-16 NOTE — ED Triage Notes (Signed)
Nausea, vomiting this morning mucus and bright red blood

## 2020-12-16 NOTE — ED Provider Notes (Signed)
[name]    CSN: [id number] Arrival date & time: 12/16/20  1003      History   Chief Complaint Chief Complaint  Patient presents with  . Nausea    HPI Bradley [name] is a 25 y.o. male.   Reports episode of emesis this morning with some bright red blood. Reports that he was in the shower and that he had an episode of post tussive vomiting and that he saw a small amount of bright red on the shower floor. Denies previous symptoms. Reports that he drank 2 margaritas last night. Denies nausea or vomiting last night. Denies diarrhea, melena, hematochezia, abdominal cramping, fever, headache, fatigue, rash, other symptoms. Has hx GERD and is not treating with medications at this time. He is followed by GI. Denies sick contacts.  ROS per HPI  The history is provided by the patient.    Past Medical History:  Diagnosis Date  . Anxiety   . Chronic headaches   . Clavicular fracture   . History of chickenpox   . Mild intermittent asthma 06/07/2018  . Shingles    25 years old.  Marland Kitchen Urticaria 05/18/2020    Patient Active Problem List   Diagnosis Date Noted  . Right ankle pain 07/06/2020  . Dysphagia   . Gastroesophageal reflux disease 05/19/2020  . Urticaria 05/18/2020  . Seasonal and perennial allergic rhinoconjunctivitis 04/19/2020  . Mild persistent asthma, uncomplicated 79/10/4095  . Low back pain 12/18/2019  . Shortness of breath 10/15/2019  . 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 Hamstring injury, right, initial encounter 01/23/2019  . Migraine without aura and without status migrainosus, not intractable 07/10/2018  . Morbid obesity with BMI of 40.0-44.9, adult ([place]) 07/10/2018  . Mild intermittent asthma without complication 35/32/9924  . Atypical chest pain 12/21/2017  . Generalized abdominal pain 12/17/2017  . Anxiety 11/28/2017    Past Surgical History:  Procedure Laterality Date  . COLONOSCOPY    . CYSTECTOMY  01/2019    scrotum area   . ESOPHAGEAL MANOMETRY N/A 06/09/2020   Procedure: ESOPHAGEAL MANOMETRY (EM);  Surgeon: Lavena Bullion, DO;  Location: [facility];  Service: Gastroenterology;  Laterality: N/A;  . ESOPHAGOGASTRODUODENOSCOPY    . shoulder surgery Right 08/26/2019  . WISDOM TOOTH EXTRACTION         Home Medications    Prior to Admission medications   Medication Sig Start Date End Date Taking? Authorizing Provider  levocetirizine (XYZAL) 5 MG tablet Take 5 mg by mouth every evening.   Yes [provider]  citalopram (CELEXA) 20 MG tablet Take 1 tablet (20 mg total) by mouth daily. 07/26/20   Brunetta Jeans, PA-C    Family History Family History  Problem Relation Age of Onset  . Hypertension Mother   . Colon polyps Mother        might be mistakrn  . Diabetes Father        type II  . Migraines Sister   . Colon cancer Neg Hx   . Esophageal cancer Neg Hx   . Rectal cancer Neg Hx   . Stomach cancer Neg Hx     Social History Social History   Tobacco Use  . Smoking status: Never Smoker  . Smokeless tobacco: Never Used  Vaping Use  . Vaping Use: Never used  Substance Use Topics  . Alcohol use: Yes  . Drug use: Yes    Types: Marijuana    Comment: rare use  Allergies   Shellfish allergy, Hydrocodone, and Other   Review of Systems Review of Systems   Physical Exam Triage Vital Signs ED Triage Vitals  Enc Vitals Group     BP 12/16/20 1016 135/86     Pulse Rate 12/16/20 1016 68     Resp 12/16/20 1016 18     Temp 12/16/20 1016 98.4 F (36.9 C)     Temp Source 12/16/20 1016 Oral     SpO2 12/16/20 1016 98 %     Weight 12/16/20 1017 (!) 330 lb (149.7 kg)     Height 12/16/20 1017 6\' 6"  (1.981 m)     Head Circumference --      Peak Flow --      Pain Score 12/16/20 1017 0     Pain Loc --      Pain Edu? --      Excl. in [place]? --    No data found.  Updated Vital Signs BP 135/86 (BP Location: Left Arm)   Pulse 68   Temp 98.4 F (36.9 C) (Oral)    Resp 18   Ht 6\' 6"  (1.981 m)   Wt (!) 330 lb (149.7 kg)   SpO2 98%   BMI 38.14 kg/m     Physical Exam Vitals and nursing note reviewed.  Constitutional:      General: He is not in acute distress.    Appearance: Normal appearance. He is well-developed. He is obese.  HENT:     Head: Normocephalic and atraumatic.     Nose: Nose normal.     Mouth/Throat:     Mouth: Mucous membranes are moist.     Pharynx: Oropharynx is clear.     Comments: Cobblestoning present Eyes:     Extraocular Movements: Extraocular movements intact.     Conjunctiva/sclera: Conjunctivae normal.     Pupils: Pupils are equal, round, and reactive to light.  Cardiovascular:     Rate and Rhythm: Normal rate and regular rhythm.     Heart sounds: Normal heart sounds. No murmur heard.   Pulmonary:     Effort: Pulmonary effort is normal. No respiratory distress.     Breath sounds: Normal breath sounds. No stridor. No wheezing, rhonchi or rales.  Chest:     Chest wall: No tenderness.  Abdominal:     General: Bowel sounds are normal. There is no distension.     Palpations: Abdomen is soft. There is no mass.     Tenderness: There is no abdominal tenderness. There is no right CVA tenderness, left CVA tenderness, guarding or rebound.     Hernia: No hernia is present.  Musculoskeletal:        General: Normal range of motion.     Cervical back: Normal range of motion and neck supple.  Skin:    General: Skin is warm and dry.     Capillary Refill: Capillary refill takes less than 2 seconds.  Neurological:     General: No focal deficit present.     Mental Status: He is alert and oriented to person, place, and time.  Psychiatric:        Mood and Affect: Mood normal.        Behavior: Behavior normal.        Thought Content: Thought content normal.      UC Treatments / Results  Labs (all labs ordered are listed, but only abnormal results are displayed) Labs Reviewed - No data to  display  EKG   Radiology  No results found.  Procedures Procedures (including critical care time)  Medications Ordered in UC Medications - No data to display  Initial Impression / Assessment and Plan / UC Course  I have reviewed the triage vital signs and the nursing notes.  Pertinent labs & imaging results that were available during my care of the patient were reviewed by me and considered in my medical decision making (see chart for details).    Hematemesis  No current nausea May take home zofran as needed If you notice small amount of blood in vomit again, follow up with GI Differentials include esophagitis, PUD Low suspicion for anemia, GI bleed given no fatigue, only one episode, no bloating or abdominal distention, no melena, no hematochezia If you vomit only blood, follow up in the ER for further evaluation and treatment If you experience increased fatigue, dizziness, blood in stool or urine, seek medical care  Final Clinical Impressions(s) / UC Diagnoses   Final diagnoses:  Hematemesis with nausea     Discharge Instructions     If you notice an episode of bright red blood, follow up with GI or if you are vomiting only blood, follow up with the ER for further evaluation and treatment    ED Prescriptions    None     PDMP not reviewed this encounter.   Faustino Congress, NP 12/16/20 1138

## 2020-12-21 ENCOUNTER — Ambulatory Visit: Payer: 59 | Admitting: Family Medicine

## 2020-12-21 ENCOUNTER — Other Ambulatory Visit: Payer: Self-pay

## 2020-12-21 ENCOUNTER — Encounter: Payer: Self-pay | Admitting: Family Medicine

## 2020-12-21 VITALS — BP 122/70 | HR 78 | Temp 98.7°F | Wt 338.6 lb

## 2020-12-21 DIAGNOSIS — R0982 Postnasal drip: Secondary | ICD-10-CM

## 2020-12-21 DIAGNOSIS — K920 Hematemesis: Secondary | ICD-10-CM | POA: Diagnosis not present

## 2020-12-21 LAB — CBC WITH DIFFERENTIAL/PLATELET
Basophils Absolute: 0 10*3/uL (ref 0.0–0.1)
Basophils Relative: 0.5 % (ref 0.0–3.0)
Eosinophils Absolute: 0.1 10*3/uL (ref 0.0–0.7)
Eosinophils Relative: 0.8 % (ref 0.0–5.0)
HCT: 47.3 % (ref 39.0–52.0)
Hemoglobin: 15.8 g/dL (ref 13.0–17.0)
Lymphocytes Relative: 15.3 % (ref 12.0–46.0)
Lymphs Abs: 1.3 10*3/uL (ref 0.7–4.0)
MCHC: 33.4 g/dL (ref 30.0–36.0)
MCV: 90.6 fl (ref 78.0–100.0)
Monocytes Absolute: 0.4 10*3/uL (ref 0.1–1.0)
Monocytes Relative: 4.4 % (ref 3.0–12.0)
Neutro Abs: 6.5 10*3/uL (ref 1.4–7.7)
Neutrophils Relative %: 79 % — ABNORMAL HIGH (ref 43.0–77.0)
Platelets: 194 10*3/uL (ref 150.0–400.0)
RBC: 5.22 Mil/uL (ref 4.22–5.81)
RDW: 13.3 % (ref 11.5–15.5)
WBC: 8.2 10*3/uL (ref 4.0–10.5)

## 2020-12-21 NOTE — Progress Notes (Signed)
Established Patient Office Visit  Subjective:  Patient ID: Bradley [name], male    DOB: 1996/07/17  Age: 25 y.o. MRN: [id number]  CC:  Chief Complaint  Patient presents with  . Cough    Has had drainage x 1 week, noticed Friday there was blood in the mucus, seen in ed and told to follow up with PCP    HPI [name] presents for some recent increased postnasal drainage especially at night and 2 separate episodes of what sounds like hematemesis.  This was bright blood and very small volume.  First episode occurred last week on Friday and prompted urgent care visit.  No further testing was done.  He denies any episodes to this morning.  He was in the shower.  He had increased postnasal drip and had 1 episode of gagging and heaving along with some very small flecks of blood.  He has had no cough whatsoever.  No hemoptysis.  No melena.  No coffee-ground emesis.  No tachycardia.  No dizziness.  No recent nonsteroidal use.  No nosebleeds.  Rare alcohol use.  Denies any recurrent nausea or vomiting.  No unintentional weight loss.  No abdominal pain.  No dysphagia or odontophagia.  He had EGD 6/20 which showed some mild gastritis changes.  He is a non-smoker.  Previous EGD results and previous labs reviewed.  Past Medical History:  Diagnosis Date  . Anxiety   . Chronic headaches   . Clavicular fracture   . History of chickenpox   . Mild intermittent asthma 06/07/2018  . Shingles    25 years old.  Marland Kitchen Urticaria 05/18/2020    Past Surgical History:  Procedure Laterality Date  . COLONOSCOPY    . CYSTECTOMY  01/2019   scrotum area   . ESOPHAGEAL MANOMETRY N/A 06/09/2020   Procedure: ESOPHAGEAL MANOMETRY (EM);  Surgeon: Lavena Bullion, DO;  Location: [facility];  Service: Gastroenterology;  Laterality: N/A;  . ESOPHAGOGASTRODUODENOSCOPY    . shoulder surgery Right 08/26/2019  . WISDOM TOOTH EXTRACTION      Family History  Problem Relation Age of Onset  . Hypertension Mother   .  Colon polyps Mother        might be mistakrn  . Diabetes Father        type II  . Migraines Sister   . Colon cancer Neg Hx   . Esophageal cancer Neg Hx   . Rectal cancer Neg Hx   . Stomach cancer Neg Hx     Social History   Socioeconomic History  . Marital status: Single    Spouse name: Not on file  . Number of children: 0  . Years of education: College  . Highest education level: Not on file  Occupational History  . Occupation: Sales executive  Tobacco Use  . Smoking status: Never Smoker  . Smokeless tobacco: Never Used  Vaping Use  . Vaping Use: Never used  Substance and Sexual Activity  . Alcohol use: Yes  . Drug use: Yes    Types: Marijuana    Comment: rare use  . Sexual activity: Yes    Partners: Female  Other Topics Concern  . Not on file  Social History Narrative   Lives alone   Caffeine use: 1 cup coffee per day   Right handed    Social Determinants of Health   Financial Resource Strain: Not on file  Food Insecurity: Not on file  Transportation Needs: Not on file  Physical Activity: Not  on file  Stress: Not on file  Social Connections: Not on file  Intimate Partner Violence: Not on file    Outpatient Medications Prior to Visit  Medication Sig Dispense Refill  . citalopram (CELEXA) 20 MG tablet Take 1 tablet (20 mg total) by mouth daily. 90 tablet 2  . levocetirizine (XYZAL) 5 MG tablet Take 5 mg by mouth every evening.     No facility-administered medications prior to visit.    Allergies  Allergen Reactions  . Shellfish Allergy Anaphylaxis  . Hydrocodone Itching    Pt states he feels itchy after taking, but it doesn't preclude him taking hydrocodone if needed About 2-3 years ago.  It was the first time he took hydrocodone.  . Other Diarrhea    Slightly allergic to red meat since tick bite.Marland KitchenMarland KitchenNot anaphylaxis, but GI sensitivity.    ROS Review of Systems  Constitutional: Negative for appetite change, chills, fever and unexpected weight change.   Respiratory: Negative for shortness of breath.   Cardiovascular: Negative for chest pain.  Gastrointestinal: Negative for abdominal pain and blood in stool.  Neurological: Negative for dizziness and weakness.      Objective:    Physical Exam Vitals reviewed.  Constitutional:      Appearance: Normal appearance.  HENT:     Nose: Nose normal.     Mouth/Throat:     Mouth: Mucous membranes are moist.     Pharynx: Oropharynx is clear.  Cardiovascular:     Rate and Rhythm: Normal rate and regular rhythm.  Pulmonary:     Effort: Pulmonary effort is normal.     Breath sounds: Normal breath sounds.  Abdominal:     General: Bowel sounds are normal. There is no distension.     Palpations: Abdomen is soft. There is no mass.     Tenderness: There is no abdominal tenderness. There is no guarding or rebound.  Musculoskeletal:     Cervical back: Neck supple.  Neurological:     Mental Status: He is alert.     BP 122/70 (BP Location: Left Arm, Patient Position: Sitting, Cuff Size: Normal)   Pulse 78   Temp 98.7 F (37.1 C) (Oral)   Wt (!) 338 lb 9.6 oz (153.6 kg)   SpO2 97%   BMI 39.13 kg/m  Wt Readings from Last 3 Encounters:  12/21/20 (!) 338 lb 9.6 oz (153.6 kg)  12/16/20 (!) 330 lb (149.7 kg)  12/13/20 (!) 335 lb 4 oz (152.1 kg)     Health Maintenance Due  Topic Date Due  . Hepatitis C Screening  Never done  . HPV VACCINES (2 - Male 3-dose series) 07/12/2018  . INFLUENZA VACCINE  04/25/2020  . COVID-19 Vaccine (3 - Booster for Pfizer series) 06/29/2020       Topic Date Due  . HPV VACCINES (2 - Male 3-dose series) 07/12/2018    Lab Results  Component Value Date   TSH 1.120 05/20/2020   Lab Results  Component Value Date   WBC 6.8 06/30/2020   HGB 14.5 06/30/2020   HCT 42.8 06/30/2020   MCV 90.8 06/30/2020   PLT 194.0 06/30/2020   Lab Results  Component Value Date   NA 137 06/30/2020   K 3.9 06/30/2020   CO2 27 06/30/2020   GLUCOSE 88 06/30/2020   BUN  13 06/30/2020   CREATININE 0.75 06/30/2020   BILITOT 0.6 06/30/2020   ALKPHOS 62 06/30/2020   AST 25 06/30/2020   ALT 36 06/30/2020   PROT 6.6  06/30/2020   ALBUMIN 4.3 06/30/2020   CALCIUM 8.9 06/30/2020   ANIONGAP 11 10/16/2019   GFR 127.49 06/30/2020   Lab Results  Component Value Date   CHOL 132 01/21/2020   Lab Results  Component Value Date   HDL 34.00 (L) 01/21/2020   Lab Results  Component Value Date   LDLCALC 82 01/21/2020   Lab Results  Component Value Date   TRIG 79.0 01/21/2020   Lab Results  Component Value Date   CHOLHDL 4 01/21/2020   Lab Results  Component Value Date   HGBA1C 5.0 01/21/2020      Assessment & Plan:   Problem List Items Addressed This Visit   None   Visit Diagnoses    Hematemesis of unknown cause    -  Primary   Relevant Orders   CBC with Differential/Platelet    He relates 2 separate episodes of very small hematemesis with bright blood.  Nothing by history to suggest any chronic upper GI bleeding.  Denies any red flags such as melena, coffee-ground emesis, dizziness, abdominal pain, etc.  He thinks this is related to high-volume postnasal drip.  Suspect benign etiology.  -Avoid non-steroidals -Check CBC but suspect hemoglobin will be stable and normal -Continue over-the-counter Xyzal and consider addition of nasal steroid such as Flonase or Nasacort reduce postnasal drip symptoms -Follow-up immediately for any recurrent hematemesis especially larger volume or if he notices any melena or dizziness.  No orders of the defined types were placed in this encounter.   Follow-up: No follow-ups on file.    Carolann Littler, MD

## 2020-12-21 NOTE — Patient Instructions (Signed)
Hematemesis Hematemesis is when you vomit blood. It is a sign of bleeding in the upper GI tract (gastrointestinal tract). The upper GI tract includes the mouth, throat, esophagus, stomach, and the upper part of the small intestine (duodenum). Hematemesis is usually caused by bleeding in the esophagus or stomach. You may suddenly vomit bright red blood. Or, the blood may look like coffee grounds. You may also have other symptoms, such as:  Stomach pain.  Heartburn.  Stool (feces) that looks black and tarry. Follow these instructions at home:  Take over-the-counter and prescription medicines only as told by your health care provider.  Do not take NSAIDs, including aspirin and ibuprofen, unless your health care provider approves. These medicines can increase bleeding.  Rest as needed.  Drink enough fluids to keep your urine pale yellow. Take small sips of fluid at a time.  Do not drink alcohol.  Do not use any products that contain nicotine or tobacco, such as cigarettes and e-cigarettes. If you need help quitting, ask your health care provider.  Keep all follow-up visits as told by your health care provider. This is important.   Contact a health care provider if:  You have more blood in your vomit.  Your vomiting of blood begins again after it has stopped.  You have persistent stomach pain.  You have nausea, indigestion, or heartburn. Get help right away if:  You faint.  You feel weak or dizzy.  You are urinating less than normal or not at all.  You vomit up: ? Large amounts of blood or dark material that may look like coffee grounds. ? Bright red blood.  You have any of the following: ? Persistent vomiting. ? A rapid heartbeat. ? Blood in your stool. ? Chest pain. ? Difficulty breathing. These symptoms may represent a serious problem that is an emergency. Do not wait to see if the symptoms will go away. Get medical help right away. Call your local emergency services  (911 in the [place]). Do not drive yourself to the hospital. Summary  Hematemesis is when you vomit blood. It is a sign of bleeding in the upper GI tract (gastrointestinal tract).  Hematemesis is usually caused by bleeding in the esophagus or stomach.  Do not take NSAIDs (including aspirin and ibuprofen), drink alcohol, or use tobacco products.  Take over-the-counter and prescription medicines only as told by your health care provider. This information is not intended to replace advice given to you by your health care provider. Make sure you discuss any questions you have with your health care provider. Document Revised: 09/21/2017 Document Reviewed: 09/21/2017 Elsevier Patient Education  2021 [employer].

## 2020-12-23 ENCOUNTER — Other Ambulatory Visit: Payer: Self-pay

## 2020-12-24 ENCOUNTER — Encounter: Payer: Self-pay | Admitting: Family Medicine

## 2020-12-24 ENCOUNTER — Ambulatory Visit: Payer: 59 | Admitting: Family Medicine

## 2020-12-24 VITALS — BP 124/70 | HR 86 | Temp 98.4°F | Ht 78.0 in | Wt 340.0 lb

## 2020-12-24 DIAGNOSIS — F419 Anxiety disorder, unspecified: Secondary | ICD-10-CM

## 2020-12-24 DIAGNOSIS — N529 Male erectile dysfunction, unspecified: Secondary | ICD-10-CM | POA: Diagnosis not present

## 2020-12-24 MED ORDER — SILDENAFIL CITRATE 100 MG PO TABS: 50.0000 mg | ORAL_TABLET | Freq: Every day | ORAL | 11 refills | Status: DC | PRN

## 2020-12-24 NOTE — Progress Notes (Signed)
Established Patient Office Visit  Subjective:  Patient ID: Bradley [name], male    DOB: July 10, 1996  Age: 25 y.o. MRN: [id number]  CC:  Chief Complaint  Patient presents with  . New Patient (Initial Visit)    HPI Bradley [name] presents for establishing care.  He has actually been seen here couple times recently for acute issues.  His primary provider as left previous practice and so he had requested coming here and we agreed.  Past medical history reviewed.  He has history of migraine headaches and these are fortunately infrequent.  He has history of mild intermittent asthma.  He has history of seasonal and perennial allergic rhinitis.  Has had history of GERD.  He has had hives previously with food ingestion with shellfish and red meat but no true anaphylaxis.  He is followed by allergist.  He does have some issues with erectile dysfunction.  He states his libido is good.  He states he had previous testosterone level of 350.  He has a steady girlfriend.  He can get erection but has difficulty sometimes maintaining.  Does take Celexa but again has fairly good libido.  Non-smoker.  Past Medical History:  Diagnosis Date  . Anxiety   . Chronic headaches   . Clavicular fracture   . History of chickenpox   . Mild intermittent asthma 06/07/2018  . Shingles    25 years old.  Marland Kitchen Urticaria 05/18/2020    Past Surgical History:  Procedure Laterality Date  . COLONOSCOPY    . CYSTECTOMY  01/2019   scrotum area   . ESOPHAGEAL MANOMETRY N/A 06/09/2020   Procedure: ESOPHAGEAL MANOMETRY (EM);  Surgeon: Lavena Bullion, DO;  Location: [facility];  Service: Gastroenterology;  Laterality: N/A;  . ESOPHAGOGASTRODUODENOSCOPY    . shoulder surgery Right 08/26/2019  . WISDOM TOOTH EXTRACTION      Family History  Problem Relation Age of Onset  . Hypertension Mother   . Colon polyps Mother        might be mistakrn  . Diabetes Father        type II  . Migraines Sister   . Colon cancer Neg  Hx   . Esophageal cancer Neg Hx   . Rectal cancer Neg Hx   . Stomach cancer Neg Hx     Social History   Socioeconomic History  . Marital status: Single    Spouse name: Not on file  . Number of children: 0  . Years of education: College  . Highest education level: Not on file  Occupational History  . Occupation: Sales executive  Tobacco Use  . Smoking status: Never Smoker  . Smokeless tobacco: Never Used  Vaping Use  . Vaping Use: Never used  Substance and Sexual Activity  . Alcohol use: Yes  . Drug use: Yes    Types: Marijuana    Comment: rare use  . Sexual activity: Yes    Partners: Female  Other Topics Concern  . Not on file  Social History Narrative   Lives alone   Caffeine use: 1 cup coffee per day   Right handed    Social Determinants of Health   Financial Resource Strain: Not on file  Food Insecurity: Not on file  Transportation Needs: Not on file  Physical Activity: Not on file  Stress: Not on file  Social Connections: Not on file  Intimate Partner Violence: Not on file    Outpatient Medications Prior to Visit  Medication Sig Dispense Refill  .  citalopram (CELEXA) 20 MG tablet Take 1 tablet (20 mg total) by mouth daily. 90 tablet 2  . levocetirizine (XYZAL) 5 MG tablet Take 5 mg by mouth every evening.     No facility-administered medications prior to visit.    Allergies  Allergen Reactions  . Shellfish Allergy Anaphylaxis  . Hydrocodone Itching    Pt states he feels itchy after taking, but it doesn't preclude him taking hydrocodone if needed About 2-3 years ago.  It was the first time he took hydrocodone.  . Other Diarrhea    Slightly allergic to red meat since tick bite.Marland KitchenMarland KitchenNot anaphylaxis, but GI sensitivity.    ROS Review of Systems  Constitutional: Negative for fatigue and unexpected weight change.  Eyes: Negative for visual disturbance.  Respiratory: Negative for cough, chest tightness and shortness of breath.   Cardiovascular: Negative for  chest pain, palpitations and leg swelling.  Neurological: Negative for dizziness, syncope, weakness, light-headedness and headaches.      Objective:    Physical Exam Constitutional:      Appearance: He is well-developed.  HENT:     Right Ear: External ear normal.     Left Ear: External ear normal.  Eyes:     Pupils: Pupils are equal, round, and reactive to light.  Neck:     Thyroid: No thyromegaly.  Cardiovascular:     Rate and Rhythm: Normal rate and regular rhythm.  Pulmonary:     Effort: Pulmonary effort is normal. No respiratory distress.     Breath sounds: Normal breath sounds. No wheezing or rales.  Musculoskeletal:     Cervical back: Neck supple.  Neurological:     Mental Status: He is alert and oriented to person, place, and time.     BP 124/70 (BP Location: Left Arm, Patient Position: Sitting, Cuff Size: Normal)   Pulse 86   Temp 98.4 F (36.9 C) (Oral)   Ht 6\' 6"  (1.981 m)   Wt (!) 340 lb (154.2 kg)   SpO2 98%   BMI 39.29 kg/m  Wt Readings from Last 3 Encounters:  12/24/20 (!) 340 lb (154.2 kg)  12/21/20 (!) 338 lb 9.6 oz (153.6 kg)  12/16/20 (!) 330 lb (149.7 kg)     Health Maintenance Due  Topic Date Due  . Hepatitis C Screening  Never done  . HPV VACCINES (2 - Male 3-dose series) 07/12/2018  . COVID-19 Vaccine (3 - Booster for Pfizer series) 06/29/2020       Topic Date Due  . HPV VACCINES (2 - Male 3-dose series) 07/12/2018    Lab Results  Component Value Date   TSH 1.120 05/20/2020   Lab Results  Component Value Date   WBC 8.2 12/21/2020   HGB 15.8 12/21/2020   HCT 47.3 12/21/2020   MCV 90.6 12/21/2020   PLT 194.0 12/21/2020   Lab Results  Component Value Date   NA 137 06/30/2020   K 3.9 06/30/2020   CO2 27 06/30/2020   GLUCOSE 88 06/30/2020   BUN 13 06/30/2020   CREATININE 0.75 06/30/2020   BILITOT 0.6 06/30/2020   ALKPHOS 62 06/30/2020   AST 25 06/30/2020   ALT 36 06/30/2020   PROT 6.6 06/30/2020   ALBUMIN 4.3  06/30/2020   CALCIUM 8.9 06/30/2020   ANIONGAP 11 10/16/2019   GFR 127.49 06/30/2020   Lab Results  Component Value Date   CHOL 132 01/21/2020   Lab Results  Component Value Date   HDL 34.00 (L) 01/21/2020   Lab Results  Component Value Date   LDLCALC 82 01/21/2020   Lab Results  Component Value Date   TRIG 79.0 01/21/2020   Lab Results  Component Value Date   CHOLHDL 4 01/21/2020   Lab Results  Component Value Date   HGBA1C 5.0 01/21/2020      Assessment & Plan:   #1 erectile dysfunction.  Low clinical suspicion for vascular issue.  Patient is non-smoker.  Previous testosterone reported 350.  On Celexa but has fairly good libido. -He requested prescription for Viagra.  He has no contraindications.  Hopefully this can be for short-term use only.  He will try 1/2 to 1 tablet as needed  #2 history of mild intermittent asthma currently stable  #3 history of chronic anxiety stable on Celexa 20 mg daily -Continue current dose of Celexa   Meds ordered this encounter  Medications  . sildenafil (VIAGRA) 100 MG tablet    Sig: Take 0.5-1 tablets (50-100 mg total) by mouth daily as needed for erectile dysfunction.    Dispense:  10 tablet    Refill:  11    Follow-up: No follow-ups on file.    Carolann Littler, MD

## 2020-12-30 ENCOUNTER — Encounter: Payer: Self-pay | Admitting: Allergy

## 2020-12-30 LAB — ALPHA-GAL PANEL
Allergen Lamb IgE: 0.3 kU/L — AB
Beef IgE: 0.36 kU/L — AB
IgE (Immunoglobulin E), Serum: 224 IU/mL (ref 6–495)
O215-IgE Alpha-Gal: 0.53 kU/L — AB
Pork IgE: 0.23 kU/L — AB

## 2020-12-30 NOTE — Telephone Encounter (Signed)
Patient notified that, while his alpha gal levels appear to be trending downward, his alpha gal levels remain high. Recommended that he avoid mammalian products and have access to an epinephrine auto-injector set. We can redraw the labs again in 6-12 months if interested.

## 2020-12-31 IMAGING — NM NM HEPATO W/GB/PHARM/[PERSON_NAME]
2 series · 12 of 12 positions shown · non-contrast
Comparison: None.

CLINICAL DATA: Nausea and vomiting with upper abdominal pain

EXAM:
NUCLEAR MEDICINE HEPATOBILIARY IMAGING WITH GALLBLADDER EF
VIEWS:
Anterior right upper quadrant
RADIOPHARMACEUTICALS:  5.0 mCi [id number]  Choletec IV

[Series 1: gbef · 3.28mm/px · 6 of 60 frames shown]
[frame 6/60]
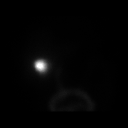
[frame 16/60]
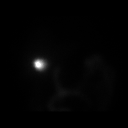
[frame 26/60]
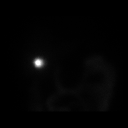
[frame 36/60]
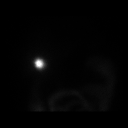
[frame 46/60]
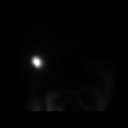
[frame 56/60]
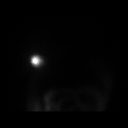

[Series 1: hida scan · 3.28mm/px · 6 of 60 frames shown]
[frame 6/60]
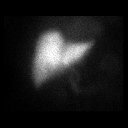
[frame 16/60]
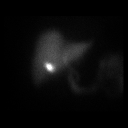
[frame 26/60]
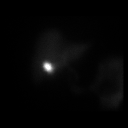
[frame 36/60]
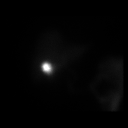
[frame 46/60]
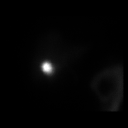
[frame 56/60]
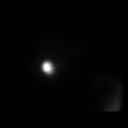

[12 of 12 positions shown; findings below may reference images not displayed]

FINDINGS: Liver uptake of radiotracer is homogeneous. There is prompt
visualization of gallbladder and small bowel, indicating patency of
the cystic and common bile ducts. The patient consumed 8 ounces of
Ensure orally with calculation of the computer generated ejection
fraction of radiotracer from the gallbladder. Patient did not
experience clinical symptoms with the oral Ensure consumption. The
computer generated ejection fraction of radiotracer from the
gallbladder is normal at 58%, normal greater than 33%.
IMPRESSION: Study within normal limits.

## 2021-01-03 ENCOUNTER — Ambulatory Visit: Payer: 59 | Admitting: Nurse Practitioner

## 2021-01-03 ENCOUNTER — Encounter: Payer: Self-pay | Admitting: Nurse Practitioner

## 2021-01-03 VITALS — BP 120/80 | HR 76 | Ht 76.5 in | Wt 338.2 lb

## 2021-01-03 DIAGNOSIS — K2100 Gastro-esophageal reflux disease with esophagitis, without bleeding: Secondary | ICD-10-CM | POA: Diagnosis not present

## 2021-01-03 DIAGNOSIS — K920 Hematemesis: Secondary | ICD-10-CM | POA: Diagnosis not present

## 2021-01-03 NOTE — Patient Instructions (Signed)
If you are age 26 or older, your body mass index should be between 23-30. Your Body mass index is 40.64 kg/m. If this is out of the aforementioned range listed, please consider follow up with your Primary Care Provider.  If you are age 71 or younger, your body mass index should be between 19-25. Your Body mass index is 40.64 kg/m. If this is out of the aformentioned range listed, please consider follow up with your Primary Care Provider.   Follow up as needed.  Thank you for entrusting me with your care and choosing [facility].  Tye Savoy, NP-C

## 2021-01-03 NOTE — Progress Notes (Signed)
ASSESSMENT AND PLAN    # 25 yo male nausea / vomiting and two episodes of emesis containing tiny amount of  " flakes of blood". Following the first episode he was evaluated in urgent care.  Following the second episode he was seen by PCP and hemoglobin was 15.8. No melena. Not clear where 'flakes of blood" originated ( ? Sinus) but low suspicion for upper GI bleed.  --Patient will call if he has additional episodes and or blood in stool/melena  # GERD with pyrosis related to spicy foods.  Patient no longer requiring PPI.  He is managing symptoms with diet and trying to lose weight.   --Anti-reflux measures discussed.  --Continue to work on weight loss   HISTORY OF PRESENT ILLNESS    Chief Complaint : vomited blood  Bradley [name] is a 25 y.o. male known to Dr. Bryan Lemma with a past medical history of obesity, IBS, GERD, colon polyps, headaches, chronic anxiety , and Alpha gal, asthma  Bradley [name] is a 25 year old male here with concerns about recent blood in emesis.  He frequently gets naueated with vomiting in am, sometimes from sinus drainage or even while brushing his teeth.  Two weeks ago while in the shower he vomited a tiny amount of clear fluid containing flecks of blood. He had mild upper abdominal cramping afterwards but thinks it may have been from the vomiting.  He was evaluated at urgent care.  Suspicion was low for GI bleed. . A few days later he had another episode.with same type emesis ( watery with flecks of blood) .  Following this episode patient was evaluated by PCP.  Labs at PCP's office was normal, hgb was 15.8.  He was continued on Xyzal and they discussed adding nasal steroid such as Flonase .  He has not had any further episodes . He hasn't had any blood in his stool / black stools but says stools have been much darker than normal, even this am.  No significant NSAID use  # History of adenomatous colon polyps including a 12 mm cecal adenoma in June 2020 --Patient is on  recall list for 3-year interval colonoscopy  PREVIOUS ENDOSCOPIC EVALUATIONS / PERTINENT STUDIES:    June 2020 EGD  --gastritis  June 2020 colonoscopy for diarrhea --adequate prep --The perianal and digital rectal examinations were normal. - A 12 mm polyp was found in the cecum. The polyp was sessile. The polyp was removedwith a cold snare. Resection and retrieval were complete. Cold forceps were then used on the edges of the polypectomy site for avulsion technique. Estimated blood loss was minimal.- A 4 mm polyp was found in the sigmoid colon. The polyp was sessile. The polyp wasremoved with a cold snare. Resection and retrieval were complete. Estimated blood loss was minimal. - The rectum, descending colon, transverse colon, hepatic flexure and ascending colon allotherwise appeared normal. No areas of mucosal erythema, edema, erosions, or ulcerationnoted. Biopsies for histology were taken with a cold forceps from the right colon and left colon for evaluation of microscopic colitis. Estimated blood loss was minimal. - Retroflexion in the right colon was performed. - The retroflexed view of the distal rectum and anal verge was normal and showed no anal or rectal abnormalities. - The terminal ileum appeared normal  Diagnosis 1. Surgical [P], duodenal - BENIGN DUODENAL MUCOSA - NO ACUTE INFLAMMATION, VILLOUS BLUNTING OR INCREASED INTRAEPITHELIAL LYMPHOCYTES 2. Surgical [P], gastric - BENIGN GASTRIC MUCOSA - NO H. PYLORI, INTESTINAL METAPLASIA OR MALIGNANCY  IDENTIFIED 3. Surgical [P], cecal, polyp - MULTIPLE FRAGMENTS OF TUBULAR ADENOMA(S) - NO HIGH GRADE DYSPLASIA OR MALIGNANCY IDENTIFIED 4. Surgical [P], right colon - BENIGN COLONIC MUCOSA - NO ACTIVE INFLAMMATION OR EVIDENCE OF MICROSCOPIC COLITIS - NO HIGH GRADE DYSPLASIA OR MALIGNANCY IDENTIFIED 5. Surgical [P], left colon - BENIGN COLONIC MUCOSA - NO ACTIVE INFLAMMATION OR EVIDENCE OF MICROSCOPIC COLITIS - NO HIGH GRADE  DYSPLASIA OR MALIGNANCY IDENTIFIED 6. Surgical [P], sigmoid, polyp - TUBULAR ADENOMA (3 OF 3 FRAGMENTS) - NO HIGH GRADE DYSPLASIA OR MALIGNANCY IDENTIFIED   Past Medical History:  Diagnosis Date  . Anxiety   . Chronic headaches   . Clavicular fracture   . History of chickenpox   . Mild intermittent asthma 06/07/2018  . Shingles    25 years old.  Marland Kitchen Urticaria 05/18/2020    Current Medications, Allergies, Past Surgical History, Family History and Social History were reviewed in [employer] record.   Current Outpatient Medications  Medication Sig Dispense Refill  . citalopram (CELEXA) 20 MG tablet Take 1 tablet (20 mg total) by mouth daily. 90 tablet 2  . EPINEPHrine (EPIPEN IJ) Inject 1 Dose as directed as needed.    Marland Kitchen levocetirizine (XYZAL) 5 MG tablet Take 5 mg by mouth every evening.    . sildenafil (VIAGRA) 100 MG tablet Take 0.5-1 tablets (50-100 mg total) by mouth daily as needed for erectile dysfunction. 10 tablet 11   No current facility-administered medications for this visit.    Review of Systems: No chest pain. No shortness of breath. No urinary complaints.   PHYSICAL EXAM :    Wt Readings from Last 3 Encounters:  01/03/21 (!) 338 lb 4 oz (153.4 kg)  12/24/20 (!) 340 lb (154.2 kg)  12/21/20 (!) 338 lb 9.6 oz (153.6 kg)    BP 120/80 (BP Location: Left Arm, Patient Position: Sitting, Cuff Size: Large)   Pulse 76   Ht 6' 4.5" (1.943 m) Comment: height measured without shoes  Wt (!) 338 lb 4 oz (153.4 kg)   BMI 40.64 kg/m  Constitutional:  Pleasant obese male in no acute distress. Psychiatric: Normal mood and affect. Behavior is normal. EENT: Pupils normal.  Conjunctivae are normal. No scleral icterus. Neck supple.  Cardiovascular: Normal rate, regular rhythm. No edema Pulmonary/chest: Effort normal and breath sounds normal. No wheezing, rales or rhonchi. Abdominal: Soft, nondistended, nontender. Bowel sounds active throughout. There are  no masses palpable. No hepatomegaly Rectal: scant amount of light brown, HEME negative stool in vault. Neurological: Alert and oriented to person place and time. Skin: Skin is warm and dry. No rashes noted.  I spent 30 minutes total reviewing records, obtaining history, performing exam, counseling patient and documenting visit / findings.     Tye Savoy, NP  01/03/2021, 3:37 PM

## 2021-01-04 ENCOUNTER — Encounter: Payer: Self-pay | Admitting: Nurse Practitioner

## 2021-01-10 ENCOUNTER — Other Ambulatory Visit: Payer: Self-pay

## 2021-01-10 ENCOUNTER — Ambulatory Visit: Payer: 59 | Admitting: Family Medicine

## 2021-01-10 ENCOUNTER — Encounter: Payer: Self-pay | Admitting: Family Medicine

## 2021-01-10 VITALS — BP 124/64 | HR 69 | Temp 98.3°F | Wt 337.0 lb

## 2021-01-10 DIAGNOSIS — H6981 Other specified disorders of Eustachian tube, right ear: Secondary | ICD-10-CM

## 2021-01-10 NOTE — Patient Instructions (Signed)

## 2021-01-10 NOTE — Progress Notes (Signed)
Established Patient Office Visit  Subjective:  Patient ID: Bradley [name], male    DOB: Feb 22, 1996  Age: 25 y.o. MRN: [id number]  CC:  Chief Complaint  Patient presents with  . Ear Fullness    R ear, x 1 week, seen at [facility] and given drops, no pain, ear pressure. Describes it at having water/fluid stuck in the ear    HPI Bradley [name] presents for sensation of right ear fullness for about a week.  He felt initially may have some water in his ear.  He went to urgent care with [facility] last Tuesday.  Was apparently diagnosed with otitis externa.  No recent swimming.  He was prescribed Floxin drops which has not made much difference.  Has not had any ear drainage.  No hearing loss.  No vertigo.  Does have some nasal congestion intermittently related possibly to allergies.  Past Medical History:  Diagnosis Date  . Anxiety   . Chronic headaches   . Clavicular fracture   . History of chickenpox   . Mild intermittent asthma 06/07/2018  . Shingles    25 years old.  Marland Kitchen Urticaria 05/18/2020    Past Surgical History:  Procedure Laterality Date  . COLONOSCOPY    . CYSTECTOMY  01/2019   scrotum area   . ESOPHAGEAL MANOMETRY N/A 06/09/2020   Procedure: ESOPHAGEAL MANOMETRY (EM);  Surgeon: Lavena Bullion, DO;  Location: [facility];  Service: Gastroenterology;  Laterality: N/A;  . ESOPHAGOGASTRODUODENOSCOPY    . shoulder surgery Right 08/26/2019  . WISDOM TOOTH EXTRACTION      Family History  Problem Relation Age of Onset  . Hypertension Mother   . Colon polyps Mother        might be mistakrn  . Diabetes Father        type II  . Migraines Sister   . Colon cancer Neg Hx   . Esophageal cancer Neg Hx   . Rectal cancer Neg Hx   . Stomach cancer Neg Hx     Social History   Socioeconomic History  . Marital status: Single    Spouse name: Not on file  . Number of children: 0  . Years of education: College  . Highest education level: Not on file  Occupational History  .  Occupation: Sales executive  Tobacco Use  . Smoking status: Never Smoker  . Smokeless tobacco: Never Used  Vaping Use  . Vaping Use: Never used  Substance and Sexual Activity  . Alcohol use: Yes  . Drug use: Yes    Types: Marijuana    Comment: rare use  . Sexual activity: Yes    Partners: Female  Other Topics Concern  . Not on file  Social History Narrative   Lives alone   Caffeine use: 1 cup coffee per day   Right handed    Social Determinants of Health   Financial Resource Strain: Not on file  Food Insecurity: Not on file  Transportation Needs: Not on file  Physical Activity: Not on file  Stress: Not on file  Social Connections: Not on file  Intimate Partner Violence: Not on file    Outpatient Medications Prior to Visit  Medication Sig Dispense Refill  . citalopram (CELEXA) 20 MG tablet Take 1 tablet (20 mg total) by mouth daily. 90 tablet 2  . EPINEPHrine (EPIPEN IJ) Inject 1 Dose as directed as needed.    Marland Kitchen levocetirizine (XYZAL) 5 MG tablet Take 5 mg by mouth every evening.    Marland Kitchen  sildenafil (VIAGRA) 100 MG tablet Take 0.5-1 tablets (50-100 mg total) by mouth daily as needed for erectile dysfunction. 10 tablet 11   No facility-administered medications prior to visit.    Allergies  Allergen Reactions  . Shellfish Allergy Anaphylaxis  . Hydrocodone Itching    Pt states he feels itchy after taking, but it doesn't preclude him taking hydrocodone if needed About 2-3 years ago.  It was the first time he took hydrocodone.  . Other Diarrhea    Slightly allergic to red meat since tick bite.Marland KitchenMarland KitchenNot anaphylaxis, but GI sensitivity.    ROS Review of Systems  Constitutional: Negative for chills and fever.  HENT: Negative for ear discharge, hearing loss, sinus pressure and sinus pain.       Objective:    Physical Exam Vitals reviewed.  Constitutional:      Appearance: Normal appearance.  HENT:     Ears:     Comments: Both external canals reveal some slightly  prominent telangiectasias but no evidence for otitis externa.  No exudate.  Eardrums appear normal.  No obvious effusion. Cardiovascular:     Rate and Rhythm: Normal rate and regular rhythm.  Neurological:     Mental Status: He is alert.     BP 124/64 (BP Location: Left Arm, Patient Position: Sitting, Cuff Size: Normal)   Pulse 69   Temp 98.3 F (36.8 C) (Oral)   Wt (!) 337 lb (152.9 kg)   SpO2 97%   BMI 40.49 kg/m  Wt Readings from Last 3 Encounters:  01/10/21 (!) 337 lb (152.9 kg)  01/03/21 (!) 338 lb 4 oz (153.4 kg)  12/24/20 (!) 340 lb (154.2 kg)     Health Maintenance Due  Topic Date Due  . Hepatitis C Screening  Never done  . HPV VACCINES (2 - Male 3-dose series) 07/12/2018  . COVID-19 Vaccine (3 - Booster for Pfizer series) 06/29/2020       Topic Date Due  . HPV VACCINES (2 - Male 3-dose series) 07/12/2018    Lab Results  Component Value Date   TSH 1.120 05/20/2020   Lab Results  Component Value Date   WBC 8.2 12/21/2020   HGB 15.8 12/21/2020   HCT 47.3 12/21/2020   MCV 90.6 12/21/2020   PLT 194.0 12/21/2020   Lab Results  Component Value Date   NA 137 06/30/2020   K 3.9 06/30/2020   CO2 27 06/30/2020   GLUCOSE 88 06/30/2020   BUN 13 06/30/2020   CREATININE 0.75 06/30/2020   BILITOT 0.6 06/30/2020   ALKPHOS 62 06/30/2020   AST 25 06/30/2020   ALT 36 06/30/2020   PROT 6.6 06/30/2020   ALBUMIN 4.3 06/30/2020   CALCIUM 8.9 06/30/2020   ANIONGAP 11 10/16/2019   GFR 127.49 06/30/2020   Lab Results  Component Value Date   CHOL 132 01/21/2020   Lab Results  Component Value Date   HDL 34.00 (L) 01/21/2020   Lab Results  Component Value Date   LDLCALC 82 01/21/2020   Lab Results  Component Value Date   TRIG 79.0 01/21/2020   Lab Results  Component Value Date   CHOLHDL 4 01/21/2020   Lab Results  Component Value Date   HGBA1C 5.0 01/21/2020      Assessment & Plan:   Probable right eustachian tube dysfunction.  No evidence on  exam for otitis externa, otitis media, or other significant ear abnormality.  Does not have any worrisome symptoms such as acute hearing loss, unilateral tinnitus, etc.  -  Reassurance and observation for now. -Continue over-the-counter medications as needed for allergy symptoms but discussed the fact that medications such as antihistamines do not seem to have significant impact for eustachian tube dysfunction  No orders of the defined types were placed in this encounter.   Follow-up: No follow-ups on file.    Carolann Littler, MD

## 2021-01-14 NOTE — Progress Notes (Signed)
Agree with the assessment and plan as outlined by Paula Guenther, NP. ° °Westley Blass, DO, FACG ° °

## 2021-01-21 ENCOUNTER — Encounter: Payer: Self-pay | Admitting: Emergency Medicine

## 2021-01-21 ENCOUNTER — Encounter: Payer: Self-pay | Admitting: Family Medicine

## 2021-01-21 ENCOUNTER — Other Ambulatory Visit: Payer: Self-pay

## 2021-01-21 ENCOUNTER — Emergency Department
Admission: EM | Admit: 2021-01-21 | Discharge: 2021-01-21 | Disposition: A | Discharge status: Home / Self Care | Payer: 59 | Source: Home / Self Care | Attending: Family Medicine | Admitting: Family Medicine

## 2021-01-21 DIAGNOSIS — S2096XA Insect bite (nonvenomous) of unspecified parts of thorax, initial encounter: Secondary | ICD-10-CM | POA: Diagnosis not present

## 2021-01-21 DIAGNOSIS — W57XXXA Bitten or stung by nonvenomous insect and other nonvenomous arthropods, initial encounter: Secondary | ICD-10-CM | POA: Diagnosis not present

## 2021-01-21 DIAGNOSIS — L089 Local infection of the skin and subcutaneous tissue, unspecified: Secondary | ICD-10-CM

## 2021-01-21 MED ORDER — AMOXICILLIN 500 MG PO CAPS: 500.0000 mg | ORAL_CAPSULE | Freq: Three times a day (TID) | ORAL | 0 refills | Status: DC

## 2021-01-21 NOTE — ED Provider Notes (Signed)
[name]    CSN: [id number] Arrival date & time: 01/21/21  1307      History   Chief Complaint Chief Complaint  Patient presents with  . Tick Removal    HPI DARNELLE CORP is a 25 y.o. male.   Patient discovered a tiny embedded, but not engorged, tick on his left chest 4 days ago.  He was able to remove the tick without difficulty but is concerned that mouth parts may remain.  The site has been pruritic but not painful.  He has been applying Neosporin ointment.  He feels well, denying fever, myalgias, arthralgias, headache, etc.  The history is provided by the patient.    Past Medical History:  Diagnosis Date  . Anxiety   . Chronic headaches   . Clavicular fracture   . History of chickenpox   . Mild intermittent asthma 06/07/2018  . Shingles    25 years old.  Marland Kitchen Urticaria 05/18/2020    Patient Active Problem List   Diagnosis Date Noted  . Right ankle pain 07/06/2020  . Dysphagia   . Gastroesophageal reflux disease 05/19/2020  . Urticaria 05/18/2020  . Seasonal and perennial allergic rhinoconjunctivitis 04/19/2020  . Mild persistent asthma, uncomplicated 29/92/4268  . Low back pain 12/18/2019  . Shortness of breath 10/15/2019  . 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 Hamstring injury, right, initial encounter 01/23/2019  . Migraine without aura and without status migrainosus, not intractable 07/10/2018  . Morbid obesity with BMI of 40.0-44.9, adult ([place]) 07/10/2018  . Mild intermittent asthma without complication 34/19/6222  . Atypical chest pain 12/21/2017  . Generalized abdominal pain 12/17/2017  . Anxiety 11/28/2017    Past Surgical History:  Procedure Laterality Date  . COLONOSCOPY    . CYSTECTOMY  01/2019   scrotum area   . ESOPHAGEAL MANOMETRY N/A 06/09/2020   Procedure: ESOPHAGEAL MANOMETRY (EM);  Surgeon: Lavena Bullion, DO;  Location: [facility];  Service: Gastroenterology;  Laterality: N/A;  .  ESOPHAGOGASTRODUODENOSCOPY    . shoulder surgery Right 08/26/2019  . WISDOM TOOTH EXTRACTION         Home Medications    Prior to Admission medications   Medication Sig Start Date End Date Taking? Authorizing Provider  amoxicillin (AMOXIL) 500 MG capsule Take 1 capsule (500 mg total) by mouth 3 (three) times daily. 01/21/21  Yes Kandra Nicolas, MD  citalopram (CELEXA) 20 MG tablet Take 1 tablet (20 mg total) by mouth daily. 07/26/20  Yes Brunetta Jeans, PA-C  levocetirizine (XYZAL) 5 MG tablet Take 5 mg by mouth every evening.   Yes [provider]  EPINEPHrine (EPIPEN IJ) Inject 1 Dose as directed as needed.    [provider]  sildenafil (VIAGRA) 100 MG tablet Take 0.5-1 tablets (50-100 mg total) by mouth daily as needed for erectile dysfunction. 12/24/20   Burchette, Alinda Sierras, MD    Family History Family History  Problem Relation Age of Onset  . Hypertension Mother   . Colon polyps Mother        might be mistakrn  . Diabetes Father        type II  . Migraines Sister   . Colon cancer Neg Hx   . Esophageal cancer Neg Hx   . Rectal cancer Neg Hx   . Stomach cancer Neg Hx     Social History Social History   Tobacco Use  . Smoking status: Never Smoker  . Smokeless tobacco: Never Used  Vaping Use  . Vaping Use: Never used  Substance Use Topics  . Alcohol use: Yes  . Drug use: Yes    Types: Marijuana    Comment: rare use     Allergies   Shellfish allergy, Hydrocodone, and Other   Review of Systems Review of Systems  Constitutional: Negative for activity change, appetite change, chills, diaphoresis, fatigue and fever.  Musculoskeletal: Negative for arthralgias and myalgias.  Skin: Positive for color change and wound. Negative for rash.  Neurological: Negative for headaches.  All other systems reviewed and are negative.    Physical Exam Triage Vital Signs ED Triage Vitals  Enc Vitals Group     BP 01/21/21 1322 111/76     Pulse Rate  01/21/21 1322 69     Resp 01/21/21 1322 17     Temp 01/21/21 1322 99.1 F (37.3 C)     Temp Source 01/21/21 1322 Oral     SpO2 01/21/21 1322 98 %     Weight --      Height --      Head Circumference --      Peak Flow --      Pain Score 01/21/21 1323 1     Pain Loc --      Pain Edu? --      Excl. in [place]? --    No data found.  Updated Vital Signs BP 111/76 (BP Location: Right Arm)   Pulse 69   Temp 99.1 F (37.3 C) (Oral)   Resp 17   SpO2 98%   Visual Acuity Right Eye Distance:   Left Eye Distance:   Bilateral Distance:    Right Eye Near:   Left Eye Near:    Bilateral Near:     Physical Exam Vitals and nursing note reviewed.  Constitutional:      General: He is not in acute distress. HENT:     Head: Normocephalic.     Mouth/Throat:     Mouth: Mucous membranes are moist.  Eyes:     Pupils: Pupils are equal, round, and reactive to light.  Cardiovascular:     Rate and Rhythm: Normal rate.  Pulmonary:     Effort: Pulmonary effort is normal.  Chest:       Comments: Left anterior chest has a 1cm by 1.5cm slightly raised erythematous area without evidence of tick mouth parts present. Skin:    General: Skin is warm and dry.     Findings: No rash.  Neurological:     Mental Status: He is alert and oriented to person, place, and time.      UC Treatments / Results  Labs (all labs ordered are listed, but only abnormal results are displayed) Labs Reviewed - No data to display  EKG   Radiology No results found.  Procedures Procedures (including critical care time)  Medications Ordered in UC Medications - No data to display  Initial Impression / Assessment and Plan / UC Course  I have reviewed the triage vital signs and the nursing notes.  Pertinent labs & imaging results that were available during my care of the patient were reviewed by me and considered in my medical decision making (see chart for details).    Exam reveals no evidence retained tick  mouth parts.  Tick bite site may have some early cellulitis developing. Patient unable to take doxycycline; begin empiric amoxicillin. Followup with Family Doctor if not improved in one week.    Final Clinical Impressions(s) / UC Diagnoses  Final diagnoses:  Infected tick bite of thoracic region, initial encounter   Discharge Instructions   None    ED Prescriptions    Medication Sig Dispense Auth. Provider   amoxicillin (AMOXIL) 500 MG capsule Take 1 capsule (500 mg total) by mouth 3 (three) times daily. 42 capsule Kandra Nicolas, MD        Kandra Nicolas, MD 01/22/21 2102

## 2021-01-21 NOTE — ED Triage Notes (Signed)
Tick bite on Monday - tick removed  Sent here today by triage nurse at P[facility]  Red raised area on left trunk since Tuesday  Neosporin topical

## 2021-01-21 NOTE — Telephone Encounter (Signed)
Yes this is normal. Nothing to worry about. He can apply Benadryl cream if he wishes

## 2021-01-24 ENCOUNTER — Telehealth: Payer: Self-pay | Admitting: Family Medicine

## 2021-01-24 NOTE — Telephone Encounter (Signed)
The Patient was prescribed amoxicillin (AMOXIL) 500 MG capsule for a tick bite pt states the medication is causing him to have an upset stomach.  The Patient would like to know if he can stop the drug or if he has to continue taking it for 14 days as prescribed. Please advise

## 2021-01-25 NOTE — Telephone Encounter (Signed)
Spoke with the patient he did agree to a follow up and an appointment has been scheduled. Nothing further needed.

## 2021-01-25 NOTE — Telephone Encounter (Signed)
Patient is returning the call, please advise. CB is [phone]

## 2021-01-25 NOTE — Telephone Encounter (Signed)
Left message for patient to call back  

## 2021-01-26 ENCOUNTER — Ambulatory Visit: Payer: 59 | Admitting: Family Medicine

## 2021-01-26 ENCOUNTER — Other Ambulatory Visit: Payer: Self-pay

## 2021-01-26 VITALS — BP 100/62 | HR 60 | Temp 98.4°F | Resp 18 | Ht 78.0 in | Wt 341.4 lb

## 2021-01-26 DIAGNOSIS — W57XXXD Bitten or stung by nonvenomous insect and other nonvenomous arthropods, subsequent encounter: Secondary | ICD-10-CM | POA: Diagnosis not present

## 2021-01-26 DIAGNOSIS — S2096XA Insect bite (nonvenomous) of unspecified parts of thorax, initial encounter: Secondary | ICD-10-CM | POA: Diagnosis not present

## 2021-01-26 NOTE — Progress Notes (Signed)
Established Patient Office Visit  Subjective:  Patient ID: Bradley [name], male    DOB: [birth year]  Age: 25 y.o. MRN: [id number]  CC:  Chief Complaint  Patient presents with  . Follow-up    ER visit for tick bite on 01/21/21. Prescribed Amoxicillin 500 to take TID. Bite is on left side.     HPI  Bradley [name] presents for recent tick bite left waist area.  He went to urgent care.  He did not have any fever or myalgias or arthralgias but apparently was placed on amoxicillin.  He has had some GI upset and had called about whether he could discontinue this.  He has not had any rash to suggest erythema migrans.  No headaches. Area of tick bite left waist is already starting to fade and just slightly pruritic.  Nonpainful.  Past Medical History:  Diagnosis Date  . Anxiety   . Chronic headaches   . Clavicular fracture   . History of chickenpox   . Mild intermittent asthma 06/07/2018  . Shingles    25 years old.  Marland Kitchen Urticaria 05/18/2020    Past Surgical History:  Procedure Laterality Date  . COLONOSCOPY    . CYSTECTOMY  01/2019   scrotum area   . ESOPHAGEAL MANOMETRY N/A 06/09/2020   Procedure: ESOPHAGEAL MANOMETRY (EM);  Surgeon: Lavena Bullion, DO;  Location: [facility];  Service: Gastroenterology;  Laterality: N/A;  . ESOPHAGOGASTRODUODENOSCOPY    . shoulder surgery Right 08/26/2019  . WISDOM TOOTH EXTRACTION      Family History  Problem Relation Age of Onset  . Hypertension Mother   . Colon polyps Mother        might be mistakrn  . Diabetes Father        type II  . Migraines Sister   . Colon cancer Neg Hx   . Esophageal cancer Neg Hx   . Rectal cancer Neg Hx   . Stomach cancer Neg Hx     Social History   Socioeconomic History  . Marital status: Single    Spouse name: Not on file  . Number of children: 0  . Years of education: College  . Highest education level: Not on file  Occupational History  . Occupation: Sales executive  Tobacco Use  . Smoking  status: Never Smoker  . Smokeless tobacco: Never Used  Vaping Use  . Vaping Use: Never used  Substance and Sexual Activity  . Alcohol use: Yes  . Drug use: Yes    Types: Marijuana    Comment: rare use  . Sexual activity: Yes    Partners: Female  Other Topics Concern  . Not on file  Social History Narrative   Lives alone   Caffeine use: 1 cup coffee per day   Right handed    Social Determinants of Health   Financial Resource Strain: Not on file  Food Insecurity: Not on file  Transportation Needs: Not on file  Physical Activity: Not on file  Stress: Not on file  Social Connections: Not on file  Intimate Partner Violence: Not on file    Outpatient Medications Prior to Visit  Medication Sig Dispense Refill  . amoxicillin (AMOXIL) 500 MG capsule Take 1 capsule (500 mg total) by mouth 3 (three) times daily. 42 capsule 0  . citalopram (CELEXA) 20 MG tablet Take 1 tablet (20 mg total) by mouth daily. 90 tablet 2  . EPINEPHrine (EPIPEN IJ) Inject 1 Dose as directed as needed.    Marland Kitchen  levocetirizine (XYZAL) 5 MG tablet Take 5 mg by mouth every evening.    . sildenafil (VIAGRA) 100 MG tablet Take 0.5-1 tablets (50-100 mg total) by mouth daily as needed for erectile dysfunction. 10 tablet 11   No facility-administered medications prior to visit.    Allergies  Allergen Reactions  . Shellfish Allergy Anaphylaxis  . Hydrocodone Itching    Pt states he feels itchy after taking, but it doesn't preclude him taking hydrocodone if needed About 2-3 years ago.  It was the first time he took hydrocodone.  . Other Diarrhea    Slightly allergic to red meat since tick bite.Marland KitchenMarland KitchenNot anaphylaxis, but GI sensitivity.    ROS Review of Systems  Constitutional: Negative for chills and fever.  Respiratory: Negative for shortness of breath.   Cardiovascular: Negative for chest pain.  Neurological: Negative for headaches.  Hematological: Negative for adenopathy.      Objective:    Physical  Exam Vitals reviewed.  Cardiovascular:     Rate and Rhythm: Normal rate and regular rhythm.  Pulmonary:     Effort: Pulmonary effort is normal.     Breath sounds: Normal breath sounds.  Skin:    Comments: Left waist reveals small area of erythema about one half 1/2 cm.  No visible retained foreign tick parts.  No surrounding rash  Neurological:     Mental Status: He is alert.     BP 100/62 (BP Location: Left Arm, Patient Position: Sitting, Cuff Size: Large)   Pulse 60   Temp 98.4 F (36.9 C) (Oral)   Resp 18   Ht 6\' 6"  (1.981 m)   Wt (!) 341 lb 6.4 oz (154.9 kg)   SpO2 97%   BMI 39.45 kg/m  Wt Readings from Last 3 Encounters:  01/26/21 (!) 341 lb 6.4 oz (154.9 kg)  01/10/21 (!) 337 lb (152.9 kg)  01/03/21 (!) 338 lb 4 oz (153.4 kg)     Health Maintenance Due  Topic Date Due  . Hepatitis C Screening  Never done  . HPV VACCINES (2 - Male 3-dose series) 07/12/2018  . COVID-19 Vaccine (3 - Booster for Pfizer series) 06/29/2020       Topic Date Due  . HPV VACCINES (2 - Male 3-dose series) 07/12/2018    Lab Results  Component Value Date   TSH 1.120 05/20/2020   Lab Results  Component Value Date   WBC 8.2 12/21/2020   HGB 15.8 12/21/2020   HCT 47.3 12/21/2020   MCV 90.6 12/21/2020   PLT 194.0 12/21/2020   Lab Results  Component Value Date   NA 137 06/30/2020   K 3.9 06/30/2020   CO2 27 06/30/2020   GLUCOSE 88 06/30/2020   BUN 13 06/30/2020   CREATININE 0.75 06/30/2020   BILITOT 0.6 06/30/2020   ALKPHOS 62 06/30/2020   AST 25 06/30/2020   ALT 36 06/30/2020   PROT 6.6 06/30/2020   ALBUMIN 4.3 06/30/2020   CALCIUM 8.9 06/30/2020   ANIONGAP 11 10/16/2019   GFR 127.49 06/30/2020   Lab Results  Component Value Date   CHOL 132 01/21/2020   Lab Results  Component Value Date   HDL 34.00 (L) 01/21/2020    Lab Results  Component Value Date   TRIG 79.0 01/21/2020   Lab Results  Component Value Date   CHOLHDL 4 01/21/2020   Lab Results  Component  Value Date   HGBA1C 5.0 01/21/2020      Assessment & Plan:   Recent tick bite left  waist with no clinical concerns for Lyme disease.  Patient having intolerance with amoxicillin secondary to GI side effects.  -Recommend DC amoxicillin at this time -Follow-up promptly for any severe arthralgias/myalgias, fever, or other concerns  No orders of the defined types were placed in this encounter.   Follow-up: No follow-ups on file.    Carolann Littler, MD

## 2021-01-26 NOTE — Patient Instructions (Signed)
Tick Bite Information, Adult Ticks are insects that draw blood for food. Most ticks live in shrubs and grassy and wooded areas. They climb onto people and animals that brush against the leaves and grasses that they rest on. Then they bite, attaching themselves to the skin. Most ticks are harmless, but some ticks may carry germs that can spread to a person through a bite and cause a disease. To reduce your risk of getting a disease from a tick bite, make sure you:  Take steps to prevent tick bites.  Check for ticks after being outdoors where ticks live.  Watch for symptoms of disease if a tick attached to you or if you suspect a tick bite. How can I prevent tick bites? Take these steps to help prevent tick bites when you go outdoors in an area where ticks live: Use insect repellent  Use insect repellent that has DEET (20% or higher), picaridin, or IR3535 in it. Follow the instructions on the label. Use these products on: ? Bare skin. ? The top of your boots. ? Your pant legs. ? Your sleeve cuffs.  For insect repellent that contains permethrin, follow the instructions on the label. Use these products on: ? Clothing. ? Boots. ? Outdoor gear. ? Tents. When you are outside  Wear protective clothing. Long sleeves and long pants offer the best protection from ticks.  Wear light-colored clothing so you can see ticks more easily.  Tuck your pant legs into your socks.  If you go walking on a trail, stay in the middle of the trail so your skin, hair, and clothing do not touch the bushes.  Avoid walking through areas with long grass.  Check for ticks on your clothing, hair, and skin often while you are outside, and check again before you go inside. Make sure to check the scalp, neck, armpits, waist, groin, and joint areas. These are the spots where ticks attach themselves most often. When you go indoors  Check your clothing for ticks. Tumble dry clothes in a dryer on high heat for at least  10 minutes. If clothes are damp, additional time may be needed. If clothes require washing, use hot water.  Examine gear and pets.  Shower soon after being outdoors.  Check your body for ticks. Conduct a full body check using a mirror. What is the proper way to remove a tick? If you find a tick on your body, remove it as soon as possible. Removing a tick sooner can prevent germs from passing to your body. Do not remove the tick with your bare fingers. To remove a tick that is crawling on your skin but has not bitten, use either of these methods:  Go outdoors and brush the tick off.  Remove the tick with tape or a lint roller. To remove a tick that is attached to your skin: 1. Wash your hands. If you have latex gloves, put them on. 2. Use fine-tipped tweezers, curved forceps, or a tick-removal tool to gently grasp the tick as close to your skin and the tick's head as possible. 3. Gently pull with a steady, upward, even pressure until the tick lets go. 4. When removing the tick: ? Take care to keep the tick's head attached to its body. ? Do not twist or jerk the tick. This can make the tick's head or mouth parts break off and remain in the skin. ? Do not squeeze or crush the tick's body. This could force disease-carrying fluids from the tick   into your body. Do not try to remove a tick with heat, alcohol, petroleum jelly, or fingernail polish. Using these methods can cause the tick to salivate and regurgitate into your bloodstream, increasing your risk of getting a disease.   What should I do after removing a tick?  Dispose of the tick. Do not crush a tick with your fingers.  Clean the bite area and your hands with soap and water, rubbing alcohol, or an iodine scrub.  If an antiseptic cream or ointment is available, apply a small amount to the bite site.  Wash and disinfect any instruments that you used to remove the tick. How should I dispose of a tick? To dispose of a live tick, use  one of these methods:  Place it in rubbing alcohol.  Place it in a sealed bag or container.  Wrap it tightly in tape.  Flush it down the toilet. Contact a health care provider if:  You have symptoms of a disease after a tick bite. Symptoms of a tick-borne disease can occur from moments after the tick bites to 30 days after a tick is removed. Symptoms include: ? Fever or chills. ? Any of these signs in the bite area:  A red rash that makes a circle (bull's-eye rash) in the bite area.  Redness and swelling. ? Headache. ? Muscle, joint, or bone pain. ? Abnormal tiredness. ? Numbness in your legs or difficulty walking or moving your legs. ? Tender, swollen lymph glands.  A part of a tick breaks off and gets stuck in your skin. Get help right away if:  You are not able to remove a tick.  You experience muscle weakness or paralysis.  Your symptoms get worse or you experience new symptoms.  You find an engorged tick on your skin and you are in an area where disease from ticks is a high risk. Summary  Ticks may carry germs that can spread to a person through a bite and cause a disease.  Wear protective clothing and use insect repellent to prevent tick bites. Follow the instructions on the label.  If you find a tick on your body, remove it as soon as possible. If the tick is attached, do not try to remove with heat, alcohol, petroleum jelly, or fingernail polish.  Remove the attached tick using fine-tipped tweezers, curved forceps, or a tick-removal tool. Gently pull with steady, upward, even pressure until the tick lets go. Do not twist or jerk the tick. Do not squeeze or crush the tick's body.  If you have symptoms of a disease after being bitten by a tick, contact a health care provider. This information is not intended to replace advice given to you by your health care provider. Make sure you discuss any questions you have with your health care provider. Document Revised:  09/08/2019 Document Reviewed: 09/08/2019 Elsevier Patient Education  2021 Elsevier Inc.  

## 2021-02-09 ENCOUNTER — Emergency Department
Admission: EM | Admit: 2021-02-09 | Discharge: 2021-02-09 | Disposition: A | Discharge status: Home / Self Care | Payer: 59 | Source: Home / Self Care

## 2021-02-09 ENCOUNTER — Other Ambulatory Visit: Payer: Self-pay

## 2021-02-09 DIAGNOSIS — R062 Wheezing: Secondary | ICD-10-CM | POA: Diagnosis not present

## 2021-02-09 DIAGNOSIS — J309 Allergic rhinitis, unspecified: Secondary | ICD-10-CM | POA: Diagnosis not present

## 2021-02-09 DIAGNOSIS — R059 Cough, unspecified: Secondary | ICD-10-CM

## 2021-02-09 MED ORDER — PREDNISONE 20 MG PO TABS: ORAL_TABLET | ORAL | 0 refills | Status: DC

## 2021-02-09 MED ORDER — METHYLPREDNISOLONE ACETATE 80 MG/ML IJ SUSP
80.0000 mg | Freq: Once | INTRAMUSCULAR | Status: AC
  Administered 2021-02-09: 80 mg via INTRAMUSCULAR

## 2021-02-09 NOTE — ED Triage Notes (Signed)
Pt presents to Urgent Care with c/o sore throat, cough w/ wheezing, diarrhea, and nasal congestion since yesterday; reports eating something too hot which burned his throat two days ago. No known COVID exposure; vaccinated against COVID, not flu.

## 2021-02-09 NOTE — ED Provider Notes (Signed)
[name]    CSN: [id number] Arrival date & time: 02/09/21  0948      History   Chief Complaint Chief Complaint  Patient presents with  . Sore Throat  . Diarrhea  . Fever    HPI Bradley [name] is a 25 y.o. male.   HPI 25 year old male presents with sore throat, cough with wheezing, diarrhea, and nasal congestion for 2-3 days.  Patient denies recent COVID-19 exposure, has been vaccinated for COVID-19 but not for influenza.  Past Medical History:  Diagnosis Date  . Anxiety   . Chronic headaches   . Clavicular fracture   . History of chickenpox   . Mild intermittent asthma 06/07/2018  . Shingles    25 years old.  Marland Kitchen Urticaria 05/18/2020    Patient Active Problem List   Diagnosis Date Noted  . Right ankle pain 07/06/2020  . Dysphagia   . Gastroesophageal reflux disease 05/19/2020  . Urticaria 05/18/2020  . Seasonal and perennial allergic rhinoconjunctivitis 04/19/2020  . Mild persistent asthma, uncomplicated 41/32/4401  . Low back pain 12/18/2019  . Shortness of breath 10/15/2019  . 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 Hamstring injury, right, initial encounter 01/23/2019  . Migraine without aura and without status migrainosus, not intractable 07/10/2018  . Morbid obesity with BMI of 40.0-44.9, adult ([place]) 07/10/2018  . Mild intermittent asthma without complication 02/72/5366  . Atypical chest pain 12/21/2017  . Generalized abdominal pain 12/17/2017  . Anxiety 11/28/2017    Past Surgical History:  Procedure Laterality Date  . COLONOSCOPY    . CYSTECTOMY  01/2019   scrotum area   . ESOPHAGEAL MANOMETRY N/A 06/09/2020   Procedure: ESOPHAGEAL MANOMETRY (EM);  Surgeon: Lavena Bullion, DO;  Location: [facility];  Service: Gastroenterology;  Laterality: N/A;  . ESOPHAGOGASTRODUODENOSCOPY    . shoulder surgery Right 08/26/2019  . WISDOM TOOTH EXTRACTION         Home Medications    Prior to Admission medications    Medication Sig Start Date End Date Taking? Authorizing Provider  predniSONE (DELTASONE) 20 MG tablet Take 3 tabs PO daily x 5 days. 02/09/21  Yes Eliezer Lofts, FNP  amoxicillin (AMOXIL) 500 MG capsule Take 1 capsule (500 mg total) by mouth 3 (three) times daily. 01/21/21   Kandra Nicolas, MD  citalopram (CELEXA) 20 MG tablet Take 1 tablet (20 mg total) by mouth daily. 07/26/20   Brunetta Jeans, PA-C  EPINEPHrine (EPIPEN IJ) Inject 1 Dose as directed as needed.    [provider]  sildenafil (VIAGRA) 100 MG tablet Take 0.5-1 tablets (50-100 mg total) by mouth daily as needed for erectile dysfunction. 12/24/20   Burchette, Alinda Sierras, MD    Family History Family History  Problem Relation Age of Onset  . Hypertension Mother   . Colon polyps Mother        might be mistakrn  . Diabetes Father        type II  . Migraines Sister   . Colon cancer Neg Hx   . Esophageal cancer Neg Hx   . Rectal cancer Neg Hx   . Stomach cancer Neg Hx     Social History Social History   Tobacco Use  . Smoking status: Never Smoker  . Smokeless tobacco: Never Used  Vaping Use  . Vaping Use: Never used  Substance Use Topics  . Alcohol use: Yes    Alcohol/week: 3.0 standard drinks    Types: 3 Standard drinks  or equivalent per week    Comment: weekly  . Drug use: Yes    Types: Marijuana    Comment: rare use     Allergies   Shellfish allergy, Hydrocodone, and Other   Review of Systems Review of Systems  Constitutional: Negative.   HENT: Positive for congestion and sore throat.   Respiratory: Positive for cough and wheezing.   Cardiovascular: Negative.   Gastrointestinal: Positive for diarrhea.  Genitourinary: Negative.   Musculoskeletal: Negative.   Skin: Negative.   Neurological: Negative.      Physical Exam Triage Vital Signs ED Triage Vitals  Enc Vitals Group     BP 02/09/21 1003 122/83     Pulse Rate 02/09/21 1003 (!) 101     Resp 02/09/21 1003 20     Temp 02/09/21 1003  99.1 F (37.3 C)     Temp Source 02/09/21 1003 Oral     SpO2 02/09/21 1003 95 %     Weight 02/09/21 0959 (!) 335 lb (152 kg)     Height 02/09/21 0959 6\' 6"  (1.981 m)     Head Circumference --      Peak Flow --      Pain Score 02/09/21 0959 4     Pain Loc --      Pain Edu? --      Excl. in [place]? --    No data found.  Updated Vital Signs BP 122/83 (BP Location: Right Arm)   Pulse (!) 101   Temp 99.1 F (37.3 C) (Oral)   Resp 20   Ht 6\' 6"  (1.981 m)   Wt (!) 335 lb (152 kg)   SpO2 95%   BMI 38.71 kg/m      Physical Exam Vitals and nursing note reviewed.  Constitutional:      General: He is not in acute distress.    Appearance: He is well-developed. He is obese. He is not ill-appearing.  HENT:     Head: Normocephalic and atraumatic.     Right Ear: Tympanic membrane and ear canal normal.     Left Ear: Tympanic membrane and ear canal normal.     Nose: No congestion or rhinorrhea.     Mouth/Throat:     Mouth: Mucous membranes are moist.     Pharynx: Oropharynx is clear. Uvula midline. No oropharyngeal exudate, posterior oropharyngeal erythema or uvula swelling.     Tonsils: No tonsillar exudate or tonsillar abscesses.     Comments: Moderate clear drainage noted of posterior oropharynx Eyes:     Conjunctiva/sclera: Conjunctivae normal.     Pupils: Pupils are equal, round, and reactive to light.  Neck:     Thyroid: No thyromegaly.  Cardiovascular:     Rate and Rhythm: Regular rhythm. Tachycardia present.     Heart sounds: Normal heart sounds. No murmur heard.   Pulmonary:     Effort: Pulmonary effort is normal. No respiratory distress.     Breath sounds: Normal breath sounds. No rhonchi or rales.     Comments: Subtle expiratory wheeze noted of the left apex and left upper airways, infrequent nonproductive cough noted Musculoskeletal:     Cervical back: Normal range of motion and neck supple.  Lymphadenopathy:     Cervical: No cervical adenopathy.  Skin:    General:  Skin is warm and dry.  Neurological:     General: No focal deficit present.     Mental Status: He is alert and oriented to person, place, and time.  Psychiatric:  Mood and Affect: Mood normal.        Behavior: Behavior normal.      UC Treatments / Results  Labs (all labs ordered are listed, but only abnormal results are displayed) Labs Reviewed - No data to display  EKG   Radiology No results found.  Procedures Procedures (including critical care time)  Medications Ordered in UC Medications  methylPREDNISolone acetate (DEPO-MEDROL) injection 80 mg (80 mg Intramuscular Given 02/09/21 1143)    Initial Impression / Assessment and Plan / UC Course  I have reviewed the triage vital signs and the nursing notes.  Pertinent labs & imaging results that were available during my care of the patient were reviewed by me and considered in my medical decision making (see chart for details).     MDM: 1. Cough, 2. Wheezes, 3. Allergic Rhinitis.  Patient discharged home, hemodynamically stable. COVID-19/Flu A&B ordered. Final Clinical Impressions(s) / UC Diagnoses   Final diagnoses:  Cough  Wheezes  Allergic rhinitis, unspecified seasonality, unspecified trigger     Discharge Instructions     Advised/instructed patient to start oral prednisone burst tomorrow, Thursday, 02/10/2021.  Advised patient please take medication as directed with food to completion.  Advised/encouraged patient to discontinue daily Xyzal and start Allegra 180 mg without D for the next 5 to 7 days, then as needed    ED Prescriptions    Medication Sig Dispense Auth. Provider   predniSONE (DELTASONE) 20 MG tablet Take 3 tabs PO daily x 5 days. 15 tablet Eliezer Lofts, FNP     PDMP not reviewed this encounter.   Eliezer Lofts, [place] 02/09/21 1309

## 2021-02-09 NOTE — Discharge Instructions (Addendum)
Advised/instructed patient to start oral prednisone burst tomorrow, Thursday, 02/10/2021.  Advised patient please take medication as directed with food to completion.  Advised/encouraged patient to discontinue daily Xyzal and start Allegra 180 mg without D for the next 5 to 7 days, then as needed

## 2021-02-11 ENCOUNTER — Emergency Department
Admission: EM | Admit: 2021-02-11 | Discharge: 2021-02-11 | Disposition: A | Discharge status: Home / Self Care | Payer: 59 | Source: Home / Self Care

## 2021-02-11 ENCOUNTER — Encounter: Payer: Self-pay | Admitting: Emergency Medicine

## 2021-02-11 ENCOUNTER — Other Ambulatory Visit: Payer: Self-pay

## 2021-02-11 DIAGNOSIS — J069 Acute upper respiratory infection, unspecified: Secondary | ICD-10-CM

## 2021-02-11 DIAGNOSIS — H6691 Otitis media, unspecified, right ear: Secondary | ICD-10-CM | POA: Diagnosis not present

## 2021-02-11 MED ORDER — AMOXICILLIN-POT CLAVULANATE 875-125 MG PO TABS: 1.0000 | ORAL_TABLET | Freq: Two times a day (BID) | ORAL | 0 refills | Status: DC

## 2021-02-11 NOTE — ED Provider Notes (Signed)
[name]    CSN: [id number] Arrival date & time: 02/11/21  1142      History   Chief Complaint Chief Complaint  Patient presents with  . Otalgia    HPI Bradley [name] is a 25 y.o. male.   HPI 25 year old male presents for right ear pain for 1 day.  Reports that his productive cough has improved with prednisone which she is currently still taking.  Past Medical History:  Diagnosis Date  . Anxiety   . Chronic headaches   . Clavicular fracture   . History of chickenpox   . Mild intermittent asthma 06/07/2018  . Shingles    25 years old.  Marland Kitchen Urticaria 05/18/2020    Patient Active Problem List   Diagnosis Date Noted  . Right ankle pain 07/06/2020  . Dysphagia   . Gastroesophageal reflux disease 05/19/2020  . Urticaria 05/18/2020  . Seasonal and perennial allergic rhinoconjunctivitis 04/19/2020  . Mild persistent asthma, uncomplicated 32/99/2426  . Low back pain 12/18/2019  . Shortness of breath 10/15/2019  . 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 Hamstring injury, right, initial encounter 01/23/2019  . Migraine without aura and without status migrainosus, not intractable 07/10/2018  . Morbid obesity with BMI of 40.0-44.9, adult ([place]) 07/10/2018  . Mild intermittent asthma without complication 83/41/9622  . Atypical chest pain 12/21/2017  . Generalized abdominal pain 12/17/2017  . Anxiety 11/28/2017    Past Surgical History:  Procedure Laterality Date  . COLONOSCOPY    . CYSTECTOMY  01/2019   scrotum area   . ESOPHAGEAL MANOMETRY N/A 06/09/2020   Procedure: ESOPHAGEAL MANOMETRY (EM);  Surgeon: Lavena Bullion, DO;  Location: [facility];  Service: Gastroenterology;  Laterality: N/A;  . ESOPHAGOGASTRODUODENOSCOPY    . shoulder surgery Right 08/26/2019  . WISDOM TOOTH EXTRACTION         Home Medications    Prior to Admission medications   Medication Sig Start Date End Date Taking? Authorizing Provider   amoxicillin-clavulanate (AUGMENTIN) 875-125 MG tablet Take 1 tablet by mouth every 12 (twelve) hours. 02/11/21  Yes Eliezer Lofts, FNP  citalopram (CELEXA) 20 MG tablet Take 1 tablet (20 mg total) by mouth daily. 07/26/20  Yes Brunetta Jeans, PA-C  EPINEPHrine (EPIPEN IJ) Inject 1 Dose as directed as needed.   Yes [provider]  predniSONE (DELTASONE) 20 MG tablet Take 3 tabs PO daily x 5 days. 02/09/21  Yes Eliezer Lofts, FNP  sildenafil (VIAGRA) 100 MG tablet Take 0.5-1 tablets (50-100 mg total) by mouth daily as needed for erectile dysfunction. 12/24/20  Yes Burchette, Alinda Sierras, MD    Family History Family History  Problem Relation Age of Onset  . Hypertension Mother   . Colon polyps Mother        might be mistakrn  . Diabetes Father        type II  . Migraines Sister   . Colon cancer Neg Hx   . Esophageal cancer Neg Hx   . Rectal cancer Neg Hx   . Stomach cancer Neg Hx     Social History Social History   Tobacco Use  . Smoking status: Never Smoker  . Smokeless tobacco: Never Used  Vaping Use  . Vaping Use: Never used  Substance Use Topics  . Alcohol use: Yes    Alcohol/week: 3.0 standard drinks    Types: 3 Standard drinks or equivalent per week    Comment: weekly  . Drug use: Yes  Types: Marijuana    Comment: rare use     Allergies   Shellfish allergy, Hydrocodone, and Other   Review of Systems Review of Systems  Constitutional: Negative.   HENT: Positive for ear pain.   Eyes: Negative.   Respiratory: Negative.   Cardiovascular: Negative.   Gastrointestinal: Negative.   Genitourinary: Negative.   Musculoskeletal: Negative.   Skin: Negative.   Neurological: Negative.      Physical Exam Triage Vital Signs ED Triage Vitals  Enc Vitals Group     BP 02/11/21 1302 133/88     Pulse Rate 02/11/21 1302 86     Resp --      Temp 02/11/21 1302 99.9 F (37.7 C)     Temp Source 02/11/21 1302 Oral     SpO2 02/11/21 1302 99 %     Weight --       Height --      Head Circumference --      Peak Flow --      Pain Score 02/11/21 1303 5     Pain Loc --      Pain Edu? --      Excl. in [place]? --    No data found.  Updated Vital Signs BP 133/88 (BP Location: Left Arm)   Pulse 86   Temp 99.9 F (37.7 C) (Oral)   SpO2 99%      Physical Exam Vitals and nursing note reviewed.  Constitutional:      General: He is not in acute distress.    Appearance: He is obese. He is not ill-appearing.  HENT:     Head: Normocephalic and atraumatic.     Right Ear: Hearing and ear canal normal. Tympanic membrane is erythematous and retracted.     Left Ear: Hearing and ear canal normal. Tympanic membrane is retracted.     Nose:     Right Turbinates: Enlarged.     Left Turbinates: Enlarged.     Right Sinus: Maxillary sinus tenderness present.     Left Sinus: Maxillary sinus tenderness present.     Comments: Turbinates are erythematous    Mouth/Throat:     Lips: Pink.     Pharynx: Oropharynx is clear. Uvula midline. No pharyngeal swelling or posterior oropharyngeal erythema.  Cardiovascular:     Rate and Rhythm: Normal rate and regular rhythm.     Pulses: Normal pulses.     Heart sounds: Normal heart sounds. No murmur heard.   Pulmonary:     Effort: Pulmonary effort is normal.     Breath sounds: Normal breath sounds.     Comments: No adventitious breath sounds noted Musculoskeletal:     Cervical back: Normal range of motion and neck supple. No tenderness.  Lymphadenopathy:     Cervical: Cervical adenopathy present.  Neurological:     General: No focal deficit present.     Mental Status: He is alert and oriented to person, place, and time.  Psychiatric:        Mood and Affect: Mood normal.        Behavior: Behavior normal.      UC Treatments / Results  Labs (all labs ordered are listed, but only abnormal results are displayed) Labs Reviewed  COVID-19, FLU A+B NAA    EKG   Radiology No results  found.  Procedures Procedures (including critical care time)  Medications Ordered in UC Medications - No data to display  Initial Impression / Assessment and Plan / UC Course  I  have reviewed the triage vital signs and the nursing notes.  Pertinent labs & imaging results that were available during my care of the patient were reviewed by me and considered in my medical decision making (see chart for details).     MDM: 1.  Right acute otitis media, 2.  URI.  COVID-19/flu A&B ordered.  Patient discharged home, hemodynamically stable. Final Clinical Impressions(s) / UC Diagnoses   Final diagnoses:  Acute upper respiratory infection  Right acute otitis media     Discharge Instructions     Advised/instructed patient to take medication with food to completion.  Encourage patient increase daily water intake while taking medication.    ED Prescriptions    Medication Sig Dispense Auth. Provider   amoxicillin-clavulanate (AUGMENTIN) 875-125 MG tablet Take 1 tablet by mouth every 12 (twelve) hours. 14 tablet Eliezer Lofts, FNP     PDMP not reviewed this encounter.   Eliezer Lofts, [place] 02/11/21 [phone]

## 2021-02-11 NOTE — ED Triage Notes (Signed)
Patient c/o right ear pain x 1 day, productive cough, congestion, runny nose.  Patient is currently on Prednisone from Wednesday's visit.

## 2021-02-11 NOTE — Discharge Instructions (Addendum)
Advised/instructed patient to take medication with food to completion.  Encourage patient increase daily water intake while taking medication.

## 2021-02-13 LAB — COVID-19, FLU A+B NAA
Influenza A, NAA: NOT DETECTED
Influenza B, NAA: NOT DETECTED
SARS-CoV-2, NAA: NOT DETECTED

## 2021-02-16 ENCOUNTER — Encounter: Payer: Self-pay | Admitting: Family Medicine

## 2021-03-16 DIAGNOSIS — K602 Anal fissure, unspecified: Secondary | ICD-10-CM | POA: Insufficient documentation

## 2021-03-22 ENCOUNTER — Other Ambulatory Visit: Payer: Self-pay

## 2021-03-22 ENCOUNTER — Ambulatory Visit: Payer: 59 | Admitting: Family Medicine

## 2021-03-22 ENCOUNTER — Encounter: Payer: Self-pay | Admitting: Family Medicine

## 2021-03-22 VITALS — BP 112/80 | HR 67 | Temp 98.8°F | Wt 349.0 lb

## 2021-03-22 DIAGNOSIS — S91112A Laceration without foreign body of left great toe without damage to nail, initial encounter: Secondary | ICD-10-CM | POA: Diagnosis not present

## 2021-03-22 NOTE — Progress Notes (Signed)
   Subjective:    Patient ID: Bradley [name], male    DOB: 12-29-1995, 25 y.o.   MRN: [id number]  HPI Here to check his left great toe after an injury at home that occurred 3 days ago. While walking barefoot his toe brushed against an HVAC vent on the floor and a piece of wood broke off inside it. He was able to pull the splinter out. He cleaned it initially with peroxide  and since then he has been dressing it with Neosporin and a Bandaid. He asks [place] it there is anything else left under the skin.   Review of Systems  Constitutional: Negative.   Respiratory: Negative.    Cardiovascular: Negative.   Skin:  Positive for wound.      Objective:   Physical Exam Constitutional:      General: He is not in acute distress.    Appearance: Normal appearance.  Cardiovascular:     Rate and Rhythm: Normal rate and regular rhythm.     Pulses: Normal pulses.     Heart sounds: Normal heart sounds.  Pulmonary:     Effort: Pulmonary effort is normal.     Breath sounds: Normal breath sounds.  Skin:    Comments: There is a small superficial laceration on the left great toe which is slightly tender. This is clean with no erythema. No residual material is felt beneath the skin  Neurological:     Mental Status: He is alert.          Assessment & Plan:  Toe laceration. He has successfully removed the entire piece of wood from the toe. He will continue to dress it as above for a few more days. Recheck as needed.  Alysia Penna, MD

## 2021-04-27 ENCOUNTER — Encounter: Payer: Self-pay | Admitting: Family Medicine

## 2021-04-27 ENCOUNTER — Other Ambulatory Visit: Payer: Self-pay | Admitting: Family Medicine

## 2021-04-27 MED ORDER — CLONAZEPAM 0.5 MG PO TABS: 0.5000 mg | ORAL_TABLET | Freq: Two times a day (BID) | ORAL | 0 refills | Status: DC | PRN

## 2021-04-27 MED ORDER — CITALOPRAM HYDROBROMIDE 20 MG PO TABS
20.0000 mg | ORAL_TABLET | Freq: Every day | ORAL | 1 refills | Status: DC
Start: 2021-04-27 — End: 2021-12-12

## 2021-04-27 NOTE — Addendum Note (Signed)
Addended by: Eulas Post on: 04/27/2021 03:26 PM   Modules accepted: Orders

## 2021-04-27 NOTE — Telephone Encounter (Signed)
No problem. I will send in limited supply of Clonazepam and limit use only to severe anxiety episodes.

## 2021-04-27 NOTE — Telephone Encounter (Signed)
Please advise. The refill request for citalopram has been sent to you and should be in your basket.

## 2021-05-09 ENCOUNTER — Other Ambulatory Visit: Payer: Self-pay | Admitting: *Deleted

## 2021-05-09 ENCOUNTER — Encounter: Payer: Self-pay | Admitting: Allergy

## 2021-05-09 MED ORDER — PROAIR HFA 108 (90 BASE) MCG/ACT IN AERS: 2.0000 | INHALATION_SPRAY | RESPIRATORY_TRACT | 0 refills | Status: DC | PRN

## 2021-05-09 MED ORDER — ALBUTEROL SULFATE HFA 108 (90 BASE) MCG/ACT IN AERS: 2.0000 | INHALATION_SPRAY | Freq: Four times a day (QID) | RESPIRATORY_TRACT | 0 refills | Status: DC | PRN

## 2021-05-27 ENCOUNTER — Other Ambulatory Visit: Payer: Self-pay

## 2021-05-27 ENCOUNTER — Encounter: Payer: Self-pay | Admitting: Family Medicine

## 2021-05-27 ENCOUNTER — Ambulatory Visit: Payer: 59 | Admitting: Family Medicine

## 2021-05-27 VITALS — BP 128/80 | HR 84 | Temp 98.3°F | Ht 78.0 in | Wt 357.4 lb

## 2021-05-27 DIAGNOSIS — K9289 Other specified diseases of the digestive system: Secondary | ICD-10-CM | POA: Diagnosis not present

## 2021-05-27 NOTE — Progress Notes (Signed)
Established Patient Office Visit  Subjective:  Patient ID: Bradley [name], male    DOB: 02-18-1996  Age: 25 y.o. MRN: [id number]  CC:  Chief Complaint  Patient presents with   Gastroesophageal Reflux    X 3 days, a lot of burping, has taken gas x but didn't seem to help. Also having a dull stomach pain    HPI Bradley [name] presents for 3-day history of increased burping and some migratory transient abdominal pain.  He feels like this is probably related to increased gas symptoms.  Denies any recent major dietary changes.  No nausea or vomiting.  No fever.  No localizing abdominal pain.  No change in stool habits.  Symptoms usually relieved some with burping.  Denies any active reflux symptoms.  Appetite and weight stable.  Does not chew gum regularly.  No regular sodas or other carbonated beverages.  He had studies of the gallbladder last year including ultrasound and nuclear medicine hepatobiliary imaging with gallbladder ejection fraction and this was also normal.  No recent right upper quadrant abdominal pain.  Wt Readings from Last 3 Encounters:  05/27/21 (!) 357 lb 7 oz (162.1 kg)  03/22/21 (!) 349 lb (158.3 kg)  02/09/21 (!) 335 lb (152 kg)     Past Medical History:  Diagnosis Date   Anxiety    Chronic headaches    Clavicular fracture    History of chickenpox    Mild intermittent asthma 06/07/2018   Shingles    25 years old.   Urticaria 05/18/2020    Past Surgical History:  Procedure Laterality Date   COLONOSCOPY     CYSTECTOMY  01/2019   scrotum area    ESOPHAGEAL MANOMETRY N/A 06/09/2020   Procedure: ESOPHAGEAL MANOMETRY (EM);  Surgeon: Lavena Bullion, DO;  Location: [facility];  Service: Gastroenterology;  Laterality: N/A;   ESOPHAGOGASTRODUODENOSCOPY     shoulder surgery Right 08/26/2019   WISDOM TOOTH EXTRACTION      Family History  Problem Relation Age of Onset   Hypertension Mother    Colon polyps Mother        might be mistakrn   Diabetes  Father        type II   Migraines Sister    Colon cancer Neg Hx    Esophageal cancer Neg Hx    Rectal cancer Neg Hx    Stomach cancer Neg Hx     Social History   Socioeconomic History   Marital status: Single    Spouse name: Not on file   Number of children: 0   Years of education: College   Highest education level: Not on file  Occupational History   Occupation: Sales executive  Tobacco Use   Smoking status: Never   Smokeless tobacco: Never  Vaping Use   Vaping Use: Never used  Substance and Sexual Activity   Alcohol use: Yes    Alcohol/week: 3.0 standard drinks    Types: 3 Standard drinks or equivalent per week    Comment: weekly   Drug use: Yes    Types: Marijuana    Comment: rare use   Sexual activity: Not on file  Other Topics Concern   Not on file  Social History Narrative   Lives alone   Caffeine use: 1 cup coffee per day   Right handed    Social Determinants of Health   Financial Resource Strain: Not on file  Food Insecurity: Not on file  Transportation Needs: Not on file  Physical Activity: Not on file  Stress: Not on file  Social Connections: Not on file  Intimate Partner Violence: Not on file    Outpatient Medications Prior to Visit  Medication Sig Dispense Refill   albuterol (VENTOLIN HFA) 108 (90 Base) MCG/ACT inhaler Inhale 2 puffs into the lungs every 6 (six) hours as needed for wheezing or shortness of breath. 18 g 0   citalopram (CELEXA) 20 MG tablet Take 1 tablet (20 mg total) by mouth daily. 90 tablet 1   clonazePAM (KLONOPIN) 0.5 MG tablet Take 1 tablet (0.5 mg total) by mouth 2 (two) times daily as needed for anxiety. 30 tablet 0   EPINEPHrine (EPIPEN IJ) Inject 1 Dose as directed as needed.     PROAIR HFA 108 (90 Base) MCG/ACT inhaler Inhale 2 puffs into the lungs every 4 (four) hours as needed for wheezing or shortness of breath. 18 g 0   sildenafil (VIAGRA) 100 MG tablet Take 0.5-1 tablets (50-100 mg total) by mouth daily as needed for  erectile dysfunction. 10 tablet 11   No facility-administered medications prior to visit.    Allergies  Allergen Reactions   Shellfish Allergy Anaphylaxis   Hydrocodone Itching    Pt states he feels itchy after taking, but it doesn't preclude him taking hydrocodone if needed About 2-3 years ago.  It was the first time he took hydrocodone.   Other Diarrhea    Slightly allergic to red meat since tick bite.Marland KitchenMarland KitchenNot anaphylaxis, but GI sensitivity.    ROS Review of Systems  Constitutional:  Negative for appetite change, chills, fever and unexpected weight change.  HENT:  Negative for trouble swallowing.   Respiratory:  Negative for shortness of breath.   Cardiovascular:  Negative for chest pain.  Gastrointestinal:  Positive for abdominal pain. Negative for abdominal distention, blood in stool, constipation, diarrhea, nausea and vomiting.     Objective:    Physical Exam Vitals reviewed.  Constitutional:      General: He is not in acute distress.    Appearance: Normal appearance.  Cardiovascular:     Rate and Rhythm: Normal rate and regular rhythm.  Pulmonary:     Effort: Pulmonary effort is normal.     Breath sounds: Normal breath sounds.  Abdominal:     General: Bowel sounds are normal. There is no distension.     Palpations: Abdomen is soft. There is no mass.     Tenderness: There is no abdominal tenderness. There is no guarding or rebound.     Hernia: No hernia is present.  Neurological:     Mental Status: He is alert.    BP 128/80   Pulse 84   Temp 98.3 F (36.8 C) (Oral)   Ht '6\' 6"'$  (1.981 m)   Wt (!) 357 lb 7 oz (162.1 kg)   SpO2 97%   BMI 41.31 kg/m  Wt Readings from Last 3 Encounters:  05/27/21 (!) 357 lb 7 oz (162.1 kg)  03/22/21 (!) 349 lb (158.3 kg)  02/09/21 (!) 335 lb (152 kg)     Health Maintenance Due  Topic Date Due   Hepatitis C Screening  Never done   HPV VACCINES (2 - Male 3-dose series) 07/12/2018   COVID-19 Vaccine (3 - Booster for Pfizer  series) 05/30/2020   INFLUENZA VACCINE  04/25/2021       Topic Date Due   HPV VACCINES (2 - Male 3-dose series) 07/12/2018    Lab Results  Component Value Date   TSH 1.120  05/20/2020   Lab Results  Component Value Date   WBC 8.2 12/21/2020   HGB 15.8 12/21/2020   HCT 47.3 12/21/2020   MCV 90.6 12/21/2020   PLT 194.0 12/21/2020   Lab Results  Component Value Date   NA 137 06/30/2020   K 3.9 06/30/2020   CO2 27 06/30/2020   GLUCOSE 88 06/30/2020   BUN 13 06/30/2020   CREATININE 0.75 06/30/2020   BILITOT 0.6 06/30/2020   ALKPHOS 62 06/30/2020   AST 25 06/30/2020   ALT 36 06/30/2020   PROT 6.6 06/30/2020   ALBUMIN 4.3 06/30/2020   CALCIUM 8.9 06/30/2020   ANIONGAP 11 10/16/2019   GFR 127.49 06/30/2020   Lab Results  Component Value Date   CHOL 132 01/21/2020   Lab Results  Component Value Date   HDL 34.00 (L) 01/21/2020   Lab Results  Component Value Date   LDLCALC 82 01/21/2020   Lab Results  Component Value Date   TRIG 79.0 01/21/2020   Lab Results  Component Value Date   CHOLHDL 4 01/21/2020   Lab Results  Component Value Date   HGBA1C 5.0 01/21/2020      Assessment & Plan:   Increased symptoms of burping with some mild intermittent migratory abdominal pains.  Suspect gaseous.  Nonfocal exam.  -Discussed low FODMAP diet -Avoid sodas and other carbonated beverages -Avoid regular chewing of gum -Reviewed signs and symptoms of more worrisome abdominal pain and follow-up immediately for any appetite change, fever, recurrent vomiting, stool changes, localizing abdominal pain, etc.  No orders of the defined types were placed in this encounter.   Follow-up: No follow-ups on file.    Carolann Littler, MD

## 2021-06-01 ENCOUNTER — Other Ambulatory Visit: Payer: Self-pay | Admitting: Family Medicine

## 2021-06-03 ENCOUNTER — Ambulatory Visit (HOSPITAL_BASED_OUTPATIENT_CLINIC_OR_DEPARTMENT_OTHER)
Admission: RE | Admit: 2021-06-03 | Discharge: 2021-06-03 | Disposition: A | Discharge status: Home / Self Care | Payer: 59 | Source: Ambulatory Visit | Attending: Family Medicine | Admitting: Family Medicine

## 2021-06-03 ENCOUNTER — Ambulatory Visit: Payer: 59 | Admitting: Family Medicine

## 2021-06-03 ENCOUNTER — Other Ambulatory Visit: Payer: Self-pay

## 2021-06-03 VITALS — BP 130/70 | HR 83 | Temp 98.2°F | Wt 359.9 lb

## 2021-06-03 DIAGNOSIS — R1012 Left upper quadrant pain: Secondary | ICD-10-CM | POA: Diagnosis present

## 2021-06-03 LAB — CBC WITH DIFFERENTIAL/PLATELET
Basophils Absolute: 0.1 10*3/uL (ref 0.0–0.1)
Basophils Relative: 1.2 % (ref 0.0–3.0)
Eosinophils Absolute: 0.4 10*3/uL (ref 0.0–0.7)
Eosinophils Relative: 7.4 % — ABNORMAL HIGH (ref 0.0–5.0)
HCT: 45 % (ref 39.0–52.0)
Hemoglobin: 15 g/dL (ref 13.0–17.0)
Lymphocytes Relative: 23.8 % (ref 12.0–46.0)
Lymphs Abs: 1.4 10*3/uL (ref 0.7–4.0)
MCHC: 33.3 g/dL (ref 30.0–36.0)
MCV: 89.5 fl (ref 78.0–100.0)
Monocytes Absolute: 0.3 10*3/uL (ref 0.1–1.0)
Monocytes Relative: 5.1 % (ref 3.0–12.0)
Neutro Abs: 3.8 10*3/uL (ref 1.4–7.7)
Neutrophils Relative %: 62.5 % (ref 43.0–77.0)
Platelets: 204 10*3/uL (ref 150.0–400.0)
RBC: 5.03 Mil/uL (ref 4.22–5.81)
RDW: 12.6 % (ref 11.5–15.5)
WBC: 6 10*3/uL (ref 4.0–10.5)

## 2021-06-03 NOTE — Progress Notes (Signed)
Established Patient Office Visit  Subjective:  Patient ID: Bradley [name], male    DOB: [birth year]  Age: 25 y.o. MRN: [id number]  CC:  Chief Complaint  Patient presents with   Follow-up    Stomach pain, Left upper quadrant, no improvement. Pain comes and goes    HPI Bradley [name] presents for intermittent abdominal pain left upper quadrant.  Refer to last note for details.  He had some gas bloat type symptoms with some increased burping.  We discussed low FODMAP type diet.  He now relates somewhat different pain.  Left upper quadrant and intermittent.  Burning type quality sometimes lasts about 30 minutes.  Possibly worse after eating.  He states this is not typical of reflux symptoms.  He has some soreness persistently left upper quadrant over the spleen region.  Denies any recent injury.  No bruising.  No nausea or vomiting.  No fever.  No dizziness.  No skin rashes.  No recent sore throat symptoms and no recent adenopathy.  He had EGD 6/20 which was unremarkable.  Denies any recent dysphagia.  No stool changes.  Past Medical History:  Diagnosis Date   Anxiety    Chronic headaches    Clavicular fracture    History of chickenpox    Mild intermittent asthma 06/07/2018   Shingles    25 years old.   Urticaria 05/18/2020    Past Surgical History:  Procedure Laterality Date   COLONOSCOPY     CYSTECTOMY  01/2019   scrotum area    ESOPHAGEAL MANOMETRY N/A 06/09/2020   Procedure: ESOPHAGEAL MANOMETRY (EM);  Surgeon: Lavena Bullion, DO;  Location: [facility];  Service: Gastroenterology;  Laterality: N/A;   ESOPHAGOGASTRODUODENOSCOPY     shoulder surgery Right 08/26/2019   WISDOM TOOTH EXTRACTION      Family History  Problem Relation Age of Onset   Hypertension Mother    Colon polyps Mother        might be mistakrn   Diabetes Father        type II   Migraines Sister    Colon cancer Neg Hx    Esophageal cancer Neg Hx    Rectal cancer Neg Hx    Stomach cancer Neg Hx      Social History   Socioeconomic History   Marital status: Single    Spouse name: Not on file   Number of children: 0   Years of education: College   Highest education level: Not on file  Occupational History   Occupation: Sales executive  Tobacco Use   Smoking status: Never   Smokeless tobacco: Never  Vaping Use   Vaping Use: Never used  Substance and Sexual Activity   Alcohol use: Yes    Alcohol/week: 3.0 standard drinks    Types: 3 Standard drinks or equivalent per week    Comment: weekly   Drug use: Yes    Types: Marijuana    Comment: rare use   Sexual activity: Not on file  Other Topics Concern   Not on file  Social History Narrative   Lives alone   Caffeine use: 1 cup coffee per day   Right handed    Social Determinants of Health   Financial Resource Strain: Not on file  Food Insecurity: Not on file  Transportation Needs: Not on file  Physical Activity: Not on file  Stress: Not on file  Social Connections: Not on file  Intimate Partner Violence: Not on file    Outpatient  Medications Prior to Visit  Medication Sig Dispense Refill   albuterol (VENTOLIN HFA) 108 (90 Base) MCG/ACT inhaler Inhale 2 puffs into the lungs every 6 (six) hours as needed for wheezing or shortness of breath. 18 g 0   citalopram (CELEXA) 20 MG tablet Take 1 tablet (20 mg total) by mouth daily. 90 tablet 1   clonazePAM (KLONOPIN) 0.5 MG tablet Take 1 tablet (0.5 mg total) by mouth 2 (two) times daily as needed for anxiety. 30 tablet 0   EPINEPHrine (EPIPEN IJ) Inject 1 Dose as directed as needed.     PROAIR HFA 108 (90 Base) MCG/ACT inhaler Inhale 2 puffs into the lungs every 4 (four) hours as needed for wheezing or shortness of breath. 18 g 0   sildenafil (VIAGRA) 100 MG tablet Take 0.5-1 tablets (50-100 mg total) by mouth daily as needed for erectile dysfunction. 10 tablet 11   No facility-administered medications prior to visit.    Allergies  Allergen Reactions   Shellfish Allergy  Anaphylaxis   Hydrocodone Itching    Pt states he feels itchy after taking, but it doesn't preclude him taking hydrocodone if needed About 2-3 years ago.  It was the first time he took hydrocodone.   Other Diarrhea    Slightly allergic to red meat since tick bite.Marland KitchenMarland KitchenNot anaphylaxis, but GI sensitivity.    ROS Review of Systems  Constitutional:  Negative for appetite change, chills, fever and unexpected weight change.  HENT:  Negative for sore throat.   Respiratory:  Negative for cough and shortness of breath.   Cardiovascular:  Negative for chest pain.  Hematological:  Negative for adenopathy.     Objective:    Physical Exam Vitals reviewed.  Cardiovascular:     Rate and Rhythm: Normal rate and regular rhythm.  Pulmonary:     Effort: Pulmonary effort is normal.     Breath sounds: Normal breath sounds.  Abdominal:     Comments: Normal bowel sounds.  No abdominal distention.  Does have some tenderness to palpation left upper quadrant.  Abdominal exam is challenging because of his obesity.  No definite masses.  No guarding or rebound tenderness.  No right upper quadrant tenderness  Musculoskeletal:     Cervical back: Neck supple.  Lymphadenopathy:     Cervical: No cervical adenopathy.  Neurological:     Mental Status: He is alert.    BP 130/70 (BP Location: Left Arm, Patient Position: Sitting, Cuff Size: Normal)   Pulse 83   Temp 98.2 F (36.8 C) (Oral)   Wt (!) 359 lb 14.4 oz (163.2 kg)   SpO2 97%   BMI 41.59 kg/m  Wt Readings from Last 3 Encounters:  06/03/21 (!) 359 lb 14.4 oz (163.2 kg)  05/27/21 (!) 357 lb 7 oz (162.1 kg)  03/22/21 (!) 349 lb (158.3 kg)     Health Maintenance Due  Topic Date Due   Hepatitis C Screening  Never done   HPV VACCINES (2 - Male 3-dose series) 07/12/2018   COVID-19 Vaccine (3 - Booster for Pfizer series) 05/30/2020   INFLUENZA VACCINE  04/25/2021       Topic Date Due   HPV VACCINES (2 - Male 3-dose series) 07/12/2018    Lab  Results  Component Value Date   TSH 1.120 05/20/2020   Lab Results  Component Value Date   WBC 8.2 12/21/2020   HGB 15.8 12/21/2020   HCT 47.3 12/21/2020   MCV 90.6 12/21/2020   PLT 194.0 12/21/2020  Lab Results  Component Value Date   NA 137 06/30/2020   K 3.9 06/30/2020   CO2 27 06/30/2020   GLUCOSE 88 06/30/2020   BUN 13 06/30/2020   CREATININE 0.75 06/30/2020   BILITOT 0.6 06/30/2020   ALKPHOS 62 06/30/2020   AST 25 06/30/2020   ALT 36 06/30/2020   PROT 6.6 06/30/2020   ALBUMIN 4.3 06/30/2020   CALCIUM 8.9 06/30/2020   ANIONGAP 11 10/16/2019   GFR 127.49 06/30/2020   Lab Results  Component Value Date   CHOL 132 01/21/2020   Lab Results  Component Value Date   HDL 34.00 (L) 01/21/2020   Lab Results  Component Value Date   LDLCALC 82 01/21/2020   Lab Results  Component Value Date   TRIG 79.0 01/21/2020   Lab Results  Component Value Date   CHOLHDL 4 01/21/2020   Lab Results  Component Value Date   HGBA1C 5.0 01/21/2020      Assessment & Plan:   Problem List Items Addressed This Visit   None Visit Diagnoses     Abdominal pain, LUQ    -  Primary   Relevant Orders   CBC with Differential/Platelet   US Abdomen Complete     Patient has some intermittent left upper quadrant pain of unclear origin.  Somewhat progressive .  Tenderness over the region of the spleen.  No recent fever or other infectious symptoms.  Denies any fever, dizziness or other concerns.  -CBC -consider Ultrasound abdomen to further assess.   No orders of the defined types were placed in this encounter.   Follow-up: No follow-ups on file.    Carolann Littler, MD

## 2021-06-04 ENCOUNTER — Encounter: Payer: Self-pay | Admitting: Family Medicine

## 2021-06-08 ENCOUNTER — Encounter: Payer: Self-pay | Admitting: Family Medicine

## 2021-06-10 ENCOUNTER — Other Ambulatory Visit: Payer: Self-pay

## 2021-06-16 ENCOUNTER — Encounter: Payer: Self-pay | Admitting: Family Medicine

## 2021-06-16 ENCOUNTER — Other Ambulatory Visit: Payer: Self-pay

## 2021-06-16 ENCOUNTER — Ambulatory Visit: Payer: 59 | Admitting: Family Medicine

## 2021-06-16 VITALS — BP 126/86 | HR 86 | Temp 99.0°F | Resp 18 | Ht 66.0 in | Wt 357.0 lb

## 2021-06-16 DIAGNOSIS — T7800XD Anaphylactic reaction due to unspecified food, subsequent encounter: Secondary | ICD-10-CM

## 2021-06-16 DIAGNOSIS — L509 Urticaria, unspecified: Secondary | ICD-10-CM

## 2021-06-16 DIAGNOSIS — J302 Other seasonal allergic rhinitis: Secondary | ICD-10-CM

## 2021-06-16 DIAGNOSIS — H1010 Acute atopic conjunctivitis, unspecified eye: Secondary | ICD-10-CM

## 2021-06-16 DIAGNOSIS — J3089 Other allergic rhinitis: Secondary | ICD-10-CM

## 2021-06-16 DIAGNOSIS — K219 Gastro-esophageal reflux disease without esophagitis: Secondary | ICD-10-CM | POA: Diagnosis not present

## 2021-06-16 DIAGNOSIS — J4530 Mild persistent asthma, uncomplicated: Secondary | ICD-10-CM

## 2021-06-16 DIAGNOSIS — Z91018 Allergy to other foods: Secondary | ICD-10-CM

## 2021-06-16 DIAGNOSIS — H1013 Acute atopic conjunctivitis, bilateral: Secondary | ICD-10-CM

## 2021-06-16 MED ORDER — PROAIR HFA 108 (90 BASE) MCG/ACT IN AERS: 2.0000 | INHALATION_SPRAY | RESPIRATORY_TRACT | 0 refills | Status: DC | PRN

## 2021-06-16 MED ORDER — OMEPRAZOLE 40 MG PO CPDR: 40.0000 mg | DELAYED_RELEASE_CAPSULE | Freq: Every day | ORAL | 5 refills | Status: DC

## 2021-06-16 MED ORDER — MONTELUKAST SODIUM 10 MG PO TABS: 10.0000 mg | ORAL_TABLET | Freq: Every day | ORAL | 3 refills | Status: DC

## 2021-06-16 NOTE — Patient Instructions (Addendum)
Shortness of breath Restart montelukast 10 mg once a day to prevent cough or wheeze Continue albuterol 2 puffs every 4 hours as needed for cough, wheeze, tightness in chest, or shortness of breath  Reflux Continue dietary and lifestyle modifications Begin omeprazole 40 mg once a day to prevent cough or wheeze  Allergic rhinitis Continue avoidance measures directed toward mold, cockroach, pollens, dog, and cat as listed below Begin Flonase 2 sprays in each nostril once a day as needed for a stuffy nose.  In the right nostril, point the applicator out toward the right ear. In the left nostril, point the applicator out toward the left ear Continue levocetirizine 5 mg once a day as needed for a runny nose or itch. Remember to rotate to a different antihistamine about every 3 months. Some examples of over the counter antihistamines include Zyrtec (cetirizine), Xyzal (levocetirizine), Allegra (fexofenadine), and Claritin (loratidine).  Continue Azelastine 2 sprays in each nostril twice a day as needed for a runny nose Consider saline nasal rinses as needed for nasal symptoms. 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 In case of an allergic reaction, take Benadryl 50 mg every 4 hours, and if life-threatening symptoms occur, inject with EpiPen 0.3 mg. We will place an order to recheck your alpha gal levels.  We will call you when the results become available  Food allergy Continue to avoid shellfish. In case of an allergic reaction, take Benadryl 50 mg every 4 hours, and if life-threatening symptoms occur, inject with AuviQ 0.3 mg. Return to the clinic if you are interested in reevaluating your food allergy to shellfish.  Remember to stop antihistamines for 3 days before your testing appointment.  Hives Take the least amount of medications while remaining hive free Levocetirizine (Xyzal) 5 mg twice a day and  famotidine (Pepcid) 20 mg twice a day. If no symptoms for 7-14 days then decrease to. Levocetirizine (Xyzal) 5 mg twice a day and famotidine (Pepcid) 20 mg once a day.  If no symptoms for 7-14 days then decrease to. Levocetirizine (Xyzal) 5 mg twice a day.  If no symptoms for 7-14 days then decrease to. Levocetirizine (Xyzal) 5 mg once a day.   May use Benadryl (diphenhydramine) as needed for breakthrough hives       If symptoms return, then step up dosage Keep a detailed symptom journal including foods eaten, contact with allergens, medications taken, weather changes.  Call the clinic if this treatment plan is not working well for you  Follow up in 3 months or sooner if needed.  Reducing Pollen Exposure The American Academy of All[employer], Asthma and Immun[employer] suggests the following steps to reduce your exposure to pollen during allergy seasons. Do not hang sheets or clothing out to dry; pollen may collect on these items. Do not mow lawns or spend time around freshly cut grass; mowing stirs up pollen. Keep windows closed at night.  Keep car windows closed while driving. Minimize morning activities outdoors, a time when pollen counts are usually at their highest. Stay indoors as much as possible when pollen counts or humidity is high and on windy days when pollen tends to remain in the air longer. Use air conditioning when possible.  Many air conditioners have filters that trap the pollen spores. Use a HEPA room air filter to remove pollen form the indoor air you breathe.  Control of Mold Allergen Mold and fungi can grow  on a variety of surfaces provided certain temperature and moisture conditions exist.  Outdoor molds grow on plants, decaying vegetation and soil.  The major outdoor mold, Alternaria and Cladosporium, are found in very high numbers during hot and dry conditions.  Generally, a late Summer - Fall peak is seen for common outdoor fungal spores.  Rain will temporarily lower outdoor  mold spore count, but counts rise rapidly when the rainy period ends.  The most important indoor molds are Aspergillus and Penicillium.  Dark, humid and poorly ventilated basements are ideal sites for mold growth.  The next most common sites of mold growth are the bathroom and the kitchen.  Outdoor [employer] Use air conditioning and keep windows closed Avoid exposure to decaying vegetation. Avoid leaf raking. Avoid grain handling. Consider wearing a face mask if working in moldy areas.  Indoor Mold Control Maintain humidity below 50%. Clean washable surfaces with 5% bleach solution. Remove sources e.g. Contaminated carpets.  Control of Dog or Cat Allergen Avoidance is the best way to manage a dog or cat allergy. If you have a dog or cat and are allergic to dog or cats, consider removing the dog or cat from the home. If you have a dog or cat but don't want to find it a new home, or if your family wants a pet even though someone in the household is allergic, here are some strategies that may help keep symptoms at bay:  Keep the pet out of your bedroom and restrict it to only a few rooms. Be advised that keeping the dog or cat in only one room will not limit the allergens to that room. Don't pet, hug or kiss the dog or cat; if you do, wash your hands with soap and water. High-efficiency particulate air (HEPA) cleaners run continuously in a bedroom or living room can reduce allergen levels over time. Regular use of a high-efficiency vacuum cleaner or a central vacuum can reduce allergen levels. Giving your dog or cat a bath at least once a week can reduce airborne allergen.

## 2021-06-16 NOTE — Progress Notes (Addendum)
[address] [place] [address] Dept: [phone]  FOLLOW UP NOTE  Patient ID: Bradley [name], male    DOB: 27-Mar-1996  Age: 25 y.o. MRN: [id number] Date of Office Visit: 06/16/2021  Assessment  Chief Complaint: Follow-up (Pt is doing well,) and Asthma  HPI Bradley [name] is a 11 year ol/d male who presents to the clinic for a follow up visit. He was last seen in this clinic on 05/18/2020 by Dr. Maudie Mercury for evaluation of allergic rhinitis, chronic urticaria, alpha gal allergy, reflux, food allergy to shellfish, and shortness of breath. At today's visit, he reports his asthma has been moderately well controlled with symptoms including occasional shortness of breath with vigorous activity or exposure to smoke, wheeze especially occurring in the morning, and occasional cough which is producing phlegm.  He stopped taking montelukast several months ago as he thought this was not effective.  He continues albuterol about 2 times a week with relief of symptoms.  Allergic rhinitis is reported as moderately well controlled with symptoms including clear rhinorrhea and nasal congestion occurring mostly in the morning as well as postnasal drainage with frequent throat clearing.  He continues Xyzal 5 mg once a day as needed and is not currently using a nasal saline rinse or medicated nasal sprays.  Reflux is reported as moderately well controlled with heartburn occurring about 2 days a week for which she takes Tums with relief of symptoms.  He does report that he infrequently takes pantoprazole 40 mg once a day.  He continues to avoid shellfish with the exception of crab, lobster, and oysters with no accidental ingestion or Epipen use since his last visit to this clinic. His last food allergy blood work was on 02/07/2019. Hives are reported as well controlled with no breakouts since his last visit to this clinic. He continues to avoid most mammalian products with the exception of occasional bacon, milk, and cheese  without any adverse reaction. He denies any recent tick bites. His last alpha gal panel was on 12/24/2020 and was slightly elevated. His current medications are listed in the chart.   Drug Allergies:  Allergies  Allergen Reactions   Shellfish Allergy Anaphylaxis   Hydrocodone Itching    Pt states he feels itchy after taking, but it doesn't preclude him taking hydrocodone if needed About 2-3 years ago.  It was the first time he took hydrocodone.   Other Diarrhea    Slightly allergic to red meat since tick bite.Marland KitchenMarland KitchenNot anaphylaxis, but GI sensitivity.    Physical Exam: BP 126/86   Pulse 86   Temp 99 F (37.2 C) (Temporal)   Resp 18   Ht 5\' 6"  (1.676 m)   Wt (!) 357 lb (161.9 kg)   SpO2 99%   BMI 57.62 kg/m    Physical Exam Vitals reviewed.  Constitutional:      Appearance: Normal appearance.  HENT:     Head: Normocephalic and atraumatic.     Right Ear: Tympanic membrane normal.     Left Ear: Tympanic membrane normal.     Nose:     Comments: Bilateral nares slightly erythematous with clear nasal drainage noted. Pharynx slightly erythematous without exudate or cobblestoning. Ears normal. Eyes normal. Eyes:     Conjunctiva/sclera: Conjunctivae normal.  Cardiovascular:     Rate and Rhythm: Normal rate and regular rhythm.     Heart sounds: Normal heart sounds. No murmur heard. Pulmonary:     Effort: Pulmonary effort is normal.  Comments: Slight bilateral scattered expiratory wheeze.  Wheeze cleared post bronchodilator therapy. Musculoskeletal:        General: Normal range of motion.     Cervical back: Normal range of motion and neck supple.  Skin:    General: Skin is warm and dry.  Neurological:     Mental Status: He is alert and oriented to person, place, and time.  Psychiatric:        Mood and Affect: Mood normal.        Behavior: Behavior normal.        Thought Content: Thought content normal.        Judgment: Judgment normal.    Diagnostics: FVC 6.33, FEV1 4.03.   Predicted FVC 7.17, predicted FEV1 5.85.  Spirometry indicates normal ventilatory function. Post bronchodilator therapy FVC 6.53, FEV1 5.22. Post bronchodilator spirometry indicates no significant improvement.  Assessment and Plan: 1. Mild persistent asthma without complication   2. Seasonal and perennial allergic rhinoconjunctivitis   3. Urticaria   4. Gastroesophageal reflux disease, unspecified whether esophagitis present   5. Allergy to alpha-gal   6. Anaphylactic shock due to food, subsequent encounter     Meds ordered this encounter  Medications   PROAIR HFA 108 (90 Base) MCG/ACT inhaler    Sig: Inhale 2 puffs into the lungs every 4 (four) hours as needed for wheezing or shortness of breath.    Dispense:  18 g    Refill:  0    This is a courtesy refill. Patient needs an OV for further refills.   montelukast (SINGULAIR) 10 MG tablet    Sig: Take 1 tablet (10 mg total) by mouth at bedtime.    Dispense:  30 tablet    Refill:  3   omeprazole (PRILOSEC) 40 MG capsule    Sig: Take 1 capsule (40 mg total) by mouth daily.    Dispense:  30 capsule    Refill:  5     Patient Instructions  Shortness of breath Restart montelukast 10 mg once a day to prevent cough or wheeze Continue albuterol 2 puffs every 4 hours as needed for cough, wheeze, tightness in chest, or shortness of breath  Reflux Continue dietary and lifestyle modifications Begin omeprazole 40 mg once a day to prevent reflux. This may help reduce your cough and wheeze  Allergic rhinitis Continue avoidance measures directed toward mold, cockroach, pollens, dog, and cat as listed below Begin Flonase 2 sprays in each nostril once a day as needed for a stuffy nose.  In the right nostril, point the applicator out toward the right ear. In the left nostril, point the applicator out toward the left ear Continue levocetirizine 5 mg once a day as needed for a runny nose or itch. Remember to rotate to a different antihistamine  about every 3 months. Some examples of over the counter antihistamines include Zyrtec (cetirizine), Xyzal (levocetirizine), Allegra (fexofenadine), and Claritin (loratidine).  Continue Azelastine 2 sprays in each nostril twice a day as needed for a runny nose Consider saline nasal rinses as needed for nasal symptoms. 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 In case of an allergic reaction, take Benadryl 50 mg every 4 hours, and if life-threatening symptoms occur, inject with EpiPen 0.3 mg. We will place an order to recheck your alpha gal levels.  We will call you when the results become available  Food allergy Continue to avoid shellfish.  In case of an allergic reaction, take Benadryl 50 mg every 4 hours, and if life-threatening symptoms occur, inject with AuviQ 0.3 mg. Return to the clinic if you are interested in reevaluating your food allergy to shellfish.  Remember to stop antihistamines for 3 days before your testing appointment.  Hives Take the least amount of medications while remaining hive free Levocetirizine (Xyzal) 5 mg twice a day and famotidine (Pepcid) 20 mg twice a day. If no symptoms for 7-14 days then decrease to. Levocetirizine (Xyzal) 5 mg twice a day and famotidine (Pepcid) 20 mg once a day.  If no symptoms for 7-14 days then decrease to. Levocetirizine (Xyzal) 5 mg twice a day.  If no symptoms for 7-14 days then decrease to. Levocetirizine (Xyzal) 5 mg once a day.   May use Benadryl (diphenhydramine) as needed for breakthrough hives       If symptoms return, then step up dosage Keep a detailed symptom journal including foods eaten, contact with allergens, medications taken, weather changes.  Call the clinic if this treatment plan is not working well for you  Follow up in 3 months or sooner if needed.   Return in about 3 months (around 09/15/2021), or if symptoms worsen or fail  to improve.    Thank you for the opportunity to care for this patient.  Please do not hesitate to contact me with questions.  Gareth Morgan, FNP [facility]

## 2021-06-16 NOTE — Progress Notes (Deleted)
   [address] [place]  [address] Dept: [phone]  FOLLOW UP NOTE  Patient ID: Bradley [name], male    DOB: June 20, 1996  Age: 25 y.o. MRN: [id number] Date of Office Visit: 06/16/2021  Assessment  Chief Complaint: No chief complaint on file.  HPI Bradley [name] is a 25 year old male who presents to the clinic for a follow up visit. He was last seen in this clinic on 05/18/2020 by Dr. Maudie Mercury for evaluation of shortness of breath, allergic rhinitis, urticaria, reflux, and food allergy to shellfish. His last environmental allergy testing was 2019 blood work was positive to dust mites, cat, dog, grass pollen, and tree pollen, and mildly positive to ragweed and cockroach.  His last food allergy testing was on 02/07/2019 and was positive to shellfish by blood work.    Drug Allergies:  Allergies  Allergen Reactions   Shellfish Allergy Anaphylaxis   Hydrocodone Itching    Pt states he feels itchy after taking, but it doesn't preclude him taking hydrocodone if needed About 2-3 years ago.  It was the first time he took hydrocodone.   Other Diarrhea    Slightly allergic to red meat since tick bite.Marland KitchenMarland KitchenNot anaphylaxis, but GI sensitivity.    Physical Exam: There were no vitals taken for this visit.   Physical Exam  Diagnostics:    Assessment and Plan: No diagnosis found.  No orders of the defined types were placed in this encounter.   There are no Patient Instructions on file for this visit.  No follow-ups on file.    Thank you for the opportunity to care for this patient.  Please do not hesitate to contact me with questions.  Gareth Morgan, FNP [facility]

## 2021-07-06 ENCOUNTER — Telehealth (INDEPENDENT_AMBULATORY_CARE_PROVIDER_SITE_OTHER): Payer: 59 | Admitting: Family Medicine

## 2021-07-06 DIAGNOSIS — R197 Diarrhea, unspecified: Secondary | ICD-10-CM

## 2021-07-06 NOTE — Progress Notes (Signed)
Patient ID: Bradley [name], male   DOB: 1995-11-18, 25 y.o.   MRN: [id number]  This visit type was conducted due to national recommendations for restrictions regarding the COVID-19 pandemic in an effort to limit this patient's exposure and mitigate transmission in our community.   Virtual Visit via Video Note  I connected with Bradley [name] on 07/06/21 at  5:00 PM EDT by a video enabled telemedicine application and verified that I am speaking with the correct person using two identifiers.  Location patient: home Location provider:work or home office Persons participating in the virtual visit: patient, provider  I discussed the limitations of evaluation and management by telemedicine and the availability of in person appointments. The patient expressed understanding and agreed to proceed.   HPI: Bradley [name] has longstanding history of GI complaints.  He does have history of reported IBS.  He has seen GI in the past.  He relates 2-week history of frequent loose stools.  This is bordering on diarrhea.  No bloody stools.  Usually having 3-4 stools per day.  No recent antibiotic use.  No recent travels.  He does have a new dog and states his dog was diagnosed with hookworms recently.  Bradley [name] denies any nausea or vomiting.  No abdominal pain.  He was concerned about duration of diarrhea symptoms.  No history of celiac disease.  No history of lactose intolerance.  Appetite stable.  He had colonoscopy back in June 2020 which showed benign polyp and otherwise normal.   ROS: See pertinent positives and negatives per HPI.  Past Medical History:  Diagnosis Date   Anxiety    Chronic headaches    Clavicular fracture    History of chickenpox    Mild intermittent asthma 06/07/2018   Shingles    25 years old.   Urticaria 05/18/2020    Past Surgical History:  Procedure Laterality Date   COLONOSCOPY     CYSTECTOMY  01/2019   scrotum area    ESOPHAGEAL MANOMETRY N/A 06/09/2020   Procedure: ESOPHAGEAL MANOMETRY  (EM);  Surgeon: Lavena Bullion, DO;  Location: [facility];  Service: Gastroenterology;  Laterality: N/A;   ESOPHAGOGASTRODUODENOSCOPY     shoulder surgery Right 08/26/2019   WISDOM TOOTH EXTRACTION      Family History  Problem Relation Age of Onset   Hypertension Mother    Colon polyps Mother        might be mistakrn   Diabetes Father        type II   Migraines Sister    Colon cancer Neg Hx    Esophageal cancer Neg Hx    Rectal cancer Neg Hx    Stomach cancer Neg Hx     SOCIAL HX: Non-smoker   Current Outpatient Medications:    albuterol (VENTOLIN HFA) 108 (90 Base) MCG/ACT inhaler, Inhale 2 puffs into the lungs every 6 (six) hours as needed for wheezing or shortness of breath., Disp: 18 g, Rfl: 0   citalopram (CELEXA) 20 MG tablet, Take 1 tablet (20 mg total) by mouth daily., Disp: 90 tablet, Rfl: 1   clonazePAM (KLONOPIN) 0.5 MG tablet, Take 1 tablet (0.5 mg total) by mouth 2 (two) times daily as needed for anxiety., Disp: 30 tablet, Rfl: 0   EPINEPHrine (EPIPEN IJ), Inject 1 Dose as directed as needed., Disp: , Rfl:    montelukast (SINGULAIR) 10 MG tablet, Take 1 tablet (10 mg total) by mouth at bedtime., Disp: 30 tablet, Rfl: 3   omeprazole (PRILOSEC) 40 MG capsule, Take  1 capsule (40 mg total) by mouth daily., Disp: 30 capsule, Rfl: 5   PROAIR HFA 108 (90 Base) MCG/ACT inhaler, Inhale 2 puffs into the lungs every 4 (four) hours as needed for wheezing or shortness of breath., Disp: 18 g, Rfl: 0   sildenafil (VIAGRA) 100 MG tablet, Take 0.5-1 tablets (50-100 mg total) by mouth daily as needed for erectile dysfunction., Disp: 10 tablet, Rfl: 11  EXAM:  VITALS per patient if applicable:  GENERAL: alert, oriented, appears well and in no acute distress  HEENT: atraumatic, conjunttiva clear, no obvious abnormalities on inspection of external nose and ears  NECK: normal movements of the head and neck  LUNGS: on inspection no signs of respiratory distress, breathing rate  appears normal, no obvious gross SOB, gasping or wheezing  CV: no obvious cyanosis  MS: moves all visible extremities without noticeable abnormality  PSYCH/NEURO: pleasant and cooperative, no obvious depression or anxiety, speech and thought processing grossly intact  ASSESSMENT AND PLAN:  Discussed the following assessment and plan:  Diarrhea, unspecified type - Plan: GI Profile, Stool, PCR -Patient relates about 13-day history of diarrhea and loose stools.  No bloody stools.  Does not any red flags such as recent antibiotics, travels, abdominal pain, bloody stools, fever, etc.  -He just started self treatment with probiotic yesterday. -We discussed possible GI profile of the stool given duration of symptoms -If above unrevealing and symptoms persist consider referral back to GI.     I discussed the assessment and treatment plan with the patient. The patient was provided an opportunity to ask questions and all were answered. The patient agreed with the plan and demonstrated an understanding of the instructions.   The patient was advised to call back or seek an in-person evaluation if the symptoms worsen or if the condition fails to improve as anticipated.     Carolann Littler, MD

## 2021-07-07 ENCOUNTER — Encounter: Payer: Self-pay | Admitting: Family Medicine

## 2021-07-07 ENCOUNTER — Ambulatory Visit: Payer: 59 | Admitting: Family Medicine

## 2021-07-07 ENCOUNTER — Other Ambulatory Visit: Payer: Self-pay

## 2021-07-07 VITALS — BP 126/70 | HR 80 | Temp 98.7°F | Wt 362.4 lb

## 2021-07-07 DIAGNOSIS — R6884 Jaw pain: Secondary | ICD-10-CM

## 2021-07-07 DIAGNOSIS — H66001 Acute suppurative otitis media without spontaneous rupture of ear drum, right ear: Secondary | ICD-10-CM | POA: Diagnosis not present

## 2021-07-07 MED ORDER — AMOXICILLIN-POT CLAVULANATE 500-125 MG PO TABS: 1.0000 | ORAL_TABLET | Freq: Two times a day (BID) | ORAL | 0 refills | Status: AC

## 2021-07-07 NOTE — Progress Notes (Addendum)
Subjective:    Patient ID: Bradley [name], male    DOB: 1996-01-04, 25 y.o.   MRN: [id number]  Chief Complaint  Patient presents with   Jaw Pain    Started today, rigth side, teeth pain, very painful to move the jaw    HPI Patient was seen today for acute concern.  Patient endorses 8/10 right sided jaw pain starting today while driving.  Patient notes pain when biting down.  Denies eating anything hard, grinding teeth at night, dental pain, facial edema.  Last dental visit was 3 months ago.  Patient does note history of seasonal allergies.  Had COVID-19 infection 3 wks ago.  Of note patient seen virtually yesterday 07/06/2021 for GI issues.  Concerns regarding possible infectious diarrhea.  Picking up container for stool studies today.  Pt's puppy is currently being treated for hookworms.  Past Medical History:  Diagnosis Date   Anxiety    Chronic headaches    Clavicular fracture    History of chickenpox    Mild intermittent asthma 06/07/2018   Shingles    25 years old.   Urticaria 05/18/2020    Allergies  Allergen Reactions   Shellfish Allergy Anaphylaxis   Hydrocodone Itching    Pt states he feels itchy after taking, but it doesn't preclude him taking hydrocodone if needed About 2-3 years ago.  It was the first time he took hydrocodone.   Other Diarrhea    Slightly allergic to red meat since tick bite.Marland KitchenMarland KitchenNot anaphylaxis, but GI sensitivity.    ROS General: Denies fever, chills, night sweats, changes in weight, changes in appetite HEENT: Denies headaches, ear pain, changes in vision, rhinorrhea, sore throat + right-sided jaw pain CV: Denies CP, palpitations, SOB, orthopnea Pulm: Denies SOB, cough, wheezing GI: Denies abdominal pain, nausea, vomiting, diarrhea, constipation GU: Denies dysuria, hematuria, frequency Msk: Denies muscle cramps, joint pains Neuro: Denies weakness, numbness, tingling Skin: Denies rashes, bruising Psych: Denies depression, anxiety, hallucinations     Objective:    Blood pressure 126/70, pulse 80, temperature 98.7 F (37.1 C), temperature source Oral, weight (!) 362 lb 6.4 oz (164.4 kg), SpO2 99 %.  Gen. Pleasant, well-nourished, in no distress, normal affect   HEENT: [place]/AT, face symmetric, conjunctiva clear, no scleral icterus, PERRLA, EOMI, nares patent without drainage, no facial edema, no dental caries noted.  Pharynx without erythema or exudate.  Left external ear and canal normal.  Left TM flat and dull.  Normal external right ear and canal.  Right TM full with mild erythema and suppurative fluid.  No jaw instability with opening and closing mouth.  No cervical, preauricular, or postauricular lymphadenopathy. Lungs: no accessory muscle use Cardiovascular: RRR, no peripheral edema Neuro:  A&Ox3, CN II-XII intact, normal gait Skin:  Warm, no lesions/ rash   Wt Readings from Last 3 Encounters:  07/07/21 (!) 362 lb 6.4 oz (164.4 kg)  06/16/21 (!) 357 lb (161.9 kg)  06/03/21 (!) 359 lb 14.4 oz (163.2 kg)    Lab Results  Component Value Date   WBC 6.0 06/03/2021   HGB 15.0 06/03/2021   HCT 45.0 06/03/2021   PLT 204.0 06/03/2021   GLUCOSE 88 06/30/2020   CHOL 132 01/21/2020   TRIG 79.0 01/21/2020   HDL 34.00 (L) 01/21/2020   LDLCALC 82 01/21/2020   ALT 36 06/30/2020   AST 25 06/30/2020   NA 137 06/30/2020   K 3.9 06/30/2020   CL 103 06/30/2020   CREATININE 0.75 06/30/2020   BUN 13 06/30/2020  CO2 27 06/30/2020   TSH 1.120 05/20/2020   HGBA1C 5.0 01/21/2020    Assessment/Plan:  Acute suppurative otitis media of right ear without spontaneous rupture of tympanic membrane, recurrence not specified  -Supportive care as needed including Tylenol or NSAIDs for any pain/discomfort -Start Augmentin after collection of stool sample if able -Given precautions - Plan: amoxicillin-clavulanate (AUGMENTIN) 500-125 MG tablet  Jaw pain -Discussed possible causes including AOM, TMJ -Reassured as no instability on  exam. -Symptoms likely 2/2 current AOM.  We will start Augmentin. -Supportive care including heat, Tylenol as needed for pain/discomfort  F/u as needed  Grier Mitts, MD

## 2021-07-08 NOTE — Telephone Encounter (Signed)
An alpha gal panel was ordered at his last visit. Can you please reprint the lab requisition and mail it out or have him come in to the [facility] for the lab draw? Thank you

## 2021-07-11 ENCOUNTER — Encounter: Payer: Self-pay | Admitting: Family Medicine

## 2021-07-12 LAB — GI PROFILE, STOOL, PCR

## 2021-07-22 ENCOUNTER — Other Ambulatory Visit: Payer: Self-pay

## 2021-07-22 ENCOUNTER — Ambulatory Visit: Payer: 59 | Admitting: Family Medicine

## 2021-07-22 VITALS — BP 130/60 | HR 73 | Temp 99.0°F | Wt 362.4 lb

## 2021-07-22 DIAGNOSIS — H9201 Otalgia, right ear: Secondary | ICD-10-CM | POA: Diagnosis not present

## 2021-07-22 NOTE — Progress Notes (Signed)
Established Patient Office Visit  Subjective:  Patient ID: Bradley [name], male    DOB: July 13, 1996  Age: 25 y.o. MRN: [id number]  CC:  Chief Complaint  Patient presents with   Ear Fullness    Follow up on ear pain, no longer has any pain, ear pressure, itches sometimes, no drainage    HPI Bradley [name] presents for follow-up regarding recent acute right ear pain.  He was seen here in office on 13 October and reportedly had some suppurative right otitis changes.  He was treated with Augmentin and symptoms did improve.  Still has occasional sensation of "fullness "right ear.  No hearing loss.  Occasional pain with chewing.  No fevers or chills.  No tinnitus.  No vertigo.  He did have some drainage around the middle of October but none now.  Past Medical History:  Diagnosis Date   Anxiety    Chronic headaches    Clavicular fracture    History of chickenpox    Mild intermittent asthma 06/07/2018   Shingles    25 years old.   Urticaria 05/18/2020    Past Surgical History:  Procedure Laterality Date   COLONOSCOPY     CYSTECTOMY  01/2019   scrotum area    ESOPHAGEAL MANOMETRY N/A 06/09/2020   Procedure: ESOPHAGEAL MANOMETRY (EM);  Surgeon: Lavena Bullion, DO;  Location: [facility];  Service: Gastroenterology;  Laterality: N/A;   ESOPHAGOGASTRODUODENOSCOPY     shoulder surgery Right 08/26/2019   WISDOM TOOTH EXTRACTION      Family History  Problem Relation Age of Onset   Hypertension Mother    Colon polyps Mother        might be mistakrn   Diabetes Father        type II   Migraines Sister    Colon cancer Neg Hx    Esophageal cancer Neg Hx    Rectal cancer Neg Hx    Stomach cancer Neg Hx     Social History   Socioeconomic History   Marital status: Single    Spouse name: Not on file   Number of children: 0   Years of education: College   Highest education level: Not on file  Occupational History   Occupation: Sales executive  Tobacco Use   Smoking status:  Never   Smokeless tobacco: Never  Vaping Use   Vaping Use: Never used  Substance and Sexual Activity   Alcohol use: Yes    Alcohol/week: 3.0 standard drinks    Types: 3 Standard drinks or equivalent per week    Comment: weekly   Drug use: Yes    Types: Marijuana    Comment: rare use   Sexual activity: Not on file  Other Topics Concern   Not on file  Social History Narrative   Lives alone   Caffeine use: 1 cup coffee per day   Right handed    Social Determinants of Health   Financial Resource Strain: Not on file  Food Insecurity: Not on file  Transportation Needs: Not on file  Physical Activity: Not on file  Stress: Not on file  Social Connections: Not on file  Intimate Partner Violence: Not on file    Outpatient Medications Prior to Visit  Medication Sig Dispense Refill   albuterol (VENTOLIN HFA) 108 (90 Base) MCG/ACT inhaler Inhale 2 puffs into the lungs every 6 (six) hours as needed for wheezing or shortness of breath. 18 g 0   citalopram (CELEXA) 20 MG tablet Take  1 tablet (20 mg total) by mouth daily. 90 tablet 1   clonazePAM (KLONOPIN) 0.5 MG tablet Take 1 tablet (0.5 mg total) by mouth 2 (two) times daily as needed for anxiety. 30 tablet 0   EPINEPHrine (EPIPEN IJ) Inject 1 Dose as directed as needed.     montelukast (SINGULAIR) 10 MG tablet Take 1 tablet (10 mg total) by mouth at bedtime. 30 tablet 3   omeprazole (PRILOSEC) 40 MG capsule Take 1 capsule (40 mg total) by mouth daily. 30 capsule 5   PROAIR HFA 108 (90 Base) MCG/ACT inhaler Inhale 2 puffs into the lungs every 4 (four) hours as needed for wheezing or shortness of breath. 18 g 0   sildenafil (VIAGRA) 100 MG tablet Take 0.5-1 tablets (50-100 mg total) by mouth daily as needed for erectile dysfunction. 10 tablet 11   No facility-administered medications prior to visit.    Allergies  Allergen Reactions   Shellfish Allergy Anaphylaxis   Hydrocodone Itching    Pt states he feels itchy after taking, but it  doesn't preclude him taking hydrocodone if needed About 2-3 years ago.  It was the first time he took hydrocodone.   Other Diarrhea    Slightly allergic to red meat since tick bite.Marland KitchenMarland KitchenNot anaphylaxis, but GI sensitivity.    ROS Review of Systems  Constitutional:  Negative for chills and fever.  HENT:  Negative for sore throat.        See HPI  Respiratory:  Negative for cough and shortness of breath.      Objective:    Physical Exam Vitals reviewed.  Constitutional:      Appearance: Normal appearance.  HENT:     Right Ear: Tympanic membrane and ear canal normal.     Left Ear: Tympanic membrane and ear canal normal.     Mouth/Throat:     Mouth: Mucous membranes are moist.     Pharynx: Oropharynx is clear. No oropharyngeal exudate or posterior oropharyngeal erythema.  Musculoskeletal:     Cervical back: Neck supple.  Lymphadenopathy:     Cervical: No cervical adenopathy.  Neurological:     Mental Status: He is alert.    BP 130/60 (BP Location: Left Arm, Patient Position: Sitting, Cuff Size: Normal)   Pulse 73   Temp 99 F (37.2 C) (Oral)   Wt (!) 362 lb 6.4 oz (164.4 kg)   SpO2 98%   BMI 58.49 kg/m  Wt Readings from Last 3 Encounters:  07/22/21 (!) 362 lb 6.4 oz (164.4 kg)  07/07/21 (!) 362 lb 6.4 oz (164.4 kg)  06/16/21 (!) 357 lb (161.9 kg)     Health Maintenance Due  Topic Date Due   Pneumococcal Vaccine 95-10 Years old (1 - PCV) Never done   Hepatitis C Screening  Never done   HPV VACCINES (2 - Male 3-dose series) 07/12/2018   COVID-19 Vaccine (3 - Booster for Pfizer series) 02/23/2020   INFLUENZA VACCINE  04/25/2021       Topic Date Due   HPV VACCINES (2 - Male 3-dose series) 07/12/2018    Lab Results  Component Value Date   TSH 1.120 05/20/2020   Lab Results  Component Value Date   WBC 6.0 06/03/2021   HGB 15.0 06/03/2021   HCT 45.0 06/03/2021   MCV 89.5 06/03/2021   PLT 204.0 06/03/2021   Lab Results  Component Value Date   NA 137  06/30/2020   K 3.9 06/30/2020   CO2 27 06/30/2020   GLUCOSE 88  06/30/2020   BUN 13 06/30/2020   CREATININE 0.75 06/30/2020   BILITOT 0.6 06/30/2020   ALKPHOS 62 06/30/2020   AST 25 06/30/2020   ALT 36 06/30/2020   PROT 6.6 06/30/2020   ALBUMIN 4.3 06/30/2020   CALCIUM 8.9 06/30/2020   ANIONGAP 11 10/16/2019   GFR 127.49 06/30/2020   Lab Results  Component Value Date   CHOL 132 01/21/2020   Lab Results  Component Value Date   HDL 34.00 (L) 01/21/2020   Lab Results  Component Value Date   LDLCALC 82 01/21/2020   Lab Results  Component Value Date   TRIG 79.0 01/21/2020   Lab Results  Component Value Date   CHOLHDL 4 01/21/2020   Lab Results  Component Value Date   HGBA1C 5.0 01/21/2020      Assessment & Plan:   Recent right ear symptoms.  Reported suppurative changes and patient treated with Augmentin.  Exam today is completely normal.  Eardrum appears normal with no evidence for any recent eardrum trauma.  No fluid in the canal.  No erythema.  Very mild right TMJ tenderness.  Recommend observe for now.  Follow-up for any recurrent or new symptoms  No orders of the defined types were placed in this encounter.   Follow-up: No follow-ups on file.    Carolann Littler, MD

## 2021-07-22 NOTE — Patient Instructions (Signed)
Follow-up ear exam today is normal.  No evidence for persistent otitis or any fluid in the right ear.  Follow-up for any recurrent symptoms.

## 2021-07-27 LAB — ALPHA-GAL PANEL
Allergen Lamb IgE: 0.64 kU/L — AB
Beef IgE: 0.89 kU/L — AB
IgE (Immunoglobulin E), Serum: 256 IU/mL (ref 6–495)
O215-IgE Alpha-Gal: 1.46 kU/L — AB
Pork IgE: 0.56 kU/L — AB

## 2021-07-27 NOTE — Progress Notes (Signed)
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 Please have him continue to avoid mammalian meats and have access to an epinephrine autoinjector set.  Please let him know that we can recheck this level in 6 months to a year.  Thank you

## 2021-07-28 ENCOUNTER — Encounter: Payer: Self-pay | Admitting: Family Medicine

## 2021-08-08 ENCOUNTER — Encounter: Payer: Self-pay | Admitting: Family Medicine

## 2021-08-12 ENCOUNTER — Ambulatory Visit (INDEPENDENT_AMBULATORY_CARE_PROVIDER_SITE_OTHER): Payer: 59 | Admitting: Family Medicine

## 2021-08-12 VITALS — BP 140/80 | HR 78 | Temp 98.1°F | Wt 369.9 lb

## 2021-08-12 DIAGNOSIS — L02214 Cutaneous abscess of groin: Secondary | ICD-10-CM

## 2021-08-12 DIAGNOSIS — Z91018 Allergy to other foods: Secondary | ICD-10-CM

## 2021-08-12 NOTE — Progress Notes (Signed)
Established Patient Office Visit  Subjective:  Patient ID: Bradley [name], male    DOB: May 07, 1996  Age: 25 y.o. MRN: [id number]  CC:  Chief Complaint  Patient presents with   Cyst    X 3 weeks, groin area, draining blood,     HPI Bradley [name] presents for draining "cyst "left groin region which she noticed about 3 weeks ago.  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  Saw dermatologist once and was prescribed topical clindamycin.  He generally uses Dove soap for cleansing.  He states that he used a needle and tried to unroofed the current lesion.  He has little bit of bruising where he had squeezed around this.  He states that initially this had a erythematous base with white center and some induration.  No fevers or chills.  No known history of MRSA.  He also relates alpha gal allergy.  Sometimes has digestion issues with red meat.  We performed total alpha gal IgE level here which was just minimally elevated 0.53.  He went back to allergist and this had increased up to 1.46.  At this point he is trying to avoid all red meats.  Past Medical History:  Diagnosis Date   Anxiety    Chronic headaches    Clavicular fracture    History of chickenpox    Mild intermittent asthma 06/07/2018   Shingles    25 years old.   Urticaria 05/18/2020    Past Surgical History:  Procedure Laterality Date   COLONOSCOPY     CYSTECTOMY  01/2019   scrotum area    ESOPHAGEAL MANOMETRY N/A 06/09/2020   Procedure: ESOPHAGEAL MANOMETRY (EM);  Surgeon: Lavena Bullion, DO;  Location: [facility];  Service: Gastroenterology;  Laterality: N/A;   ESOPHAGOGASTRODUODENOSCOPY     shoulder surgery Right 08/26/2019   WISDOM TOOTH EXTRACTION      Family History  Problem Relation Age of Onset   Hypertension Mother    Colon polyps Mother        might be mistakrn   Diabetes Father        type II   Migraines Sister    Colon cancer Neg Hx    Esophageal cancer Neg Hx    Rectal cancer Neg Hx     Stomach cancer Neg Hx     Social History   Socioeconomic History   Marital status: Single    Spouse name: Not on file   Number of children: 0   Years of education: College   Highest education level: Not on file  Occupational History   Occupation: Sales executive  Tobacco Use   Smoking status: Never   Smokeless tobacco: Never  Vaping Use   Vaping Use: Never used  Substance and Sexual Activity   Alcohol use: Yes    Alcohol/week: 3.0 standard drinks    Types: 3 Standard drinks or equivalent per week    Comment: weekly   Drug use: Yes    Types: Marijuana    Comment: rare use   Sexual activity: Not on file  Other Topics Concern   Not on file  Social History Narrative   Lives alone   Caffeine use: 1 cup coffee per day   Right handed    Social Determinants of Health   Financial Resource Strain: Not on file  Food Insecurity: Not on file  Transportation Needs: Not on file  Physical Activity: Not on file  Stress: Not on file  Social Connections: Not on file  Intimate Partner Violence: Not on file    Outpatient Medications Prior to Visit  Medication Sig Dispense Refill   albuterol (VENTOLIN HFA) 108 (90 Base) MCG/ACT inhaler Inhale 2 puffs into the lungs every 6 (six) hours as needed for wheezing or shortness of breath. 18 g 0   citalopram (CELEXA) 20 MG tablet Take 1 tablet (20 mg total) by mouth daily. 90 tablet 1   clonazePAM (KLONOPIN) 0.5 MG tablet Take 1 tablet (0.5 mg total) by mouth 2 (two) times daily as needed for anxiety. 30 tablet 0   EPINEPHrine (EPIPEN IJ) Inject 1 Dose as directed as needed.     montelukast (SINGULAIR) 10 MG tablet Take 1 tablet (10 mg total) by mouth at bedtime. 30 tablet 3   omeprazole (PRILOSEC) 40 MG capsule Take 1 capsule (40 mg total) by mouth daily. 30 capsule 5   PROAIR HFA 108 (90 Base) MCG/ACT inhaler Inhale 2 puffs into the lungs every 4 (four) hours as needed for wheezing or shortness of breath. 18 g 0   sildenafil (VIAGRA) 100 MG  tablet Take 0.5-1 tablets (50-100 mg total) by mouth daily as needed for erectile dysfunction. 10 tablet 11   No facility-administered medications prior to visit.    Allergies  Allergen Reactions   Shellfish Allergy Anaphylaxis   Hydrocodone Itching    Pt states he feels itchy after taking, but it doesn't preclude him taking hydrocodone if needed About 2-3 years ago.  It was the first time he took hydrocodone.   Other Diarrhea    Slightly allergic to red meat since tick bite.Marland KitchenMarland KitchenNot anaphylaxis, but GI sensitivity.    ROS Review of Systems  Constitutional:  Negative for chills and fever.     Objective:    Physical Exam Vitals reviewed.  Cardiovascular:     Rate and Rhythm: Normal rate and regular rhythm.  Pulmonary:     Effort: Pulmonary effort is normal.     Breath sounds: Normal breath sounds.  Skin:    Comments: Left groin region reveals area of ecchymosis about 2 x 2 cm.  Minimal induration near the center.  No pustules.  No erythema.  No visible drainage at this time.  Neurological:     Mental Status: He is alert.    BP 140/80 (BP Location: Left Arm, Patient Position: Sitting, Cuff Size: Normal)   Pulse 78   Temp 98.1 F (36.7 C) (Oral)   Wt (!) 369 lb 14.4 oz (167.8 kg)   SpO2 98%   BMI 59.70 kg/m  Wt Readings from Last 3 Encounters:  08/12/21 (!) 369 lb 14.4 oz (167.8 kg)  07/22/21 (!) 362 lb 6.4 oz (164.4 kg)  07/07/21 (!) 362 lb 6.4 oz (164.4 kg)     Health Maintenance Due  Topic Date Due   Pneumococcal Vaccine 21-59 Years old (1 - PCV) Never done   Hepatitis C Screening  Never done   HPV VACCINES (2 - Male 3-dose series) 07/12/2018   COVID-19 Vaccine (3 - Booster for Pfizer series) 02/23/2020   INFLUENZA VACCINE  04/25/2021       Topic Date Due   HPV VACCINES (2 - Male 3-dose series) 07/12/2018    Lab Results  Component Value Date   TSH 1.120 05/20/2020   Lab Results  Component Value Date   WBC 6.0 06/03/2021   HGB 15.0 06/03/2021   HCT  45.0 06/03/2021   MCV 89.5 06/03/2021   PLT 204.0 06/03/2021   Lab Results  Component Value Date  NA 137 06/30/2020   K 3.9 06/30/2020   CO2 27 06/30/2020   GLUCOSE 88 06/30/2020   BUN 13 06/30/2020   CREATININE 0.75 06/30/2020   BILITOT 0.6 06/30/2020   ALKPHOS 62 06/30/2020   AST 25 06/30/2020   ALT 36 06/30/2020   PROT 6.6 06/30/2020   ALBUMIN 4.3 06/30/2020   CALCIUM 8.9 06/30/2020   ANIONGAP 11 10/16/2019   GFR 127.49 06/30/2020   Lab Results  Component Value Date   CHOL 132 01/21/2020   Lab Results  Component Value Date   HDL 34.00 (L) 01/21/2020   Lab Results  Component Value Date   LDLCALC 82 01/21/2020   Lab Results  Component Value Date   TRIG 79.0 01/21/2020   Lab Results  Component Value Date   CHOLHDL 4 01/21/2020   Lab Results  Component Value Date   HGBA1C 5.0 01/21/2020      Assessment & Plan:   #1 resolving abscess left groin region.  -No indication for I&D at this time. -Consider good antibacterial soap such as Lever 2000 and or Dial -Follow-up for any erythema or other concerns  #2 history of alpha gal allergy.  Strict avoidance of red meats  No orders of the defined types were placed in this encounter.   Follow-up: No follow-ups on file.    Carolann Littler, MD

## 2021-09-02 ENCOUNTER — Ambulatory Visit: Payer: 59 | Admitting: Family Medicine

## 2021-09-02 VITALS — BP 120/70 | HR 76 | Temp 98.6°F | Wt 369.3 lb

## 2021-09-02 DIAGNOSIS — H9201 Otalgia, right ear: Secondary | ICD-10-CM | POA: Diagnosis not present

## 2021-09-02 NOTE — Patient Instructions (Signed)
Keep fluid out ear  Consider 50:50 mixture of white vinegar and alcohol and use a few drops twice daily as needed.

## 2021-09-02 NOTE — Progress Notes (Signed)
Established Patient Office Visit  Subjective:  Patient ID: Bradley [name], male    DOB: [birth year]  Age: 25 y.o. MRN: [id number]  CC:  Chief Complaint  Patient presents with   Ear Pain    HPI Bradley [name] presents for right ear pain earlier this week.  Slightly improved today.  No drainage.  No acute hearing changes.  Did not see any purulent drainage.  He had little bit of wax that he got out earlier in the week.  He has had chronic ear infections in the past.  No fever.  No recent swimming.  Past Medical History:  Diagnosis Date   Anxiety    Chronic headaches    Clavicular fracture    History of chickenpox    Mild intermittent asthma 06/07/2018   Shingles    25 years old.   Urticaria 05/18/2020    Past Surgical History:  Procedure Laterality Date   COLONOSCOPY     CYSTECTOMY  01/2019   scrotum area    ESOPHAGEAL MANOMETRY N/A 06/09/2020   Procedure: ESOPHAGEAL MANOMETRY (EM);  Surgeon: Lavena Bullion, DO;  Location: [facility];  Service: Gastroenterology;  Laterality: N/A;   ESOPHAGOGASTRODUODENOSCOPY     shoulder surgery Right 08/26/2019   WISDOM TOOTH EXTRACTION      Family History  Problem Relation Age of Onset   Hypertension Mother    Colon polyps Mother        might be mistakrn   Diabetes Father        type II   Migraines Sister    Colon cancer Neg Hx    Esophageal cancer Neg Hx    Rectal cancer Neg Hx    Stomach cancer Neg Hx     Social History   Socioeconomic History   Marital status: Single    Spouse name: Not on file   Number of children: 0   Years of education: College   Highest education level: Not on file  Occupational History   Occupation: Sales executive  Tobacco Use   Smoking status: Never   Smokeless tobacco: Never  Vaping Use   Vaping Use: Never used  Substance and Sexual Activity   Alcohol use: Yes    Alcohol/week: 3.0 standard drinks    Types: 3 Standard drinks or equivalent per week    Comment: weekly   Drug use: Yes     Types: Marijuana    Comment: rare use   Sexual activity: Not on file  Other Topics Concern   Not on file  Social History Narrative   Lives alone   Caffeine use: 1 cup coffee per day   Right handed    Social Determinants of Health   Financial Resource Strain: Not on file  Food Insecurity: Not on file  Transportation Needs: Not on file  Physical Activity: Not on file  Stress: Not on file  Social Connections: Not on file  Intimate Partner Violence: Not on file    Outpatient Medications Prior to Visit  Medication Sig Dispense Refill   albuterol (VENTOLIN HFA) 108 (90 Base) MCG/ACT inhaler Inhale 2 puffs into the lungs every 6 (six) hours as needed for wheezing or shortness of breath. 18 g 0   citalopram (CELEXA) 20 MG tablet Take 1 tablet (20 mg total) by mouth daily. 90 tablet 1   clonazePAM (KLONOPIN) 0.5 MG tablet Take 1 tablet (0.5 mg total) by mouth 2 (two) times daily as needed for anxiety. 30 tablet 0   EPINEPHrine (  EPIPEN IJ) Inject 1 Dose as directed as needed.     montelukast (SINGULAIR) 10 MG tablet Take 1 tablet (10 mg total) by mouth at bedtime. 30 tablet 3   omeprazole (PRILOSEC) 40 MG capsule Take 1 capsule (40 mg total) by mouth daily. 30 capsule 5   PROAIR HFA 108 (90 Base) MCG/ACT inhaler Inhale 2 puffs into the lungs every 4 (four) hours as needed for wheezing or shortness of breath. 18 g 0   sildenafil (VIAGRA) 100 MG tablet Take 0.5-1 tablets (50-100 mg total) by mouth daily as needed for erectile dysfunction. 10 tablet 11   No facility-administered medications prior to visit.    Allergies  Allergen Reactions   Shellfish Allergy Anaphylaxis   Hydrocodone Itching    Pt states he feels itchy after taking, but it doesn't preclude him taking hydrocodone if needed About 2-3 years ago.  It was the first time he took hydrocodone.   Other Diarrhea    Slightly allergic to red meat since tick bite.Marland KitchenMarland KitchenNot anaphylaxis, but GI sensitivity.    ROS Review of Systems   Constitutional:  Negative for chills and fever.  HENT:  Positive for ear pain. Negative for ear discharge.      Objective:    Physical Exam Vitals reviewed.  HENT:     Ears:     Comments: Right and left eardrums appear normal.  Right canal reveals minimal cerumen.  Minimal erythema deep in the canal.  No purulent drainage. Neurological:     Mental Status: He is alert.    BP 120/70 (BP Location: Left Arm, Patient Position: Sitting, Cuff Size: Normal)   Pulse 76   Temp 98.6 F (37 C) (Oral)   Wt (!) 369 lb 4.8 oz (167.5 kg)   SpO2 98%   BMI 59.61 kg/m  Wt Readings from Last 3 Encounters:  09/02/21 (!) 369 lb 4.8 oz (167.5 kg)  08/12/21 (!) 369 lb 14.4 oz (167.8 kg)  07/22/21 (!) 362 lb 6.4 oz (164.4 kg)     Health Maintenance Due  Topic Date Due   Pneumococcal Vaccine 81-58 Years old (1 - PCV) Never done   Hepatitis C Screening  Never done   HPV VACCINES (2 - Male 3-dose series) 07/12/2018   COVID-19 Vaccine (3 - Booster for Pfizer series) 02/23/2020   INFLUENZA VACCINE  04/25/2021       Topic Date Due   HPV VACCINES (2 - Male 3-dose series) 07/12/2018    Lab Results  Component Value Date   TSH 1.120 05/20/2020   Lab Results  Component Value Date   WBC 6.0 06/03/2021   HGB 15.0 06/03/2021   HCT 45.0 06/03/2021   MCV 89.5 06/03/2021   PLT 204.0 06/03/2021   Lab Results  Component Value Date   NA 137 06/30/2020   K 3.9 06/30/2020   CO2 27 06/30/2020   GLUCOSE 88 06/30/2020   BUN 13 06/30/2020   CREATININE 0.75 06/30/2020   BILITOT 0.6 06/30/2020   ALKPHOS 62 06/30/2020   AST 25 06/30/2020   ALT 36 06/30/2020   PROT 6.6 06/30/2020   ALBUMIN 4.3 06/30/2020   CALCIUM 8.9 06/30/2020   ANIONGAP 11 10/16/2019   GFR 127.49 06/30/2020   Lab Results  Component Value Date   CHOL 132 01/21/2020   Lab Results  Component Value Date   HDL 34.00 (L) 01/21/2020   Lab Results  Component Value Date   LDLCALC 82 01/21/2020   Lab Results  Component  Value Date  TRIG 79.0 01/21/2020   Lab Results  Component Value Date   CHOLHDL 4 01/21/2020   Lab Results  Component Value Date   HGBA1C 5.0 01/21/2020      Assessment & Plan:   Right otalgia.  No evidence for otitis media.  May have very early evidence for otitis externa with minimal erythema but no exudate.  -Keep ear dry and observe for now.  He will consider future use of 50-50 mix of white vinegar and alcohol for otitis externa prevention -Touch base for any progressive or persistent symptoms  No orders of the defined types were placed in this encounter.   Follow-up: No follow-ups on file.    Carolann Littler, MD

## 2021-09-05 ENCOUNTER — Encounter: Payer: Self-pay | Admitting: Family Medicine

## 2021-09-05 ENCOUNTER — Other Ambulatory Visit: Payer: Self-pay | Admitting: *Deleted

## 2021-09-05 MED ORDER — AUVI-Q 0.3 MG/0.3ML IJ SOAJ: 0.3000 mg | Freq: Once | INTRAMUSCULAR | 1 refills | Status: AC

## 2021-09-06 DIAGNOSIS — K1379 Other lesions of oral mucosa: Secondary | ICD-10-CM | POA: Insufficient documentation

## 2021-09-23 ENCOUNTER — Telehealth (INDEPENDENT_AMBULATORY_CARE_PROVIDER_SITE_OTHER): Payer: 59 | Admitting: Family Medicine

## 2021-09-23 ENCOUNTER — Encounter: Payer: Self-pay | Admitting: Family Medicine

## 2021-09-23 DIAGNOSIS — R197 Diarrhea, unspecified: Secondary | ICD-10-CM

## 2021-09-23 NOTE — Progress Notes (Signed)
Patient ID: Bradley [name], male   DOB: 05-12-1996, 25 y.o.   MRN: [id number]  This visit type was conducted due to national recommendations for restrictions regarding the COVID-19 pandemic in an effort to limit this patient's exposure and mitigate transmission in our community.   Virtual Visit via Telephone Note  I connected with Bradley [name] on 09/23/21 at  2:15 PM EST by telephone and verified that I am speaking with the correct person using two identifiers.   I discussed the limitations, risks, security and privacy concerns of performing an evaluation and management service by telephone and the availability of in person appointments. I also discussed with the patient that there may be a patient responsible charge related to this service. The patient expressed understanding and agreed to proceed.  Location patient: home Location provider: work or home office Participants present for the call: patient, provider Patient did not have a visit in the prior 7 days to address this/these issue(s).   History of Present Illness:  Bradley [name] relates about 3 weeks of loose stools.  Usually about 3/day.  He had history of similar symptoms in the past.  Has been diagnosed in the past with IBS.  He had colonoscopy 2020 which showed no acute findings.  No bloody stools.  Did recently acquire new pet with a dog.  His dog apparently had Giardia.  Bradley [name] denies any fever or any associated abdominal pain.  He has taken occasional Imodium which does seem to help.  No history of known celiac disease or lactose intolerance.  No recent dietary changes.  No nausea or vomiting.  No recent antibiotic.  Past Medical History:  Diagnosis Date   Anxiety    Chronic headaches    Clavicular fracture    History of chickenpox    Mild intermittent asthma 06/07/2018   Shingles    25 years old.   Urticaria 05/18/2020   Past Surgical History:  Procedure Laterality Date   COLONOSCOPY     CYSTECTOMY  01/2019   scrotum area     ESOPHAGEAL MANOMETRY N/A 06/09/2020   Procedure: ESOPHAGEAL MANOMETRY (EM);  Surgeon: Lavena Bullion, DO;  Location: [facility];  Service: Gastroenterology;  Laterality: N/A;   ESOPHAGOGASTRODUODENOSCOPY     shoulder surgery Right 08/26/2019   WISDOM TOOTH EXTRACTION      reports that he has never smoked. He has never used smokeless tobacco. He reports current alcohol use of about 3.0 standard drinks per week. He reports current drug use. Drug: Marijuana. family history includes Colon polyps in his mother; Diabetes in his father; Hypertension in his mother; Migraines in his sister. Allergies  Allergen Reactions   Shellfish Allergy Anaphylaxis   Hydrocodone Itching    Pt states he feels itchy after taking, but it doesn't preclude him taking hydrocodone if needed About 2-3 years ago.  It was the first time he took hydrocodone.   Other Diarrhea    Slightly allergic to red meat since tick bite.Marland KitchenMarland KitchenNot anaphylaxis, but GI sensitivity.      Observations/Objective: Patient sounds cheerful and well on the phone. I do not appreciate any SOB. Speech and thought processing are grossly intact. Patient reported vitals:  Assessment and Plan:  Frequent loose stools.  History of IBS.  Doubt Giardia or other significant pathogen based on history.  -Continue Imodium as needed -Follow-up immediately for any bloody stools, fever, or any progressive diarrhea in terms of watery nature or increased number of stools  Follow Up Instructions:   99441 5-10 [phone]  11-20 99443 21-30 I did not refer this patient for an OV in the next 24 hours for this/these issue(s).  I discussed the assessment and treatment plan with the patient. The patient was provided an opportunity to ask questions and all were answered. The patient agreed with the plan and demonstrated an understanding of the instructions.   The patient was advised to call back or seek an in-person evaluation if the symptoms worsen or if the  condition fails to improve as anticipated.  I provided 12 minutes of non-face-to-face time during this encounter.   Carolann Littler, MD

## 2021-09-26 ENCOUNTER — Other Ambulatory Visit: Payer: Self-pay

## 2021-09-28 ENCOUNTER — Other Ambulatory Visit: Payer: Self-pay | Admitting: *Deleted

## 2021-09-28 MED ORDER — PROAIR HFA 108 (90 BASE) MCG/ACT IN AERS: 2.0000 | INHALATION_SPRAY | RESPIRATORY_TRACT | 1 refills | Status: DC | PRN

## 2021-09-29 ENCOUNTER — Telehealth: Payer: Self-pay

## 2021-09-29 ENCOUNTER — Other Ambulatory Visit: Payer: Self-pay | Admitting: Family Medicine

## 2021-09-29 ENCOUNTER — Encounter: Payer: Self-pay | Admitting: Family Medicine

## 2021-09-29 NOTE — Telephone Encounter (Signed)
Patient called stating the inhaler needs to be sent in a generic.  Please Advise

## 2021-09-29 NOTE — Telephone Encounter (Signed)
--  Caller states he believes he was bit by a spider on his stomach as there is a little bump or a boil. Rash around the area is about size of a quarter.  09/29/2021 9:48:08 AM Clinical Call Bradley Daft, RN, Sherre Poot  --He is on his way to dermatologist. He was able to get an appt this am.

## 2021-09-30 ENCOUNTER — Other Ambulatory Visit: Payer: Self-pay

## 2021-09-30 ENCOUNTER — Ambulatory Visit (INDEPENDENT_AMBULATORY_CARE_PROVIDER_SITE_OTHER): Payer: 59 | Admitting: Family Medicine

## 2021-09-30 ENCOUNTER — Encounter: Payer: Self-pay | Admitting: Family Medicine

## 2021-09-30 VITALS — BP 128/62 | HR 96 | Temp 98.3°F | Resp 16 | Ht 78.0 in | Wt 375.0 lb

## 2021-09-30 DIAGNOSIS — K219 Gastro-esophageal reflux disease without esophagitis: Secondary | ICD-10-CM

## 2021-09-30 DIAGNOSIS — L509 Urticaria, unspecified: Secondary | ICD-10-CM | POA: Diagnosis not present

## 2021-09-30 DIAGNOSIS — J4540 Moderate persistent asthma, uncomplicated: Secondary | ICD-10-CM | POA: Diagnosis not present

## 2021-09-30 DIAGNOSIS — H1010 Acute atopic conjunctivitis, unspecified eye: Secondary | ICD-10-CM

## 2021-09-30 DIAGNOSIS — J302 Other seasonal allergic rhinitis: Secondary | ICD-10-CM | POA: Diagnosis not present

## 2021-09-30 DIAGNOSIS — Z91018 Allergy to other foods: Secondary | ICD-10-CM

## 2021-09-30 DIAGNOSIS — H1013 Acute atopic conjunctivitis, bilateral: Secondary | ICD-10-CM

## 2021-09-30 DIAGNOSIS — T7800XD Anaphylactic reaction due to unspecified food, subsequent encounter: Secondary | ICD-10-CM

## 2021-09-30 DIAGNOSIS — J3089 Other allergic rhinitis: Secondary | ICD-10-CM

## 2021-09-30 MED ORDER — ALVESCO 160 MCG/ACT IN AERS: 2.0000 | INHALATION_SPRAY | Freq: Two times a day (BID) | RESPIRATORY_TRACT | 5 refills | Status: DC

## 2021-09-30 MED ORDER — DICYCLOMINE HCL 20 MG PO TABS: 20.0000 mg | ORAL_TABLET | Freq: Three times a day (TID) | ORAL | 0 refills | Status: DC

## 2021-09-30 MED ORDER — OMEPRAZOLE 40 MG PO CPDR: 40.0000 mg | DELAYED_RELEASE_CAPSULE | Freq: Every day | ORAL | 5 refills | Status: DC

## 2021-09-30 MED ORDER — AZELASTINE HCL 0.1 % NA SOLN: 2.0000 | Freq: Two times a day (BID) | NASAL | 5 refills | Status: DC | PRN

## 2021-09-30 MED ORDER — MONTELUKAST SODIUM 10 MG PO TABS: 10.0000 mg | ORAL_TABLET | Freq: Every day | ORAL | 5 refills | Status: DC

## 2021-09-30 NOTE — Patient Instructions (Addendum)
Asthma Begin Alvesco 160-2 puffs twice a day for for asthma control Continue montelukast 10 mg once a day to prevent cough or wheeze Continue albuterol 2 puffs every 4 hours as needed for cough, wheeze, tightness in chest, or shortness of breath  Reflux Continue dietary and lifestyle modifications Begin omeprazole 40 mg once a day to prevent cough or wheeze  Allergic rhinitis Continue avoidance measures directed toward mold, cockroach, pollens, dog, and cat as listed below Begin Flonase 2 sprays in each nostril once a day as needed for a stuffy nose.  In the right nostril, point the applicator out toward the right ear. In the left nostril, point the applicator out toward the left ear Continue levocetirizine 5 mg once a day as needed for a runny nose or itch. Remember to rotate to a different antihistamine about every 3 months. Some examples of over the counter antihistamines include Zyrtec (cetirizine), Xyzal (levocetirizine), Allegra (fexofenadine), and Claritin (loratidine).  Continue Azelastine 2 sprays in each nostril twice a day as needed for a runny nose Consider saline nasal rinses as needed for nasal symptoms. 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 In case of an allergic reaction, take Benadryl 50 mg every 4 hours, and if life-threatening symptoms occur, inject with EpiPen 0.3 mg. We will place an order to recheck your alpha gal levels.  We will call you when the results become available  Food allergy Continue to avoid shellfish. In case of an allergic reaction, take Benadryl 50 mg every 4 hours, and if life-threatening symptoms occur, inject with AuviQ 0.3 mg. Return to the clinic if you are interested in reevaluating your food allergy to shellfish.  Remember to stop antihistamines for 3 days before your testing appointment.  Hives Take the least amount of medications while remaining hive  free Levocetirizine (Xyzal) 5 mg twice a day and famotidine (Pepcid) 20 mg twice a day. If no symptoms for 7-14 days then decrease to Levocetirizine (Xyzal) 5 mg twice a day and famotidine (Pepcid) 20 mg once a day.  If no symptoms for 7-14 days then decrease to Levocetirizine (Xyzal) 5 mg twice a day.  If no symptoms for 7-14 days then decrease to Levocetirizine (Xyzal) 5 mg once a day.   May use Benadryl (diphenhydramine) as needed for breakthrough hives       If symptoms return, then step up dosage Keep a detailed symptom journal including foods eaten, contact with allergens, medications taken, weather changes.  Call the clinic if this treatment plan is not working well for you  Follow up in 2 months or sooner if needed.  Reducing Pollen Exposure The American Academy of Allergy, Asthma and Immunology suggests the following steps to reduce your exposure to pollen during allergy seasons. Do not hang sheets or clothing out to dry; pollen may collect on these items. Do not mow lawns or spend time around freshly cut grass; mowing stirs up pollen. Keep windows closed at night.  Keep car windows closed while driving. Minimize morning activities outdoors, a time when pollen counts are usually at their highest. Stay indoors as much as possible when pollen counts or humidity is high and on windy days when pollen tends to remain in the air longer. Use air conditioning when possible.  Many air conditioners have filters that trap the pollen spores. Use a HEPA room air filter to remove pollen form the indoor air you breathe.  Control of Mold Allergen Mold and fungi can grow on a variety of surfaces provided certain temperature and moisture conditions exist.  Outdoor molds grow on plants, decaying vegetation and soil.  The major outdoor mold, Alternaria and Cladosporium, are found in very high numbers during hot and dry conditions.  Generally, a late Summer - Fall peak is seen for common outdoor  fungal spores.  Rain will temporarily lower outdoor mold spore count, but counts rise rapidly when the rainy period ends.  The most important indoor molds are Aspergillus and Penicillium.  Dark, humid and poorly ventilated basements are ideal sites for mold growth.  The next most common sites of mold growth are the bathroom and the kitchen.  Outdoor [employer] Use air conditioning and keep windows closed Avoid exposure to decaying vegetation. Avoid leaf raking. Avoid grain handling. Consider wearing a face mask if working in moldy areas.  Indoor Mold Control Maintain humidity below 50%. Clean washable surfaces with 5% bleach solution. Remove sources e.g. Contaminated carpets.  Control of Dog or Cat Allergen Avoidance is the best way to manage a dog or cat allergy. If you have a dog or cat and are allergic to dog or cats, consider removing the dog or cat from the home. If you have a dog or cat but dont want to find it a new home, or if your family wants a pet even though someone in the household is allergic, here are some strategies that may help keep symptoms at bay:  Keep the pet out of your bedroom and restrict it to only a few rooms. Be advised that keeping the dog or cat in only one room will not limit the allergens to that room. Dont pet, hug or kiss the dog or cat; if you do, wash your hands with soap and water. High-efficiency particulate air (HEPA) cleaners run continuously in a bedroom or living room can reduce allergen levels over time. Regular use of a high-efficiency vacuum cleaner or a central vacuum can reduce allergen levels. Giving your dog or cat a bath at least once a week can reduce airborne allergen.

## 2021-09-30 NOTE — Progress Notes (Signed)
[address] [place] [address] Dept: [phone]  FOLLOW UP NOTE  Patient ID: Bradley [name], male    DOB: July 22, 1996  Age: 26 y.o. MRN: [id number] Date of Office Visit: 09/30/2021  Assessment  Chief Complaint: Asthma (Most part pretty good. Wheezing in the morning when he sits up in the bed. /ACT 19) and Allergic Rhinitis  (Doing good.)  HPI Bradley [name] is a 26 year old male who presents to the clinic for follow-up visit.  He was last seen in this clinic on 06/16/2021 by Gareth Morgan, FNP, for evaluation of shortness of breath, allergic rhinitis, chronic urticaria, alpha gal allergy, reflux, and food allergy to shellfish. At today's visit, he reports his asthma has been moderately well controlled with some shortness of breath occurring upon waking in the morning as well as wheezing occurring in the daytime and nighttime for which he uses albuterol with relief of symptoms.  He continues montelukast 10 mg once a day and uses albuterol about 2 days a week with relief of symptoms.  Allergic rhinitis is reported as moderately well controlled with symptoms including clear rhinorrhea that occurs year-round and increases in the spring as well as intermittent postnasal drainage.  He continues Xyzal 5 mg once a day and is not using saline nasal rinses, Flonase, or azelastine. His last environmental testing was on 01/18/2018 via blood work was positive to pollens, mold, cockroach, cat, and dog.  Allergic conjunctivitis is reported as moderately well controlled with red and itchy eyes for which he uses Restasis.  He continues to avoid mammal products with no accidental ingestion or EpiPen use since his last visit to this clinic.  He has not had an urticarial breakout since he has been avoiding mammal products.  Reflux is reported as moderately well controlled with heartburn occurring about once a day a week.  He is not currently using omeprazole.  He continues to avoid shellfish with no accidental  ingestion or EpiPen use since his last visit to this clinic. His last food allergy testing was on 01/18/2018 via blood work and was positive to shellfish. His current medications are listed in the chart.  Drug Allergies:  Allergies  Allergen Reactions   Shellfish Allergy Anaphylaxis   Hydrocodone Itching    Pt states he feels itchy after taking, but it doesn't preclude him taking hydrocodone if needed About 2-3 years ago.  It was the first time he took hydrocodone.   Other Diarrhea    Slightly allergic to red meat since tick bite.Marland KitchenMarland KitchenNot anaphylaxis, but GI sensitivity.    Physical Exam: BP 128/62 (Cuff Size: Large)    Pulse 96    Temp 98.3 F (36.8 C) (Temporal)    Resp 16    Ht 6\' 6"  (1.981 m)    Wt (!) 375 lb (170.1 kg)    SpO2 97%    BMI 43.34 kg/m    Physical Exam Vitals reviewed.  Constitutional:      Appearance: Normal appearance.  HENT:     Head: Normocephalic and atraumatic.     Right Ear: Tympanic membrane normal.     Left Ear: Tympanic membrane normal.     Nose:     Comments: Bilateral nares slightly erythematous with clear nasal drainage noted.  Pharynx normal.  Ears normal.  Eyes normal.    Mouth/Throat:     Pharynx: Oropharynx is clear.  Eyes:     Conjunctiva/sclera: Conjunctivae normal.  Cardiovascular:     Rate and Rhythm: Normal rate and regular  rhythm.     Heart sounds: Normal heart sounds. No murmur heard. Pulmonary:     Effort: Pulmonary effort is normal.     Breath sounds: Normal breath sounds.     Comments: Bilateral expiratory wheeze which cleared post bronchodilator therapy Musculoskeletal:        General: Normal range of motion.     Cervical back: Normal range of motion and neck supple.  Skin:    General: Skin is warm and dry.  Neurological:     Mental Status: He is alert and oriented to person, place, and time.  Psychiatric:        Mood and Affect: Mood normal.        Behavior: Behavior normal.        Thought Content: Thought content normal.         Judgment: Judgment normal.    Diagnostics: FVC 5.25, FEV1 4.09.  Predicted FVC 7.16, predicted FEV1 5.84.  Spirometry indicates possible mild restriction.  Postbronchodilator therapy FVC 5.63, FEV1 4.59.  Postbronchodilator therapy indicates 12% improvement in FEV1 and 7% improvement in FVC.  Assessment and Plan: 1. Not well controlled moderate persistent asthma   2. Seasonal and perennial allergic rhinoconjunctivitis   3. Urticaria   4. Allergy to alpha-gal   5. Anaphylactic shock due to food, subsequent encounter   6. Gastroesophageal reflux disease, unspecified whether esophagitis present     Meds ordered this encounter  Medications   ciclesonide (ALVESCO) 160 MCG/ACT inhaler    Sig: Inhale 2 puffs into the lungs 2 (two) times daily.    Dispense:  1 each    Refill:  5    Rxbin: [id number] RxPCN: [id number] RxGRP: [id number] Issuer: ([id number]) ID#: [id number]   montelukast (SINGULAIR) 10 MG tablet    Sig: Take 1 tablet (10 mg total) by mouth at bedtime.    Dispense:  30 tablet    Refill:  5   omeprazole (PRILOSEC) 40 MG capsule    Sig: Take 1 capsule (40 mg total) by mouth daily.    Dispense:  30 capsule    Refill:  5   azelastine (ASTELIN) 0.1 % nasal spray    Sig: Place 2 sprays into both nostrils 2 (two) times daily as needed for rhinitis. Use in each nostril as directed    Dispense:  30 mL    Refill:  5    Patient Instructions  Asthma Begin Alvesco 160-2 puffs twice a day for for asthma control Continue montelukast 10 mg once a day to prevent cough or wheeze Continue albuterol 2 puffs every 4 hours as needed for cough, wheeze, tightness in chest, or shortness of breath  Reflux Continue dietary and lifestyle modifications Begin omeprazole 40 mg once a day to prevent cough or wheeze  Allergic rhinitis Continue avoidance measures directed toward mold, cockroach, pollens, dog, and cat as listed below Begin Flonase 2 sprays in each nostril once a day as needed for a stuffy  nose.  In the right nostril, point the applicator out toward the right ear. In the left nostril, point the applicator out toward the left ear Continue levocetirizine 5 mg once a day as needed for a runny nose or itch. Remember to rotate to a different antihistamine about every 3 months. Some examples of over the counter antihistamines include Zyrtec (cetirizine), Xyzal (levocetirizine), Allegra (fexofenadine), and Claritin (loratidine).  Continue Azelastine 2 sprays in each nostril twice a day as needed for a runny nose Consider saline nasal rinses as needed for  nasal symptoms. 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 In case of an allergic reaction, take Benadryl 50 mg every 4 hours, and if life-threatening symptoms occur, inject with EpiPen 0.3 mg. We will place an order to recheck your alpha gal levels.  We will call you when the results become available  Food allergy Continue to avoid shellfish. In case of an allergic reaction, take Benadryl 50 mg every 4 hours, and if life-threatening symptoms occur, inject with AuviQ 0.3 mg. Return to the clinic if you are interested in reevaluating your food allergy to shellfish.  Remember to stop antihistamines for 3 days before your testing appointment.  Hives Take the least amount of medications while remaining hive free Levocetirizine (Xyzal) 5 mg twice a day and famotidine (Pepcid) 20 mg twice a day. If no symptoms for 7-14 days then decrease to Levocetirizine (Xyzal) 5 mg twice a day and famotidine (Pepcid) 20 mg once a day.  If no symptoms for 7-14 days then decrease to Levocetirizine (Xyzal) 5 mg twice a day.  If no symptoms for 7-14 days then decrease to Levocetirizine (Xyzal) 5 mg once a day.   May use Benadryl (diphenhydramine) as needed for breakthrough hives       If symptoms return, then step up dosage Keep a detailed symptom journal including  foods eaten, contact with allergens, medications taken, weather changes.  Call the clinic if this treatment plan is not working well for you  Follow up in 2 months or sooner if needed.   Return in about 2 months (around 11/28/2021), or if symptoms worsen or fail to improve.    Thank you for the opportunity to care for this patient.  Please do not hesitate to contact me with questions.  Gareth Morgan, FNP [facility] [place]

## 2021-10-05 ENCOUNTER — Ambulatory Visit (INDEPENDENT_AMBULATORY_CARE_PROVIDER_SITE_OTHER): Payer: 59

## 2021-10-05 ENCOUNTER — Ambulatory Visit: Payer: 59 | Admitting: Family Medicine

## 2021-10-05 ENCOUNTER — Other Ambulatory Visit: Payer: Self-pay

## 2021-10-05 VITALS — BP 128/84 | HR 75 | Ht 78.0 in | Wt 372.0 lb

## 2021-10-05 DIAGNOSIS — M79672 Pain in left foot: Secondary | ICD-10-CM

## 2021-10-05 NOTE — Patient Instructions (Signed)
Thank you for coming in today.   Cavus foot.   [email or web address] Scaphoid pad size medium   Please get an Xray today before you leave   If improving  post op shoe

## 2021-10-05 NOTE — Progress Notes (Signed)
I, Peterson Lombard, LAT, ATC acting as a scribe for Lynne Leader, MD.  Bradley [name] is a 26 y.o. male who presents to [place] [facility] today for L foot pain. Pt was previously seen by Dr. Georgina Snell on 06/15/20 for R ankle pain. Today, pt c/o L foot pain ongoing for 1-2 weeks w/ no known MOI. Pt "broke" his R great toe 3 weeks ago and possibly attributes compensating to the pain in his L foot. Pt locates pain to medial aspect of the 1st MT and into the medial arch.   L foot swelling: no Aggravates: toe flexion, walking, first few step walking after transitioning from rest Treatments tried: rest  Dx testing:06/21/20 R LE venous doppler US   Pertinent review of systems: No fevers or chills  Relevant historical information: Obesity   Exam:  BP 128/84    Pulse 75    Ht 6\' 6"  (1.981 m)    Wt (!) 372 lb (168.7 kg)    SpO2 97%    BMI 42.99 kg/m  General: Well Developed, well nourished, and in no acute distress.   MSK: Left foot and ankle.  Almost cavus appearing foot with high arch.  Mild tender palpation medial mid arch.  Some pain with forefoot rotation.  Foot and ankle motion are intact without pain.  Strength testing is intact.    Lab and Radiology Results No results found for this or any previous visit (from the past 72 hour(s)). DG Foot Complete Left  Result Date: 10/05/2021 CLINICAL DATA:  Left foot pain. EXAM: LEFT FOOT - COMPLETE 3+ VIEW COMPARISON:  None. FINDINGS: There is no acute fracture or dislocation. The bones are well mineralized. There is widening of the webspaces between the third and fourth as well as fourth and fifth metatarsals. This may be congenital. However, an infiltrative process or a lipoma is not excluded. MRI may provide better evaluation if clinically indicated. IMPRESSION: No acute fracture or dislocation. Electronically Signed   By: Anner Crete M.D.   On: 10/05/2021 22:02   I, Lynne Leader, personally (independently) visualized and  performed the interpretation of the images attached in this note.     Assessment and Plan: 26 y.o. male with left foot pain.  He injured his right foot and I think a lot of his pain is due to excessive use of his left foot to offload his right.  His excessively high arch puts his mid arch structures at increased risk of injury with excessive loading.  I fitted him with a scaphoid pad which she found immediately helpful.  I think with proper arch support he is probably can feel better.  Plan for good arch support and some basic exercises focused on posterior tibialis and tendons along with Voltaren gel. If not improving certainly more to do.  PDMP not reviewed this encounter. Orders Placed This Encounter  Procedures   DG Foot Complete Left    Standing Status:   Future    Number of Occurrences:   1    Standing Expiration Date:   10/05/2022    Order Specific Question:   Reason for Exam (SYMPTOM  OR DIAGNOSIS REQUIRED)    Answer:   eval left foot pain    Order Specific Question:   Preferred imaging location?    Answer:   [facility]   No orders of the defined types were placed in this encounter.    Discussed warning signs or symptoms. Please see discharge instructions.  Patient expresses understanding.   The above documentation has been reviewed and is accurate and complete Lynne Leader, M.D.

## 2021-10-06 ENCOUNTER — Ambulatory Visit: Payer: 59 | Admitting: Family Medicine

## 2021-10-06 NOTE — Progress Notes (Signed)
Left foot xray does not show fracture or significant arthritis.

## 2021-10-13 ENCOUNTER — Other Ambulatory Visit: Payer: 59

## 2021-10-13 ENCOUNTER — Encounter: Payer: Self-pay | Admitting: Nurse Practitioner

## 2021-10-13 ENCOUNTER — Ambulatory Visit: Payer: 59 | Admitting: Nurse Practitioner

## 2021-10-13 VITALS — BP 120/74 | HR 83 | Ht 78.0 in | Wt 376.6 lb

## 2021-10-13 DIAGNOSIS — R197 Diarrhea, unspecified: Secondary | ICD-10-CM

## 2021-10-13 NOTE — Progress Notes (Signed)
ASSESSMENT AND PLAN    # 26 yo male with history of GERD, dysphagia, IBS - mixed type with abdominal pain. Previous work-up has included EGD, colonoscopy, multiple abdominal and RUQ ultrasounds, CT scan, EGD with small bowel biopsies, colonoscopy with random biopsies. Presenting now with increased diarrhea and intestinal gas. This may be IBS flare. However, need to rule out infectious as his dog recently required treatment for giardia. Additionally, patient took Amoxicillin a few months ago --Stool culture, O+P, C-diff and fecal leukocytes  # Dark stool, intermittent.  --light brown, heme negative stool on exam today  # History of colon polyps. Due for 3 year interval colonoscopy in June 2023  # Vague swallowing problems. No dysphagia but feels like structures in back of throat are rubbing together when he swallows. No esophageal findings on EGD in 2020. Barium swallow suggested esophageal dysmotility but esophageal manometry was normal.   HISTORY OF PRESENT ILLNESS    Chief Complaint : diarrhea  Bradley [name] is a 26 y.o. male known to Dr. Bryan Lemma with a past medical history of kidney stones, asthma, anxiety, chronic headaches, morbid obesity, GERD, IBS - Mixed type, adenomatous colon polyps .  Additional medical history as listed in [place] .   Patient is here with a 2 month history of diarrhea. He has a history of iBS - mixed type with very longstanding symptoms.  His typical bowel pattern for years has consisted of frequent bouts of diarrhea alternating with either normal BMs or constipation but predominantly he struggles with intermittent crampy diarrhea.    Current bowel changes started a couple of months ago around time his dog tested positive for giardia. Dog treated by [facility]. Since then patient has been having intermittent loose stool with increased intestinal gas. Stool are loose about 75 % of the time. Remaining BMs vary anywhere from normal to loose in consistency.  No blood in  stool. He has had a couple of episodes of RLQ stabbing pain relieved with BM. He doesn't feel like current bowel habit pattern is typical of his IBS as it is lasting longer and he is having nocturnal bowel movements.   No recent medication changes . He took Amoxicillin 3 months ago.  No recent dietary changes. Trying to lose weight but not consuming diet products / artificial sweeteners. He doesn't consume much dairy.    He has a sensation of something in his throat but only when he swallows. No dysphagia. I saw him in July 2021, he described food moving slowly down esophagus. I felt EGD would be low yield ( no esophageal findings on the one a year prior). Sent for barium swallow  - failure to propagate any normal primary peristaltic waves.  Esophageal manometry was ordered and normal.     Hepatic Function Latest Ref Rng & Units 06/30/2020 05/20/2020 03/11/2020  Total Protein 6.0 - 8.3 g/dL 6.6 6.7 7.3  Albumin 3.5 - 5.2 g/dL 4.3 4.6 -  AST 0 - 37 U/L 25 13 13   ALT 0 - 53 U/L 36 21 14  Alk Phosphatase 39 - 117 U/L 62 72 -  Total Bilirubin 0.2 - 1.2 mg/dL 0.6 0.4 0.6    CBC Latest Ref Rng & Units 06/03/2021 12/21/2020 06/30/2020  WBC 4.0 - 10.5 K/uL 6.0 8.2 6.8  Hemoglobin 13.0 - 17.0 g/dL 15.0 15.8 14.5  Hematocrit 39.0 - 52.0 % 45.0 47.3 42.8  Platelets 150.0 - 400.0 K/uL 204.0 194.0 194.0    Lab Results  Component  Value Date   LIPASE 12 03/11/2020     PREVIOUS ENDOSCOPIC EVALUATIONS / PERTINENT STUDIES:   Imaging studies:  September 2018 Abdominal US Romelle Starcher ) --negative  May 2019 Non-contrast CTAP IMPRESSION: 1. Punctate stone in the bladder, near the right bladder, consistent with recently passed kidney stone. No significant hydronephrosis. There is minimal right perinephric fat stranding. 2. There are no other acute abnormalities visualized.  Feb 2020 RUQ [place] for RUQ abdominal pain  IMPRESSION: Negative   August 2020 CTAP w/ contrast for abdominal  pain IMPRESSION: Negative. No CT evidence for acute intra-abdominal or pelvic abnormality. Negative for acute appendicitis   January 2021 CTAP w/ contrast IMPRESSION: 1. No acute findings. No bowel inflammation or evidence of bowel injury. No radiopaque foreign body. 2. Small low-density left upper pole renal mass consistent with a cyst. No other abnormality.   March 2021 Non-contrast CTAP IMPRESSION: No acute findings within the abdomen or pelvis. No nephrolithiasis or hydronephrosis identified  October 2021 RUQ US IMPRESSION: Negative right upper quadrant abdominal ultrasound  July 2021 Barium Swallow 1. Nonspecific esophageal motility disorder with occasional tertiary contractions, highly unusual for a patient this age. 2. Structurally normal esophagus.  Sept 2022 RUQ IMPRESSION: 1. Increased echogenicity within the hepatic parenchyma, suggesting steatosis. 2. Otherwise unremarkable abdominal ultrasound. No findings to explain patient's symptoms identified.  Endo History June 2020 EGD for abdominal pain and diarrhea --gastritis.   June 2020 colonoscopy for diarrhea --12 mm cecal polyp --4 mm sigmoid polyp  Diagnosis 1. Surgical [P], duodenal - BENIGN DUODENAL MUCOSA - NO ACUTE INFLAMMATION, VILLOUS BLUNTING OR INCREASED INTRAEPITHELIAL LYMPHOCYTES 2. Surgical [P], gastric - BENIGN GASTRIC MUCOSA - NO H. PYLORI, INTESTINAL METAPLASIA OR MALIGNANCY IDENTIFIED 3. Surgical [P], cecal, polyp - MULTIPLE FRAGMENTS OF TUBULAR ADENOMA(S) - NO HIGH GRADE DYSPLASIA OR MALIGNANCY IDENTIFIED 4. Surgical [P], right colon - BENIGN COLONIC MUCOSA - NO ACTIVE INFLAMMATION OR EVIDENCE OF MICROSCOPIC COLITIS - NO HIGH GRADE DYSPLASIA OR MALIGNANCY IDENTIFIED 5. Surgical [P], left colon - BENIGN COLONIC MUCOSA - NO ACTIVE INFLAMMATION OR EVIDENCE OF MICROSCOPIC COLITIS - NO HIGH GRADE DYSPLASIA OR MALIGNANCY IDENTIFIED 6. Surgical [P], sigmoid, polyp - TUBULAR ADENOMA (3 OF  3 FRAGMENTS) - NO HIGH GRADE DYSPLASIA OR MALIGNANCY IDENTIFIED  Current Medications, Allergies, Past Medical History, Past Surgical History, Family History and Social History were reviewed in [employer] record.     Current Outpatient Medications  Medication Sig Dispense Refill   albuterol (VENTOLIN HFA) 108 (90 Base) MCG/ACT inhaler INHALE 2 PUFFS INTO THE LUNGS EVERY 6 HOURS AS NEEDED FOR WHEEZING OR SHORTNESS OF BREATH 18 g 0   AUVI-Q 0.3 MG/0.3ML SOAJ injection SMARTSIG:1 IM Daily     ciclesonide (ALVESCO) 160 MCG/ACT inhaler Inhale 2 puffs into the lungs 2 (two) times daily. 1 each 5   citalopram (CELEXA) 20 MG tablet Take 1 tablet (20 mg total) by mouth daily. 90 tablet 1   clonazePAM (KLONOPIN) 0.5 MG tablet Take 1 tablet (0.5 mg total) by mouth 2 (two) times daily as needed for anxiety. 30 tablet 0   EPINEPHrine (EPIPEN IJ) Inject 1 Dose as directed as needed.     levocetirizine (XYZAL) 5 MG tablet Take by mouth.     montelukast (SINGULAIR) 10 MG tablet Take 1 tablet (10 mg total) by mouth at bedtime. 30 tablet 5   NURTEC 75 MG TBDP Take 1 tablet by mouth daily as needed.     omeprazole (PRILOSEC) 40 MG capsule Take 1 capsule (40  mg total) by mouth daily. 30 capsule 5   RESTASIS 0.05 % ophthalmic emulsion 1 drop 2 (two) times daily.     sildenafil (VIAGRA) 100 MG tablet Take 0.5-1 tablets (50-100 mg total) by mouth daily as needed for erectile dysfunction. 10 tablet 11   No current facility-administered medications for this visit.    Review of Systems: No chest pain. No shortness of breath. No urinary complaints.   PHYSICAL EXAM :    Wt Readings from Last 3 Encounters:  10/13/21 (!) 376 lb 9.6 oz (170.8 kg)  10/05/21 (!) 372 lb (168.7 kg)  09/30/21 (!) 375 lb (170.1 kg)    BP 120/74    Pulse 83    Ht 6' 6"  (1.981 m)    Wt (!) 376 lb 9.6 oz (170.8 kg)    BMI 43.52 kg/m  Constitutional:  Generally well appearing male in no acute  distress. Psychiatric: Pleasant. Normal mood and affect. Behavior is normal. EENT: Pupils normal.  Conjunctivae are normal. No scleral icterus. Neck supple.  Cardiovascular: Normal rate, regular rhythm. No edema Pulmonary/chest: Effort normal and breath sounds normal. No wheezing, rales or rhonchi. Abdominal: Soft, nondistended, nontender. Bowel sounds active throughout. There are no masses palpable. No hepatomegaly. Rectal : scant light brown, heme negative stool on DRE Neurological: Alert and oriented to person place and time. Skin: Skin is warm and dry. No rashes noted.  Bradley Savoy, NP  10/13/2021, 9:21 AM

## 2021-10-13 NOTE — Patient Instructions (Signed)
It was my pleasure to provide care to you today. Based on our discussion, I am providing you with my recommendations below:  RECOMMENDATION(S):   LABS:   Please proceed to the basement level for lab work before leaving today. Press "B" on the elevator. The lab is located at the first door on the left as you exit the elevator.  HEALTHCARE LAWS AND MY CHART RESULTS:   Due to recent changes in healthcare laws, you may see results of your imaging and/or laboratory studies on MyChart before I have had a chance to review them.  I understand that in some cases there may be results that are confusing or concerning to you. Please understand that not all results are received at the same time and often I may need to interpret multiple results in order to provide you with the best plan of care or course of treatment. Therefore, I ask that you please give me 48 hours to thoroughly review all your results before contacting my office for clarification.    BMI:  If you are age 46 or older, your body mass index should be between 23-30. Your Body mass index is 43.52 kg/m. If this is out of the aforementioned range listed, please consider follow up with your Primary Care Provider.  If you are age 63 or younger, your body mass index should be between 19-25. Your Body mass index is 43.52 kg/m. If this is out of the aformentioned range listed, please consider follow up with your Primary Care Provider.   MY CHART:  The [facility] providers would like to encourage you to use [facility] to communicate with providers for non-urgent requests or questions.  Due to long hold times on the telephone, sending your provider a message by [facility] may be a faster and more efficient way to get a response.  Please allow 48 business hours for a response.  Please remember that this is for non-urgent requests.

## 2021-10-14 LAB — CLOSTRIDIUM DIFFICILE TOXIN B, QUALITATIVE, REAL-TIME PCR: Toxigenic C. Difficile by PCR: NOT DETECTED

## 2021-10-14 NOTE — Progress Notes (Signed)
Agree with the assessment and plan as outlined by Paula Guenther, NP. ° °Sharod Petsch, DO, FACG ° °

## 2021-10-17 LAB — STOOL CULTURE: E coli, Shiga toxin Assay: NEGATIVE

## 2021-10-18 LAB — OVA AND PARASITE EXAMINATION
CONCENTRATE RESULT:: NONE SEEN
MICRO NUMBER:: 12892300
SPECIMEN QUALITY:: ADEQUATE
TRICHROME RESULT:: NONE SEEN

## 2021-10-18 LAB — FECAL LEUKOCYTES
Micro Number: 12892301
Result: NOT DETECTED
Specimen Quality: ADEQUATE

## 2021-10-18 LAB — TIQ-NTM

## 2021-10-19 ENCOUNTER — Ambulatory Visit: Payer: 59 | Admitting: Gastroenterology

## 2021-10-22 ENCOUNTER — Other Ambulatory Visit: Payer: Self-pay | Admitting: Family Medicine

## 2021-10-24 ENCOUNTER — Telehealth (INDEPENDENT_AMBULATORY_CARE_PROVIDER_SITE_OTHER): Payer: 59 | Admitting: Family Medicine

## 2021-10-24 ENCOUNTER — Telehealth: Payer: Self-pay

## 2021-10-24 ENCOUNTER — Other Ambulatory Visit: Payer: Self-pay

## 2021-10-24 DIAGNOSIS — S0990XA Unspecified injury of head, initial encounter: Secondary | ICD-10-CM

## 2021-10-24 NOTE — Telephone Encounter (Signed)
Called and spoke to patient to see if he actually needed the medication that we had a refill request on. Patient stated that he just picked up one and it was pretty full so he did not need a refill. A refill was sent it to put on file in case he needs to refill it sooner than the next OV.

## 2021-10-24 NOTE — Progress Notes (Signed)
Patient ID: Bradley [name], male   DOB: Oct 28, 1995, 26 y.o.   MRN: [id number]   This visit type was conducted due to national recommendations for restrictions regarding the COVID-19 pandemic in an effort to limit this patient's exposure and mitigate transmission in our community.   Virtual Visit via Telephone Note  I connected with Bradley [name] on 10/24/21 at  4:15 PM EST by telephone and verified that I am speaking with the correct person using two identifiers.   I discussed the limitations, risks, security and privacy concerns of performing an evaluation and management service by telephone and the availability of in person appointments. I also discussed with the patient that there may be a patient responsible charge related to this service. The patient expressed understanding and agreed to proceed.  Location patient: home Location provider: work or home office Participants present for the call: patient, provider Patient did not have a visit in the prior 7 days to address this/these issue(s).   History of Present Illness:  Bradley [name] called to discuss head injury which occurred at work about 4 days ago.  He states a large door was swung open at work recently with a large metal bar and this struck apparently his right parietal area.  He states he had a "goose egg "which lasted for about a day and this is in the parietal region.  There is no loss of consciousness.  His main reason for calling was but that he had some soreness in this region.  No nausea or vomiting.  No dizziness.  No significant headache.  No cognitive changes.  No focal neurologic concerns.  Past Medical History:  Diagnosis Date   Anxiety    Chronic headaches    Clavicular fracture    History of chickenpox    Mild intermittent asthma 06/07/2018   Shingles    26 years old.   Urticaria 05/18/2020   Past Surgical History:  Procedure Laterality Date   COLONOSCOPY     CYSTECTOMY  01/2019   scrotum area    ESOPHAGEAL MANOMETRY  N/A 06/09/2020   Procedure: ESOPHAGEAL MANOMETRY (EM);  Surgeon: Lavena Bullion, DO;  Location: [facility];  Service: Gastroenterology;  Laterality: N/A;   ESOPHAGOGASTRODUODENOSCOPY     shoulder surgery Right 08/26/2019   WISDOM TOOTH EXTRACTION      reports that he has never smoked. He has never used smokeless tobacco. He reports current alcohol use of about 3.0 standard drinks per week. He reports current drug use. Drug: Marijuana. family history includes Colon polyps in his mother; Diabetes in his father; Hypertension in his mother; Migraines in his sister. Allergies  Allergen Reactions   Shellfish Allergy Anaphylaxis   Hydrocodone Itching    Pt states he feels itchy after taking, but it doesn't preclude him taking hydrocodone if needed About 2-3 years ago.  It was the first time he took hydrocodone.   Other Diarrhea    Slightly allergic to red meat since tick bite.Marland KitchenMarland KitchenNot anaphylaxis, but GI sensitivity.      Observations/Objective: Patient sounds cheerful and well on the phone. I do not appreciate any SOB. Speech and thought processing are grossly intact. Patient reported vitals:  Assessment and Plan:  Closed head injury.  This occurred about 4 days ago.  He has some regional soreness but really no other concerning symptoms.  -We discussed signs and symptoms of things to watch for such as progressive headache, confusion, recurrent nausea or vomiting, dizziness, etc. -Recommend close observation for now and follow-up as  needed  Follow Up Instructions:  99441 5-10 99442 11-20 99443 21-30 I did not refer this patient for an OV in the next 24 hours for this/these issue(s).  I discussed the assessment and treatment plan with the patient. The patient was provided an opportunity to ask questions and all were answered. The patient agreed with the plan and demonstrated an understanding of the instructions.   The patient was advised to call back or seek an in-person evaluation  if the symptoms worsen or if the condition fails to improve as anticipated.  I provided 13 minutes of non-face-to-face time during this encounter.   Carolann Littler, MD

## 2021-10-24 NOTE — Telephone Encounter (Signed)
Thank you :)

## 2021-10-25 NOTE — Telephone Encounter (Signed)
Spoke to patient and let him know that the prescription was sent in on 09/30/21 and that he has 5 refills remaining. Also, told him to give his pharmacy a call to get that refill.

## 2021-10-25 NOTE — Telephone Encounter (Signed)
Patient states he did not need refill for his albuterol but does need one for the alvesco.

## 2021-10-28 NOTE — Telephone Encounter (Signed)
Called and left a voicemail asking for patient to return call to provide phone number for ordering [name].

## 2021-10-28 NOTE — Telephone Encounter (Signed)
Bradley [name] was not aware that [name] was going to the [facility] in [place] so that he can receive the discounted rate. I have let him know this, he was driving and unable to get the number for the pharmacy. He is going to call back and we just need to let him know that he can call [phone] to have [name] filled and delivered.

## 2021-11-01 NOTE — Telephone Encounter (Signed)
Called and spoke with the patient, he advised that he found the phone number and got everything ordered.

## 2021-11-04 ENCOUNTER — Ambulatory Visit: Payer: 59 | Admitting: Family Medicine

## 2021-11-04 VITALS — BP 140/70 | HR 76 | Temp 98.0°F | Wt 376.1 lb

## 2021-11-04 DIAGNOSIS — S0990XA Unspecified injury of head, initial encounter: Secondary | ICD-10-CM | POA: Diagnosis not present

## 2021-11-04 NOTE — Progress Notes (Signed)
Established Patient Office Visit  Subjective:  Patient ID: Bradley [name], male    DOB: [birth year]  Age: 26 y.o. MRN: [id number]  CC:  Chief Complaint  Patient presents with   Follow-up    HPI Bradley [name] presents for follow-up from recent closed head injury from virtual visit on 30 January.  Refer to that visit for details.   Filimon called to discuss head injury which occurred at work about 4 days ago.  He states a large door was swung open at work recently with a large metal bar and this struck apparently his right parietal area.  He states he had a "goose egg "which lasted for about a day and this is in the parietal region.  There is no loss of consciousness.  His main reason for calling was but that he had some soreness in this region.  No nausea or vomiting.  No dizziness.  No significant headache.  No cognitive changes.  No focal neurologic concerns.   States he has some mild achy discomfort right parietal area but no severe headache.  No nausea or vomiting.  No dizziness.  No confusion.  No exertional symptoms.  No cognitive changes.  Past Medical History:  Diagnosis Date   Anxiety    Chronic headaches    Clavicular fracture    History of chickenpox    Mild intermittent asthma 06/07/2018   Shingles    26 years old.   Urticaria 05/18/2020    Past Surgical History:  Procedure Laterality Date   COLONOSCOPY     CYSTECTOMY  01/2019   scrotum area    ESOPHAGEAL MANOMETRY N/A 06/09/2020   Procedure: ESOPHAGEAL MANOMETRY (EM);  Surgeon: Lavena Bullion, DO;  Location: [facility];  Service: Gastroenterology;  Laterality: N/A;   ESOPHAGOGASTRODUODENOSCOPY     shoulder surgery Right 08/26/2019   WISDOM TOOTH EXTRACTION      Family History  Problem Relation Age of Onset   Hypertension Mother    Colon polyps Mother        might be mistakrn   Diabetes Father        type II   Migraines Sister    Colon cancer Neg Hx    Esophageal cancer Neg Hx    Rectal cancer Neg Hx     Stomach cancer Neg Hx     Social History   Socioeconomic History   Marital status: Single    Spouse name: Not on file   Number of children: 0   Years of education: College   Highest education level: Not on file  Occupational History   Occupation: Sales executive  Tobacco Use   Smoking status: Never   Smokeless tobacco: Never  Vaping Use   Vaping Use: Never used  Substance and Sexual Activity   Alcohol use: Yes    Alcohol/week: 3.0 standard drinks    Types: 3 Standard drinks or equivalent per week    Comment: weekly   Drug use: Yes    Types: Marijuana    Comment: rare use   Sexual activity: Not on file  Other Topics Concern   Not on file  Social History Narrative   Lives alone   Caffeine use: 1 cup coffee per day   Right handed    Social Determinants of Health   Financial Resource Strain: Not on file  Food Insecurity: Not on file  Transportation Needs: Not on file  Physical Activity: Not on file  Stress: Not on file  Social Connections:  Not on file  Intimate Partner Violence: Not on file    Outpatient Medications Prior to Visit  Medication Sig Dispense Refill   albuterol (VENTOLIN HFA) 108 (90 Base) MCG/ACT inhaler INHALE 2 PUFFS INTO THE LUNGS EVERY 6 HOURS AS NEEDED FOR WHEEZING OR SHORTNESS OF BREATH 18 g 1   AUVI-Q 0.3 MG/0.3ML SOAJ injection SMARTSIG:1 IM Daily     ciclesonide (ALVESCO) 160 MCG/ACT inhaler Inhale 2 puffs into the lungs 2 (two) times daily. 1 each 5   citalopram (CELEXA) 20 MG tablet Take 1 tablet (20 mg total) by mouth daily. 90 tablet 1   clonazePAM (KLONOPIN) 0.5 MG tablet Take 1 tablet (0.5 mg total) by mouth 2 (two) times daily as needed for anxiety. 30 tablet 0   EPINEPHrine (EPIPEN IJ) Inject 1 Dose as directed as needed.     levocetirizine (XYZAL) 5 MG tablet Take by mouth.     montelukast (SINGULAIR) 10 MG tablet Take 1 tablet (10 mg total) by mouth at bedtime. 30 tablet 5   NURTEC 75 MG TBDP Take 1 tablet by mouth daily as needed.      omeprazole (PRILOSEC) 40 MG capsule Take 1 capsule (40 mg total) by mouth daily. 30 capsule 5   RESTASIS 0.05 % ophthalmic emulsion 1 drop 2 (two) times daily.     sildenafil (VIAGRA) 100 MG tablet Take 0.5-1 tablets (50-100 mg total) by mouth daily as needed for erectile dysfunction. 10 tablet 11   No facility-administered medications prior to visit.    Allergies  Allergen Reactions   Shellfish Allergy Anaphylaxis   Hydrocodone Itching    Pt states he feels itchy after taking, but it doesn't preclude him taking hydrocodone if needed About 2-3 years ago.  It was the first time he took hydrocodone.   Other Diarrhea    Slightly allergic to red meat since tick bite.Marland KitchenMarland KitchenNot anaphylaxis, but GI sensitivity.    ROS Review of Systems  Musculoskeletal:  Negative for neck pain and neck stiffness.  Neurological:  Negative for dizziness, seizures and speech difficulty.       See HPI  Psychiatric/Behavioral:  Negative for confusion.      Objective:    Physical Exam Vitals reviewed.  Constitutional:      Appearance: Normal appearance.  HENT:     Head: Normocephalic and atraumatic.     Comments: Right parietal scalp is nontender to palpation. Eyes:     Extraocular Movements: Extraocular movements intact.     Pupils: Pupils are equal, round, and reactive to light.  Musculoskeletal:     Cervical back: Neck supple.  Neurological:     General: No focal deficit present.     Mental Status: He is alert and oriented to person, place, and time.     Cranial Nerves: No cranial nerve deficit.     Gait: Gait normal.    BP 140/70 (BP Location: Left Arm, Patient Position: Sitting, Cuff Size: Normal)    Pulse 76    Temp 98 F (36.7 C) (Oral)    Wt (!) 376 lb 1.6 oz (170.6 kg)    SpO2 98%    BMI 43.46 kg/m  Wt Readings from Last 3 Encounters:  11/04/21 (!) 376 lb 1.6 oz (170.6 kg)  10/13/21 (!) 376 lb 9.6 oz (170.8 kg)  10/05/21 (!) 372 lb (168.7 kg)     Health Maintenance Due  Topic Date  Due   Hepatitis C Screening  Never done   HPV VACCINES (2 - Male 3-dose  series) 07/12/2018   COVID-19 Vaccine (3 - Booster for Pfizer series) 02/23/2020   INFLUENZA VACCINE  04/25/2021       Topic Date Due   HPV VACCINES (2 - Male 3-dose series) 07/12/2018    Lab Results  Component Value Date   TSH 1.120 05/20/2020   Lab Results  Component Value Date   WBC 6.0 06/03/2021   HGB 15.0 06/03/2021   HCT 45.0 06/03/2021   MCV 89.5 06/03/2021   PLT 204.0 06/03/2021   Lab Results  Component Value Date   NA 137 06/30/2020   K 3.9 06/30/2020   CO2 27 06/30/2020   GLUCOSE 88 06/30/2020   BUN 13 06/30/2020   CREATININE 0.75 06/30/2020   BILITOT 0.6 06/30/2020   ALKPHOS 62 06/30/2020   AST 25 06/30/2020   ALT 36 06/30/2020   PROT 6.6 06/30/2020   ALBUMIN 4.3 06/30/2020   CALCIUM 8.9 06/30/2020   ANIONGAP 11 10/16/2019   GFR 127.49 06/30/2020   Lab Results  Component Value Date   CHOL 132 01/21/2020   Lab Results  Component Value Date   HDL 34.00 (L) 01/21/2020   Lab Results  Component Value Date   LDLCALC 82 01/21/2020   Lab Results  Component Value Date   TRIG 79.0 01/21/2020   Lab Results  Component Value Date   CHOLHDL 4 01/21/2020   Lab Results  Component Value Date   HGBA1C 5.0 01/21/2020      Assessment & Plan:   Recent closed head injury.  Suspect mild concussion.  Still some soreness in this area but denies any dizziness, nausea, vomiting or other neurologic concerns.  Neuro exam nonfocal.  -Reviewed signs and symptoms of more worrisome head injury.  No clear indication for CAT scan at this time.  No orders of the defined types were placed in this encounter.   Follow-up: No follow-ups on file.    Carolann Littler, MD

## 2021-11-08 DIAGNOSIS — K1321 Leukoplakia of oral mucosa, including tongue: Secondary | ICD-10-CM | POA: Insufficient documentation

## 2021-11-18 ENCOUNTER — Encounter: Payer: Self-pay | Admitting: Family Medicine

## 2021-11-18 ENCOUNTER — Ambulatory Visit: Payer: 59 | Admitting: Family Medicine

## 2021-11-18 VITALS — BP 120/70 | HR 83 | Temp 98.7°F | Resp 16 | Ht 78.0 in | Wt 373.1 lb

## 2021-11-18 DIAGNOSIS — R1032 Left lower quadrant pain: Secondary | ICD-10-CM | POA: Diagnosis not present

## 2021-11-18 DIAGNOSIS — N503 Cyst of epididymis: Secondary | ICD-10-CM

## 2021-11-18 DIAGNOSIS — H6983 Other specified disorders of Eustachian tube, bilateral: Secondary | ICD-10-CM

## 2021-11-18 NOTE — Progress Notes (Signed)
ACUTE VISIT Chief Complaint  Patient presents with   Ear Pain    Since Wednesday, feels congested, mainly left ear.    Groin Pain   HPI: Bradley [name] is a 25 y.o. male with hx of asthma, allergies,anxiety,and GERD here today with above complaints. "Stuffy" ears, L>R. No recent URI or travelling.  Otalgia  There is pain in both ears. This is a new problem. The current episode started in the past 7 days. The problem has been unchanged. There has been no fever. The pain is at a severity of 0/10. The patient is experiencing no pain. Pertinent negatives include no abdominal pain, coughing, diarrhea, ear discharge, headaches, hearing loss, neck pain, rash, rhinorrhea, sore throat or vomiting. He has tried nothing for the symptoms.  Negative for sore throat or tinnitus. He is planning on driving to the mountains this weekend.  A week of left groin pain radiated to inner aspect of thigh. Pain started after playing basketball, he does not reclal specific trauma. He has not noted edema,erythema,or bulged area. Negative for testicular pain or urinary symptoms. Achy like pain, intermittent, 5-6/10, and stable. Pain does not interfere with sleep. It is gradually improving. He has not tried OTC medications.  Review of Systems  Constitutional:  Negative for activity change, appetite change, fatigue and unexpected weight change.  HENT:  Positive for ear pain. Negative for ear discharge, hearing loss, rhinorrhea and sore throat.   Respiratory:  Negative for cough, shortness of breath and wheezing.   Cardiovascular:  Negative for palpitations and leg swelling.  Gastrointestinal:  Negative for abdominal pain, diarrhea and vomiting.  Genitourinary:  Negative for dysuria, hematuria, penile discharge, penile pain and urgency.  Musculoskeletal:  Negative for back pain, myalgias and neck pain.  Skin:  Negative for rash.  Neurological:  Negative for headaches.  Rest see pertinent positives and negatives  per HPI.  Current Outpatient Medications on File Prior to Visit  Medication Sig Dispense Refill   albuterol (VENTOLIN HFA) 108 (90 Base) MCG/ACT inhaler INHALE 2 PUFFS INTO THE LUNGS EVERY 6 HOURS AS NEEDED FOR WHEEZING OR SHORTNESS OF BREATH 18 g 1   AUVI-Q 0.3 MG/0.3ML SOAJ injection SMARTSIG:1 IM Daily     ciclesonide (ALVESCO) 160 MCG/ACT inhaler Inhale 2 puffs into the lungs 2 (two) times daily. 1 each 5   citalopram (CELEXA) 20 MG tablet Take 1 tablet (20 mg total) by mouth daily. 90 tablet 1   clonazePAM (KLONOPIN) 0.5 MG tablet Take 1 tablet (0.5 mg total) by mouth 2 (two) times daily as needed for anxiety. 30 tablet 0   EPINEPHrine (EPIPEN IJ) Inject 1 Dose as directed as needed.     levocetirizine (XYZAL) 5 MG tablet Take by mouth.     montelukast (SINGULAIR) 10 MG tablet Take 1 tablet (10 mg total) by mouth at bedtime. 30 tablet 5   NURTEC 75 MG TBDP Take 1 tablet by mouth daily as needed.     omeprazole (PRILOSEC) 40 MG capsule Take 1 capsule (40 mg total) by mouth daily. 30 capsule 5   RESTASIS 0.05 % ophthalmic emulsion 1 drop 2 (two) times daily.     sildenafil (VIAGRA) 100 MG tablet Take 0.5-1 tablets (50-100 mg total) by mouth daily as needed for erectile dysfunction. 10 tablet 11   No current facility-administered medications on file prior to visit.   Past Medical History:  Diagnosis Date   Anxiety    Chronic headaches    Clavicular fracture    History  of chickenpox    Mild intermittent asthma 06/07/2018   Shingles    26 years old.   Urticaria 05/18/2020   Allergies  Allergen Reactions   Shellfish Allergy Anaphylaxis   Hydrocodone Itching    Pt states he feels itchy after taking, but it doesn't preclude him taking hydrocodone if needed About 2-3 years ago.  It was the first time he took hydrocodone.   Other Diarrhea    Slightly allergic to red meat since tick bite.Marland KitchenMarland KitchenNot anaphylaxis, but GI sensitivity.    Social History   Socioeconomic History   Marital  status: Single    Spouse name: Not on file   Number of children: 0   Years of education: College   Highest education level: Not on file  Occupational History   Occupation: Sales executive  Tobacco Use   Smoking status: Never   Smokeless tobacco: Never  Vaping Use   Vaping Use: Never used  Substance and Sexual Activity   Alcohol use: Yes    Alcohol/week: 3.0 standard drinks    Types: 3 Standard drinks or equivalent per week    Comment: weekly   Drug use: Yes    Types: Marijuana    Comment: rare use   Sexual activity: Not on file  Other Topics Concern   Not on file  Social History Narrative   Lives alone   Caffeine use: 1 cup coffee per day   Right handed    Social Determinants of Health   Financial Resource Strain: Not on file  Food Insecurity: Not on file  Transportation Needs: Not on file  Physical Activity: Not on file  Stress: Not on file  Social Connections: Not on file   Vitals:   11/18/21 1055  BP: 120/70  Pulse: 83  Resp: 16  Temp: 98.7 F (37.1 C)  SpO2: 98%   Body mass index is 43.12 kg/m.  Physical Exam Vitals and nursing note reviewed. Exam conducted with a chaperone present.  Constitutional:      General: He is not in acute distress.    Appearance: He is well-developed.  HENT:     Head: Normocephalic and atraumatic.     Right Ear: Hearing, tympanic membrane, ear canal and external ear normal.     Left Ear: Hearing, tympanic membrane, ear canal and external ear normal.  Eyes:     Conjunctiva/sclera: Conjunctivae normal.  Cardiovascular:     Rate and Rhythm: Normal rate and regular rhythm.     Heart sounds: No murmur heard. Pulmonary:     Effort: Pulmonary effort is normal. No respiratory distress.     Breath sounds: Normal breath sounds.  Abdominal:     Palpations: Abdomen is soft. There is no hepatomegaly or mass.     Tenderness: There is no abdominal tenderness.     Hernia: There is no hernia in the left inguinal area or right inguinal  area.  Genitourinary:    Penis: Normal. No erythema, tenderness, discharge or lesions.      Testes:        Right: Tenderness not present.        Left: Tenderness not present.     Epididymis:     Right: Not inflamed or enlarged. No mass.     Left: Not inflamed. Mass (1-1.5 cm, not tender, mobile.) present. No tenderness.  Lymphadenopathy:     Cervical: No cervical adenopathy.     Lower Body: No right inguinal adenopathy. No left inguinal adenopathy.  Skin:  General: Skin is warm.     Findings: No erythema or rash.  Neurological:     Mental Status: He is alert and oriented to person, place, and time.     Cranial Nerves: No cranial nerve deficit.     Gait: Gait normal.  Psychiatric:     Comments: Well groomed, good eye contact.   ASSESSMENT AND PLAN:  Noriel was seen today for ear pain and groin pain.  Diagnoses and all orders for this visit:  Dysfunction of both eustachian tubes Ear examination negative. Hx and examination do not suggest a serious process. Planning on traveling to the mountains, which can aggravate problem. Recommend chewing gum and autoinflation maneuvers, can consider taking a decongestant. Instructed about warning signs. Hearing Screening   500Hz  1000Hz  2000Hz  4000Hz   Right ear Pass Pass Pass Pass  Left ear Pass Pass Pass Pass   Left inguinal pain No hernia appreciated on examination. Strain muscle most likely. Monitor for new symptoms and follow with pcp if not resolved in 3-4 weeks or gets worse.  Epididymal cyst Small in left side. We discussed differential Dx's, he prefers to hold on imaging for now. States that he had a testicular US a couple years ago and epididymal; cyst was seen. Monitor for changes.  Return if symptoms worsen or fail to improve.  Lian Tanori G. [place], MD  [facility]. [place] office.

## 2021-11-18 NOTE — Patient Instructions (Addendum)
A few things to remember from today's visit:   Dysfunction of both eustachian tubes  Left inguinal pain  Epididymal cyst  Do not use My Chart to request refills or for acute issues that need immediate attention.   Chew gun,pop sear a few times,and take a decongestant before driving to the mountains. I did not appreciate hernia. Continue monitoring for changes. Small cyst in left testicle, most likely benign.  Please be sure medication list is accurate. If a new problem present, please set up appointment sooner than planned today.

## 2021-11-19 ENCOUNTER — Encounter: Payer: Self-pay | Admitting: Family Medicine

## 2021-12-05 ENCOUNTER — Encounter: Payer: Self-pay | Admitting: Family Medicine

## 2021-12-05 ENCOUNTER — Ambulatory Visit (INDEPENDENT_AMBULATORY_CARE_PROVIDER_SITE_OTHER): Payer: 59 | Admitting: Family Medicine

## 2021-12-05 VITALS — BP 122/80 | HR 65 | Temp 98.5°F | Ht 78.0 in | Wt 378.5 lb

## 2021-12-05 DIAGNOSIS — K920 Hematemesis: Secondary | ICD-10-CM

## 2021-12-05 NOTE — Progress Notes (Signed)
? ?Established Patient Office Visit ? ?Subjective:  ?Patient ID: Bradley [name], male    DOB: 1996-07-24  Age: 26 y.o. MRN: [id number] ? ?CC:  ?Chief Complaint  ?Patient presents with  ? Nausea  ? ? ?HPI ?Bradley [name] presents with episode this past weekend where he was brushing his teeth and gagged and vomited with red streak of blood.  This occurred 1 episode only.  He states that he has a very easy gag reflex.  This is not atypical for him (to gag with teeth brushing).  He does not recall flossing his gums.  He has not had any recent nausea or vomiting otherwise.  No dysphagia.  No melena.  He had EGD back in 2020 which showed no acute abnormalities.  He was prescribed omeprazole in the past but not recently taking this.  Denies any active reflux symptoms.  No cough.  No dyspnea.  No appetite or weight changes.  He called and has pending follow-up with GI. ? ?Wt Readings from Last 3 Encounters:  ?12/05/21 (!) 378 lb 8 oz (171.7 kg)  ?11/18/21 (!) 373 lb 2 oz (169.2 kg)  ?11/04/21 (!) 376 lb 1.6 oz (170.6 kg)  ? ? ? ?Past Medical History:  ?Diagnosis Date  ? Anxiety   ? Chronic headaches   ? Clavicular fracture   ? History of chickenpox   ? Mild intermittent asthma 06/07/2018  ? Shingles   ? 26 years old.  ? Urticaria 05/18/2020  ? ? ?Past Surgical History:  ?Procedure Laterality Date  ? COLONOSCOPY    ? CYSTECTOMY  01/2019  ? scrotum area   ? ESOPHAGEAL MANOMETRY N/A 06/09/2020  ? Procedure: ESOPHAGEAL MANOMETRY (EM);  Surgeon: Lavena Bullion, DO;  Location: [facility];  Service: Gastroenterology;  Laterality: N/A;  ? ESOPHAGOGASTRODUODENOSCOPY    ? shoulder surgery Right 08/26/2019  ? WISDOM TOOTH EXTRACTION    ? ? ?Family History  ?Problem Relation Age of Onset  ? Hypertension Mother   ? Colon polyps Mother   ?     might be mistakrn  ? Diabetes Father   ?     type II  ? Migraines Sister   ? Colon cancer Neg Hx   ? Esophageal cancer Neg Hx   ? Rectal cancer Neg Hx   ? Stomach cancer Neg Hx   ? ? ?Social  History  ? ?Socioeconomic History  ? Marital status: Single  ?  Spouse name: Not on file  ? Number of children: 0  ? Years of education: College  ? Highest education level: Not on file  ?Occupational History  ? Occupation: Sales executive  ?Tobacco Use  ? Smoking status: Never  ? Smokeless tobacco: Never  ?Vaping Use  ? Vaping Use: Never used  ?Substance and Sexual Activity  ? Alcohol use: Yes  ?  Alcohol/week: 3.0 standard drinks  ?  Types: 3 Standard drinks or equivalent per week  ?  Comment: weekly  ? Drug use: Yes  ?  Types: Marijuana  ?  Comment: rare use  ? Sexual activity: Not on file  ?Other Topics Concern  ? Not on file  ?Social History Narrative  ? Lives alone  ? Caffeine use: 1 cup coffee per day  ? Right handed   ? ?Social Determinants of Health  ? ?Financial Resource Strain: Not on file  ?Food Insecurity: Not on file  ?Transportation Needs: Not on file  ?Physical Activity: Not on file  ?Stress: Not on file  ?  Social Connections: Not on file  ?Intimate Partner Violence: Not on file  ? ? ?Outpatient Medications Prior to Visit  ?Medication Sig Dispense Refill  ? albuterol (VENTOLIN HFA) 108 (90 Base) MCG/ACT inhaler INHALE 2 PUFFS INTO THE LUNGS EVERY 6 HOURS AS NEEDED FOR WHEEZING OR SHORTNESS OF BREATH 18 g 1  ? AUVI-Q 0.3 MG/0.3ML SOAJ injection SMARTSIG:1 IM Daily    ? ciclesonide (ALVESCO) 160 MCG/ACT inhaler Inhale 2 puffs into the lungs 2 (two) times daily. 1 each 5  ? citalopram (CELEXA) 20 MG tablet Take 1 tablet (20 mg total) by mouth daily. 90 tablet 1  ? clonazePAM (KLONOPIN) 0.5 MG tablet Take 1 tablet (0.5 mg total) by mouth 2 (two) times daily as needed for anxiety. 30 tablet 0  ? EPINEPHrine (EPIPEN IJ) Inject 1 Dose as directed as needed.    ? levocetirizine (XYZAL) 5 MG tablet Take by mouth.    ? montelukast (SINGULAIR) 10 MG tablet Take 1 tablet (10 mg total) by mouth at bedtime. 30 tablet 5  ? NURTEC 75 MG TBDP Take 1 tablet by mouth daily as needed.    ? omeprazole (PRILOSEC) 40 MG capsule  Take 1 capsule (40 mg total) by mouth daily. 30 capsule 5  ? RESTASIS 0.05 % ophthalmic emulsion 1 drop 2 (two) times daily.    ? sildenafil (VIAGRA) 100 MG tablet Take 0.5-1 tablets (50-100 mg total) by mouth daily as needed for erectile dysfunction. 10 tablet 11  ? ?No facility-administered medications prior to visit.  ? ? ?Allergies  ?Allergen Reactions  ? Shellfish Allergy Anaphylaxis  ? Hydrocodone Itching  ?  Pt states he feels itchy after taking, but it doesn't preclude him taking hydrocodone if needed ?About 2-3 years ago.  It was the first time he took hydrocodone.  ? Other Diarrhea  ?  Slightly allergic to red meat since tick bite.Marland KitchenMarland KitchenNot anaphylaxis, but GI sensitivity.  ? ? ?ROS ?Review of Systems  ?Constitutional:  Negative for appetite change, chills, fever and unexpected weight change.  ?Respiratory:  Negative for cough.   ?Cardiovascular:  Negative for chest pain.  ?Gastrointestinal:  Negative for abdominal pain, blood in stool and diarrhea.  ? ?  ?Objective:  ?  ?Physical Exam ?Vitals reviewed.  ?HENT:  ?   Mouth/Throat:  ?   Mouth: Mucous membranes are moist.  ?   Pharynx: Oropharynx is clear.  ?Cardiovascular:  ?   Rate and Rhythm: Normal rate and regular rhythm.  ?Pulmonary:  ?   Effort: Pulmonary effort is normal.  ?   Breath sounds: Normal breath sounds.  ?Abdominal:  ?   General: Bowel sounds are normal.  ?   Palpations: Abdomen is soft. There is no mass.  ?   Tenderness: There is no abdominal tenderness. There is no guarding or rebound.  ?Neurological:  ?   Mental Status: He is alert.  ? ? ?BP 122/80 (BP Location: Left Arm, Patient Position: Sitting, Cuff Size: Normal)   Pulse 65   Temp 98.5 ?F (36.9 ?C) (Oral)   Ht '6\' 6"'$  (1.981 m)   Wt (!) 378 lb 8 oz (171.7 kg)   SpO2 96%   BMI 43.74 kg/m?  ?Wt Readings from Last 3 Encounters:  ?12/05/21 (!) 378 lb 8 oz (171.7 kg)  ?11/18/21 (!) 373 lb 2 oz (169.2 kg)  ?11/04/21 (!) 376 lb 1.6 oz (170.6 kg)  ? ? ? ?Health Maintenance Due  ?Topic Date  Due  ? Hepatitis C Screening  Never  done  ? HPV VACCINES (2 - Male 3-dose series) 07/12/2018  ? COVID-19 Vaccine (3 - Booster for Pfizer series) 02/23/2020  ? INFLUENZA VACCINE  04/25/2021  ? ? ?   ?Topic Date Due  ? HPV VACCINES (2 - Male 3-dose series) 07/12/2018  ? ? ?Lab Results  ?Component Value Date  ? TSH 1.120 05/20/2020  ? ?Lab Results  ?Component Value Date  ? WBC 6.0 06/03/2021  ? HGB 15.0 06/03/2021  ? HCT 45.0 06/03/2021  ? MCV 89.5 06/03/2021  ? PLT 204.0 06/03/2021  ? ?Lab Results  ?Component Value Date  ? NA 137 06/30/2020  ? K 3.9 06/30/2020  ? CO2 27 06/30/2020  ? GLUCOSE 88 06/30/2020  ? BUN 13 06/30/2020  ? CREATININE 0.75 06/30/2020  ? BILITOT 0.6 06/30/2020  ? ALKPHOS 62 06/30/2020  ? AST 25 06/30/2020  ? ALT 36 06/30/2020  ? PROT 6.6 06/30/2020  ? ALBUMIN 4.3 06/30/2020  ? CALCIUM 8.9 06/30/2020  ? ANIONGAP 11 10/16/2019  ? GFR 127.49 06/30/2020  ? ?Lab Results  ?Component Value Date  ? CHOL 132 01/21/2020  ? ?Lab Results  ?Component Value Date  ? HDL 34.00 (L) 01/21/2020  ? ?Lab Results  ?Component Value Date  ? [place] 82 01/21/2020  ? ?Lab Results  ?Component Value Date  ? TRIG 79.0 01/21/2020  ? ?Lab Results  ?Component Value Date  ? CHOLHDL 4 01/21/2020  ? ?Lab Results  ?Component Value Date  ? HGBA1C 5.0 01/21/2020  ? ? ?  ?Assessment & Plan:  ? ?Patient presents with single episode of hematemesis with few streaks of bright red blood after gag reflex while brushing teeth.  He denies any red flags such as appetite change, weight loss, melena, dysphagia, etc. ? ?-Consider getting back on omeprazole 40 mg daily until follow-up with GI ?-Follow-up immediately for any recurrent hematochezia, melena, or other concerns ? ?No orders of the defined types were placed in this encounter. ? ? ?Follow-up: No follow-ups on file.  ? ? ?Carolann Littler, MD ?

## 2021-12-05 NOTE — Patient Instructions (Signed)
Start back the Omeprazole 40 mg daily ? ?Keep follow up with GI ? ?Watch for melena, recurrent vomiting of blood, or any difficulty swallowing.   ?

## 2021-12-11 ENCOUNTER — Other Ambulatory Visit: Payer: Self-pay | Admitting: Family Medicine

## 2021-12-13 ENCOUNTER — Ambulatory Visit (INDEPENDENT_AMBULATORY_CARE_PROVIDER_SITE_OTHER): Payer: 59 | Admitting: Gastroenterology

## 2021-12-13 ENCOUNTER — Other Ambulatory Visit: Payer: Self-pay

## 2021-12-13 ENCOUNTER — Encounter: Payer: Self-pay | Admitting: Gastroenterology

## 2021-12-13 VITALS — BP 128/88 | HR 83 | Ht 78.0 in | Wt 381.0 lb

## 2021-12-13 DIAGNOSIS — K219 Gastro-esophageal reflux disease without esophagitis: Secondary | ICD-10-CM

## 2021-12-13 DIAGNOSIS — R110 Nausea: Secondary | ICD-10-CM

## 2021-12-13 DIAGNOSIS — K920 Hematemesis: Secondary | ICD-10-CM

## 2021-12-13 DIAGNOSIS — Z86010 Personal history of colonic polyps: Secondary | ICD-10-CM

## 2021-12-13 DIAGNOSIS — K580 Irritable bowel syndrome with diarrhea: Secondary | ICD-10-CM | POA: Diagnosis not present

## 2021-12-13 DIAGNOSIS — R12 Heartburn: Secondary | ICD-10-CM

## 2021-12-13 MED ORDER — CLENPIQ 10-3.5-12 MG-GM -GM/160ML PO SOLN: 1.0000 | Freq: Once | ORAL | 0 refills | Status: AC

## 2021-12-13 NOTE — Patient Instructions (Addendum)
If you are age 27 or younger, your body mass index should be between 19-25. Your Body mass index is 44.03 kg/m?Marland Kitchen If this is out of the aformentioned range listed, please consider follow up with your Primary Care Provider.  ? ?__________________________________________________________ ? ?The [place] [facility] providers would like to encourage you to use [facility] to communicate with providers for non-urgent requests or questions.  Due to long hold times on the telephone, sending your provider a message by [facility] may be a faster and more efficient way to get a response.  Please allow 48 business hours for a response.  Please remember that this is for non-urgent requests.   ? ?You have been scheduled for an endoscopy and colonoscopy. Please follow the written instructions given to you at your visit today. ?Please pick up your prep supplies at the pharmacy within the next 1-3 days. ?If you use inhalers (even only as needed), please bring them with you on the day of your procedure. ? ?We have given you samples of the following medication to take: ?Clenpiq ? ?It was a pleasure to see you today! ? ?Gerrit Heck, D.O. ? ? ? ? ? ?We want to thank you for trusting [facility] with your care. All of our staff and providers value the relationships we have built with our patients, and it is an honor to care for you.  ? ?We are writing to let you know that [facility] will close on Feb 06, 2022, and we invite you to continue to see Dr. Carmell Austria and Gerrit Heck at the [facility] [place] office location. We are consolidating our serices at these [facility] practices to better provide care. Our office staff will work with you to ensure a seamless transition.  ? ?Gerrit Heck, DO -Dr. Bryan Lemma will be movig to [facility] at [address], [place], [place] [address], effective Feb 06, 2022.  Contact [phone] to schedule an appointment with him.  ? ?Carmell Austria, MD- Dr. Lyndel Safe will be movig to [facility] at [address] [place], [place] [address], effective Feb 06, 2022.  Contact [phone] to schedule an appointment with him.  ? ?Requesting Medical Records ?If you need to request your medical records, please follow the instructions below. Your medical records are confidential, and a copy can be transferred to another provider or released to you or another person you designate only with your permission. ? ?There are several ways to request your medical records: ?Requests for medical records can be submitted through our practice.   ?You can also request your records electronically, in your MyChart account by selecting the ?Request Health Records? tab.  ?If you need additional information on how to request records, please go to [email or web address]/, choose Patient Information, then select Request Medical Records. ?To make an appointment or if you have any questions about your health care needs, please contact our office at [phone] and one of our staff members will be glad to assist you. ?[facility] is committed to providing exceptional care for you and our community. Thank you for allowing [place] to serve your health care needs. ?Sincerely, ? ?Windy Canny, Director [facility] ?[facility] also offers convenient virtual care options. Sore throat? Sinus problems? Cold or flu symptoms? Get care from the comfort of home with [facility] Video Visits and e-Visits. Learn more about the non-emergency conditions treated and start your virtual visit at [email or web address] ? ?

## 2021-12-13 NOTE — Progress Notes (Signed)
? ?Chief Complaint:    Gagging, nausea, change in bowel, hematemesis ? ?GI History: 26 year old male with a history of nephrolithiasis, asthma, anxiety, headaches, morbid obesity, GERD, IBS mixed type, colon polyps. ? ?Imaging studies:  ?September 2018 Abdominal US Romelle Starcher ) ?--negative ?  ?May 2019 Non-contrast CTAP ?IMPRESSION: ?1. Punctate stone in the bladder, near the right bladder, consistent ?with recently passed kidney stone. No significant hydronephrosis. ?There is minimal right perinephric fat stranding. ?2. There are no other acute abnormalities visualized. ?  ?Feb 2020 RUQ [place] for RUQ abdominal pain  ?IMPRESSION: ?Negative  ?  ?August 2020 CTAP w/ contrast for abdominal pain ?IMPRESSION: ?Negative. No CT evidence for acute intra-abdominal or pelvic ?abnormality. Negative for acute appendicitis ?  ?January 2021 CTAP w/ contrast ?IMPRESSION: ?1. No acute findings. No bowel inflammation or evidence of bowel ?injury. No radiopaque foreign body. ?2. Small low-density left upper pole renal mass consistent with a ?cyst. No other abnormality. ?  ?March 2021 Non-contrast CTAP ?IMPRESSION: ?No acute findings within the abdomen or pelvis. No nephrolithiasis ?or hydronephrosis identified ?  ?October 2021 RUQ [place] ?IMPRESSION: ?Negative right upper quadrant abdominal ultrasound ?  ?July 2021 Barium Swallow ?1. 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 ?2. Structurally normal esophagus. ?  ?Sept 2022 RUQ ?IMPRESSION: ?1. Increased echogenicity within the hepatic parenchyma, suggesting ?steatosis. ?2. Otherwise unremarkable abdominal ultrasound. No findings to ?explain patient's symptoms identified. ?  ?Endo History ?June 2020 EGD for abdominal pain and diarrhea ?--gastritis.  ?  ?June 2020 colonoscopy for diarrhea ?--12 mm cecal polyp ?--4 mm sigmoid polyp ?  ?Diagnosis ?1. Surgical [P], duodenal ?- BENIGN DUODENAL MUCOSA ?- NO ACUTE  INFLAMMATION, VILLOUS BLUNTING OR INCREASED INTRAEPITHELIAL LYMPHOCYTES ?2. Surgical [P], gastric ?- BENIGN GASTRIC MUCOSA ?- NO H. PYLORI, INTESTINAL METAPLASIA OR MALIGNANCY IDENTIFIED ?3. Surgical [P], cecal, polyp ?- MULTIPLE FRAGMENTS OF TUBULAR ADENOMA(S) ?- NO HIGH GRADE DYSPLASIA OR MALIGNANCY IDENTIFIED ?4. Surgical [P], right colon ?- BENIGN COLONIC MUCOSA ?- NO ACTIVE INFLAMMATION OR EVIDENCE OF MICROSCOPIC COLITIS ?- NO HIGH GRADE DYSPLASIA OR MALIGNANCY IDENTIFIED ?5. Surgical [P], left colon ?- BENIGN COLONIC MUCOSA ?- NO ACTIVE INFLAMMATION OR EVIDENCE OF MICROSCOPIC COLITIS ?- NO HIGH GRADE DYSPLASIA OR MALIGNANCY IDENTIFIED ?6. Surgical [P], sigmoid, polyp ?- TUBULAR ADENOMA (3 OF 3 FRAGMENTS) ?- NO HIGH GRADE DYSPLASIA OR MALIGNANCY IDENTIFIED ? ?HPI:   ? ? ?Patient is a 26 y.o. male presenting to the [facility] for follow-up.  Was last seen in the GI clinic by Tye Savoy on 10/13/2021 with c/o diarrhea x2 months. ?- Normal GI PCR panel, ova and parasite, C. difficile, fecal leukocytes ?- Heme-negative stool ? ?His main issue today is that he had an episode of gagging and emesis with bright red streaks that occurred when brushing his teeth last week.  Was a single episode, with a similar episode 4 months ago.  The gagging with teeth brushing is not new for him, but the bright red streaks in emesis was atypical and highly concerning for him.  He shows me pictures on his phone today. ? ?Did subesequently have HB and nocturnal regurgiation. No reflux sxs prior to the episode. No dysphagia.  Has been having increased nausea lately. ? ?Was seen by his PCM for this issue on 12/05/2021.  Recommended restarting omeprazole 40 mg/day (hasnt yet started) and follow-up in GI clinic. ? ?Review of systems:     No chest pain, no SOB, no fevers, no urinary sx  ? ?  Past Medical History:  ?Diagnosis Date  ? Anxiety   ? Chronic headaches   ? Clavicular fracture   ? History of chickenpox   ? Mild  intermittent asthma 06/07/2018  ? Shingles   ? 26 years old.  ? Urticaria 05/18/2020  ? ? ?Patient's surgical history, family medical history, social history, medications and allergies were all reviewed in Epic  ? ? ?Current Outpatient Medications  ?Medication Sig Dispense Refill  ? albuterol (VENTOLIN HFA) 108 (90 Base) MCG/ACT inhaler INHALE 2 PUFFS INTO THE LUNGS EVERY 6 HOURS AS NEEDED FOR WHEEZING OR SHORTNESS OF BREATH 18 g 1  ? AUVI-Q 0.3 MG/0.3ML SOAJ injection SMARTSIG:1 IM Daily    ? ciclesonide (ALVESCO) 160 MCG/ACT inhaler Inhale 2 puffs into the lungs 2 (two) times daily. 1 each 5  ? citalopram (CELEXA) 20 MG tablet TAKE 1 TABLET(20 MG) BY MOUTH DAILY 90 tablet 0  ? EPINEPHrine (EPIPEN IJ) Inject 1 Dose as directed as needed.    ? levocetirizine (XYZAL) 5 MG tablet Take by mouth.    ? montelukast (SINGULAIR) 10 MG tablet Take 1 tablet (10 mg total) by mouth at bedtime. 30 tablet 5  ? NURTEC 75 MG TBDP Take 1 tablet by mouth daily as needed.    ? RESTASIS 0.05 % ophthalmic emulsion 1 drop 2 (two) times daily.    ? sildenafil (VIAGRA) 100 MG tablet Take 0.5-1 tablets (50-100 mg total) by mouth daily as needed for erectile dysfunction. 10 tablet 11  ? clonazePAM (KLONOPIN) 0.5 MG tablet Take 1 tablet (0.5 mg total) by mouth 2 (two) times daily as needed for anxiety. (Patient not taking: Reported on 12/13/2021) 30 tablet 0  ? omeprazole (PRILOSEC) 40 MG capsule Take 1 capsule (40 mg total) by mouth daily. (Patient not taking: Reported on 12/13/2021) 30 capsule 5  ? ?No current facility-administered medications for this visit.  ? ? ?Physical Exam:   ? ? ?BP 128/88   Pulse 83   Ht '6\' 6"'$  (1.981 m)   Wt (!) 381 lb (172.8 kg)   BMI 44.03 kg/m?  ? ?GENERAL:  Pleasant male in NAD ?PSYCH: : Cooperative, normal affect ?EENT:  conjunctiva pink, mucous membranes moist, neck supple without masses ?Musculoskeletal:  Normal muscle tone, normal strength ?NEURO: Alert and oriented x 3, no focal neurologic  deficits ? ? ?IMPRESSION and PLAN:   ? ?1) GERD ?2) Regurgitation ?3) Heartburn ?4) Hematemesis ?5) Nausea ? ?- Start omeprazole 40 mg/day (pre-dinner) ?- Discussed DDx for his recent episode of hematemesis.  Highest suspicion for benign etiology, but he demonstrates elevated concerns.  Due to elevated patient concerns about the hematemesis we will proceed with EGD to evaluate for erosive esophagitis, PUD, etc. ?-Sleep with HOB elevated, avoid eating close to bedtime ? ?6) History of colon polyps ?- Due for repeat colonoscopy this year for ongoing polyp surveillance ?- Schedule colonoscopy ? ?7) IBS-D ?- Has had extensive evaluation as above ?- Can certainly evaluate for mucosal/luminal pathology at time of upper endoscopy/colonoscopy as above with biopsies as appropriate ? ?The indications, risks, and benefits of EGD and colonoscopy were explained to the patient in detail. Risks include but are not limited to bleeding, perforation, adverse reaction to medications, and cardiopulmonary compromise. Sequelae include but are not limited to the possibility of surgery, hospitalization, and mortality. The patient verbalized understanding and wished to proceed. All questions answered, referred to scheduler and bowel prep ordered. Further recommendations pending results of the exam.  ? ? ?Lavena Bullion ,  DO, FACG 12/13/2021, 10:42 AM ? ?

## 2021-12-19 ENCOUNTER — Encounter: Payer: Self-pay | Admitting: Gastroenterology

## 2021-12-21 NOTE — Patient Instructions (Addendum)
Asthma ?Continue Alvesco 160-2 puffs once a day for for asthma control ?Continue montelukast 10 mg once a day to prevent cough or wheeze ?Continue albuterol 2 puffs every 4 hours as needed for cough, wheeze, tightness in chest, or shortness of breath ? ?Allergic rhinitis ?Continue avoidance measures directed toward mold, cockroach, pollens, dog, and cat as listed below ?Continue Flonase 2 sprays in each nostril once a day as needed for a stuffy nose.  In the right nostril, point the applicator out toward the right ear. In the left nostril, point the applicator out toward the left ear ?Continue levocetirizine 5 mg once a day as needed for a runny nose or itch. Remember to rotate to a different antihistamine about every 3 months. Some examples of over the counter antihistamines include Zyrtec (cetirizine), Xyzal (levocetirizine), Allegra (fexofenadine), and Claritin (loratidine).  ?Continue Azelastine 2 sprays in each nostril twice a day as needed for a runny nose ?Consider saline nasal rinses as needed for nasal symptoms. Use this before any medicated nasal sprays for best result ? ?Alpha gal allergy ?Continue to avoid mammalian meat and mammalian products. In case of an allergic reaction, take Benadryl 50 mg every 4 hours, and if life-threatening symptoms occur, inject with EpiPen 0.3 mg. ?We will place an order to recheck your alpha gal levels.  We will call you when the results become available ? ?Food allergy ?Continue to avoid shellfish. In case of an allergic reaction, take Benadryl 50 mg every 4 hours, and if life-threatening symptoms occur, inject with AuviQ 0.3 mg. ?An order has been placed to update your food allergy testing to shrimp, clam, and mussels.  We will call you when the results become available. ? ?Hives ?Take the least amount of medications while remaining hive free ?Levocetirizine (Xyzal) 5 mg twice a day and famotidine (Pepcid) 20 mg twice a day. If no symptoms for 7-14 days then decrease  to? ?Levocetirizine (Xyzal) 5 mg twice a day and famotidine (Pepcid) 20 mg once a day.  If no symptoms for 7-14 days then decrease to? ?Levocetirizine (Xyzal) 5 mg twice a day.  If no symptoms for 7-14 days then decrease to? ?Levocetirizine (Xyzal) 5 mg once a day.  ? ?May use Benadryl (diphenhydramine) as needed for breakthrough hives ?      If symptoms return, then step up dosage ?Keep a detailed symptom journal including foods eaten, contact with allergens, medications taken, weather changes.  ? ?Reflux ?Continue dietary and lifestyle modifications ?Keep your appointment for EGD/colonoscopy as you have planned. ? ?Tinea ?Begin clotrimazole 1 application twice a day to the rash on your upper right back for 1 week. ?Call the clinic if your rash worsens or does not improve ? ?Call the clinic if this treatment plan is not working well for you. ? ?Follow up in 4 months or sooner if needed. ? ?Reducing Pollen Exposure ?The American Academy of Allergy, Asthma and Immunology suggests the following steps to reduce your exposure to pollen during allergy seasons. ?Do not hang sheets or clothing out to dry; pollen may collect on these items. ?Do not mow lawns or spend time around freshly cut grass; mowing stirs up pollen. ?Keep windows closed at night.  Keep car windows closed while driving. ?Minimize morning activities outdoors, a time when pollen counts are usually at their highest. ?Stay indoors as much as possible when pollen counts or humidity is high and on windy days when pollen tends to remain in the air longer. ?Use air conditioning when  possible.  Many air conditioners have filters that trap the pollen spores. ?Use a HEPA room air filter to remove pollen form the indoor air you breathe. ? ?Control of Mold Allergen ?Mold and fungi can grow on a variety of surfaces provided certain temperature and moisture conditions exist.  Outdoor molds grow on plants, decaying vegetation and soil.  The major outdoor mold,  Alternaria and Cladosporium, are found in very high numbers during hot and dry conditions.  Generally, a late Summer - Fall peak is seen for common outdoor fungal spores.  Rain will temporarily lower outdoor mold spore count, but counts rise rapidly when the rainy period ends.  The most important indoor molds are Aspergillus and Penicillium.  Dark, humid and poorly ventilated basements are ideal sites for mold growth.  The next most common sites of mold growth are the bathroom and the kitchen. ? ?Outdoor [employer] ?Use air conditioning and keep windows closed ?Avoid exposure to decaying vegetation. ?Avoid leaf raking. ?Avoid grain handling. ?Consider wearing a face mask if working in moldy areas. ? ?Indoor Mold Control ?Maintain humidity below 50%. ?Clean washable surfaces with 5% bleach solution. ?Remove sources e.g. Contaminated carpets. ? ?Control of Dog or Cat Allergen ?Avoidance is the best way to manage a dog or cat allergy. If you have a dog or cat and are allergic to dog or cats, consider removing the dog or cat from the home. ?If you have a dog or cat but don?t want to find it a new home, or if your family wants a pet even though someone in the household is allergic, here are some strategies that may help keep symptoms at bay: ? ?Keep the pet out of your bedroom and restrict it to only a few rooms. Be advised that keeping the dog or cat in only one room will not limit the allergens to that room. ?Don?t pet, hug or kiss the dog or cat; if you do, wash your hands with soap and water. ?High-efficiency particulate air (HEPA) cleaners run continuously in a bedroom or living room can reduce allergen levels over time. ?Regular use of a high-efficiency vacuum cleaner or a central vacuum can reduce allergen levels. ?Giving your dog or cat a bath at least once a week can reduce airborne allergen. ? ?

## 2021-12-21 NOTE — Progress Notes (Signed)
? ?[facility] ?[address] [place] [address] ?Dept: [phone] ? ?FOLLOW UP NOTE ? ?Patient ID: Bradley [name], male    DOB: [birth year]  Age: 26 y.o. MRN: [id number] ?Date of Office Visit: 12/22/2021 ? ?Assessment  ?Chief Complaint: Asthma (Breathing doing good refill on Alvesco) ? ?HPI ?Bradley [name] is a 26 year old male who presents to the clinic for follow-up visit.  He was last seen in this clinic on 09/30/2021 by Gareth Morgan, FNP, for evaluation of asthma, allergic rhinitis, chronic urticaria, alpha gal allergy, reflux, and food allergy to shellfish.  At today's visit, he reports his asthma has been much more well controlled with rare shortness of breath with wheeze.  He reports most days he does not experience shortness of breath, cough, or wheeze.  He does not experience asthma symptoms with activity.  He continues montelukast 10 mg once a day, Alvesco 160-2 puffs once about every other day, and uses albuterol about once a week with relief of symptoms.  Allergic rhinitis is reported as moderately well controlled with occasional clear rhinorrhea and occasional nasal congestion.  He continues levocetirizine 5 mg once a day and is not currently using Flonase, azelastine, or saline nasal rinses.  His last environmental allergy testing was on 01/18/2018 via blood work and was positive to mold, cockroach, pollens, and pets.  He reports that he developed red, flat, itchy areas occurring on the right side of his upper back about 2 weeks ago.  He reports these are not painful and have not changed in nature since appearing.  He does report that he has a relatively new puppy.  Reflux is reported as well controlled with no medical intervention.  He continues to avoid shrimp, clam, and mussels with no accidental ingestion or EpiPen use since his last visit to this clinic.  He continues to consume crab and lobster with no adverse reaction.  His last food allergy testing was via lab work on 02/07/2019 and was positive to  shellfish.  He continues to avoid mammalian meat with no accidental ingestion or EpiPen use since his last visit to this clinic.  He denies recent tick exposure or tick bites.  His last alpha-gal panel was on 07/20/2021 and remained elevated at that time. His current medications are listed in the chart.  ? ? ?Drug Allergies:  ?Allergies  ?Allergen Reactions  ? Shellfish Allergy Anaphylaxis  ? Hydrocodone Itching  ?  Pt states he feels itchy after taking, but it doesn't preclude him taking hydrocodone if needed ?About 2-3 years ago.  It was the first time he took hydrocodone.  ? Other Diarrhea  ?  Slightly allergic to red meat since tick bite.Marland KitchenMarland KitchenNot anaphylaxis, but GI sensitivity.  ? ? ?Physical Exam: ?BP 116/80 (BP Location: Left Arm, Patient Position: Sitting, Cuff Size: Large)   Pulse 71   Temp 98.4 ?F (36.9 ?C) (Temporal)   Resp 18   SpO2 97%   ? ?Physical Exam ?Vitals reviewed.  ?Constitutional:   ?   Appearance: Normal appearance.  ?HENT:  ?   Head: Normocephalic and atraumatic.  ?   Right Ear: Tympanic membrane normal.  ?   Left Ear: Tympanic membrane normal.  ?   Nose:  ?   Comments: Bilateral nares slightly erythematous with clear nasal drainage noted.  Pharynx normal.  Ears normal.  Eyes normal. ?   Mouth/Throat:  ?   Pharynx: Oropharynx is clear.  ?Eyes:  ?   Conjunctiva/sclera: Conjunctivae normal.  ?Cardiovascular:  ?  Rate and Rhythm: Normal rate and regular rhythm.  ?   Heart sounds: Normal heart sounds. No murmur heard. ?Pulmonary:  ?   Effort: Pulmonary effort is normal.  ?   Breath sounds: Normal breath sounds.  ?   Comments: Lungs clear to auscultation ?Musculoskeletal:     ?   General: Normal range of motion.  ?   Cervical back: Normal range of motion and neck supple.  ?Skin: ?   General: Skin is warm and dry.  ?   Comments: Well demarcated circular, dry, flaky, erythematous rash scattered on right upper back.  No open areas or drainage noted.  ?Neurological:  ?   Mental Status: He is alert  and oriented to person, place, and time.  ?Psychiatric:     ?   Mood and Affect: Mood normal.     ?   Behavior: Behavior normal.     ?   Thought Content: Thought content normal.     ?   Judgment: Judgment normal.  ? ? ?Diagnostics: ?FVC 6.32, FEV1 5.  1 6.  Predicted FVC 6.53, predicted FEV1 4.70.  Spirometry indicates normal ventilatory function. ? ?Assessment and Plan: ?1. Allergy to alpha-gal   ?2. Moderate persistent asthma without complication   ?3. Anaphylactic shock due to food, subsequent encounter   ?4. Seasonal and perennial allergic rhinoconjunctivitis   ?5. Urticaria   ?6. Gastroesophageal reflux disease, unspecified whether esophagitis present   ?7. Tinea corporis   ? ? ?Meds ordered this encounter  ?Medications  ? ciclesonide (ALVESCO) 160 MCG/ACT inhaler  ?  Sig: Inhale 2 puffs into the lungs 2 (two) times daily.  ?  Dispense:  1 each  ?  Refill:  5  ?  Rxbin: [id number] RxPCN: [facility] RxGRP: [id number] Issuer: [phone]) ID#: [id number]  ? ? ?Patient Instructions  ?Asthma ?Continue Alvesco 160-2 puffs once a day for for asthma control ?Continue montelukast 10 mg once a day to prevent cough or wheeze ?Continue albuterol 2 puffs every 4 hours as needed for cough, wheeze, tightness in chest, or shortness of breath ? ?Allergic rhinitis ?Continue avoidance measures directed toward mold, cockroach, pollens, dog, and cat as listed below ?Continue Flonase 2 sprays in each nostril once a day as needed for a stuffy nose.  In the right nostril, point the applicator out toward the right ear. In the left nostril, point the applicator out toward the left ear ?Continue levocetirizine 5 mg once a day as needed for a runny nose or itch. Remember to rotate to a different antihistamine about every 3 months. Some examples of over the counter antihistamines include Zyrtec (cetirizine), Xyzal (levocetirizine), Allegra (fexofenadine), and Claritin (loratidine).  ?Continue Azelastine 2 sprays in each nostril twice a day as needed  for a runny nose ?Consider saline nasal rinses as needed for nasal symptoms. Use this before any medicated nasal sprays for best result ? ?Alpha gal allergy ?Continue to avoid mammalian meat and mammalian products. In case of an allergic reaction, take Benadryl 50 mg every 4 hours, and if life-threatening symptoms occur, inject with EpiPen 0.3 mg. ?We will place an order to recheck your alpha gal levels.  We will call you when the results become available ? ?Food allergy ?Continue to avoid shellfish. In case of an allergic reaction, take Benadryl 50 mg every 4 hours, and if life-threatening symptoms occur, inject with AuviQ 0.3 mg. ?An order has been placed to update your food allergy testing to shrimp, clam, and mussels.  We will  call you when the results become available. ? ?Hives ?Take the least amount of medications while remaining hive free ?Levocetirizine (Xyzal) 5 mg twice a day and famotidine (Pepcid) 20 mg twice a day. If no symptoms for 7-14 days then decrease to? ?Levocetirizine (Xyzal) 5 mg twice a day and famotidine (Pepcid) 20 mg once a day.  If no symptoms for 7-14 days then decrease to? ?Levocetirizine (Xyzal) 5 mg twice a day.  If no symptoms for 7-14 days then decrease to? ?Levocetirizine (Xyzal) 5 mg once a day.  ? ?May use Benadryl (diphenhydramine) as needed for breakthrough hives ?      If symptoms return, then step up dosage ?Keep a detailed symptom journal including foods eaten, contact with allergens, medications taken, weather changes.  ?Call the clinic if this treatment plan is not working well for you ? ?Reflux ?Continue dietary and lifestyle modifications ?Keep your appointment for EGD/colonoscopy as you have planned. ? ?Tinea ?Begin clotrimazole 1 application twice a day to the rash on your upper right back for 1 week. ?Call the clinic if your rash worsens or does not improve ? ?Follow up in 4 months or sooner if needed. ? ? ?Return in about 4 months (around 04/23/2022), or if symptoms  worsen or fail to improve. ?  ? ?Thank you for the opportunity to care for this patient.  Please do not hesitate to contact me with questions. ? ?Gareth Morgan, FNP ?[facility] ? ? ? ? ? ?

## 2021-12-22 ENCOUNTER — Encounter: Payer: Self-pay | Admitting: Family Medicine

## 2021-12-22 ENCOUNTER — Ambulatory Visit: Payer: 59 | Admitting: Family Medicine

## 2021-12-22 VITALS — BP 116/80 | HR 71 | Temp 98.4°F | Resp 18

## 2021-12-22 DIAGNOSIS — Z91018 Allergy to other foods: Secondary | ICD-10-CM | POA: Diagnosis not present

## 2021-12-22 DIAGNOSIS — K219 Gastro-esophageal reflux disease without esophagitis: Secondary | ICD-10-CM

## 2021-12-22 DIAGNOSIS — J302 Other seasonal allergic rhinitis: Secondary | ICD-10-CM

## 2021-12-22 DIAGNOSIS — J4540 Moderate persistent asthma, uncomplicated: Secondary | ICD-10-CM

## 2021-12-22 DIAGNOSIS — L509 Urticaria, unspecified: Secondary | ICD-10-CM | POA: Diagnosis not present

## 2021-12-22 DIAGNOSIS — H1010 Acute atopic conjunctivitis, unspecified eye: Secondary | ICD-10-CM

## 2021-12-22 DIAGNOSIS — B354 Tinea corporis: Secondary | ICD-10-CM

## 2021-12-22 DIAGNOSIS — T7800XD Anaphylactic reaction due to unspecified food, subsequent encounter: Secondary | ICD-10-CM

## 2021-12-22 MED ORDER — ALVESCO 160 MCG/ACT IN AERS: 2.0000 | INHALATION_SPRAY | Freq: Two times a day (BID) | RESPIRATORY_TRACT | 5 refills | Status: DC

## 2021-12-23 ENCOUNTER — Encounter: Payer: Self-pay | Admitting: Adult Health

## 2021-12-23 ENCOUNTER — Ambulatory Visit: Payer: 59 | Admitting: Adult Health

## 2021-12-23 VITALS — BP 120/82 | HR 78 | Temp 98.4°F | Ht 78.0 in | Wt 378.0 lb

## 2021-12-23 DIAGNOSIS — B49 Unspecified mycosis: Secondary | ICD-10-CM

## 2021-12-23 DIAGNOSIS — H6991 Unspecified Eustachian tube disorder, right ear: Secondary | ICD-10-CM

## 2021-12-23 NOTE — Progress Notes (Signed)
? ?Subjective:  ? ? Patient ID: Bradley [name], male    DOB: [birth year], 26 y.o.   MRN: [id number] ? ?HPI ?26 year old male who  has a past medical history of Anxiety, Chronic headaches, Clavicular fracture, History of chickenpox, Mild intermittent asthma (06/07/2018), Shingles, and Urticaria (05/18/2020). ? ?He presents to the office today for an acute issue.  He reports that about 4 days he noticed some hyperpigmented spots on his upper back.  Denies itching or pain.  Feels as though over the last day or 2 the spots have started to lighten up. ? ?Additionally, he complains of right ear discomfort and the feeling of his ear being full.  ? ?Review of Systems ?See HPI  ? ?Past Medical History:  ?Diagnosis Date  ? Anxiety   ? Chronic headaches   ? Clavicular fracture   ? History of chickenpox   ? Mild intermittent asthma 06/07/2018  ? Shingles   ? 26 years old.  ? Urticaria 05/18/2020  ? ? ?Social History  ? ?Socioeconomic History  ? Marital status: Single  ?  Spouse name: Not on file  ? Number of children: 0  ? Years of education: College  ? Highest education level: Not on file  ?Occupational History  ? Occupation: Sales executive  ?Tobacco Use  ? Smoking status: Never  ? Smokeless tobacco: Never  ?Vaping Use  ? Vaping Use: Never used  ?Substance and Sexual Activity  ? Alcohol use: Yes  ?  Alcohol/week: 3.0 standard drinks  ?  Types: 3 Standard drinks or equivalent per week  ?  Comment: every other week. not a big drinker per patient  ? Drug use: Yes  ?  Types: Marijuana  ?  Comment: rare use  ? Sexual activity: Not on file  ?Other Topics Concern  ? Not on file  ?Social History Narrative  ? Lives alone  ? Caffeine use: 1 cup coffee per day  ? Right handed   ? ?Social Determinants of Health  ? ?Financial Resource Strain: Not on file  ?Food Insecurity: Not on file  ?Transportation Needs: Not on file  ?Physical Activity: Not on file  ?Stress: Not on file  ?Social Connections: Not on file  ?Intimate Partner Violence: Not on file   ? ? ?Past Surgical History:  ?Procedure Laterality Date  ? COLONOSCOPY    ? CYSTECTOMY  01/2019  ? scrotum area   ? ESOPHAGEAL MANOMETRY N/A 06/09/2020  ? Procedure: ESOPHAGEAL MANOMETRY (EM);  Surgeon: Lavena Bullion, DO;  Location: [facility];  Service: Gastroenterology;  Laterality: N/A;  ? ESOPHAGOGASTRODUODENOSCOPY    ? shoulder surgery Right 08/26/2019  ? WISDOM TOOTH EXTRACTION    ? ? ?Family History  ?Problem Relation Age of Onset  ? Hypertension Mother   ? Colon polyps Mother   ?     might be mistakrn  ? Diabetes Father   ?     type II  ? Migraines Sister   ? Colon cancer Neg Hx   ? Esophageal cancer Neg Hx   ? Rectal cancer Neg Hx   ? Stomach cancer Neg Hx   ? ? ?Allergies  ?Allergen Reactions  ? Shellfish Allergy Anaphylaxis  ? Hydrocodone Itching  ?  Pt states he feels itchy after taking, but it doesn't preclude him taking hydrocodone if needed ?About 2-3 years ago.  It was the first time he took hydrocodone.  ? Other Diarrhea  ?  Slightly allergic to red meat since tick bite.Marland KitchenMarland KitchenNot  anaphylaxis, but GI sensitivity.  ? ? ?Current Outpatient Medications on File Prior to Visit  ?Medication Sig Dispense Refill  ? albuterol (VENTOLIN HFA) 108 (90 Base) MCG/ACT inhaler INHALE 2 PUFFS INTO THE LUNGS EVERY 6 HOURS AS NEEDED FOR WHEEZING OR SHORTNESS OF BREATH 18 g 1  ? AUVI-Q 0.3 MG/0.3ML SOAJ injection SMARTSIG:1 IM Daily    ? ciclesonide (ALVESCO) 160 MCG/ACT inhaler Inhale 2 puffs into the lungs 2 (two) times daily. 1 each 5  ? citalopram (CELEXA) 20 MG tablet TAKE 1 TABLET(20 MG) BY MOUTH DAILY 90 tablet 0  ? levocetirizine (XYZAL) 5 MG tablet Take by mouth.    ? montelukast (SINGULAIR) 10 MG tablet Take 1 tablet (10 mg total) by mouth at bedtime. 30 tablet 5  ? NURTEC 75 MG TBDP Take 1 tablet by mouth daily as needed.    ? RESTASIS 0.05 % ophthalmic emulsion 1 drop 2 (two) times daily.    ? sildenafil (VIAGRA) 100 MG tablet Take 0.5-1 tablets (50-100 mg total) by mouth daily as needed for erectile  dysfunction. 10 tablet 11  ? ?No current facility-administered medications on file prior to visit.  ? ? ?BP 120/82   Pulse 78   Temp 98.4 ?F (36.9 ?C) (Oral)   Ht '6\' 6"'$  (1.981 m)   Wt (!) 378 lb (171.5 kg)   SpO2 97%   BMI 43.68 kg/m?  ? ? ?   ?Objective:  ? Physical Exam ?Vitals and nursing note reviewed.  ?Constitutional:   ?   Appearance: Normal appearance.  ?HENT:  ?   Right Ear: A middle ear effusion is present. Tympanic membrane is not erythematous or bulging.  ?   Left Ear:  No middle ear effusion. Tympanic membrane is not erythematous or bulging.  ?Skin: ?   Comments: He has 5 flat slightly hyperpigmented circles with slightly raised edges on his right upper back.  ?Neurological:  ?   Mental Status: He is alert.  ? ?   ?Assessment & Plan:  ?1. Eustachian tube disorder, right ?- advised flonase or afrin but if he uses afrin no longer than 3 days  ? ?2. Fungal infection ?- Can use OTC anti fungal cream  ?- Follow up as needed ? ?Dorothyann Peng, NP ? ? ? ?

## 2021-12-26 ENCOUNTER — Encounter: Payer: Self-pay | Admitting: Gastroenterology

## 2021-12-26 ENCOUNTER — Ambulatory Visit (AMBULATORY_SURGERY_CENTER): Payer: 59 | Admitting: Gastroenterology

## 2021-12-26 VITALS — BP 140/94 | HR 63 | Temp 99.3°F | Resp 16 | Ht 78.0 in | Wt 381.0 lb

## 2021-12-26 DIAGNOSIS — K2960 Other gastritis without bleeding: Secondary | ICD-10-CM | POA: Diagnosis not present

## 2021-12-26 DIAGNOSIS — R12 Heartburn: Secondary | ICD-10-CM | POA: Diagnosis not present

## 2021-12-26 DIAGNOSIS — K640 First degree hemorrhoids: Secondary | ICD-10-CM

## 2021-12-26 DIAGNOSIS — R197 Diarrhea, unspecified: Secondary | ICD-10-CM

## 2021-12-26 DIAGNOSIS — K2951 Unspecified chronic gastritis with bleeding: Secondary | ICD-10-CM

## 2021-12-26 DIAGNOSIS — K5730 Diverticulosis of large intestine without perforation or abscess without bleeding: Secondary | ICD-10-CM

## 2021-12-26 DIAGNOSIS — K920 Hematemesis: Secondary | ICD-10-CM | POA: Diagnosis not present

## 2021-12-26 DIAGNOSIS — K635 Polyp of colon: Secondary | ICD-10-CM

## 2021-12-26 DIAGNOSIS — K219 Gastro-esophageal reflux disease without esophagitis: Secondary | ICD-10-CM

## 2021-12-26 DIAGNOSIS — D125 Benign neoplasm of sigmoid colon: Secondary | ICD-10-CM

## 2021-12-26 DIAGNOSIS — Z86010 Personal history of colonic polyps: Secondary | ICD-10-CM

## 2021-12-26 MED ORDER — SODIUM CHLORIDE 0.9 % IV SOLN: 500.0000 mL | Freq: Once | INTRAVENOUS | Status: DC

## 2021-12-26 NOTE — Op Note (Signed)
[place] ?Patient Name: Bradley [name] ?Procedure Date: 12/26/2021 3:03 PM ?MRN: [id number] ?Endoscopist: Gerrit Heck , MD ?Age: 26 ?Referring MD:  ?Date of Birth: [birth year] ?Gender: Male ?Account #: [id number] ?Procedure:                Colonoscopy ?Indications:              Surveillance: Personal history of adenomatous  ?                          polyps on last colonoscopy 3 years ago ?                          Colonoscopy in 02/2019 with 12 mm cecal adenoma and  ?                          4 mm sigmoid adenoma, with recommendation to repeat  ?                          in 3 years. ?                          Additionally, history of chronic diarrhea ?Medicines:                Monitored Anesthesia Care ?Procedure:                Pre-Anesthesia Assessment: ?                          - Prior to the procedure, a History and Physical  ?                          was performed, and patient medications and  ?                          allergies were reviewed. The patient's tolerance of  ?                          previous anesthesia was also reviewed. The risks  ?                          and benefits of the procedure and the sedation  ?                          options and risks were discussed with the patient.  ?                          All questions were answered, and informed consent  ?                          was obtained. Prior Anticoagulants: The patient has  ?                          taken no previous anticoagulant or antiplatelet  ?  agents. ASA Grade Assessment: III - A patient with  ?                          severe systemic disease. After reviewing the risks  ?                          and benefits, the patient was deemed in  ?                          satisfactory condition to undergo the procedure. ?                          After obtaining informed consent, the colonoscope  ?                          was passed under direct vision. Throughout the  ?                           procedure, the patient's blood pressure, pulse, and  ?                          oxygen saturations were monitored continuously. The  ?                          Olympus CF-HQ190L ([id number]) Colonoscope was  ?                          introduced through the anus and advanced to the the  ?                          terminal ileum. The colonoscopy was performed  ?                          without difficulty. The patient tolerated the  ?                          procedure well. The quality of the bowel  ?                          preparation was good. The terminal ileum, ileocecal  ?                          valve, appendiceal orifice, and rectum were  ?                          photographed. ?Scope In: 3:19:11 PM ?Scope Out: 3:37:52 PM ?Scope Withdrawal Time: 0 hours 16 minutes 8 seconds  ?Total Procedure Duration: 0 hours 18 minutes 41 seconds  ?Findings:                 The perianal and digital rectal examinations were  ?                          normal. ?  A 4 mm polyp was found in the sigmoid colon. The  ?                          polyp was sessile. The polyp was removed with a  ?                          cold snare. Resection and retrieval were complete.  ?                          Estimated blood loss was minimal. ?                          A few small-mouthed diverticula were found in the  ?                          sigmoid colon. ?                          A localized area of mildly erythematous mucosa was  ?                          found in the sigmoid colon, located 30-40 cm from  ?                          the anal verge. There were no erosions or  ?                          ulcerations. Biopsies were taken with a cold  ?                          forceps for histology. Estimated blood loss was  ?                          minimal. ?                          Otherwise, the mucosa was normal throughout the  ?                          remainder of the colon, including the rectum,  ?                           descending colon, transverse colon, ascending  ?                          colon, and in the cecum. Biopsies for histology  ?                          were taken with a cold forceps from the right colon  ?                          and left colon for evaluation of microscopic  ?  colitis. Estimated blood loss was minimal. ?                          Non-bleeding internal hemorrhoids were found during  ?                          retroflexion. The hemorrhoids were small. ?                          The terminal ileum appeared normal. ?Complications:            No immediate complications. ?Estimated Blood Loss:     Estimated blood loss was minimal. ?Impression:               - One 4 mm polyp in the sigmoid colon, removed with  ?                          a cold snare. Resected and retrieved. ?                          - Diverticulosis in the sigmoid colon. ?                          - Erythematous mucosa in the sigmoid colon.  ?                          Biopsied. ?                          - Normal mucosa in the rectum, in the descending  ?                          colon, in the transverse colon, in the ascending  ?                          colon and in the cecum. Biopsied. ?                          - Non-bleeding internal hemorrhoids. ?                          - The examined portion of the ileum was normal. ?Recommendation:           - Patient has a contact number available for  ?                          emergencies. The signs and symptoms of potential  ?                          delayed complications were discussed with the  ?                          patient. Return to normal activities tomorrow.  ?                          Written discharge instructions were provided to  the  ?                          patient. ?                          - Resume previous diet. ?                          - Continue present medications. ?                          - Await pathology results. ?                           - Repeat colonoscopy in 5 years for surveillance. ?                          - Return to GI office PRN. ?Gerrit Heck, MD ?12/26/2021 3:50:17 PM ?

## 2021-12-26 NOTE — Op Note (Signed)
[place] ?Patient Name: Bradley [name] ?Procedure Date: 12/26/2021 3:04 PM ?MRN: [id number] ?Endoscopist: Gerrit Heck , MD ?Age: 26 ?Referring MD:  ?Date of Birth: Dec 16, 1995 ?Gender: Male ?Account #: [id number] ?Procedure:                Upper GI endoscopy ?Indications:              Heartburn, Suspected esophageal reflux,  ?                          Hematemesis, Diarrhea, Nausea ?Medicines:                Monitored Anesthesia Care ?Procedure:                Pre-Anesthesia Assessment: ?                          - Prior to the procedure, a History and Physical  ?                          was performed, and patient medications and  ?                          allergies were reviewed. The patient's tolerance of  ?                          previous anesthesia was also reviewed. The risks  ?                          and benefits of the procedure and the sedation  ?                          options and risks were discussed with the patient.  ?                          All questions were answered, and informed consent  ?                          was obtained. Prior Anticoagulants: The patient has  ?                          taken no previous anticoagulant or antiplatelet  ?                          agents. ASA Grade Assessment: III - A patient with  ?                          severe systemic disease. After reviewing the risks  ?                          and benefits, the patient was deemed in  ?                          satisfactory condition to undergo the procedure. ?  After obtaining informed consent, the endoscope was  ?                          passed under direct vision. Throughout the  ?                          procedure, the patient's blood pressure, pulse, and  ?                          oxygen saturations were monitored continuously. The  ?                          GIF HQ190 #[id number] was introduced through the  ?                          mouth, and advanced to the second part  of duodenum.  ?                          The upper GI endoscopy was accomplished without  ?                          difficulty. The patient tolerated the procedure  ?                          well. ?Scope In: ?Scope Out: ?Findings:                 The examined esophagus was normal. ?                          The Z-line was regular and was found 42 cm from the  ?                          incisors. ?                          The entire examined stomach was normal. Biopsies  ?                          were taken with a cold forceps for Helicobacter  ?                          pylori testing. Estimated blood loss was minimal. ?                          The examined duodenum was normal. Biopsies were  ?                          taken with a cold forceps for histology. Estimated  ?                          blood loss was minimal. ?Complications:            No immediate complications. ?Estimated Blood Loss:     Estimated blood loss was minimal. ?Impression:               -  Normal esophagus. ?                          - Z-line regular, 42 cm from the incisors. ?                          - Normal stomach. Biopsied. ?                          - Normal examined duodenum. Biopsied. ?Recommendation:           - Patient has a contact number available for  ?                          emergencies. The signs and symptoms of potential  ?                          delayed complications were discussed with the  ?                          patient. Return to normal activities tomorrow.  ?                          Written discharge instructions were provided to the  ?                          patient. ?                          - Resume previous diet. ?                          - Continue present medications. ?                          - Await pathology results. ?Gerrit Heck, MD ?12/26/2021 3:43:19 PM ?

## 2021-12-26 NOTE — Patient Instructions (Signed)
Handouts given on polyps, diverticulosis, and hemorrhoids. Await pathology results. ?Resume previous diet and continue present medications. ?Repeat colonoscopy for surveillance in 5 years. ? ? ?YOU HAD AN ENDOSCOPIC PROCEDURE TODAY AT [facility]:   Refer to the procedure report that was given to you for any specific questions about what was found during the examination.  If the procedure report does not answer your questions, please call your gastroenterologist to clarify.  If you requested that your care partner not be given the details of your procedure findings, then the procedure report has been included in a sealed envelope for you to review at your convenience later. ? ?YOU SHOULD EXPECT: Some feelings of bloating in the abdomen. Passage of more gas than usual.  Walking can help get rid of the air that was put into your GI tract during the procedure and reduce the bloating. If you had a lower endoscopy (such as a colonoscopy or flexible sigmoidoscopy) you may notice spotting of blood in your stool or on the toilet paper. If you underwent a bowel prep for your procedure, you may not have a normal bowel movement for a few days. ? ?Please Note:  You might notice some irritation and congestion in your nose or some drainage.  This is from the oxygen used during your procedure.  There is no need for concern and it should clear up in a day or so. ? ?SYMPTOMS TO REPORT IMMEDIATELY: ? ?Following lower endoscopy (colonoscopy or flexible sigmoidoscopy): ? Excessive amounts of blood in the stool ? Significant tenderness or worsening of abdominal pains ? Swelling of the abdomen that is new, acute ? Fever of 100?F or higher ? ?Following upper endoscopy (EGD) ? Vomiting of blood or coffee ground material ? New chest pain or pain under the shoulder blades ? Painful or persistently difficult swallowing ? New shortness of breath ? Fever of 100?F or higher ? Black, tarry-looking stools ? ?For urgent or emergent  issues, a gastroenterologist can be reached at any hour by calling [phone]. ?Do not use MyChart messaging for urgent concerns.  ? ? ?DIET:  We do recommend a small meal at first, but then you may proceed to your regular diet.  Drink plenty of fluids but you should avoid alcoholic beverages for 24 hours. ? ?ACTIVITY:  You should plan to take it easy for the rest of today and you should NOT DRIVE or use heavy machinery until tomorrow (because of the sedation medicines used during the test).   ? ?FOLLOW UP: ?Our staff will call the number listed on your records 48-72 hours following your procedure to check on you and address any questions or concerns that you may have regarding the information given to you following your procedure. If we do not reach you, we will leave a message.  We will attempt to reach you two times.  During this call, we will ask if you have developed any symptoms of COVID 19. If you develop any symptoms (ie: fever, flu-like symptoms, shortness of breath, cough etc.) before then, please call [phone].  If you test positive for Covid 19 in the 2 weeks post procedure, please call and report this information to [place].   ? ?If any biopsies were taken you will be contacted by phone or by letter within the next 1-3 weeks.  Please call us at [phone] if you have not heard about the biopsies in 3 weeks.  ? ? ?SIGNATURES/CONFIDENTIALITY: ?You and/or your care partner have  signed paperwork which will be entered into your electronic medical record.  These signatures attest to the fact that that the information above on your After Visit Summary has been reviewed and is understood.  Full responsibility of the confidentiality of this discharge information lies with you and/or your care-partner.  ?

## 2021-12-26 NOTE — Progress Notes (Signed)
Pt's states no medical or surgical changes since previsit or office visit. 

## 2021-12-26 NOTE — Progress Notes (Signed)
? ?GASTROENTEROLOGY PROCEDURE H&P NOTE  ? ?Primary Care Physician: ?Eulas Post, MD ? ? ? ?Reason for Procedure:  GERD, regurgitation, heartburn, hematemesis, nausea; history of colon polyps, diarrhea ? ?Plan:    EGD, colonoscopy ? ?Patient is appropriate for endoscopic procedure(s) in the ambulatory ([facility]) setting. ? ?The nature of the procedure, as well as the risks, benefits, and alternatives were carefully and thoroughly reviewed with the patient. Ample time for discussion and questions allowed. The patient understood, was satisfied, and agreed to proceed.  ? ? ? ?HPI: ?Bradley [name] is a 26 y.o. male who presents for EGD and colonoscopy for evaluation of a multitude of GI symptoms to include GERD, regurgitation, heartburn, hematemesis, nausea, diarrhea, along with continued surveillance for history of colon polyps.  He was last seen in the office by me on 12/13/2021.  No changes in medical history since then. ? ?Past Medical History:  ?Diagnosis Date  ? Anxiety   ? Chronic headaches   ? Clavicular fracture   ? History of chickenpox   ? Mild intermittent asthma 06/07/2018  ? Shingles   ? 26 years old.  ? Urticaria 05/18/2020  ? ? ?Past Surgical History:  ?Procedure Laterality Date  ? COLONOSCOPY    ? CYSTECTOMY  01/2019  ? scrotum area   ? ESOPHAGEAL MANOMETRY N/A 06/09/2020  ? Procedure: ESOPHAGEAL MANOMETRY (EM);  Surgeon: Lavena Bullion, DO;  Location: [facility];  Service: Gastroenterology;  Laterality: N/A;  ? ESOPHAGOGASTRODUODENOSCOPY    ? shoulder surgery Right 08/26/2019  ? WISDOM TOOTH EXTRACTION    ? ? ?Prior to Admission medications   ?Medication Sig Start Date End Date Taking? Authorizing Provider  ?ciclesonide (ALVESCO) 160 MCG/ACT inhaler Inhale 2 puffs into the lungs 2 (two) times daily. 12/22/21  Yes Ambs, Kathrine Cords, FNP  ?citalopram (CELEXA) 20 MG tablet TAKE 1 TABLET(20 MG) BY MOUTH DAILY 12/12/21  Yes Burchette, Alinda Sierras, MD  ?levocetirizine (XYZAL) 5 MG tablet Take by mouth.   Yes  [provider]  ?montelukast (SINGULAIR) 10 MG tablet Take 1 tablet (10 mg total) by mouth at bedtime. 09/30/21  Yes Ambs, Kathrine Cords, FNP  ?RESTASIS 0.05 % ophthalmic emulsion 1 drop 2 (two) times daily. 08/01/21  Yes [provider]  ?albuterol (VENTOLIN HFA) 108 (90 Base) MCG/ACT inhaler INHALE 2 PUFFS INTO THE LUNGS EVERY 6 HOURS AS NEEDED FOR WHEEZING OR SHORTNESS OF BREATH 10/24/21   Ambs, Kathrine Cords, FNP  ?AUVI-Q 0.3 MG/0.3ML SOAJ injection SMARTSIG:1 IM Daily 09/06/21   [provider]  ?NURTEC 75 MG TBDP Take 1 tablet by mouth daily as needed. 09/01/21   [provider]  ?sildenafil (VIAGRA) 100 MG tablet Take 0.5-1 tablets (50-100 mg total) by mouth daily as needed for erectile dysfunction. 12/24/20   Burchette, Alinda Sierras, MD  ? ? ?Current Outpatient Medications  ?Medication Sig Dispense Refill  ? ciclesonide (ALVESCO) 160 MCG/ACT inhaler Inhale 2 puffs into the lungs 2 (two) times daily. 1 each 5  ? citalopram (CELEXA) 20 MG tablet TAKE 1 TABLET(20 MG) BY MOUTH DAILY 90 tablet 0  ? levocetirizine (XYZAL) 5 MG tablet Take by mouth.    ? montelukast (SINGULAIR) 10 MG tablet Take 1 tablet (10 mg total) by mouth at bedtime. 30 tablet 5  ? RESTASIS 0.05 % ophthalmic emulsion 1 drop 2 (two) times daily.    ? albuterol (VENTOLIN HFA) 108 (90 Base) MCG/ACT inhaler INHALE 2 PUFFS INTO THE LUNGS EVERY 6 HOURS AS NEEDED FOR WHEEZING OR  SHORTNESS OF BREATH 18 g 1  ? AUVI-Q 0.3 MG/0.3ML SOAJ injection SMARTSIG:1 IM Daily    ? NURTEC 75 MG TBDP Take 1 tablet by mouth daily as needed.    ? sildenafil (VIAGRA) 100 MG tablet Take 0.5-1 tablets (50-100 mg total) by mouth daily as needed for erectile dysfunction. 10 tablet 11  ? ?Current Facility-Administered Medications  ?Medication Dose Route Frequency Provider Last Rate Last Admin  ? 0.9 %  sodium chloride infusion  500 mL Intravenous Once Jocie Meroney V, DO      ? ? ?Allergies as of 12/26/2021 - Review Complete 12/26/2021  ?Allergen Reaction  Noted  ? Shellfish allergy Anaphylaxis 02/12/2018  ? Hydrocodone Itching 12/08/2017  ? Other Diarrhea 04/14/2020  ? ? ?Family History  ?Problem Relation Age of Onset  ? Hypertension Mother   ? Colon polyps Mother   ?     might be mistakrn  ? Diabetes Father   ?     type II  ? Migraines Sister   ? Colon cancer Neg Hx   ? Esophageal cancer Neg Hx   ? Rectal cancer Neg Hx   ? Stomach cancer Neg Hx   ? ? ?Social History  ? ?Socioeconomic History  ? Marital status: Single  ?  Spouse name: Not on file  ? Number of children: 0  ? Years of education: College  ? Highest education level: Not on file  ?Occupational History  ? Occupation: Sales executive  ?Tobacco Use  ? Smoking status: Never  ? Smokeless tobacco: Never  ?Vaping Use  ? Vaping Use: Never used  ?Substance and Sexual Activity  ? Alcohol use: Yes  ?  Alcohol/week: 3.0 standard drinks  ?  Types: 3 Standard drinks or equivalent per week  ?  Comment: every other week. not a big drinker per patient  ? Drug use: Yes  ?  Types: Marijuana  ?  Comment: rare use  ? Sexual activity: Not on file  ?Other Topics Concern  ? Not on file  ?Social History Narrative  ? Lives alone  ? Caffeine use: 1 cup coffee per day  ? Right handed   ? ?Social Determinants of Health  ? ?Financial Resource Strain: Not on file  ?Food Insecurity: Not on file  ?Transportation Needs: Not on file  ?Physical Activity: Not on file  ?Stress: Not on file  ?Social Connections: Not on file  ?Intimate Partner Violence: Not on file  ? ? ?Physical Exam: ?Vital signs in last 24 hours: ?'@BP'$  124/80   Pulse 74   Temp 99.3 ?F (37.4 ?C)   Ht '6\' 6"'$  (1.981 m)   Wt (!) 381 lb (172.8 kg)   SpO2 96%   BMI 44.03 kg/m?  ?GEN: NAD ?EYE: Sclerae anicteric ?ENT: MMM ?CV: Non-tachycardic ?Pulm: CTA b/l ?GI: Soft, NT/ND ?NEURO:  Alert & Oriented x 3 ? ? ?Gerrit Heck, DO ?[facility] ? ? ?12/26/2021 2:53 PM ? ?

## 2021-12-27 ENCOUNTER — Telehealth: Payer: Self-pay | Admitting: Gastroenterology

## 2021-12-27 NOTE — Telephone Encounter (Signed)
Inbound call from patient states had procedure 4/3. Reports last night he began having chills and hot flashes. Also states his appetite have not returned to normal. Would like to know if this is normal post procedure ?

## 2021-12-27 NOTE — Telephone Encounter (Signed)
Called and spoke with patient.  He said today he is more feeling hot flashes verses chills.  He had not checked his temperature.  Also stated his appetite still hasn't returned. ? ?I advised patient to check his temperature and to call back if its over 100.  Also explained it can take a couple days for appetite to return to normal.  Patient is aware that we will call  him tomorrow morning for post procedure follow up call.  ?

## 2021-12-28 ENCOUNTER — Encounter: Payer: Self-pay | Admitting: Family Medicine

## 2021-12-28 ENCOUNTER — Telehealth: Payer: Self-pay | Admitting: Gastroenterology

## 2021-12-28 ENCOUNTER — Other Ambulatory Visit: Payer: Self-pay

## 2021-12-28 ENCOUNTER — Telehealth: Payer: Self-pay

## 2021-12-28 ENCOUNTER — Telehealth (INDEPENDENT_AMBULATORY_CARE_PROVIDER_SITE_OTHER): Payer: 59 | Admitting: Family Medicine

## 2021-12-28 ENCOUNTER — Encounter: Payer: Self-pay | Admitting: Gastroenterology

## 2021-12-28 DIAGNOSIS — R110 Nausea: Secondary | ICD-10-CM | POA: Diagnosis not present

## 2021-12-28 MED ORDER — SUCRALFATE 1 G PO TABS: 1.0000 g | ORAL_TABLET | Freq: Two times a day (BID) | ORAL | 1 refills | Status: DC

## 2021-12-28 NOTE — Progress Notes (Signed)
Patient ID: [name], male   DOB: 1996-05-16, 26 y.o.   MRN: [id number] ? ? ?This visit type was conducted due to national recommendations for restrictions regarding the COVID-19 pandemic in an effort to limit this patient's exposure and mitigate transmission in our community.  ? ?Virtual Visit via Telephone Note ? ?I connected with Bradley [name] on 12/28/21 at  5:00 PM EDT by telephone and verified that I am speaking with the correct person using two identifiers. ?  ?I discussed the limitations, risks, security and privacy concerns of performing an evaluation and management service by telephone and the availability of in person appointments. I also discussed with the patient that there may be a patient responsible charge related to this service. The patient expressed understanding and agreed to proceed. ? ?Location patient: home ?Location provider: work or home office ?Participants present for the call: patient, provider ?Patient did not have a visit in the prior 7 days to address this/these issue(s). ? ? ?History of Present Illness: ? ?Bradley [name] had EGD and colonoscopy on Monday.  His GI provider apparently is out of town.  He called with concerns that he had some mild nausea and decreased appetite since those procedures.  He is eating some but this morning when brushing teeth had increased gag reflex which he is prone to and had 1 episode of vomiting with very small amount of blood-tinged mucus.  He sent a picture and this appeared to be extremely small volume.  No coffee ground emesis.  No melena.  No abdominal distention.  No significant abdominal pain.  No difficulty swallowing.  GI apparently called in Carafate earlier today 1 g twice daily.  He has not yet started that.  He has no significant pain at this time.  Keeping down fluids well.  Biopsies for Helicobacter pylori and sigmoid biopsy pending ? ?Past Medical History:  ?Diagnosis Date  ? Anxiety   ? Chronic headaches   ? Clavicular fracture   ? History of  chickenpox   ? Mild intermittent asthma 06/07/2018  ? Shingles   ? 26 years old.  ? Urticaria 05/18/2020  ? ?Past Surgical History:  ?Procedure Laterality Date  ? COLONOSCOPY    ? CYSTECTOMY  01/2019  ? scrotum area   ? ESOPHAGEAL MANOMETRY N/A 06/09/2020  ? Procedure: ESOPHAGEAL MANOMETRY (EM);  Surgeon: Lavena Bullion, DO;  Location: [facility];  Service: Gastroenterology;  Laterality: N/A;  ? ESOPHAGOGASTRODUODENOSCOPY    ? shoulder surgery Right 08/26/2019  ? WISDOM TOOTH EXTRACTION    ? ? reports that he has never smoked. He has never used smokeless tobacco. He reports current alcohol use of about 3.0 standard drinks per week. He reports current drug use. Drug: Marijuana. ?family history includes Colon polyps in his mother; Diabetes in his father; Hypertension in his mother; Migraines in his sister. ?Allergies  ?Allergen Reactions  ? Shellfish Allergy Anaphylaxis  ? Hydrocodone Itching  ?  Pt states he feels itchy after taking, but it doesn't preclude him taking hydrocodone if needed ?About 2-3 years ago.  It was the first time he took hydrocodone.  ? Other Diarrhea  ?  Slightly allergic to red meat since tick bite.Marland KitchenMarland KitchenNot anaphylaxis, but GI sensitivity.  ? ? ?  ?Observations/Objective: ?Patient sounds cheerful and well on the phone. ?I do not appreciate any SOB. ?Speech and thought processing are grossly intact. ?Patient reported vitals: ? ?Assessment and Plan: ? ?Mild nausea and decreased appetite following EGD and colonoscopy Monday.  He is keeping  down fluids and had only 1 episode of vomiting after gagging while brushing teeth.  Does not have any worrisome symptoms such as abdominal distention, abdominal pain, significant hematochezia, melena, etc. ? ?-Recommend he start the Carafate that was called in by GI earlier today.  Reviewed signs and symptoms of things to watch for.  Gradually progress diet as tolerated ?Follow-up with GI for any recurrent vomiting or other concerns ? ?Follow Up  Instructions: ? ?DIAGMED@ ? ? ?[id number] 5-10 ?[id number] 11-20 ?[id number] 21-30 ?I did not refer this patient for an OV in the next 24 hours for this/these issue(s). ? ?I discussed the assessment and treatment plan with the patient. The patient was provided an opportunity to ask questions and all were answered. The patient agreed with the plan and demonstrated an understanding of the instructions. ?  ?The patient was advised to call back or seek an in-person evaluation if the symptoms worsen or if the condition fails to improve as anticipated. ? ?I provided 13 minutes of non-face-to-face time during this encounter. ? ? ?Carolann Littler, MD  ? ?

## 2021-12-28 NOTE — Telephone Encounter (Signed)
Patient called seeking advise. On 12/26/21 patient had procedure done. He received a follow up call this morning. After that call he had vomited with some blood. Patient wants to know if that's normal or if he should be concerned? He reached out to [place] on mychart as well. Please advise.   ?

## 2021-12-28 NOTE — Telephone Encounter (Signed)
Left message for pt to call back  °

## 2021-12-28 NOTE — Telephone Encounter (Signed)
Spoke with pt. Gave pt recommendations. Pt denies having a cough. Pt states he has omeprazole at home and requested to start the carafate. Prescription sent to pt's pharmacy. Pt verbalized understanding of recommendations and had no other concerns at end of call.  ?

## 2021-12-28 NOTE — Telephone Encounter (Signed)
I have reviewed the patient's EGD/colonoscopy report.  I have also reviewed the patient's clinical history and his recent evaluation in clinic with Dr. Bryan Lemma.  These were similar issues in regards to the vomiting up of blood specks.  With his negative work-up, I wonder if there is any chance that this could end up being more pulmonary if he has been coughing.  If it was true vomitus that occurred then this likely is just a result of mild mucosal injury from the retching.  If he has more significant episodes of bleeding he will need to come into the emergency department for further evaluation.  I would recommend he restart PPI, although I do not see it on his medication list, it was noted that Cirigliano wanted him on 40 mg daily PPI at the time of his clinic visit.  I would restart that.  If patient is having some decreased appetite as well, could consider Carafate therapy 1 g twice daily.  From a diet perspective, would do a bland BRAT diet for now and slowly advance from there. ?LG or BP, please reach out to patient with update from my review of his chart.  Also please reach out to patient in the next couple of days to see where things stand and update the MD of the day.  Thanks. ?GM ?

## 2021-12-28 NOTE — Telephone Encounter (Addendum)
Spoke with pt. Pt is very concerned about small amounts of blood. Explained to pt that the EGD and colonoscopy were not done as treatment but rather as a diagnostic tool. Also explained that EGD report stated that esophagus and stomach appeared to be normal but that biopsies were taken and he will be contacted once we receive the pathology results. Pt also expressed concern about decreased appetite since procedure. Pt believes that colonoscopy has caused his decreased appetite wanted to know what to do. Pt requested that message be sent to doctor. Let pt know that Dr.Cirigliano is out of the office this week but I will send message to covering doctor.  ? ?Dr. Rush Landmark as DOD please advise.  ?

## 2021-12-28 NOTE — Telephone Encounter (Signed)
Noted.  We discussed during office visit today. ?

## 2021-12-28 NOTE — Telephone Encounter (Signed)
?  Follow up Call- ? ? ?  12/26/2021  ?  2:29 PM  ?Call back number  ?Post procedure Call Back phone  # [phone]  ?Permission to leave phone message Yes  ?  ? ?Patient questions: ? ?Do you have a fever, pain , or abdominal swelling? No. ?Pain Score  0 * ? ?Have you tolerated food without any problems? Yes.   ? ?Have you been able to return to your normal activities? Yes.   ? ?Do you have any questions about your discharge instructions: ?Diet   No. ?Medications  No. ?Follow up visit  No. ? ?Do you have questions or concerns about your Care? No. ? ?Actions: ?* If pain score is 4 or above: ?No action needed, pain <4. ? ? ?

## 2021-12-28 NOTE — Telephone Encounter (Signed)
See 4/5 mychart message.  ?

## 2021-12-29 ENCOUNTER — Other Ambulatory Visit: Payer: Self-pay | Admitting: *Deleted

## 2021-12-29 MED ORDER — ALVESCO 160 MCG/ACT IN AERS: 2.0000 | INHALATION_SPRAY | Freq: Two times a day (BID) | RESPIRATORY_TRACT | 5 refills | Status: DC

## 2021-12-30 ENCOUNTER — Emergency Department
Admission: EM | Admit: 2021-12-30 | Discharge: 2021-12-30 | Disposition: A | Discharge status: Home / Self Care | Payer: 59 | Source: Home / Self Care

## 2021-12-30 ENCOUNTER — Emergency Department (INDEPENDENT_AMBULATORY_CARE_PROVIDER_SITE_OTHER): Payer: 59

## 2021-12-30 ENCOUNTER — Encounter: Payer: Self-pay | Admitting: Emergency Medicine

## 2021-12-30 ENCOUNTER — Encounter: Payer: Self-pay | Admitting: Gastroenterology

## 2021-12-30 ENCOUNTER — Other Ambulatory Visit: Payer: Self-pay

## 2021-12-30 DIAGNOSIS — R630 Anorexia: Secondary | ICD-10-CM

## 2021-12-30 DIAGNOSIS — R634 Abnormal weight loss: Secondary | ICD-10-CM | POA: Diagnosis not present

## 2021-12-30 DIAGNOSIS — R110 Nausea: Secondary | ICD-10-CM

## 2021-12-30 MED ORDER — ONDANSETRON 8 MG PO TBDP: 8.0000 mg | ORAL_TABLET | Freq: Three times a day (TID) | ORAL | 0 refills | Status: DC | PRN

## 2021-12-30 NOTE — Discharge Instructions (Addendum)
Advised/informed patient of KUB x-ray results this afternoon.  Advised patient we will follow-up tomorrow, 12/31/2021, early afternoon with lab results.  Advised patient may use Zofran daily or as needed for nausea.  Advised patient to adhere to bland/brat diet gradually returning to normal diet.  Advised patient if symptoms worsen and/or unresolved please follow-up with GI, PCP, or here for further evaluation. ?

## 2021-12-30 NOTE — ED Triage Notes (Signed)
Had a colonoscopy on Monday, no appetite, nausea, 99.7 temp, all week, lost 15 lbs this week. Not feeling right. ?Vaccinated ?

## 2021-12-30 NOTE — ED Provider Notes (Signed)
?[facility] ? ? ? ?CSN: [id number] ?Arrival date & time: 12/30/21  1212 ? ? ?  ? ?History   ?Chief Complaint ?Chief Complaint  ?Patient presents with  ? Nausea  ? ? ?HPI ?Bradley [name] is a 26 y.o. male.  ? ?HPI 26 year old male presents with loss of appetite nausea, low-grade fever and weight loss of 15 pounds last week.  Patient reports having colonoscopy on Monday and not feeling right since.  Patient had telehealth visit with PCP on 12/28/2021 for similar symptoms.  Patient denies abdominal pain.  PMH significant for generalized abdominal pain. ? ?Past Medical History:  ?Diagnosis Date  ? Anxiety   ? Chronic headaches   ? Clavicular fracture   ? History of chickenpox   ? Mild intermittent asthma 06/07/2018  ? Shingles   ? 26 years old.  ? Urticaria 05/18/2020  ? ? ?Patient Active Problem List  ? Diagnosis Date Noted  ? Not well controlled moderate persistent asthma 09/30/2021  ? Allergy to alpha-gal 06/16/2021  ? Right ankle pain 07/06/2020  ? Dysphagia   ? Gastroesophageal reflux disease 05/19/2020  ? Urticaria 05/18/2020  ? Seasonal and perennial allergic rhinoconjunctivitis 04/19/2020  ? Mild persistent asthma, uncomplicated 16/03/3709  ? Low back pain 12/18/2019  ? Shortness of breath 10/15/2019  ? 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 Hamstring injury, right, initial encounter 01/23/2019  ? Migraine without aura and without status migrainosus, not intractable 07/10/2018  ? Morbid obesity with BMI of 40.0-44.9, adult ([place]) 07/10/2018  ? Mild intermittent asthma without complication 62/69/4854  ? Atypical chest pain 12/21/2017  ? Generalized abdominal pain 12/17/2017  ? Anxiety 11/28/2017  ? ? ?Past Surgical History:  ?Procedure Laterality Date  ? COLONOSCOPY    ? CYSTECTOMY  01/2019  ? scrotum area   ? ESOPHAGEAL MANOMETRY N/A 06/09/2020  ? Procedure: ESOPHAGEAL MANOMETRY (EM);  Surgeon: Lavena Bullion, DO;  Location: [facility];  Service: Gastroenterology;  Laterality:  N/A;  ? ESOPHAGOGASTRODUODENOSCOPY    ? shoulder surgery Right 08/26/2019  ? WISDOM TOOTH EXTRACTION    ? ? ? ? ? ?Home Medications   ? ?Prior to Admission medications   ?Medication Sig Start Date End Date Taking? Authorizing Provider  ?ondansetron (ZOFRAN-ODT) 8 MG disintegrating tablet Take 1 tablet (8 mg total) by mouth every 8 (eight) hours as needed for nausea or vomiting. 12/30/21  Yes Eliezer Lofts, FNP  ?albuterol (VENTOLIN HFA) 108 (90 Base) MCG/ACT inhaler INHALE 2 PUFFS INTO THE LUNGS EVERY 6 HOURS AS NEEDED FOR WHEEZING OR SHORTNESS OF BREATH 10/24/21   Ambs, Kathrine Cords, FNP  ?ALVESCO 160 MCG/ACT inhaler Inhale 2 puffs into the lungs 2 (two) times daily. 12/29/21   Dara Hoyer, FNP  ?AUVI-Q 0.3 MG/0.3ML SOAJ injection SMARTSIG:1 IM Daily 09/06/21   [provider]  ?citalopram (CELEXA) 20 MG tablet TAKE 1 TABLET(20 MG) BY MOUTH DAILY 12/12/21   Burchette, Alinda Sierras, MD  ?levocetirizine (XYZAL) 5 MG tablet Take by mouth.    [provider]  ?montelukast (SINGULAIR) 10 MG tablet Take 1 tablet (10 mg total) by mouth at bedtime. 09/30/21   Dara Hoyer, FNP  ?NURTEC 75 MG TBDP Take 1 tablet by mouth daily as needed. 09/01/21   [provider]  ?RESTASIS 0.05 % ophthalmic emulsion 1 drop 2 (two) times daily. 08/01/21   [provider]  ?sildenafil (VIAGRA) 100 MG tablet Take 0.5-1 tablets (50-100 mg total) by mouth daily as needed for  erectile dysfunction. 12/24/20   Burchette, Alinda Sierras, MD  ?sucralfate (CARAFATE) 1 g tablet Take 1 tablet (1 g total) by mouth 2 (two) times daily. Mix one tablet with 1 tablespoon of warm water to make a slurry and drink. Do this twice a day. 12/28/21   Mansouraty, Telford Nab., MD  ? ? ?Family History ?Family History  ?Problem Relation Age of Onset  ? Hypertension Mother   ? Colon polyps Mother   ?     might be mistakrn  ? Diabetes Father   ?     type II  ? Migraines Sister   ? Colon cancer Neg Hx   ? Esophageal cancer Neg Hx   ? Rectal cancer Neg Hx   ?  Stomach cancer Neg Hx   ? ? ?Social History ?Social History  ? ?Tobacco Use  ? Smoking status: Never  ? Smokeless tobacco: Never  ?Vaping Use  ? Vaping Use: Never used  ?Substance Use Topics  ? Alcohol use: Yes  ?  Alcohol/week: 3.0 standard drinks  ?  Types: 3 Standard drinks or equivalent per week  ?  Comment: every other week. not a big drinker per patient  ? Drug use: Yes  ?  Types: Marijuana  ?  Comment: rare use  ? ? ? ?Allergies   ?Shellfish allergy, Hydrocodone, and Other ? ? ?Review of Systems ?Review of Systems  ?Constitutional:  Positive for appetite change.  ?Gastrointestinal:  Positive for nausea.  ?All other systems reviewed and are negative. ? ? ?Physical Exam ?Triage Vital Signs ?ED Triage Vitals  ?Enc Vitals Group  ?   BP 12/30/21 1304 (!) 147/98  ?   Pulse Rate 12/30/21 1304 65  ?   Resp 12/30/21 1304 18  ?   Temp 12/30/21 1304 98.7 ?F (37.1 ?C)  ?   Temp Source 12/30/21 1304 Oral  ?   SpO2 12/30/21 1304 99 %  ?   Weight 12/30/21 1305 (!) 365 lb (165.6 kg)  ?   Height 12/30/21 1305 '6\' 6"'$  (1.981 m)  ?   Head Circumference --   ?   Peak Flow --   ?   Pain Score 12/30/21 1305 0  ?   Pain Loc --   ?   Pain Edu? --   ?   Excl. in [place]? --   ? ?No data found. ? ?Updated Vital Signs ?BP (!) 147/98 (BP Location: Left Arm)   Pulse 65   Temp 98.7 ?F (37.1 ?C) (Oral)   Resp 18   Ht '6\' 6"'$  (1.981 m)   Wt (!) 365 lb (165.6 kg)   SpO2 99%   BMI 42.18 kg/m?  ? ? ?Physical Exam ?Vitals and nursing note reviewed.  ?Constitutional:   ?   Appearance: Normal appearance. He is obese.  ?HENT:  ?   Head: Normocephalic and atraumatic.  ?   Mouth/Throat:  ?   Mouth: Mucous membranes are moist.  ?   Pharynx: Oropharynx is clear.  ?Eyes:  ?   Extraocular Movements: Extraocular movements intact.  ?   Conjunctiva/sclera: Conjunctivae normal.  ?   Pupils: Pupils are equal, round, and reactive to light.  ?Cardiovascular:  ?   Rate and Rhythm: Normal rate and regular rhythm.  ?   Pulses: Normal pulses.  ?   Heart sounds:  Normal heart sounds.  ?Pulmonary:  ?   Effort: Pulmonary effort is normal.  ?   Breath sounds: Normal breath sounds.  ?Musculoskeletal:  ?  Cervical back: Normal range of motion and neck supple.  ?Skin: ?   General: Skin is warm and dry.  ?Neurological:  ?   General: No focal deficit present.  ?   Mental Status: He is alert and oriented to person, place, and time.  ? ? ? ?UC Treatments / Results  ?Labs ?(all labs ordered are listed, but only abnormal results are displayed) ?Labs Reviewed  ?CBC WITH DIFFERENTIAL/PLATELET  ?COMPLETE METABOLIC PANEL WITH GFR  ?LIPASE  ? ? ?EKG ? ? ?Radiology ?DG Abdomen 1 View ? ?Result Date: 12/30/2021 ?CLINICAL DATA:  Loss of appetite, weight loss. EXAM: ABDOMEN - 1 VIEW COMPARISON:  October 02, 2018. FINDINGS: The bowel gas pattern is normal. No radio-opaque calculi or other significant radiographic abnormality are seen. IMPRESSION: Negative. Electronically Signed   By: Marijo Conception M.D.   On: 12/30/2021 13:44   ? ?Procedures ?Procedures (including critical care time) ? ?Medications Ordered in UC ?Medications - No data to display ? ?Initial Impression / Assessment and Plan / UC Course  ?I have reviewed the triage vital signs and the nursing notes. ? ?Pertinent labs & imaging results that were available during my care of the patient were reviewed by me and considered in my medical decision making (see chart for details). ? ?  ? ?MDM: 1.  Loss of appetite-KUB was negative for acute process, CBC with differential, CMP, lipase ordered; 2.  Nausea-Rx'd Zofran. Advised/informed patient of KUB x-ray results this afternoon.  Advised patient we will follow-up tomorrow, 12/31/2021, early afternoon with lab results.  Advised patient may use Zofran daily or as needed for nausea.  Advised patient to adhere to bland/brat diet gradually returning to normal diet.  Advised patient if symptoms worsen and/or unresolved please follow-up with GI, PCP, or here for further evaluation.  Patient discharged  home, hemodynamically stable. ? ?Final Clinical Impressions(s) / UC Diagnoses  ? ?Final diagnoses:  ?Nausea  ?Loss of appetite  ? ? ? ?Discharge Instructions   ? ?  ?Advised/informed patient of KUB x-ray results this aft

## 2021-12-31 ENCOUNTER — Encounter: Payer: Self-pay | Admitting: Gastroenterology

## 2022-01-01 ENCOUNTER — Telehealth: Payer: Self-pay

## 2022-01-01 ENCOUNTER — Other Ambulatory Visit: Payer: Self-pay | Admitting: Family Medicine

## 2022-01-01 LAB — COMPLETE METABOLIC PANEL WITH GFR
AG Ratio: 1.7 (calc) (ref 1.0–2.5)
ALT: 18 U/L (ref 9–46)
AST: 13 U/L (ref 10–40)
Albumin: 4.6 g/dL (ref 3.6–5.1)
Alkaline phosphatase (APISO): 75 U/L (ref 36–130)
BUN/Creatinine Ratio: 9 (calc) (ref 6–22)
BUN: 13 mg/dL (ref 7–25)
CO2: 23 mmol/L (ref 20–32)
Calcium: 9.2 mg/dL (ref 8.6–10.3)
Chloride: 104 mmol/L (ref 98–110)
Creat: 1.4 mg/dL — ABNORMAL HIGH (ref 0.60–1.24)
Globulin: 2.7 g/dL (calc) (ref 1.9–3.7)
Glucose, Bld: 95 mg/dL (ref 65–99)
Potassium: 4.2 mmol/L (ref 3.5–5.3)
Sodium: 138 mmol/L (ref 135–146)
Total Bilirubin: 1 mg/dL (ref 0.2–1.2)
Total Protein: 7.3 g/dL (ref 6.1–8.1)
eGFR: 71 mL/min/{1.73_m2} (ref >=60)

## 2022-01-01 LAB — CBC WITH DIFFERENTIAL/PLATELET
Absolute Monocytes: 540 cells/uL (ref 200–950)
Basophils Absolute: 58 cells/uL (ref 0–200)
Basophils Relative: 0.7 %
Eosinophils Absolute: 249 cells/uL (ref 15–500)
Eosinophils Relative: 3 %
HCT: 45.6 % (ref 38.5–50.0)
Hemoglobin: 15.4 g/dL (ref 13.2–17.1)
Lymphs Abs: 1809 cells/uL (ref 850–3900)
MCH: 29.7 pg (ref 27.0–33.0)
MCHC: 33.8 g/dL (ref 32.0–36.0)
MCV: 87.9 fL (ref 80.0–100.0)
MPV: 11.3 fL (ref 7.5–12.5)
Monocytes Relative: 6.5 %
Neutro Abs: 5644 cells/uL (ref 1500–7800)
Neutrophils Relative %: 68 %
Platelets: 234 10*3/uL (ref 140–400)
RBC: 5.19 10*6/uL (ref 4.20–5.80)
RDW: 12.3 % (ref 11.0–15.0)
Total Lymphocyte: 21.8 %
WBC: 8.3 10*3/uL (ref 3.8–10.8)

## 2022-01-01 LAB — LIPASE: Lipase: 208 U/L — ABNORMAL HIGH (ref 7–60)

## 2022-01-01 NOTE — Telephone Encounter (Signed)
On 4/8 pt was notified of lab results from previous visit. Pt was instructed to follow up in the ER for further evaluation if symptoms weren't improving. Pt was also instructed to follow up with GI specialist as well. Pt states that he will follow up with GI on 4/10. LM ?

## 2022-01-02 ENCOUNTER — Encounter: Payer: Self-pay | Admitting: Gastroenterology

## 2022-01-02 ENCOUNTER — Other Ambulatory Visit: Payer: Self-pay

## 2022-01-02 DIAGNOSIS — R630 Anorexia: Secondary | ICD-10-CM

## 2022-01-02 DIAGNOSIS — R110 Nausea: Secondary | ICD-10-CM

## 2022-01-02 NOTE — Telephone Encounter (Signed)
See 4/8 mychart message.  ?

## 2022-01-02 NOTE — Telephone Encounter (Signed)
Pt sent a second mychart message over the weekend that stated: ?Bradley [name]  [facility] Clinical Pool (supporting Cirigliano, Vito V, DO) 3 days ago  ? ?"Still not feeling well and my appetite hasn?t returned. I?ve had a low grade fever the past two days of 99.5-99.8. I followed up at [facility] and I was advised to let you know I think they are taking labs." ? ? ?Called pt and he states he is feeling a little better today and started to eat normally yesterday. Scheduled pt for soonest appt available between Dr. Bryan [name] and [place]. Pt is scheduled with Dr. Bryan [name] on 01/05/22 at 10 am. Pt wants to get repeat labs to recheck lipase. Let pt know I would send message to covering doctor.  ? ?Dr. Lyndel Safe as DOD please advise.  ?

## 2022-01-02 NOTE — Telephone Encounter (Signed)
Pt called back again. Let pt know that message was sent to covering provider and we will call him back once message is reviewed. Pt verbalized understanding.  ?

## 2022-01-02 NOTE — Telephone Encounter (Signed)
Pt called back to check if he could get repeat lipase lab. Let pt know that covering provider has not had time to review his message yet but once message is reviewed we will call him back.  ?

## 2022-01-02 NOTE — Telephone Encounter (Signed)
Lets get ?-CBC with diff, CMP, amylase and lipase ?-He has appointment coming up in GI clinic ? ?RG ?

## 2022-01-02 NOTE — Telephone Encounter (Signed)
Spoke with pt and gave pt recommendations. Pt verbalized understanding. Pt requested to get labs done at [facility] office. Lab orders placed.  ?

## 2022-01-03 ENCOUNTER — Other Ambulatory Visit (INDEPENDENT_AMBULATORY_CARE_PROVIDER_SITE_OTHER): Payer: 59

## 2022-01-03 ENCOUNTER — Telehealth: Payer: Self-pay

## 2022-01-03 ENCOUNTER — Encounter: Payer: Self-pay | Admitting: *Deleted

## 2022-01-03 DIAGNOSIS — R110 Nausea: Secondary | ICD-10-CM | POA: Diagnosis not present

## 2022-01-03 DIAGNOSIS — R630 Anorexia: Secondary | ICD-10-CM | POA: Diagnosis not present

## 2022-01-03 LAB — COMPREHENSIVE METABOLIC PANEL
ALT: 17 U/L (ref 0–53)
AST: 13 U/L (ref 0–37)
Albumin: 4.4 g/dL (ref 3.5–5.2)
Alkaline Phosphatase: 78 U/L (ref 39–117)
BUN: 11 mg/dL (ref 6–23)
CO2: 26 mEq/L (ref 19–32)
Calcium: 9 mg/dL (ref 8.4–10.5)
Chloride: 105 mEq/L (ref 96–112)
Creatinine, Ser: 1.08 mg/dL (ref 0.40–1.50)
GFR: 94.7 mL/min (ref >60.00)
Glucose, Bld: 99 mg/dL (ref 70–99)
Potassium: 4.1 mEq/L (ref 3.5–5.1)
Sodium: 139 mEq/L (ref 135–145)
Total Bilirubin: 0.5 mg/dL (ref 0.2–1.2)
Total Protein: 6.8 g/dL (ref 6.0–8.3)

## 2022-01-03 LAB — CBC WITH DIFFERENTIAL/PLATELET
Basophils Absolute: 0.1 10*3/uL (ref 0.0–0.1)
Basophils Relative: 1 % (ref 0.0–3.0)
Eosinophils Absolute: 0.3 10*3/uL (ref 0.0–0.7)
Eosinophils Relative: 4.7 % (ref 0.0–5.0)
HCT: 42.3 % (ref 39.0–52.0)
Hemoglobin: 14.4 g/dL (ref 13.0–17.0)
Lymphocytes Relative: 26.9 % (ref 12.0–46.0)
Lymphs Abs: 1.9 10*3/uL (ref 0.7–4.0)
MCHC: 34.1 g/dL (ref 30.0–36.0)
MCV: 88.6 fl (ref 78.0–100.0)
Monocytes Absolute: 0.6 10*3/uL (ref 0.1–1.0)
Monocytes Relative: 7.9 % (ref 3.0–12.0)
Neutro Abs: 4.2 10*3/uL (ref 1.4–7.7)
Neutrophils Relative %: 59.5 % (ref 43.0–77.0)
Platelets: 223 10*3/uL (ref 150.0–400.0)
RBC: 4.78 Mil/uL (ref 4.22–5.81)
RDW: 13 % (ref 11.5–15.5)
WBC: 7.1 10*3/uL (ref 4.0–10.5)

## 2022-01-03 LAB — AMYLASE: Amylase: 137 U/L — ABNORMAL HIGH (ref 27–131)

## 2022-01-03 LAB — LIPASE: Lipase: 260 U/L — ABNORMAL HIGH (ref 11.0–59.0)

## 2022-01-03 NOTE — Telephone Encounter (Signed)
Pt called extremely concerned about lab values and wanted to know what Doctor said about labs. Pt also requested to be seen tomorrow. Scheduled pt for f/u with Vicie Mutters, PA at 11:00 am. Let pt know if he has severe symptoms before tomorrow he should go to ER.  ?

## 2022-01-04 ENCOUNTER — Encounter: Payer: Self-pay | Admitting: Physician Assistant

## 2022-01-04 ENCOUNTER — Ambulatory Visit: Payer: 59 | Admitting: Physician Assistant

## 2022-01-04 ENCOUNTER — Other Ambulatory Visit (INDEPENDENT_AMBULATORY_CARE_PROVIDER_SITE_OTHER): Payer: 59

## 2022-01-04 VITALS — BP 128/86 | HR 79 | Ht 78.0 in | Wt 372.6 lb

## 2022-01-04 DIAGNOSIS — R112 Nausea with vomiting, unspecified: Secondary | ICD-10-CM

## 2022-01-04 DIAGNOSIS — E785 Hyperlipidemia, unspecified: Secondary | ICD-10-CM | POA: Diagnosis not present

## 2022-01-04 DIAGNOSIS — R748 Abnormal levels of other serum enzymes: Secondary | ICD-10-CM | POA: Diagnosis not present

## 2022-01-04 DIAGNOSIS — K648 Other hemorrhoids: Secondary | ICD-10-CM | POA: Diagnosis not present

## 2022-01-04 DIAGNOSIS — R6881 Early satiety: Secondary | ICD-10-CM

## 2022-01-04 DIAGNOSIS — K602 Anal fissure, unspecified: Secondary | ICD-10-CM

## 2022-01-04 LAB — ALPHA-GAL PANEL
Allergen Lamb IgE: 0.54 kU/L — AB
Beef IgE: 0.5 kU/L — AB
IgE (Immunoglobulin E), Serum: 134 IU/mL (ref 6–495)
O215-IgE Alpha-Gal: 0.85 kU/L — AB
Pork IgE: 0.34 kU/L — AB

## 2022-01-04 LAB — ALLERGEN, SHRIMP F24: Shrimp IgE: 0.52 kU/L — AB

## 2022-01-04 LAB — TRIGLYCERIDES: Triglycerides: 82 mg/dL (ref 0.0–149.0)

## 2022-01-04 LAB — F037-IGE MUSSEL: F037-IgE Mussel: <0.1 kU/L

## 2022-01-04 LAB — ALLERGEN, CLAM, F207: Clam IgE: 0.29 kU/L — AB

## 2022-01-04 MED ORDER — AMBULATORY NON FORMULARY MEDICATION: 0 refills | Status: DC

## 2022-01-04 NOTE — Patient Instructions (Signed)
Anal Fissure, Adult ? ?Diltiazem/lidocaine 3 x daily for 2 months sent to compound pharmacy  ? ?Sent this medication to a compound pharmacy: ? ?[facility] ?Address: [address], [place], [place] [address]  ?[phone] ? ?Please DO NOT go directly from our office to pick up this medication! Give the pharmacy 1 day to process the prescription. Extra time is required for them to compound your medication. ? ?Follow up should symptoms persist for secondary evaluation and possible surgical referral for repair. ?An anal fissure is a small tear or crack in the skin around the anus. Bleeding from a fissure usually stops on its own within a few minutes. However, bleeding will often occur again with each bowel movement until the crack heals. ?CAUSES ?This condition may be caused by: ?Passing large, hard stool (feces). ?Frequent diarrhea. ?Constipation. ?Inflammatory bowel disease (Crohn disease or ulcerative colitis). ?Infections. ?Anal sex. ?SYMPTOMS ?Symptoms of this condition include: ?Bleeding from the rectum. ?Small amounts of blood seen on your stool, on toilet paper, or in the toilet after a bowel movement. ?Painful bowel movements. ?Itching or irritation around the anus. ?DIAGNOSIS  ?A health care provider may diagnose this condition by closely examining the anal area. An anal fissure can usually be seen with careful inspection. In some cases, a rectal exam may be performed, or a short tube (anoscope) may be used to examine the anal canal. ?TREATMENT ?Treatment for this condition may include: ?Taking steps to avoid constipation. This may include making changes to your diet, such as increasing your intake of fiber or fluid. ?Taking fiber supplements. These supplements can soften your stool to help make bowel movements easier. Your health care provider may also prescribe a stool softener if your stool is often hard. ?Taking sitz baths. This may help to heal the tear. ?Using medicated creams or  ointments. These may be prescribed to lessen discomfort. ?HOME CARE INSTRUCTIONS ?Eating and Drinking ?Avoid foods that may be constipating, such as bananas and dairy products. ?Drink enough fluid to keep your urine clear or pale yellow. ?Maintain a diet that is high in fruits, whole grains, and vegetables. ?General Instructions ?Keep the anal area as clean and dry as possible. ?Take sitz baths as told by your health care provider. Do not use soap in the sitz baths. ?Take over-the-counter and prescription medicines only as told by your health care provider. ?Use creams or ointments only as told by your health care provider. ?Keep all follow-up visits as told by your health care provider. This is important. ?SEEK MEDICAL CARE IF: ?You have more bleeding. ?You have a fever. ?You have diarrhea that is mixed with blood. ?You continue to have pain. ?Your problem is getting worse rather than better. ?  ?This information is not intended to replace advice given to you by your health care provider. Make sure you discuss any questions you have with your health care provider. ?  ?Document Released: 09/11/2005 Document Revised: 06/02/2015 Document Reviewed: 12/07/2014 ?Elsevier Interactive Patient Education ?2016 [place]. ? ? ?________________________________________________________________________  ? ? ?You have been scheduled for a CT scan of the abdomen and pelvis at [facility], 1st floor Radiology. You are scheduled on 01/09/2022  at 5:00pm. You should arrive 15 minutes prior to your appointment time for registration.  Please pick up 2 bottles of contrast from [place] at least 3 days prior to your scan. The solution may taste better if refrigerated, but do NOT add ice or any other liquid to this solution.  Shake well before drinking.  ? ?Please follow the written instructions below on the day of your exam:  ? ?1) Do not eat anything after 1pm (4 hours prior to your test)  ? ?2) Drink 1 bottle of contrast @  3:00pm (2 hours prior to your exam)  Remember to shake well before drinking and do NOT pour over ice. ?    Drink 1 bottle of contrast @ 4:00pm (1 hour prior to your exam)  ? ?You may take any medications as prescribed with a small amount of water, if necessary. If you take any of the following medications: METFORMIN, GLUCOPHAGE, GLUCOVANCE, AVANDAMET, RIOMET, FORTAMET, ACTOPLUS MET, JANUMET, [place] or METAGLIP, you MAY be asked to HOLD this medication 48 hours AFTER the exam.  ? ?The purpose of you drinking the oral contrast is to aid in the visualization of your intestinal tract. The contrast solution may cause some diarrhea. Depending on your individual set of symptoms, you may also receive an intravenous injection of x-ray contrast/dye. Plan on being at [facility] for 45 minutes or longer, depending on the type of exam you are having performed.  ? ?If you have any questions regarding your exam or if you need to reschedule, you may call [facility] at [phone] between the hours of 8:00 am and 5:00 pm, Monday-Friday.  ? ?Due to recent changes in healthcare laws, you may see the results of your imaging and laboratory studies on MyChart before your provider has had a chance to review them.  We understand that in some cases there may be results that are confusing or concerning to you. Not all laboratory results come back in the same time frame and the provider may be waiting for multiple results in order to interpret others.  Please give [place] 48 hours in order for your provider to thoroughly review all the results before contacting the office for clarification of your results.   ? ?I appreciate the  opportunity to care for you ? ?Thank You  ? ?Amanda Collier,PA-C  ?  ? ?

## 2022-01-04 NOTE — Telephone Encounter (Signed)
DOD FU ? ?Patient of Dr. Gerrit Heck. ? ?Pt with elevated amylase/lipase, normal LFTs ?Has appointment with Estill Bamberg today ? ?Likely would need CT pancreatic protocol especially if he is symptomatic ? ?Estill Bamberg, I am available to discuss if you need anything until Dr. Bryan Lemma is back ? ?RG ?

## 2022-01-04 NOTE — Progress Notes (Signed)
? ? ?01/04/2022 ?Bradley [name] ?[id number] ?1995-12-03 ? ? ?ASSESSMENT AND PLAN:  ? ?Nausea and vomiting, unspecified vomiting type with early satiety, weight loss and elevated amylase and lipase.  ?-     CT PANCREAS ABDOMEN W WO CONTRAST; Future ?Has had very minimally elevated in the past ?No ETOH, no family history ?Previous normal CT scans int he past, normal pancrease, recent normal HIDA.  ?LFTs are unremarkable. ?Did have some weight loss with not eating but his appetite is better and this is resolved. ?We will get triglycerides as this has not been evaluated as a potential cause for pancreatitis. 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 ?Will get patient to talk with PCP to stop singulair as this is associated with pancreatitis risk ?Pending CT scan if this continues can consider autoimmune work-up, MRCP. ? ?Anal fissure ?-     AMBULATORY NON FORMULARY MEDICATION; Diltiazem gel 2% with lidocaine  ?Apply a pea sized amount into your rectum three times daily as needed ?Dispense 30 GM zero refill ?On exam anal fissure posterior ?Given information follow-up if not better ? ?Internal hemorrhoids ?Appreciated, will given information, add fiber.  ?Follow-up if not better. ? ?History of Present Illness:  ?26 y.o. male  with a past medical history of nephrolithiasis, asthma, anxiety, headaches, morbid obesity, GERD, IBS mixed type, colon polyps and others listed below, known to Dr. Bryan Lemma returns to clinic today for evaluation of nausea, decreased appetite, elevated lipase. ?12/13/2021 seen in the office for hematemesis, thought to be benign etiology scheduled for EGD and follow-up colonoscopy for adenomatous polyps ?12/26/2021 colonoscopy and upper endoscopy  ?Colonoscopy one 4 mm polyp sigmoid colon, diverticulosis, erythematous mucosa sigmoid colon nonbleeding internal hemorrhoids recall 5 years. ?Endoscopy showed normal esophagus,  normal stomach normal duodenum. ?Pathology showed mild chronic gastritis negative for H. pylori, no celiac disease.  Benign colonic mucosa.  Hyperplastic polyp ?10/16/2019 CT abdomen pelvis with contrast for abdominal pain showed normal liver, no gallstones or gallbladder wall thickening or biliary dilatation, unremarkable pancreas, spleen normal, distal esophagus, stomach small bowel and colon normal.  Normal appendix.  Small left upper pole renal mass consistent with cyst. ? ?Patient states since EGD: Was not feeling well, had low-grade temperature.  Having mild nausea and decreased appetite.   ?Was brushing his teeth and had gag reflex with episode of emesis. ?12/28/2021 video visit with primary care advised to start Carafate. No alarming symptoms.   ?12/30/2021  urgent care and given Zofran.   ?CBC showed no leukocytosis, no anemia BUN 13, creatinine 1.4 up from baseline, normal liver function  ?AST 13, ALT 18, alk phos 75, total bili 1.0 lipase 208 slightly elevated compared to baseline x-ray showed normal gas pattern no calculi. ?Repeat labs 01/03/22 showed lipase 260 and amylase 137.  ?Presents for follow up.  ?Had unremarkable CT 10/16/2019. ? ?He has ETOH once a week, smokes marijuana.  ?Celexa, singulair and xyzal and restasis for dry eyes, no dry mouth.  ?Singulair does have pancreatitis associated with it, recently started back.  ?Normal HIDA scan 07/2020.  ?No family history of pancreatitis, mother and sister with GB removal.  ? ?Had lower back pain, chills, then nausea and decreased appetite day after. ?States yesterday things have improved, able to eat meals.  ?Patient states had elevated lipase 4 years ago, went back down, then had another elevation 2 years ago and most recent.   ?The patient denies any issues  with jaundice, scleral icterus, generalized pruritus, darkened/amber urine, clay-colored stools, LE edema, hematemesis, coffee-ground emesis, abdominal distention, confusion. ? ?He has itching,  burning and rectal pain. Very rare BRB blood. Had internal hemorrhoids colonoscopy.  ?Has had some weight loss with decreased appetite.  ?Wt Readings from Last 3 Encounters:  ?01/04/22 (!) 372 lb 9.6 oz (169 kg)  ?12/30/21 (!) 365 lb (165.6 kg)  ?12/26/21 (!) 381 lb (172.8 kg)  ? ? ? ?Previous GI history: ? ?DG Abdomen 1 View ? ?Result Date: 12/30/2021 ?CLINICAL DATA:  Loss of appetite, weight loss. EXAM: ABDOMEN - 1 VIEW COMPARISON:  October 02, 2018. FINDINGS: The bowel gas pattern is normal. No radio-opaque calculi or other significant radiographic abnormality are seen. IMPRESSION: Negative. Electronically Signed   By: Marijo Conception M.D.   On: 12/30/2021 13:44   ? ?Current Medications:  ? ? ?Current Outpatient Medications (Cardiovascular):  ?  AUVI-Q 0.3 MG/0.3ML SOAJ injection, SMARTSIG:1 IM Daily ?  sildenafil (VIAGRA) 100 MG tablet, TAKE 1/2 TO 1 TABLET(50 TO 100 MG) BY MOUTH DAILY AS NEEDED FOR ERECTILE DYSFUNCTION ? ?Current Outpatient Medications (Respiratory):  ?  albuterol (VENTOLIN HFA) 108 (90 Base) MCG/ACT inhaler, INHALE 2 PUFFS INTO THE LUNGS EVERY 6 HOURS AS NEEDED FOR WHEEZING OR SHORTNESS OF BREATH ?  ALVESCO 160 MCG/ACT inhaler, Inhale 2 puffs into the lungs 2 (two) times daily. ?  levocetirizine (XYZAL) 5 MG tablet, Take by mouth. ?  montelukast (SINGULAIR) 10 MG tablet, TAKE 1 TABLET(10 MG) BY MOUTH AT BEDTIME ? ?Current Outpatient Medications (Analgesics):  ?  NURTEC 75 MG TBDP, Take 1 tablet by mouth daily as needed. ? ? ?Current Outpatient Medications (Other):  ?  AMBULATORY NON FORMULARY MEDICATION, Diltiazem gel 2% with lidocaine  Apply a pea sized amount into your rectum three times daily as needed Dispense 30 GM zero refill ?  citalopram (CELEXA) 20 MG tablet, TAKE 1 TABLET(20 MG) BY MOUTH DAILY ?  ondansetron (ZOFRAN-ODT) 8 MG disintegrating tablet, Take 1 tablet (8 mg total) by mouth every 8 (eight) hours as needed for nausea or vomiting. ?  RESTASIS 0.05 % ophthalmic emulsion, 1 drop 2  (two) times daily. ?  sucralfate (CARAFATE) 1 g tablet, Take 1 tablet (1 g total) by mouth 2 (two) times daily. Mix one tablet with 1 tablespoon of warm water to make a slurry and drink. Do this twice a day. ? ?Surgical History:  ?He  has a past surgical history that includes Wisdom tooth extraction; Cystectomy (01/2019); shoulder surgery (Right, 08/26/2019); Esophageal manometry (N/A, 06/09/2020); Colonoscopy; and Esophagogastroduodenoscopy. ?Family History:  ?His family history includes Colon polyps in his mother; Diabetes in his father; Hypertension in his mother; Migraines in his sister. ?Social History:  ? reports that he has never smoked. He has never used smokeless tobacco. He reports current alcohol use of about 3.0 standard drinks per week. He reports current drug use. Drug: Marijuana. ? ?Current Medications, Allergies, Past Medical History, Past Surgical History, Family History and Social History were reviewed in [employer] record. ? ?Physical Exam: ?BP 128/86   Pulse 79   Ht $R'6\' 6"'Cq$  (1.981 m)   Wt (!) 372 lb 9.6 oz (169 kg)   SpO2 97%   BMI 43.06 kg/m?  ?General:   Pleasant, well developed male in no acute distress ?Heart:  Regular rate and rhythm; no murmurs ?Pulm: Clear anteriorly; no wheezing ?Abdomen:  Soft, Obese AB, Active bowel sounds. mild tenderness in the LUQ and in the lower  abdomen. Without guarding and Without rebound, No organomegaly appreciated. ?Rectal: Posterior tender anal fissure, normal rectal tone, appreciated internal hemorrhoids, non-tender tender, no masses, recent colon, did not do hemoccult ?Extremities:  without  edema. ?Neurologic:  Alert and  oriented x4;  No focal deficits.  ?Psych:  Cooperative. Normal mood and affect. ? ? ?Vladimir Crofts, PA-C ?01/04/22 ?

## 2022-01-04 NOTE — Progress Notes (Signed)
Please let this patient know that his lab work results are in.  He continues to have elevated levels to alpha gal, shrimp, and clam, however, these levels are decreased from his last lab work.  In the meantime, he should continue to avoid shrimp, clam, and mammal meat and have access to an epinephrine autoinjector set.  We can redraw labs in 6 months or a year.  Thank you

## 2022-01-05 ENCOUNTER — Ambulatory Visit: Payer: 59 | Admitting: Gastroenterology

## 2022-01-05 NOTE — Progress Notes (Signed)
Agree with the assessment and plan as outlined by Amanda Collier, PA-C. ? ?Azaria Stegman, DO, FACG ? ?

## 2022-01-09 ENCOUNTER — Telehealth: Payer: Self-pay | Admitting: Physician Assistant

## 2022-01-09 ENCOUNTER — Ambulatory Visit (HOSPITAL_COMMUNITY): Payer: 59

## 2022-01-09 DIAGNOSIS — R634 Abnormal weight loss: Secondary | ICD-10-CM

## 2022-01-09 DIAGNOSIS — R112 Nausea with vomiting, unspecified: Secondary | ICD-10-CM

## 2022-01-09 DIAGNOSIS — K859 Acute pancreatitis without necrosis or infection, unspecified: Secondary | ICD-10-CM

## 2022-01-09 NOTE — Telephone Encounter (Signed)
Case was denied by [facility] due to the following:  ?Results of a prior ultrasound must have failed to find the source of your problem ?Two sets of pictures w or without dye are not needed to show the dr the details that are need to treat you ?One picture study taken w dye or without dye is sufficient  ? ?Please advise if a peer to peer needs to be scheduled and if so date/time? ?

## 2022-01-10 ENCOUNTER — Encounter: Payer: Self-pay | Admitting: Gastroenterology

## 2022-01-10 NOTE — Telephone Encounter (Signed)
Patient is wanting to know if you have heard back. Did you see Bradley [name] message?  Patient has sent an advise request  ?

## 2022-01-11 ENCOUNTER — Encounter: Payer: Self-pay | Admitting: Family Medicine

## 2022-01-11 ENCOUNTER — Telehealth (INDEPENDENT_AMBULATORY_CARE_PROVIDER_SITE_OTHER): Payer: 59 | Admitting: Family Medicine

## 2022-01-11 VITALS — Ht 78.0 in | Wt 372.6 lb

## 2022-01-11 DIAGNOSIS — J309 Allergic rhinitis, unspecified: Secondary | ICD-10-CM | POA: Diagnosis not present

## 2022-01-11 DIAGNOSIS — R748 Abnormal levels of other serum enzymes: Secondary | ICD-10-CM

## 2022-01-11 NOTE — Telephone Encounter (Signed)
[place] Contrast has been approved Auth# [id number] 01/11/2022-02/25/2022 ?

## 2022-01-11 NOTE — Progress Notes (Signed)
Patient ID: Bradley [name], male   DOB: 03/31/1996, 26 y.o.   MRN: [id number] ? ? ?This visit type was conducted due to national recommendations for restrictions regarding the COVID-19 pandemic in an effort to limit this patient's exposure and mitigate transmission in our community.  ? ?Virtual Visit via Telephone Note ? ?I connected with Bradley [name] on 01/11/22 at  5:15 PM EDT by telephone and verified that I am speaking with the correct person using two identifiers. ?  ?I discussed the limitations, risks, security and privacy concerns of performing an evaluation and management service by telephone and the availability of in person appointments. I also discussed with the patient that there may be a patient responsible charge related to this service. The patient expressed understanding and agreed to proceed. ? ?Location patient: home ?Location provider: work or home office ?Participants present for the call: patient, provider ?Patient did not have a visit in the prior 7 days to address this/these issue(s). ? ? ?History of Present Illness: ? ?Bradley [name] called to discuss recent elevated lipase levels.  He had recent EGD to evaluate for some recurrent vomiting.  Symptoms stable at this time.  Appetite and weight stable.  No abdominal pain.  He had recent lipase elevated 208 and subsequent reading 260.  Pending CT abdomen to further assess.  He spoke with GI and they did mention the fact that Singulair can be associated with pancreatitis.  His recent LFTs were normal. ? ?He takes Singulair for allergic rhinitis and also has history of asthma.  Also takes Xyzal.  Held the [employer] about a week ago.  He does think his allergy symptoms are slightly worse.  Currently not using any nasal steroids. ? ?Past Medical History:  ?Diagnosis Date  ? Allergy to alpha-gal   ? Anxiety   ? Chronic headaches   ? Clavicular fracture   ? Diverticulosis   ? History of chickenpox   ? Mild intermittent asthma 06/07/2018  ? Shingles   ? 26 years  old.  ? Tubular adenoma of colon   ? Urticaria 05/18/2020  ? ?Past Surgical History:  ?Procedure Laterality Date  ? COLONOSCOPY    ? CYSTECTOMY  01/2019  ? scrotum area   ? ESOPHAGEAL MANOMETRY N/A 06/09/2020  ? Procedure: ESOPHAGEAL MANOMETRY (EM);  Surgeon: Lavena Bullion, DO;  Location: [facility];  Service: Gastroenterology;  Laterality: N/A;  ? ESOPHAGOGASTRODUODENOSCOPY    ? shoulder surgery Right 08/26/2019  ? WISDOM TOOTH EXTRACTION    ? ? reports that he has never smoked. He has never used smokeless tobacco. He reports current alcohol use of about 3.0 standard drinks per week. He reports current drug use. Drug: Marijuana. ?family history includes Colon polyps in his mother; Diabetes in his father; Hypertension in his mother; Migraines in his sister. ?Allergies  ?Allergen Reactions  ? Shellfish Allergy Anaphylaxis  ? Hydrocodone Itching  ?  Pt states he feels itchy after taking, but it doesn't preclude him taking hydrocodone if needed ?About 2-3 years ago.  It was the first time he took hydrocodone.  ? Other Diarrhea  ?  Slightly allergic to red meat since tick bite.Marland KitchenMarland KitchenNot anaphylaxis, but GI sensitivity.  ? ? ?  ?Observations/Objective: ?Patient sounds cheerful and well on the phone. ?I do not appreciate any SOB. ?Speech and thought processing are grossly intact. ?Patient reported vitals: ? ?Assessment and Plan: ? ?Patient with elevated lipase of uncertain etiology and uncertain significance.  Does not have typical pancreatitis type symptoms.  He has  had some recent nausea but no abdominal pain.  Nausea symptoms improved at this time.  We did mention the fact that elevated lipase is very nonspecific and can have multitude of causes even apart from pancreatitis.  There is a possible association with Singulair and pancreatitis and agree with stopping Singulair at this time ? ?-We did suggest that he try to add Flonase or Nasacort to his Xyzal for allergy symptoms ?-Continue work-up as recommended by GI to  evaluate his elevated lipase.  He apparently has CT scan scheduled for next Monday. ?-Follow-up immediately for any unexplained fever, recurrent vomiting, upper abdominal pain, etc. ? ? ? ?Follow Up Instructions: ? ? ?99441 5-10 ?99442 11-20 ?99443 21-30 ?I did not refer this patient for an OV in the next 24 hours for this/these issue(s). ? ?I discussed the assessment and treatment plan with the patient. The patient was provided an opportunity to ask questions and all were answered. The patient agreed with the plan and demonstrated an understanding of the instructions. ?  ?The patient was advised to call back or seek an in-person evaluation if the symptoms worsen or if the condition fails to improve as anticipated. ? ?I provided 14 minutes of non-face-to-face time during this encounter. ? ? ?Carolann Littler, MD  ? ?

## 2022-01-11 NOTE — Telephone Encounter (Signed)
Patient calling wanting to know status of ct scan precert  ?Advised that it was still pending but then I saw Joey's message. ?

## 2022-01-11 NOTE — Telephone Encounter (Signed)
It's not, a new referral needs to be placed for [id number] and it needs to be rescheduled as a CT Abd W Contrast only. ?

## 2022-01-11 NOTE — Telephone Encounter (Signed)
Amanda Please advise. There is a new order in for CT Pancreas This has not been approved but Joey got the other CT approved. Can you please get with him with what orders you actually want... Thank You  ?

## 2022-01-11 NOTE — Telephone Encounter (Signed)
It looks like it was already rescheduled but im not sure its the same thing.  ?

## 2022-01-11 NOTE — Telephone Encounter (Signed)
Yes, can you please place a new order for this ?

## 2022-01-12 NOTE — Telephone Encounter (Signed)
Bradley [name] is willing to do a Peer to Peer if needed     Are the orders correct or does the order for CT abdomen pelvis with pancreatic protocol need to be entered different   Sorry  this is confusing  ?

## 2022-01-12 NOTE — Telephone Encounter (Signed)
A peer to peer will have to be done, what date and time so I can schedule this ?

## 2022-01-12 NOTE — Telephone Encounter (Signed)
Recurrent pancreatitis ?Has had AB [place], CT AB and pelvis with contrast ?Prefer CT pancreatitic protocol, can do peer to peer for this if needed.  ?If they won't approve can get just CT AB and pelvis with contrast.  ?Thanks ?

## 2022-01-13 NOTE — Telephone Encounter (Signed)
CT AB with contrast  was approved ?Spoke with Sheree ?[id number] ? ?Good from 04/19 to 06/3 ? ?Will talk with Dr. Marlynn Perking to see if he wants to get the CT AB with contrast versus MRCP since the CT pancrease protocol was not approved.  ? ?Nausea and vomiting, unspecified vomiting type with early satiety, weight loss and elevated amylase and lipase.  ?Has had very minimally elevated in the past ?No ETOH, no family history ?Previous normal CT scans int he past, normal pancrease, recent normal HIDA.  ?LFTs are unremarkable. ?Did have some weight loss with not eating but his appetite is better and this is resolved. ?We will get triglycerides as this has not been evaluated as a potential cause for pancreatitis. 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 ?Will get patient to talk with PCP to stop singulair as this is associated with pancreatitis risk ?

## 2022-01-13 NOTE — Telephone Encounter (Signed)
Since the indication for additional imaging is recurrent pancreatitis, I think your initial inclination is correct with CT Pancreas protocol. Since that is not approved, MRCP/MRI Pancreas protocol is appropriate. If not approved, I am very happy to discuss via peer to peer as well.  ?

## 2022-01-13 NOTE — Telephone Encounter (Signed)
Yes, the number is [phone] option 1 ?

## 2022-01-13 NOTE — Telephone Encounter (Signed)
Peer to peer has been scheduled for today Friday 01/13/22'@1'$ :15pm  ? ?Dr Kennon Portela will be calling Vicie Mutters @ [phone] ? ?Case# [id number] ?

## 2022-01-13 NOTE — Telephone Encounter (Signed)
I was in my office.  ?Never received a call.  ?Can I please have the phone number to call for peer to peer?  ?Thanks! ? ?

## 2022-01-13 NOTE — Telephone Encounter (Signed)
Yes the number is [phone] option 1 ? ?Case# [id number] and then press # ?[facility] 08/24/95 and then press # ?

## 2022-01-16 ENCOUNTER — Ambulatory Visit (HOSPITAL_COMMUNITY): Admission: RE | Admit: 2022-01-16 | Payer: 59 | Source: Ambulatory Visit

## 2022-01-16 NOTE — Telephone Encounter (Signed)
Schedulers have been notified for MRI/MRCP. Patient is aware that schedulers should reach out to him this week, but if he has not heard from them by the end of the week he can call the number provided to him.  ?

## 2022-01-16 NOTE — Addendum Note (Signed)
Addended by: Vicie Mutters on: 01/16/2022 08:09 AM ? ? Modules accepted: Orders ? ?

## 2022-01-16 NOTE — Telephone Encounter (Signed)
We will resubmit for MRCP pancreas protocol for recurrent pancreatitis, nausea vomiting and weight loss.  If needed please contact me for a peer to peer which I will gladly call myself to set up. ?I did send patient a MyChart message and explaining the situation if all else fails we do have approval for the CT abdomen pelvis with contrast only until June however he has had this prior and with recurrent pancreatitis really needs a more thorough evaluation. ?

## 2022-01-19 ENCOUNTER — Other Ambulatory Visit: Payer: Self-pay | Admitting: Physician Assistant

## 2022-01-19 ENCOUNTER — Ambulatory Visit (HOSPITAL_COMMUNITY)
Admission: RE | Admit: 2022-01-19 | Discharge: 2022-01-19 | Disposition: A | Discharge status: Home / Self Care | Payer: 59 | Source: Ambulatory Visit | Attending: Physician Assistant | Admitting: Physician Assistant

## 2022-01-19 ENCOUNTER — Encounter: Payer: Self-pay | Admitting: Gastroenterology

## 2022-01-19 DIAGNOSIS — R634 Abnormal weight loss: Secondary | ICD-10-CM | POA: Insufficient documentation

## 2022-01-19 DIAGNOSIS — K859 Acute pancreatitis without necrosis or infection, unspecified: Secondary | ICD-10-CM | POA: Diagnosis present

## 2022-01-19 DIAGNOSIS — R112 Nausea with vomiting, unspecified: Secondary | ICD-10-CM

## 2022-01-19 MED ORDER — GADOBUTROL 1 MMOL/ML IV SOLN
10.0000 mL | Freq: Once | INTRAVENOUS | Status: AC | PRN
  Administered 2022-01-19: 10 mL via INTRAVENOUS

## 2022-01-27 ENCOUNTER — Ambulatory Visit: Payer: 59 | Admitting: Family Medicine

## 2022-01-27 ENCOUNTER — Encounter: Payer: Self-pay | Admitting: Family Medicine

## 2022-01-27 VITALS — BP 120/88 | HR 76 | Temp 97.9°F | Ht 78.0 in | Wt 380.2 lb

## 2022-01-27 DIAGNOSIS — H9201 Otalgia, right ear: Secondary | ICD-10-CM | POA: Diagnosis not present

## 2022-01-27 NOTE — Progress Notes (Signed)
? ?Established Patient Office Visit ? ?Subjective   ?Patient ID: Bradley [name], male    DOB: [birth year]  Age: 26 y.o. MRN: [id number] ? ?Chief Complaint  ?Patient presents with  ? Ear Pain  ?  Patient complains of right ear pain, x3 days   ? ? ?HPI ? ? ?Seen with onset this past Monday of right ear pain.  He used some type of curette with a light on the tip that he had gotten from [employer] to try to remove wax.  Did not get any thing really out.  No bleeding.  Had some pain in the ear canal and ear since then.  No left-sided symptoms.  Does have some mild allergies this time a year.  Has taken Singulair in the past.  Denies any major nasal congestion.  No fevers or chills.  No dizziness.  No hearing changes. ? ?Has had persistent elevated lipase levels.  Has had fairly extensive work-up with GI.  This included recent MRI abdomen which showed no acute findings or explanations.  No further evaluation recommended at this time per GI. ? ?Past Medical History:  ?Diagnosis Date  ? Allergy to alpha-gal   ? Anxiety   ? Chronic headaches   ? Clavicular fracture   ? Diverticulosis   ? History of chickenpox   ? Mild intermittent asthma 06/07/2018  ? Shingles   ? 26 years old.  ? Tubular adenoma of colon   ? Urticaria 05/18/2020  ? ?Past Surgical History:  ?Procedure Laterality Date  ? COLONOSCOPY    ? CYSTECTOMY  01/2019  ? scrotum area   ? ESOPHAGEAL MANOMETRY N/A 06/09/2020  ? Procedure: ESOPHAGEAL MANOMETRY (EM);  Surgeon: Lavena Bullion, DO;  Location: [facility];  Service: Gastroenterology;  Laterality: N/A;  ? ESOPHAGOGASTRODUODENOSCOPY    ? shoulder surgery Right 08/26/2019  ? WISDOM TOOTH EXTRACTION    ? ? reports that he has never smoked. He has never used smokeless tobacco. He reports current alcohol use of about 3.0 standard drinks per week. He reports current drug use. Drug: Marijuana. ?family history includes Colon polyps in his mother; Diabetes in his father; Hypertension in his mother; Migraines in his  sister. ?Allergies  ?Allergen Reactions  ? Shellfish Allergy Anaphylaxis  ? Hydrocodone Itching  ?  Pt states he feels itchy after taking, but it doesn't preclude him taking hydrocodone if needed ?About 2-3 years ago.  It was the first time he took hydrocodone.  ? Other Diarrhea  ?  Slightly allergic to red meat since tick bite.Marland KitchenMarland KitchenNot anaphylaxis, but GI sensitivity.  ? ? ?Review of Systems  ?Constitutional:  Negative for chills and fever.  ?HENT:  Positive for ear pain. Negative for ear discharge, hearing loss, sinus pain and tinnitus.   ? ?  ?Objective:  ?  ? ?BP 120/88 (BP Location: Left Arm, Patient Position: Sitting, Cuff Size: Large)   Pulse 76   Temp 97.9 ?F (36.6 ?C) (Oral)   Ht '6\' 6"'$  (1.981 m)   Wt (!) 380 lb 3.2 oz (172.5 kg)   SpO2 98%   BMI 43.94 kg/m?  ? ? ?Physical Exam ?Vitals reviewed.  ?Constitutional:   ?   Appearance: Normal appearance.  ?HENT:  ?   Ears:  ?   Comments: Right ear canal and TM appear normal.  No bleeding.  No erythema.  No obvious effusion. ?Cardiovascular:  ?   Rate and Rhythm: Normal rate and regular rhythm.  ?Neurological:  ?   Mental Status: He  is alert.  ? ? ? ?No results found for any visits on 01/27/22. ? ? ? ?The ASCVD Risk score (Arnett DK, et al., 2019) failed to calculate for the following reasons: ?  The 2019 ASCVD risk score is only valid for ages 62 to 75 ? ?  ?Assessment & Plan:  ? ?Right otalgia.  Normal exam.  No effusion.  No signs of otitis externa or otitis media.  No evidence on exam for ear trauma. ? ?-Reassurance and observation for now.  Follow-up for any persistent symptoms or new symptoms ? ?No follow-ups on file.  ? ? ?Carolann Littler, MD ? ?

## 2022-02-03 ENCOUNTER — Emergency Department
Admission: EM | Admit: 2022-02-03 | Discharge: 2022-02-03 | Disposition: A | Discharge status: Home / Self Care | Payer: 59 | Source: Home / Self Care | Attending: Family Medicine | Admitting: Family Medicine

## 2022-02-03 DIAGNOSIS — H6991 Unspecified Eustachian tube disorder, right ear: Secondary | ICD-10-CM

## 2022-02-03 DIAGNOSIS — M26621 Arthralgia of right temporomandibular joint: Secondary | ICD-10-CM

## 2022-02-03 DIAGNOSIS — H6981 Other specified disorders of Eustachian tube, right ear: Secondary | ICD-10-CM | POA: Diagnosis not present

## 2022-02-03 NOTE — ED Provider Notes (Signed)
?[facility] ? ? ? ?CSN: [id number] ?Arrival date & time: 02/03/22  1003 ? ? ?  ? ?History   ?Chief Complaint ?Chief Complaint  ?Patient presents with  ? Otalgia  ? ? ?HPI ?Bradley [name] is a 26 y.o. male.  ? ?Patient complains of mild pain in his right ear for about 9 days.  He notes that his hearing is somewhat muffled, and he has pain when chewing.  He feels well otherwise and denies nasal congestion or recent URI. ? ?The history is provided by the patient.  ?Otalgia ?Location:  Right ?Quality:  Aching ?Severity:  Mild ?Onset quality:  Gradual ?Duration:  9 days ?Timing:  Intermittent ?Progression:  Worsening ?Chronicity:  New ?Context: not direct blow, not elevation change, not foreign body in ear, not loud noise, not recent URI and not water in ear   ?Relieved by:  Nothing ?Exacerbated by: chewing. ?Ineffective treatments:  None tried ?Associated symptoms: no congestion, no ear discharge, no fever, no hearing loss, no neck pain, no rash, no rhinorrhea and no sore throat   ? ?Past Medical History:  ?Diagnosis Date  ? Allergy to alpha-gal   ? Anxiety   ? Chronic headaches   ? Clavicular fracture   ? Diverticulosis   ? History of chickenpox   ? Mild intermittent asthma 06/07/2018  ? Shingles   ? 26 years old.  ? Tubular adenoma of colon   ? Urticaria 05/18/2020  ? ? ?Patient Active Problem List  ? Diagnosis Date Noted  ? Not well controlled moderate persistent asthma 09/30/2021  ? Allergy to alpha-gal 06/16/2021  ? Right ankle pain 07/06/2020  ? Dysphagia   ? Gastroesophageal reflux disease 05/19/2020  ? Urticaria 05/18/2020  ? Seasonal and perennial allergic rhinoconjunctivitis 04/19/2020  ? Mild persistent asthma, uncomplicated 24/26/8341  ? Low back pain 12/18/2019  ? Shortness of breath 10/15/2019  ? 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 Hamstring injury, right, initial encounter 01/23/2019  ? Migraine without aura and without status migrainosus, not intractable 07/10/2018  ?  Morbid obesity with BMI of 40.0-44.9, adult ([place]) 07/10/2018  ? Mild intermittent asthma without complication 96/22/2979  ? Atypical chest pain 12/21/2017  ? Generalized abdominal pain 12/17/2017  ? Anxiety 11/28/2017  ? ? ?Past Surgical History:  ?Procedure Laterality Date  ? COLONOSCOPY    ? CYSTECTOMY  01/2019  ? scrotum area   ? ESOPHAGEAL MANOMETRY N/A 06/09/2020  ? Procedure: ESOPHAGEAL MANOMETRY (EM);  Surgeon: Lavena Bullion, DO;  Location: [facility];  Service: Gastroenterology;  Laterality: N/A;  ? ESOPHAGOGASTRODUODENOSCOPY    ? shoulder surgery Right 08/26/2019  ? WISDOM TOOTH EXTRACTION    ? ? ? ? ? ?Home Medications   ? ?Prior to Admission medications   ?Medication Sig Start Date End Date Taking? Authorizing Provider  ?albuterol (VENTOLIN HFA) 108 (90 Base) MCG/ACT inhaler INHALE 2 PUFFS INTO THE LUNGS EVERY 6 HOURS AS NEEDED FOR WHEEZING OR SHORTNESS OF BREATH 10/24/21   Ambs, Kathrine Cords, FNP  ?ALVESCO 160 MCG/ACT inhaler Inhale 2 puffs into the lungs 2 (two) times daily. 12/29/21   Dara Hoyer, FNP  ?AMBULATORY NON FORMULARY MEDICATION Diltiazem gel 2% with lidocaine  ?Apply a pea sized amount into your rectum three times daily as needed ?Dispense 30 GM zero refill 01/04/22   Vladimir Crofts, PA-C  ?AUVI-Q 0.3 MG/0.3ML SOAJ injection SMARTSIG:1 IM Daily 09/06/21   [provider]  ?citalopram (CELEXA) 20 MG tablet TAKE 1  TABLET(20 MG) BY MOUTH DAILY 12/12/21   Burchette, Alinda Sierras, MD  ?levocetirizine (XYZAL) 5 MG tablet Take by mouth.    [provider]  ?montelukast (SINGULAIR) 10 MG tablet TAKE 1 TABLET(10 MG) BY MOUTH AT BEDTIME 01/02/22   Ambs, Kathrine Cords, FNP  ?NURTEC 75 MG TBDP Take 1 tablet by mouth daily as needed. 09/01/21   [provider]  ?ondansetron (ZOFRAN-ODT) 8 MG disintegrating tablet Take 1 tablet (8 mg total) by mouth every 8 (eight) hours as needed for nausea or vomiting. 12/30/21   Eliezer Lofts, FNP  ?RESTASIS 0.05 % ophthalmic emulsion 1 drop 2 (two) times  daily. 08/01/21   [provider]  ?sildenafil (VIAGRA) 100 MG tablet TAKE 1/2 TO 1 TABLET(50 TO 100 MG) BY MOUTH DAILY AS NEEDED FOR ERECTILE DYSFUNCTION 01/02/22   Burchette, Alinda Sierras, MD  ?sucralfate (CARAFATE) 1 g tablet Take 1 tablet (1 g total) by mouth 2 (two) times daily. Mix one tablet with 1 tablespoon of warm water to make a slurry and drink. Do this twice a day. 12/28/21   Mansouraty, Telford Nab., MD  ? ? ?Family History ?Family History  ?Problem Relation Age of Onset  ? Hypertension Mother   ? Colon polyps Mother   ?     might be mistakrn  ? Diabetes Father   ?     type II  ? Migraines Sister   ? Colon cancer Neg Hx   ? Esophageal cancer Neg Hx   ? Rectal cancer Neg Hx   ? Stomach cancer Neg Hx   ? ? ?Social History ?Social History  ? ?Tobacco Use  ? Smoking status: Never  ? Smokeless tobacco: Never  ?Vaping Use  ? Vaping Use: Never used  ?Substance Use Topics  ? Alcohol use: Yes  ?  Alcohol/week: 3.0 standard drinks  ?  Types: 3 Standard drinks or equivalent per week  ?  Comment: every other week. not a big drinker per patient  ? Drug use: Yes  ?  Types: Marijuana  ?  Comment: rare use  ? ? ? ?Allergies   ?Shellfish allergy, Hydrocodone, and Other ? ? ?Review of Systems ?Review of Systems  ?Constitutional:  Negative for fever.  ?HENT:  Positive for ear pain. Negative for congestion, ear discharge, hearing loss, rhinorrhea and sore throat.   ?Musculoskeletal:  Negative for neck pain.  ?Skin:  Negative for rash.  ? ? ?Physical Exam ?Triage Vital Signs ?ED Triage Vitals  ?Enc Vitals Group  ?   BP 02/03/22 1040 133/86  ?   Pulse Rate 02/03/22 1040 80  ?   Resp 02/03/22 1040 20  ?   Temp 02/03/22 1040 98.3 ?F (36.8 ?C)  ?   Temp Source 02/03/22 1040 Oral  ?   SpO2 02/03/22 1040 98 %  ?   Weight 02/03/22 1033 (!) 370 lb (167.8 kg)  ?   Height 02/03/22 1033 '6\' 6"'$  (1.981 m)  ?   Head Circumference --   ?   Peak Flow --   ?   Pain Score 02/03/22 1033 4  ?   Pain Loc --   ?   Pain Edu? --   ?   Excl. in [place]?  --   ? ?No data found. ? ?Updated Vital Signs ?BP 133/86 (BP Location: Right Arm)   Pulse 80   Temp 98.3 ?F (36.8 ?C) (Oral)   Resp 20   Ht '6\' 6"'$  (1.981 m)   Wt (!) 167.8  kg   SpO2 98%   BMI 42.76 kg/m?  ? ?Visual Acuity ?Right Eye Distance:   ?Left Eye Distance:   ?Bilateral Distance:   ? ?Right Eye Near:   ?Left Eye Near:    ?Bilateral Near:    ? ?Physical Exam ?Vitals and nursing note reviewed. Exam conducted with a chaperone present.  ?Constitutional:   ?   General: He is not in acute distress. ?   Appearance: He is obese.  ?HENT:  ?   Head: Normocephalic.  ?   Comments: There is distinct tenderness over the right temporomandibular joint.  Palpation there recreates his pain.  ?   Right Ear: Tympanic membrane, ear canal and external ear normal. There is no impacted cerumen.  ?   Left Ear: Tympanic membrane, ear canal and external ear normal. There is no impacted cerumen.  ?   Nose: Nose normal.  ?   Mouth/Throat:  ?   Pharynx: Oropharynx is clear.  ?Eyes:  ?   Conjunctiva/sclera: Conjunctivae normal.  ?   Pupils: Pupils are equal, round, and reactive to light.  ?Cardiovascular:  ?   Rate and Rhythm: Normal rate.  ?Pulmonary:  ?   Effort: Pulmonary effort is normal.  ?Musculoskeletal:  ?   Cervical back: Neck supple.  ?Lymphadenopathy:  ?   Cervical: No cervical adenopathy.  ?Skin: ?   General: Skin is warm and dry.  ?   Findings: No rash.  ?Neurological:  ?   Mental Status: He is alert.  ? ? ? ?UC Treatments / Results  ?Labs ?(all labs ordered are listed, but only abnormal results are displayed) ?Labs Reviewed -   ?Tympanometry:  Right ear tympanogram positive peak pressure; Left ear tympanogram normal ? ?EKG ? ? ?Radiology ?No results found. ? ?Procedures ?Procedures (including critical care time) ? ?Medications Ordered in UC ?Medications - No data to display ? ?Initial Impression / Assessment and Plan / UC Course  ?I have reviewed the triage vital signs and the nursing notes. ? ?Pertinent labs & imaging  results that were available during my care of the patient were reviewed by me and considered in my medical decision making (see chart for details). ? ?  ?There is no evidence of bacterial infection today.   ?

## 2022-02-03 NOTE — ED Triage Notes (Signed)
Pt presents to Urgent Care with c/o bilateral ear pain, w/ more pain to R ear, x approx 10 days.  ?

## 2022-02-03 NOTE — Discharge Instructions (Signed)
Try taking Ibuprofen '200mg'$ , 4 tabs every 8 hours with food.  ?May use Afrin nasal spray (or generic oxymetazoline) each morning for about 5 days and then discontinue.  Use Flonase nasal spray each morning after using Afrin nasal spray and saline nasal irrigation. ?Apply ice pack to right TMJ for about 15 minutes, 2 to 3 times daily  Continue until pain decreases.  ?

## 2022-02-16 ENCOUNTER — Ambulatory Visit: Payer: 59 | Admitting: Family Medicine

## 2022-02-16 ENCOUNTER — Encounter: Payer: Self-pay | Admitting: Family Medicine

## 2022-02-16 VITALS — BP 124/86 | HR 77 | Temp 98.6°F | Wt 380.0 lb

## 2022-02-16 DIAGNOSIS — L723 Sebaceous cyst: Secondary | ICD-10-CM | POA: Diagnosis not present

## 2022-02-16 DIAGNOSIS — H6993 Unspecified Eustachian tube disorder, bilateral: Secondary | ICD-10-CM

## 2022-02-16 DIAGNOSIS — H6983 Other specified disorders of Eustachian tube, bilateral: Secondary | ICD-10-CM | POA: Diagnosis not present

## 2022-02-16 NOTE — Progress Notes (Signed)
   Subjective:    Patient ID: Bradley [name], male    DOB: Nov 21, 1995, 26 y.o.   MRN: [id number]  HPI Here for several issues. First he has had pressure and pain in both ears for the past month. No fever or PND or ST or cough. He takes Xyzal for seasonal allergies. He saw Dr. Elease Hashimoto on 01-27-22 for this and he was diagnosed with eustachian tube dysfunction.  He then went to urgent care on 02-03-22 for this and he was told he had "fluid in the ears". Today the problem persists. Also for the past 3 weeks he has had a cyst on the lower abdomen that drains yellowish fluid off and on. It is not painful. The past few days it has looked a lot better.   Review of Systems  Constitutional: Negative.   HENT:  Positive for congestion and ear pain. Negative for facial swelling, postnasal drip, sinus pressure and sneezing.   Eyes: Negative.   Respiratory: Negative.        Objective:   Physical Exam Constitutional:      Appearance: Normal appearance. He is not ill-appearing.  HENT:     Right Ear: Tympanic membrane, ear canal and external ear normal.     Left Ear: Tympanic membrane, ear canal and external ear normal.     Nose: Nose normal.     Mouth/Throat:     Pharynx: Oropharynx is clear.  Eyes:     Conjunctiva/sclera: Conjunctivae normal.  Pulmonary:     Effort: Pulmonary effort is normal.     Breath sounds: Normal breath sounds.  Skin:    Comments: There is a small sebaceous cyst on the left lower abdomen. This is slightly red but not warm or tender. It is mostly flat today.   Neurological:     Mental Status: He is alert.          Assessment & Plan:  He has eustachian tube dysfunction. I suggested he add Mucinex D and Flonase sprays to his daily regimen, at least until the pollen season is over. The sebaceous cyst seems to be resolving on its own. We agreed to simply monitor this for now.  Alysia Penna, MD

## 2022-02-24 ENCOUNTER — Ambulatory Visit: Payer: 59 | Admitting: Family Medicine

## 2022-02-24 ENCOUNTER — Encounter: Payer: Self-pay | Admitting: Family Medicine

## 2022-02-24 ENCOUNTER — Ambulatory Visit (INDEPENDENT_AMBULATORY_CARE_PROVIDER_SITE_OTHER): Payer: 59

## 2022-02-24 VITALS — BP 120/86 | HR 78 | Temp 97.8°F | Ht 78.0 in | Wt 374.5 lb

## 2022-02-24 DIAGNOSIS — Z03821 Encounter for observation for suspected ingested foreign body ruled out: Secondary | ICD-10-CM

## 2022-02-24 DIAGNOSIS — L237 Allergic contact dermatitis due to plants, except food: Secondary | ICD-10-CM

## 2022-02-24 DIAGNOSIS — H9201 Otalgia, right ear: Secondary | ICD-10-CM

## 2022-02-24 MED ORDER — METHYLPREDNISOLONE ACETATE 80 MG/ML IJ SUSP
80.0000 mg | Freq: Once | INTRAMUSCULAR | Status: AC
  Administered 2022-02-24: 80 mg via INTRAMUSCULAR

## 2022-02-24 MED ORDER — METHYLPREDNISOLONE ACETATE 40 MG/ML IJ SUSP
40.0000 mg | Freq: Once | INTRAMUSCULAR | Status: AC
  Administered 2022-02-24: 40 mg via INTRAMUSCULAR

## 2022-02-24 NOTE — Progress Notes (Signed)
Established Patient Office Visit  Subjective   Patient ID: Bradley [name], male    DOB: Dec 11, 1995  Age: 26 y.o. MRN: [id number]  Chief Complaint  Patient presents with   Follow-up   Poison Ivy    X2 days    HPI   Seen for several issues as follows.  Pruritic rash on his extremities with possibly some early involving the trunk.  He states that Wednesday he was weed eating.  He had on short sleeves.  But yesterday broke out in rash consistent with contact dermatitis.  He had this previously.  Moderate pruritus.  He relates some right ear pressure and discomfort.  He wonders if this may be radiating from a dental problem.  No dizziness.  No hearing loss.  No fevers or chills.  He states that he does not vape consistently but recently had a small vape in his hand and went to sleep.  He woke up and cannot find this anywhere.  He has some concerns whether he may have swallowed this.  He denies any abdominal pain.  No dysphagia.  Past Medical History:  Diagnosis Date   Allergy to alpha-gal    Anxiety    Chronic headaches    Clavicular fracture    Diverticulosis    History of chickenpox    Mild intermittent asthma 06/07/2018   Shingles    26 years old.   Tubular adenoma of colon    Urticaria 05/18/2020   Past Surgical History:  Procedure Laterality Date   COLONOSCOPY     CYSTECTOMY  01/2019   scrotum area    ESOPHAGEAL MANOMETRY N/A 06/09/2020   Procedure: ESOPHAGEAL MANOMETRY (EM);  Surgeon: Lavena Bullion, DO;  Location: [facility];  Service: Gastroenterology;  Laterality: N/A;   ESOPHAGOGASTRODUODENOSCOPY     shoulder surgery Right 08/26/2019   WISDOM TOOTH EXTRACTION      reports that he has never smoked. He has never used smokeless tobacco. He reports current alcohol use of about 3.0 standard drinks per week. He reports current drug use. Drug: Marijuana. family history includes Colon polyps in his mother; Diabetes in his father; Hypertension in his mother;  Migraines in his sister. Allergies  Allergen Reactions   Shellfish Allergy Anaphylaxis   Hydrocodone Itching    Pt states he feels itchy after taking, but it doesn't preclude him taking hydrocodone if needed About 2-3 years ago.  It was the first time he took hydrocodone.   Other Diarrhea    Slightly allergic to red meat since tick bite.Marland KitchenMarland KitchenNot anaphylaxis, but GI sensitivity.    Review of Systems  Constitutional:  Negative for chills and fever.  HENT:  Positive for ear pain. Negative for ear discharge.   Respiratory:  Negative for shortness of breath.   Cardiovascular:  Negative for chest pain.  Gastrointestinal:  Negative for abdominal pain, nausea and vomiting.  Skin:  Positive for rash.     Objective:     BP 120/86 (BP Location: Left Arm, Patient Position: Sitting, Cuff Size: Large)   Pulse 78   Temp 97.8 F (36.6 C) (Oral)   Ht '6\' 6"'$  (1.981 m)   Wt (!) 374 lb 8 oz (169.9 kg)   SpO2 98%   BMI 43.28 kg/m    Physical Exam Vitals reviewed.  Constitutional:      Appearance: Normal appearance.  HENT:     Right Ear: Tympanic membrane, ear canal and external ear normal.     Left Ear: Tympanic membrane, ear canal  and external ear normal.     Mouth/Throat:     Mouth: Mucous membranes are moist.     Pharynx: Oropharynx is clear. No oropharyngeal exudate or posterior oropharyngeal erythema.  Cardiovascular:     Rate and Rhythm: Normal rate and regular rhythm.  Pulmonary:     Effort: Pulmonary effort is normal.     Breath sounds: Normal breath sounds.  Skin:    Findings: Rash present.     Comments: Multiple areas of scattered rash upper extremities with erythema and some linear vesiculation consistent with contact dermatitis.  Neurological:     Mental Status: He is alert.     No results found for any visits on 02/24/22.    The ASCVD Risk score (Arnett DK, et al., 2019) failed to calculate for the following reasons:   The 2019 ASCVD risk score is only valid for ages  69 to 58    Assessment & Plan:   #1 contact dermatitis with fairly widespread upper extremity involvement.  Patient requesting IM steroids which has worked well for him in the past.  Reviewed potential side effects.  Depo-Medrol 120 mg IM given.  Watch for any breakthrough issues  #2 right otalgia with normal exam.  Question referred pain.  Continue follow-up with dentist.  No evidence for otitis media  #3 concern for possible foreign body ingestion.  Relatively low clinical suspicion.  Basically, he fell asleep with a vape and cannot find a vape afterwards.  He specifically is wondering if he may have swallowed this.  We explained this may not show up with plain imaging unless there is any metal substance in the vape -Obtain 1 view abdominal film to further assess -Watch for any abdominal pain or other new symptoms   No follow-ups on file.    Carolann Littler, MD

## 2022-02-26 ENCOUNTER — Ambulatory Visit (HOSPITAL_COMMUNITY)
Admission: EM | Admit: 2022-02-26 | Discharge: 2022-02-26 | Disposition: A | Discharge status: Home / Self Care | Payer: 59 | Attending: Student | Admitting: Student

## 2022-02-26 ENCOUNTER — Encounter (HOSPITAL_COMMUNITY): Payer: Self-pay | Admitting: Emergency Medicine

## 2022-02-26 DIAGNOSIS — L255 Unspecified contact dermatitis due to plants, except food: Secondary | ICD-10-CM | POA: Diagnosis not present

## 2022-02-26 MED ORDER — METHYLPREDNISOLONE SODIUM SUCC 125 MG IJ SOLR
INTRAMUSCULAR | Status: AC
  Filled 2022-02-26: qty 2

## 2022-02-26 MED ORDER — PREDNISONE 10 MG (21) PO TBPK: ORAL_TABLET | Freq: Every day | ORAL | 0 refills | Status: DC

## 2022-02-26 MED ORDER — METHYLPREDNISOLONE SODIUM SUCC 125 MG IJ SOLR
80.0000 mg | Freq: Once | INTRAMUSCULAR | Status: AC
Start: 2022-02-26 — End: 2022-02-26
  Administered 2022-02-26: 80 mg via INTRAMUSCULAR

## 2022-02-26 NOTE — ED Provider Notes (Signed)
[place]    CSN: [id number] Arrival date & time: 02/26/22  1710      History   Chief Complaint Chief Complaint  Patient presents with   Rash    HPI Bradley [name] is a 26 y.o. male presenting with urticarial rash for 5 days.  History noncontributory, denies history of diabetes.  He states he got into some poison ivy last week, he saw his primary care on 6/2 and received a shot of a steroid without relief.  He states the lesions are just on his arms, no rash or lesion elsewhere.  HPI  Past Medical History:  Diagnosis Date   Allergy to alpha-gal    Anxiety    Chronic headaches    Clavicular fracture    Diverticulosis    History of chickenpox    Mild intermittent asthma 06/07/2018   Shingles    26 years old.   Tubular adenoma of colon    Urticaria 05/18/2020    Patient Active Problem List   Diagnosis Date Noted   Not well controlled moderate persistent asthma 09/30/2021   Allergy to alpha-gal 06/16/2021   Right ankle pain 07/06/2020   Dysphagia    Gastroesophageal reflux disease 05/19/2020   Urticaria 05/18/2020   Seasonal and perennial allergic rhinoconjunctivitis 04/19/2020   Mild persistent asthma, uncomplicated 78/29/5621   Low back pain 12/18/2019   Shortness of breath 10/15/2019   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   Hamstring injury, right, initial encounter 01/23/2019   Migraine without aura and without status migrainosus, not intractable 07/10/2018   Morbid obesity with BMI of 40.0-44.9, adult ([place]) 07/10/2018   Mild intermittent asthma without complication 30/86/5784   Atypical chest pain 12/21/2017   Generalized abdominal pain 12/17/2017   Anxiety 11/28/2017    Past Surgical History:  Procedure Laterality Date   COLONOSCOPY     CYSTECTOMY  01/2019   scrotum area    ESOPHAGEAL MANOMETRY N/A 06/09/2020   Procedure: ESOPHAGEAL MANOMETRY (EM);  Surgeon: Lavena Bullion, DO;  Location: [facility];  Service:  Gastroenterology;  Laterality: N/A;   ESOPHAGOGASTRODUODENOSCOPY     shoulder surgery Right 08/26/2019   WISDOM TOOTH EXTRACTION         Home Medications    Prior to Admission medications   Medication Sig Start Date End Date Taking? Authorizing Provider  predniSONE (STERAPRED UNI-PAK 21 TAB) 10 MG (21) TBPK tablet Take by mouth daily. Take 6 tabs by mouth daily  for 2 days, then 5 tabs for 2 days, then 4 tabs for 2 days, then 3 tabs for 2 days, 2 tabs for 2 days, then 1 tab by mouth daily for 2 days 02/26/22  Yes Phillip Heal, Sherlon Handing, PA-C  albuterol (VENTOLIN HFA) 108 (90 Base) MCG/ACT inhaler INHALE 2 PUFFS INTO THE LUNGS EVERY 6 HOURS AS NEEDED FOR WHEEZING OR SHORTNESS OF BREATH 10/24/21   Ambs, Kathrine Cords, FNP  ALVESCO 160 MCG/ACT inhaler Inhale 2 puffs into the lungs 2 (two) times daily. 12/29/21   Ambs, Kathrine Cords, FNP  AMBULATORY NON FORMULARY MEDICATION Diltiazem gel 2% with lidocaine  Apply a pea sized amount into your rectum three times daily as needed Dispense 30 GM zero refill 01/04/22   Vladimir Crofts, PA-C  AUVI-Q 0.3 MG/0.3ML SOAJ injection SMARTSIG:1 IM Daily 09/06/21   [provider]  citalopram (CELEXA) 20 MG tablet TAKE 1 TABLET(20 MG) BY MOUTH DAILY 12/12/21   Burchette, Alinda Sierras, MD  levocetirizine (XYZAL) 5 MG tablet  Take by mouth.    [provider]  montelukast (SINGULAIR) 10 MG tablet TAKE 1 TABLET(10 MG) BY MOUTH AT BEDTIME 01/02/22   Ambs, Kathrine Cords, FNP  NURTEC 75 MG TBDP Take 1 tablet by mouth daily as needed. 09/01/21   [provider]  ondansetron (ZOFRAN-ODT) 8 MG disintegrating tablet Take 1 tablet (8 mg total) by mouth every 8 (eight) hours as needed for nausea or vomiting. 12/30/21   Eliezer Lofts, FNP  RESTASIS 0.05 % ophthalmic emulsion 1 drop 2 (two) times daily. 08/01/21   [provider]  sildenafil (VIAGRA) 100 MG tablet TAKE 1/2 TO 1 TABLET(50 TO 100 MG) BY MOUTH DAILY AS NEEDED FOR ERECTILE DYSFUNCTION 01/02/22   Burchette, Alinda Sierras, MD   sucralfate (CARAFATE) 1 g tablet Take 1 tablet (1 g total) by mouth 2 (two) times daily. Mix one tablet with 1 tablespoon of warm water to make a slurry and drink. Do this twice a day. 12/28/21   Mansouraty, Telford Nab., MD    Family History Family History  Problem Relation Age of Onset   Hypertension Mother    Colon polyps Mother        might be mistakrn   Diabetes Father        type II   Migraines Sister    Colon cancer Neg Hx    Esophageal cancer Neg Hx    Rectal cancer Neg Hx    Stomach cancer Neg Hx     Social History Social History   Tobacco Use   Smoking status: Never   Smokeless tobacco: Never  Vaping Use   Vaping Use: Never used  Substance Use Topics   Alcohol use: Yes    Alcohol/week: 3.0 standard drinks    Types: 3 Standard drinks or equivalent per week    Comment: every other week. not a big drinker per patient   Drug use: Yes    Types: Marijuana    Comment: rare use     Allergies   Shellfish allergy, Hydrocodone, and Other   Review of Systems Review of Systems  Skin:  Positive for color change.  All other systems reviewed and are negative.   Physical Exam Triage Vital Signs ED Triage Vitals  Enc Vitals Group     BP 02/26/22 1736 140/88     Pulse Rate 02/26/22 1736 71     Resp 02/26/22 1736 19     Temp 02/26/22 1736 98.4 F (36.9 C)     Temp src --      SpO2 02/26/22 1736 97 %     Weight --      Height --      Head Circumference --      Peak Flow --      Pain Score 02/26/22 1735 0     Pain Loc --      Pain Edu? --      Excl. in [place]? --    No data found.  Updated Vital Signs BP 140/88 (BP Location: Left Arm)   Pulse 71   Temp 98.4 F (36.9 C)   Resp 19   SpO2 97%   Visual Acuity Right Eye Distance:   Left Eye Distance:   Bilateral Distance:    Right Eye Near:   Left Eye Near:    Bilateral Near:     Physical Exam Vitals reviewed.  Constitutional:      General: He is not in acute distress.    Appearance: Normal  appearance. He  is not ill-appearing.  HENT:     Head: Normocephalic and atraumatic.  Pulmonary:     Effort: Pulmonary effort is normal.  Skin:    Comments: Bilateral upper extremities-antecubital fossa with erythematous and vesicular rash.  Few satellite lesions on the bilateral forearms.  No rash or lesion elsewhere  Neurological:     General: No focal deficit present.     Mental Status: He is alert and oriented to person, place, and time.  Psychiatric:        Mood and Affect: Mood normal.        Behavior: Behavior normal.        Thought Content: Thought content normal.        Judgment: Judgment normal.     UC Treatments / Results  Labs (all labs ordered are listed, but only abnormal results are displayed) Labs Reviewed - No data to display  EKG   Radiology No results found.  Procedures Procedures (including critical care time)  Medications Ordered in UC Medications  methylPREDNISolone sodium succinate (SOLU-MEDROL) 125 mg/2 mL injection 80 mg (has no administration in time range)    Initial Impression / Assessment and Plan / UC Course  I have reviewed the triage vital signs and the nursing notes.  Pertinent labs & imaging results that were available during my care of the patient were reviewed by me and considered in my medical decision making (see chart for details).     This patient is a very pleasant 26 y.o. year old male presenting with rhus dermatitis x5 days. Afebrile, nontachy. IM solumedrol administered, and steroid taper sent to start tomorrow. He is not a diabetic . ED return precautions discussed. Patient verbalizes understanding and agreement.  .   Final Clinical Impressions(s) / UC Diagnoses   Final diagnoses:  Rhus dermatitis     Discharge Instructions      -Starting tomorrow - Prednisone taper for cough/bronchitis. I recommend taking this in the morning as it could give you energy.  Avoid NSAIDs like ibuprofen and alleve while taking this  medication as they can increase your risk of stomach upset and even GI bleeding when in combination with a steroid. You can continue tylenol (acetaminophen) up to '1000mg'$  3x daily. -Benedryl (diphenhydramine) 25-'50mg'$  (1-2 pills) as needed for itching, up to every 6 hours.  This medication will cause drowsiness.     ED Prescriptions     Medication Sig Dispense Auth. Provider   predniSONE (STERAPRED UNI-PAK 21 TAB) 10 MG (21) TBPK tablet Take by mouth daily. Take 6 tabs by mouth daily  for 2 days, then 5 tabs for 2 days, then 4 tabs for 2 days, then 3 tabs for 2 days, 2 tabs for 2 days, then 1 tab by mouth daily for 2 days 42 tablet [name] Sams, PA-C      PDMP not reviewed this encounter.   [name] Sams, PA-C 02/26/22 1817

## 2022-02-26 NOTE — ED Triage Notes (Signed)
Pt got into poison ivy on Wed. Saw PCP and got steroid shot on 6/2. Reports not helping.

## 2022-02-26 NOTE — Discharge Instructions (Addendum)
-  Starting tomorrow - Prednisone taper for cough/bronchitis. I recommend taking this in the morning as it could give you energy.  Avoid NSAIDs like ibuprofen and alleve while taking this medication as they can increase your risk of stomach upset and even GI bleeding when in combination with a steroid. You can continue tylenol (acetaminophen) up to '1000mg'$  3x daily. -Benedryl (diphenhydramine) 25-'50mg'$  (1-2 pills) as needed for itching, up to every 6 hours.  This medication will cause drowsiness.

## 2022-03-01 ENCOUNTER — Ambulatory Visit: Payer: 59 | Admitting: Family Medicine

## 2022-03-01 ENCOUNTER — Encounter: Payer: Self-pay | Admitting: Family Medicine

## 2022-03-01 ENCOUNTER — Telehealth: Payer: 59 | Admitting: Family Medicine

## 2022-03-01 VITALS — BP 126/84 | HR 75 | Temp 97.6°F | Ht 78.0 in | Wt 397.3 lb

## 2022-03-01 DIAGNOSIS — L255 Unspecified contact dermatitis due to plants, except food: Secondary | ICD-10-CM | POA: Diagnosis not present

## 2022-03-01 NOTE — Progress Notes (Signed)
Established Patient Office Visit  Subjective   Patient ID: Bradley [name], male    DOB: 02-12-1996  Age: 26 y.o. MRN: [id number]  Chief Complaint  Patient presents with   Follow-up    HPI   Here for follow-up regarding contact dermatitis rash.  Was seen here on the second and had diffuse involvement predominantly upper extremities.  He was given Depo-Medrol 120 mg IM.  We had discussed possible oral treatment as well.  He continued to have significant pruritus and he went to urgent care 2 days later and was given another shot of steroid along with oral taper for 12 days.  He is finally getting some improvement in terms of pruritus and drying of the rash.  Has also used topical calamine.  No fever.  Has had some mild insomnia with the steroids otherwise tolerating well.  Past Medical History:  Diagnosis Date   Allergy to alpha-gal    Anxiety    Chronic headaches    Clavicular fracture    Diverticulosis    History of chickenpox    Mild intermittent asthma 06/07/2018   Shingles    26 years old.   Tubular adenoma of colon    Urticaria 05/18/2020   Past Surgical History:  Procedure Laterality Date   COLONOSCOPY     CYSTECTOMY  01/2019   scrotum area    ESOPHAGEAL MANOMETRY N/A 06/09/2020   Procedure: ESOPHAGEAL MANOMETRY (EM);  Surgeon: Lavena Bullion, DO;  Location: [facility];  Service: Gastroenterology;  Laterality: N/A;   ESOPHAGOGASTRODUODENOSCOPY     shoulder surgery Right 08/26/2019   WISDOM TOOTH EXTRACTION      reports that he has never smoked. He has never used smokeless tobacco. He reports current alcohol use of about 3.0 standard drinks per week. He reports current drug use. Drug: Marijuana. family history includes Colon polyps in his mother; Diabetes in his father; Hypertension in his mother; Migraines in his sister. Allergies  Allergen Reactions   Shellfish Allergy Anaphylaxis   Hydrocodone Itching    Pt states he feels itchy after taking, but it doesn't  preclude him taking hydrocodone if needed About 2-3 years ago.  It was the first time he took hydrocodone.   Other Diarrhea    Slightly allergic to red meat since tick bite.Marland KitchenMarland KitchenNot anaphylaxis, but GI sensitivity.    Review of Systems  Constitutional:  Negative for chills and fever.  Skin:  Positive for rash.     Objective:     BP 126/84 (BP Location: Left Arm, Patient Position: Sitting, Cuff Size: Large)   Pulse 75   Temp 97.6 F (36.4 C) (Oral)   Ht '6\' 6"'$  (1.981 m)   Wt (!) 397 lb 4.8 oz (180.2 kg)   SpO2 98%   BMI 45.91 kg/m    Physical Exam Vitals reviewed.  Constitutional:      Appearance: Normal appearance.  Cardiovascular:     Rate and Rhythm: Normal rate.  Skin:    Comments: Areas of scattered rash both arms and forearms.  This appears to be drying up well.  Rash consistent with contact dermatitis.  No vesicles.  No pustules.  Neurological:     Mental Status: He is alert.     No results found for any visits on 03/01/22.    The ASCVD Risk score (Arnett DK, et al., 2019) failed to calculate for the following reasons:   The 2019 ASCVD risk score is only valid for ages 37 to 6  Assessment & Plan:   Severe contact dermatitis rash finally improving with steroids.  He has had 2 intramuscular injections and is currently doing oral taper for 12 days.  His main concern today was whether there would be concerns regarding adrenal suppression.  We explained for this duration of steroid should not be a problem and he is already tapering off the oral gradually. -May supplement with over-the-counter hydrocortisone cream as needed. -Follow-up as needed  No follow-ups on file.    Carolann Littler, MD

## 2022-03-07 ENCOUNTER — Encounter: Payer: Self-pay | Admitting: Family Medicine

## 2022-03-07 ENCOUNTER — Ambulatory Visit: Payer: 59 | Admitting: Family Medicine

## 2022-03-07 VITALS — BP 130/88 | HR 84 | Temp 98.0°F | Resp 16 | Ht 78.0 in | Wt 379.1 lb

## 2022-03-07 DIAGNOSIS — K219 Gastro-esophageal reflux disease without esophagitis: Secondary | ICD-10-CM | POA: Diagnosis not present

## 2022-03-07 DIAGNOSIS — H1010 Acute atopic conjunctivitis, unspecified eye: Secondary | ICD-10-CM

## 2022-03-07 DIAGNOSIS — L509 Urticaria, unspecified: Secondary | ICD-10-CM | POA: Diagnosis not present

## 2022-03-07 DIAGNOSIS — J302 Other seasonal allergic rhinitis: Secondary | ICD-10-CM | POA: Diagnosis not present

## 2022-03-07 DIAGNOSIS — H6991 Unspecified Eustachian tube disorder, right ear: Secondary | ICD-10-CM | POA: Insufficient documentation

## 2022-03-07 DIAGNOSIS — J4540 Moderate persistent asthma, uncomplicated: Secondary | ICD-10-CM

## 2022-03-07 DIAGNOSIS — H1013 Acute atopic conjunctivitis, bilateral: Secondary | ICD-10-CM

## 2022-03-07 DIAGNOSIS — Z91018 Allergy to other foods: Secondary | ICD-10-CM

## 2022-03-07 DIAGNOSIS — H6981 Other specified disorders of Eustachian tube, right ear: Secondary | ICD-10-CM

## 2022-03-07 DIAGNOSIS — T7800XD Anaphylactic reaction due to unspecified food, subsequent encounter: Secondary | ICD-10-CM

## 2022-03-07 MED ORDER — LEVOCETIRIZINE DIHYDROCHLORIDE 5 MG PO TABS: 5.0000 mg | ORAL_TABLET | Freq: Every evening | ORAL | 5 refills | Status: AC

## 2022-03-07 MED ORDER — AUVI-Q 0.3 MG/0.3ML IJ SOAJ: 0.3000 mg | INTRAMUSCULAR | 1 refills | Status: DC | PRN

## 2022-03-07 MED ORDER — ALBUTEROL SULFATE HFA 108 (90 BASE) MCG/ACT IN AERS: 2.0000 | INHALATION_SPRAY | Freq: Four times a day (QID) | RESPIRATORY_TRACT | 1 refills | Status: DC | PRN

## 2022-03-07 MED ORDER — ALVESCO 160 MCG/ACT IN AERS: 2.0000 | INHALATION_SPRAY | Freq: Two times a day (BID) | RESPIRATORY_TRACT | 5 refills | Status: DC

## 2022-03-07 NOTE — Patient Instructions (Addendum)
Asthma Continue albuterol 2 puffs once every 4 hours as needed for cough or wheeze You may use albuterol 2 puffs 5 to 15 minutes before activity to decrease cough or wheeze For asthma flare, begin Alvesco 160-2 puffs twice a day for 2 weeks or until cough and wheeze free  Allergic rhinitis Continue avoidance measures directed toward mold, cockroach, pollens, dog, and cat as listed below Continue Flonase 2 sprays in each nostril once a day as needed for a stuffy nose.  In the right nostril, point the applicator out toward the right ear. In the left nostril, point the applicator out toward the left ear Continue levocetirizine 5 mg once a day as needed for a runny nose or itch. Remember to rotate to a different antihistamine about every 3 months. Some examples of over the counter antihistamines include Zyrtec (cetirizine), Xyzal (levocetirizine), Allegra (fexofenadine), and Claritin (loratidine).  Continue Azelastine 2 sprays in each nostril twice a day as needed for a runny nose Consider saline nasal rinses as needed for nasal symptoms. 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 In case of an allergic reaction, take Benadryl 50 mg every 4 hours, and if life-threatening symptoms occur, inject with EpiPen 0.3 mg. We can recheck these levels at your next follow-up visit in about 6 months if you are interested  Food allergy Continue to avoid shellfish. In case of an allergic reaction, take Benadryl 50 mg every 4 hours, and if life-threatening symptoms occur, inject with AuviQ 0.3 mg.  Hives Take the least amount of medications while remaining hive free Levocetirizine (Xyzal) 5 mg twice a day and famotidine (Pepcid) 20 mg twice a day. If no symptoms for 7-14 days then decrease to. Levocetirizine (Xyzal) 5 mg twice a day and famotidine (Pepcid) 20 mg once a day.  If no symptoms for 7-14 days then decrease  to. Levocetirizine (Xyzal) 5 mg twice a day.  If no symptoms for 7-14 days then decrease to. Levocetirizine (Xyzal) 5 mg once a day.   May use Benadryl (diphenhydramine) as needed for breakthrough hives       If symptoms return, then step up dosage Keep a detailed symptom journal including foods eaten, contact with allergens, medications taken, weather changes.   Eustachian tube dysfunction Restart nasal saline rinses followed by nasal steroid such as Flonase or Nasacort  Reflux Continue dietary and lifestyle modifications as you have been  Call the clinic if this treatment plan is not working well for you.  Follow up in 6 months or sooner if needed.  Reducing Pollen Exposure The American Academy of Allergy, Asthma and Immunology suggests the following steps to reduce your exposure to pollen during allergy seasons. Do not hang sheets or clothing out to dry; pollen may collect on these items. Do not mow lawns or spend time around freshly cut grass; mowing stirs up pollen. Keep windows closed at night.  Keep car windows closed while driving. Minimize morning activities outdoors, a time when pollen counts are usually at their highest. Stay indoors as much as possible when pollen counts or humidity is high and on windy days when pollen tends to remain in the air longer. Use air conditioning when possible.  Many air conditioners have filters that trap the pollen spores. Use a HEPA room air filter to remove pollen form the indoor air you breathe.  Control of Mold Allergen Mold and fungi can grow on a variety  of surfaces provided certain temperature and moisture conditions exist.  Outdoor molds grow on plants, decaying vegetation and soil.  The major outdoor mold, Alternaria and Cladosporium, are found in very high numbers during hot and dry conditions.  Generally, a late Summer - Fall peak is seen for common outdoor fungal spores.  Rain will temporarily lower outdoor mold spore count, but  counts rise rapidly when the rainy period ends.  The most important indoor molds are Aspergillus and Penicillium.  Dark, humid and poorly ventilated basements are ideal sites for mold growth.  The next most common sites of mold growth are the bathroom and the kitchen.  Outdoor [employer] Use air conditioning and keep windows closed Avoid exposure to decaying vegetation. Avoid leaf raking. Avoid grain handling. Consider wearing a face mask if working in moldy areas.  Indoor Mold Control Maintain humidity below 50%. Clean washable surfaces with 5% bleach solution. Remove sources e.g. Contaminated carpets.  Control of Dog or Cat Allergen Avoidance is the best way to manage a dog or cat allergy. If you have a dog or cat and are allergic to dog or cats, consider removing the dog or cat from the home. If you have a dog or cat but don't want to find it a new home, or if your family wants a pet even though someone in the household is allergic, here are some strategies that may help keep symptoms at bay:  Keep the pet out of your bedroom and restrict it to only a few rooms. Be advised that keeping the dog or cat in only one room will not limit the allergens to that room. Don't pet, hug or kiss the dog or cat; if you do, wash your hands with soap and water. High-efficiency particulate air (HEPA) cleaners run continuously in a bedroom or living room can reduce allergen levels over time. Regular use of a high-efficiency vacuum cleaner or a central vacuum can reduce allergen levels. Giving your dog or cat a bath at least once a week can reduce airborne allergen.

## 2022-03-07 NOTE — Progress Notes (Signed)
[address] [place] [address] Dept: [phone]  FOLLOW UP NOTE  Patient ID: STCLAIR Bradley [name], male    DOB: 1996/04/11  Age: 26 y.o. MRN: [id number] Date of Office Visit: 03/07/2022  Assessment  Chief Complaint: Asthma and Allergic Rhinitis   HPI Bradley [name] is a 26 year old male who presents to the clinic for a follow up visit. He was last seen in this clinic on 12/22/2021 by Gareth Morgan, FNP, for evaluation of asthma, allergic rhinitis, allergic conjunctivitis, chronic urticaria, reflux, alpha gal allergy, shellfish allergy, and tinea corporis. At today's visit, he reports that his asthma has been well controlled with no shortness of breath, cough, or wheeze with activity or rest. He stopped taking montelukast a few months ago when he had an elevation in his lipase level. He reports that he has not used Alvesco 160 or albuterol for the last 2 months and has not experienced any symptoms of asthma. Allergic rhinitis is reported as well controlled with no symptoms at this time. He continues Xyzal as needed and is not currently using a nasal saline rinse or nasal steroid spray. He did go to Urgent care for poison ivy exposure for which he received a prednisone taper that he is currently taking. Otherwise, he denies urticaria flares. He reports that he also went to Urgent care for intermittent right ear pressure. He reports slight intermittent pain which is beginning to resolve and denies any discharge, dizziness, or change in hearing. Reflux is reported as moderately well controlled with heartburn occurring about 2 times a month for which he takes Tums with resolution of symptoms. He continues to avoid mammalian meat and shellfish with no accidental ingestion or epinephrine auto-injector use since his last visit to this clinic. He reports the tinea corporis has cleared with clotrimazole and has not returned since his last visit to this clinic. His current medications are listed in the chart.     Drug Allergies:  Allergies  Allergen Reactions   Shellfish Allergy Anaphylaxis   Hydrocodone Itching    Pt states he feels itchy after taking, but it doesn't preclude him taking hydrocodone if needed About 2-3 years ago.  It was the first time he took hydrocodone.   Other Diarrhea    Slightly allergic to red meat since tick bite.Marland KitchenMarland KitchenNot anaphylaxis, but GI sensitivity.    Physical Exam: BP 130/88   Pulse 84   Temp 98 F (36.7 C)   Resp 16   Ht '6\' 6"'$  (1.981 m)   Wt (!) 379 lb 1.9 oz (172 kg)   SpO2 98%   BMI 43.81 kg/m    Physical Exam Vitals reviewed.  Constitutional:      Appearance: Normal appearance.  HENT:     Head: Normocephalic and atraumatic.     Right Ear: Tympanic membrane normal.     Left Ear: Tympanic membrane normal.     Nose:     Comments: Bilateral nares slightly erythematous with no nasal drainage noted. Pharynx erythematous with no exudate noted. Bilateral ear canals slightly pink. TM's normal. No effusion noted.  Eyes normal. Eyes:     Conjunctiva/sclera: Conjunctivae normal.  Cardiovascular:     Rate and Rhythm: Normal rate and regular rhythm.     Heart sounds: Normal heart sounds. No murmur heard. Pulmonary:     Effort: Pulmonary effort is normal.     Breath sounds: Normal breath sounds.     Comments: Lungs clear to auscultation Musculoskeletal:  General: Normal range of motion.     Cervical back: Normal range of motion and neck supple.  Skin:    General: Skin is warm and dry.  Neurological:     Mental Status: He is alert and oriented to person, place, and time.  Psychiatric:        Mood and Affect: Mood normal.        Behavior: Behavior normal.        Thought Content: Thought content normal.        Judgment: Judgment normal.    Diagnostics: FVC 6.23, FEV1 5.00. Predicted FVC 6.95, predicted FEV1 5.63. Spirometry indicates normal ventilatory function.  Assessment and Plan: 1. Moderate persistent asthma without complication   2.  Seasonal and perennial allergic rhinoconjunctivitis   3. Gastroesophageal reflux disease, unspecified whether esophagitis present   4. Urticaria   5. Allergy to alpha-gal   6. Anaphylactic shock due to food, subsequent encounter   7. Dysfunction of right eustachian tube     Meds ordered this encounter  Medications   albuterol (VENTOLIN HFA) 108 (90 Base) MCG/ACT inhaler    Sig: Inhale 2 puffs into the lungs every 6 (six) hours as needed for wheezing or shortness of breath.    Dispense:  18 g    Refill:  1    Place on file for when patient needs to refill.   ALVESCO 160 MCG/ACT inhaler    Sig: Inhale 2 puffs into the lungs 2 (two) times daily.    Dispense:  1 each    Refill:  5    Rxbin: [id number] RxPCN: [facility] RxGRP: [id number] Issuer: ([id number]) ID#: [id number]   AUVI-Q 0.3 MG/0.3ML SOAJ injection    Sig: Inject 0.3 mg into the muscle as needed for anaphylaxis.    Dispense:  1 each    Refill:  1   levocetirizine (XYZAL) 5 MG tablet    Sig: Take 1 tablet (5 mg total) by mouth every evening.    Dispense:  30 tablet    Refill:  5    Patient Instructions  Asthma Continue albuterol 2 puffs once every 4 hours as needed for cough or wheeze You may use albuterol 2 puffs 5 to 15 minutes before activity to decrease cough or wheeze For asthma flare, begin Alvesco 160-2 puffs twice a day for 2 weeks or until cough and wheeze free  Allergic rhinitis Continue avoidance measures directed toward mold, cockroach, pollens, dog, and cat as listed below Continue Flonase 2 sprays in each nostril once a day as needed for a stuffy nose.  In the right nostril, point the applicator out toward the right ear. In the left nostril, point the applicator out toward the left ear Continue levocetirizine 5 mg once a day as needed for a runny nose or itch. Remember to rotate to a different antihistamine about every 3 months. Some examples of over the counter antihistamines include Zyrtec (cetirizine), Xyzal  (levocetirizine), Allegra (fexofenadine), and Claritin (loratidine).  Continue Azelastine 2 sprays in each nostril twice a day as needed for a runny nose Consider saline nasal rinses as needed for nasal symptoms. 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 In case of an allergic reaction, take Benadryl 50 mg every 4 hours, and if life-threatening symptoms occur, inject with EpiPen 0.3 mg. We can recheck these levels at your next follow-up visit in about 6 months if you are interested  Food  allergy Continue to avoid shellfish. In case of an allergic reaction, take Benadryl 50 mg every 4 hours, and if life-threatening symptoms occur, inject with AuviQ 0.3 mg.  Hives Take the least amount of medications while remaining hive free Levocetirizine (Xyzal) 5 mg twice a day and famotidine (Pepcid) 20 mg twice a day. If no symptoms for 7-14 days then decrease to. Levocetirizine (Xyzal) 5 mg twice a day and famotidine (Pepcid) 20 mg once a day.  If no symptoms for 7-14 days then decrease to. Levocetirizine (Xyzal) 5 mg twice a day.  If no symptoms for 7-14 days then decrease to. Levocetirizine (Xyzal) 5 mg once a day.   May use Benadryl (diphenhydramine) as needed for breakthrough hives       If symptoms return, then step up dosage Keep a detailed symptom journal including foods eaten, contact with allergens, medications taken, weather changes.   Eustachian tube dysfunction Restart nasal saline rinses followed by nasal steroid such as Flonase or Nasacort  Reflux Continue dietary and lifestyle modifications as you have been  Call the clinic if this treatment plan is not working well for you.  Follow up in 6 months or sooner if needed.   Return in about 6 months (around 09/06/2022), or if symptoms worsen or fail to improve.    Thank you for the opportunity to care for this patient.  Please do not  hesitate to contact me with questions.  Gareth Morgan, FNP [facility]

## 2022-03-22 DIAGNOSIS — L739 Follicular disorder, unspecified: Secondary | ICD-10-CM | POA: Insufficient documentation

## 2022-03-23 ENCOUNTER — Ambulatory Visit (INDEPENDENT_AMBULATORY_CARE_PROVIDER_SITE_OTHER): Payer: 59 | Admitting: Allergy & Immunology

## 2022-03-23 ENCOUNTER — Encounter: Payer: Self-pay | Admitting: Allergy & Immunology

## 2022-03-23 ENCOUNTER — Encounter: Payer: Self-pay | Admitting: Family Medicine

## 2022-03-23 VITALS — BP 132/80 | HR 67 | Temp 98.1°F | Resp 12 | Wt 374.4 lb

## 2022-03-23 DIAGNOSIS — H1013 Acute atopic conjunctivitis, bilateral: Secondary | ICD-10-CM

## 2022-03-23 DIAGNOSIS — K219 Gastro-esophageal reflux disease without esophagitis: Secondary | ICD-10-CM

## 2022-03-23 DIAGNOSIS — J4540 Moderate persistent asthma, uncomplicated: Secondary | ICD-10-CM

## 2022-03-23 DIAGNOSIS — L509 Urticaria, unspecified: Secondary | ICD-10-CM | POA: Diagnosis not present

## 2022-03-23 DIAGNOSIS — J302 Other seasonal allergic rhinitis: Secondary | ICD-10-CM | POA: Diagnosis not present

## 2022-03-23 DIAGNOSIS — Z91018 Allergy to other foods: Secondary | ICD-10-CM

## 2022-03-23 DIAGNOSIS — H1010 Acute atopic conjunctivitis, unspecified eye: Secondary | ICD-10-CM

## 2022-03-23 NOTE — Patient Instructions (Addendum)
Asthma Lung testing looked normal.  Continue albuterol 2 puffs once every 4 hours as needed for cough or wheeze You may use albuterol 2 puffs 5 to 15 minutes before activity to decrease cough or wheeze For asthma flare, begin Alvesco 160-2 puffs twice a day for 2 weeks or until cough and wheeze free . Allergic rhinitis Continue avoidance measures directed toward mold, cockroach, pollens, dog, and cat as listed below Continue Flonase 2 sprays in each nostril once a day as needed for a stuffy nose.  In the right nostril, point the applicator out toward the right ear. In the left nostril, point the applicator out toward the left ear Continue levocetirizine 5 mg once a day as needed for a runny nose or itch. Remember to rotate to a different antihistamine about every 3 months. Some examples of over the counter antihistamines include Zyrtec (cetirizine), Xyzal (levocetirizine), Allegra (fexofenadine), and Claritin (loratidine).  Continue Azelastine 2 sprays in each nostril twice a day as needed for a runny nose Consider saline nasal rinses as needed for nasal symptoms. 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 In case of an allergic reaction, take Benadryl 50 mg every 4 hours, and if life-threatening symptoms occur, inject with EpiPen 0.3 mg. We can recheck these levels at your next follow-up visit in about 6 months if you are interested  Food allergy Continue to avoid shellfish. In case of an allergic reaction, take Benadryl 50 mg every 4 hours, and if life-threatening symptoms occur, inject with AuviQ 0.3 mg.  Eustachian tube dysfunction Restart nasal saline rinses followed by nasal steroid such as Flonase or Nasacort  Reflux Continue dietary and lifestyle modifications as you have been  Call the clinic if this treatment plan is not working well for you.  Follow up in 6 months or sooner if needed.

## 2022-03-23 NOTE — Progress Notes (Signed)
FOLLOW UP  Date of Service/Encounter:  03/23/22   Assessment:   Moderate persistent asthma without complication  Seasonal and perennial allergic rhinoconjunctivitis  Urticaria on back - now resolved  Gastroesophageal reflux disease   Allergy to alpha-gal  Plan/Recommendations:   Asthma Lung testing looked normal.  Continue albuterol 2 puffs once every 4 hours as needed for cough or wheeze You may use albuterol 2 puffs 5 to 15 minutes before activity to decrease cough or wheeze For asthma flare, begin Alvesco 160-2 puffs twice a day for 2 weeks or until cough and wheeze free . Allergic rhinitis Continue avoidance measures directed toward mold, cockroach, pollens, dog, and cat as listed below Continue Flonase 2 sprays in each nostril once a day as needed for a stuffy nose.  In the right nostril, point the applicator out toward the right ear. In the left nostril, point the applicator out toward the left ear Continue levocetirizine 5 mg once a day as needed for a runny nose or itch. Remember to rotate to a different antihistamine about every 3 months. Some examples of over the counter antihistamines include Zyrtec (cetirizine), Xyzal (levocetirizine), Allegra (fexofenadine), and Claritin (loratidine).  Continue Azelastine 2 sprays in each nostril twice a day as needed for a runny nose Consider saline nasal rinses as needed for nasal symptoms. 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 In case of an allergic reaction, take Benadryl 50 mg every 4 hours, and if life-threatening symptoms occur, inject with EpiPen 0.3 mg. We can recheck these levels at your next follow-up visit in about 6 months if you are interested  Food allergy Continue to avoid shellfish. In case of an allergic reaction, take Benadryl 50 mg every 4 hours, and if life-threatening symptoms occur, inject with AuviQ 0.3  mg.  Eustachian tube dysfunction Restart nasal saline rinses followed by nasal steroid such as Flonase or Nasacort  Reflux Continue dietary and lifestyle modifications as you have been  Call the clinic if this treatment plan is not working well for you.  Follow up in 6 months or sooner if needed.   Subjective:   Bradley [name] is a 26 y.o. male presenting today for follow up of  Chief Complaint  Patient presents with   Urticaria    Reaction yesterday cause unknown on his back. Hasn't happened like this in years.   Asthma    fine   Allergic Rhinitis     fine    Bradley [name] has a history of the following: Patient Active Problem List   Diagnosis Date Noted   Dysfunction of right eustachian tube 03/07/2022   Moderate persistent asthma without complication 09/47/0962   Allergy to alpha-gal 06/16/2021   Right ankle pain 07/06/2020   Dysphagia    Gastroesophageal reflux disease 05/19/2020   Urticaria 05/18/2020   Seasonal and perennial allergic rhinoconjunctivitis 04/19/2020   Mild persistent asthma, uncomplicated 83/66/2947   Low back pain 12/18/2019   Shortness of breath 10/15/2019   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   Hamstring injury, right, initial encounter 01/23/2019   Migraine without aura and without status migrainosus, not intractable 07/10/2018   Morbid obesity with BMI of 40.0-44.9, adult ([place]) 07/10/2018   Mild intermittent asthma without complication 65/46/5035   Atypical chest pain 12/21/2017   Generalized abdominal pain 12/17/2017   Anxiety 11/28/2017    History obtained from: chart review and patient.  Bradley [name] is a 26 y.o. male presenting for a follow up visit.  He was last seen in June 2023 just a couple weeks ago by one of the nurse practitioners.  At that time, he was continued on albuterol and started on Alvesco 160 mcg 2 puffs twice daily during flares.  For his allergic rhinitis, he was continued on Flonase as well as Xyzal  and Astelin.  He has a history of alpha gal allergy and continued avoidance of red meats was recommended.  He also continue to avoid shellfish. He saw [name] as a regular follow up.   Since last visit, he has not done well. He noticed a spot on his back yesterday. He applied hydrocortisone and it was gone this morning. He had some questions about what triggers the hives. This was just isolated. He denies any bug bites at all. 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 He does not work outside at all. He does not feel it any longer. No one else is getting those in the family or at work. He denies any bites from any stinging insects.   He did have some isolated episode of a hive when he was aged 89. He noticed that he had 3-4 that were fairly large and they went away on their own. He thought that this was linked to a laundry detergent. He is using Gain right now  and he has been on that for a while. He cannot think of anything that he changed that would have caused this.   He has avoided shellfish and red meat. He denies any exposure to these items at all. He has not needed to use his epinephrine injector at all.   He has had GI issues including polyps for his entire life. He has had a colonoscopy that resulted nothing excited. He did have elevated lipase levels, which eventually were thought to be due to the montelukast. Lipase was normal most recently. Montelukast was stopped two months ago.   Otherwise, there have been no changes to his past medical history, surgical history, family history, or social history.    Review of Systems  Constitutional: Negative.  Negative for chills, fever, malaise/fatigue and weight loss.  HENT: Negative.  Negative for congestion, ear discharge, ear pain and sinus pain.   Eyes:  Negative for pain, discharge and redness.  Respiratory:  Negative for cough, sputum production, shortness of breath, wheezing and stridor.   Cardiovascular: Negative.  Negative for chest pain  and palpitations.  Gastrointestinal:  Negative for abdominal pain, constipation, diarrhea, heartburn, nausea and vomiting.  Skin:  Positive for itching and rash.  Neurological:  Negative for dizziness and headaches.  Endo/Heme/Allergies:  Positive for environmental allergies. Does not bruise/bleed easily.       Objective:   Blood pressure 132/80, pulse 67, temperature 98.1 F (36.7 C), temperature source Temporal, resp. rate 12, weight (!) 374 lb 6.4 oz (169.8 kg), SpO2 98 %. Body mass index is 43.27 kg/m.    Physical Exam Constitutional:      Appearance: He is well-developed.     Comments: Friendly and talkative.   HENT:     Head: Normocephalic and atraumatic.     Right Ear: Tympanic membrane, ear canal and external ear normal.     Left Ear: Tympanic membrane, ear canal and external ear normal.     Nose: No nasal deformity, septal deviation, mucosal edema or rhinorrhea.     Right Turbinates: Not enlarged or swollen.     Left  Turbinates: Not enlarged or swollen.     Right Sinus: No maxillary sinus tenderness or frontal sinus tenderness.     Left Sinus: No maxillary sinus tenderness or frontal sinus tenderness.     Mouth/Throat:     Mouth: Mucous membranes are not pale and not dry.     Pharynx: Uvula midline.  Eyes:     General: Lids are normal. No allergic shiner.       Right eye: No discharge.        Left eye: No discharge.     Conjunctiva/sclera: Conjunctivae normal.     Right eye: Right conjunctiva is not injected. No chemosis.    Left eye: Left conjunctiva is not injected. No chemosis.    Pupils: Pupils are equal, round, and reactive to light.  Cardiovascular:     Rate and Rhythm: Normal rate and regular rhythm.     Heart sounds: Normal heart sounds.  Pulmonary:     Effort: Pulmonary effort is normal. No tachypnea, accessory muscle usage or respiratory distress.     Breath sounds: Normal breath sounds. No wheezing, rhonchi or rales.  Chest:     Chest wall: No  tenderness.  Lymphadenopathy:     Cervical: No cervical adenopathy.  Skin:    General: Skin is warm.     Capillary Refill: Capillary refill takes less than 2 seconds.     Coloration: Skin is not pale.     Findings: No abrasion, erythema, petechiae or rash. Rash is not papular, urticarial or vesicular.     Comments: No urticaria noted on his back as shown in the picture from his MyChart message yesterday.   Neurological:     Mental Status: He is alert.  Psychiatric:        Behavior: Behavior is cooperative.      Diagnostic studies:    Spirometry: results normal (FEV1: 5.09/90%, FVC: 6.18/89%, FEV1/FVC: 82%).    Spirometry consistent with normal pattern.   Allergy Studies: none       Salvatore Marvel, MD  [facility]

## 2022-03-23 NOTE — Telephone Encounter (Signed)
Great that is resolved with the cream. Can you please ask if this raised area was itchy?

## 2022-03-24 ENCOUNTER — Encounter: Payer: Self-pay | Admitting: *Deleted

## 2022-03-24 ENCOUNTER — Emergency Department
Admission: EM | Admit: 2022-03-24 | Discharge: 2022-03-24 | Disposition: A | Discharge status: Home / Self Care | Payer: 59 | Source: Home / Self Care

## 2022-03-24 DIAGNOSIS — L509 Urticaria, unspecified: Secondary | ICD-10-CM

## 2022-03-24 NOTE — ED Triage Notes (Signed)
Patient report Wednesday night "hives" swollen area to upper back. He saw his allergist yesterday but at the apt the area had resolved. Last night it returned. Denies itching.

## 2022-03-24 NOTE — ED Provider Notes (Signed)
[name]    CSN: [id number] Arrival date & time: 03/24/22  1003      History   Chief Complaint Chief Complaint  Patient presents with   Rash    Upper back    HPI Bradley [name] is a 26 y.o. male.   HPI 26 year old male presents with a rash of upper back.  Patient reports non-specific rash is hives/urticaria.  Patient has history of urticaria and reports being evaluated by his allergist yesterday; however, area had resolved completely then returned last night.  Patient presents at [facility] urgent care on 02/26/2022 was treated with IM steroids/p.o. steroids for rhus dermatitis, then evaluated by his PCP first on 03/01/2022 for contact dermatitis evaluation.  Past Medical History:  Diagnosis Date   Allergy to alpha-gal    Anxiety    Chronic headaches    Clavicular fracture    Diverticulosis    History of chickenpox    Mild intermittent asthma 06/07/2018   Shingles    26 years old.   Tubular adenoma of colon    Urticaria 05/18/2020    Patient Active Problem List   Diagnosis Date Noted   Dysfunction of right eustachian tube 03/07/2022   Moderate persistent asthma without complication 52/77/8242   Allergy to alpha-gal 06/16/2021   Right ankle pain 07/06/2020   Dysphagia    Gastroesophageal reflux disease 05/19/2020   Urticaria 05/18/2020   Seasonal and perennial allergic rhinoconjunctivitis 04/19/2020   Mild persistent asthma, uncomplicated 35/36/1443   Low back pain 12/18/2019   Shortness of breath 10/15/2019   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   Hamstring injury, right, initial encounter 01/23/2019   Migraine without aura and without status migrainosus, not intractable 07/10/2018   Morbid obesity with BMI of 40.0-44.9, adult ([place]) 07/10/2018   Mild intermittent asthma without complication 15/40/0867   Atypical chest pain 12/21/2017   Generalized abdominal pain 12/17/2017   Anxiety 11/28/2017    Past Surgical History:   Procedure Laterality Date   COLONOSCOPY     CYSTECTOMY  01/2019   scrotum area    ESOPHAGEAL MANOMETRY N/A 06/09/2020   Procedure: ESOPHAGEAL MANOMETRY (EM);  Surgeon: Lavena Bullion, DO;  Location: [facility];  Service: Gastroenterology;  Laterality: N/A;   ESOPHAGOGASTRODUODENOSCOPY     shoulder surgery Right 08/26/2019   WISDOM TOOTH EXTRACTION         Home Medications    Prior to Admission medications   Medication Sig Start Date End Date Taking? Authorizing Provider  albuterol (VENTOLIN HFA) 108 (90 Base) MCG/ACT inhaler Inhale 2 puffs into the lungs every 6 (six) hours as needed for wheezing or shortness of breath. 03/07/22   Dara Hoyer, FNP  ALVESCO 160 MCG/ACT inhaler Inhale 2 puffs into the lungs 2 (two) times daily. 03/07/22   Ambs, Kathrine Cords, FNP  AMBULATORY NON FORMULARY MEDICATION Diltiazem gel 2% with lidocaine  Apply a pea sized amount into your rectum three times daily as needed Dispense 30 GM zero refill 01/04/22   Vladimir Crofts, PA-C  AUVI-Q 0.3 MG/0.3ML SOAJ injection Inject 0.3 mg into the muscle as needed for anaphylaxis. 03/07/22   Ambs, Kathrine Cords, FNP  citalopram (CELEXA) 20 MG tablet TAKE 1 TABLET(20 MG) BY MOUTH DAILY 12/12/21   Burchette, Alinda Sierras, MD  levocetirizine (XYZAL) 5 MG tablet Take 1 tablet (5 mg total) by mouth every evening. 03/07/22   Ambs, Kathrine Cords, FNP  NURTEC 75 MG TBDP Take 1 tablet by mouth daily  as needed. 09/01/21   [provider]  ondansetron (ZOFRAN-ODT) 8 MG disintegrating tablet Take 1 tablet (8 mg total) by mouth every 8 (eight) hours as needed for nausea or vomiting. 12/30/21   Eliezer Lofts, FNP  RESTASIS 0.05 % ophthalmic emulsion 1 drop 2 (two) times daily. 08/01/21   [provider]  sildenafil (VIAGRA) 100 MG tablet TAKE 1/2 TO 1 TABLET(50 TO 100 MG) BY MOUTH DAILY AS NEEDED FOR ERECTILE DYSFUNCTION 01/02/22   Burchette, Alinda Sierras, MD    Family History Family History  Problem Relation Age of Onset   Hypertension  Mother    Colon polyps Mother        might be mistakrn   Diabetes Father        type II   Migraines Sister    Colon cancer Neg Hx    Esophageal cancer Neg Hx    Rectal cancer Neg Hx    Stomach cancer Neg Hx     Social History Social History   Tobacco Use   Smoking status: Never   Smokeless tobacco: Never  Vaping Use   Vaping Use: Never used  Substance Use Topics   Alcohol use: Yes    Alcohol/week: 3.0 standard drinks of alcohol    Types: 3 Standard drinks or equivalent per week    Comment: every other week. not a big drinker per patient   Drug use: Yes    Types: Marijuana    Comment: rare use     Allergies   Shellfish allergy, Hydrocodone, and Other   Review of Systems Review of Systems  Skin:  Positive for rash.  All other systems reviewed and are negative.    Physical Exam Triage Vital Signs ED Triage Vitals  Enc Vitals Group     BP 03/24/22 1016 123/83     Pulse Rate 03/24/22 1016 73     Resp 03/24/22 1016 18     Temp 03/24/22 1016 98.5 F (36.9 C)     Temp Source 03/24/22 1016 Oral     SpO2 03/24/22 1016 98 %     Weight --      Height --      Head Circumference --      Peak Flow --      Pain Score 03/24/22 1017 3     Pain Loc --      Pain Edu? --      Excl. in [place]? --    No data found.  Updated Vital Signs BP 123/83 (BP Location: Left Arm)   Pulse 73   Temp 98.5 F (36.9 C) (Oral)   Resp 18   SpO2 98%    Physical Exam Vitals and nursing note reviewed.  Constitutional:      Appearance: He is normal weight.  HENT:     Head: Normocephalic and atraumatic.     Mouth/Throat:     Mouth: Mucous membranes are moist.     Pharynx: Oropharynx is clear.  Eyes:     Extraocular Movements: Extraocular movements intact.     Conjunctiva/sclera: Conjunctivae normal.     Pupils: Pupils are equal, round, and reactive to light.  Cardiovascular:     Rate and Rhythm: Normal rate and regular rhythm.     Pulses: Normal pulses.     Heart sounds: Normal  heart sounds.  Pulmonary:     Effort: Pulmonary effort is normal.     Breath sounds: Normal breath sounds. No wheezing, rhonchi or rales.  Musculoskeletal:  Cervical back: Normal range of motion and neck supple.  Skin:    General: Skin is warm and dry.     Comments: Upper back (superior central aspect): please see image inserted below  Neurological:     General: No focal deficit present.     Mental Status: He is alert and oriented to person, place, and time.       UC Treatments / Results  Labs (all labs ordered are listed, but only abnormal results are displayed) Labs Reviewed - No data to display  EKG   Radiology No results found.  Procedures Procedures (including critical care time)  Medications Ordered in UC Medications - No data to display  Initial Impression / Assessment and Plan / UC Course  I have reviewed the triage vital signs and the nursing notes.  Pertinent labs & imaging results that were available during my care of the patient were reviewed by me and considered in my medical decision making (see chart for details).     MDM: 1.  Urticaria-Advised patient if urticaria/hives like rash evolves of upper back please take OTC Allegra 180 mg 2 pills daily for the next 3 days.  Advised patient if symptoms worsen and/or unresolved please have PCP refer to new dermatologist for further evaluation of this area/frequent eruptions of urticaria. Discharged home, hemodynamically stable.  Final Clinical Impressions(s) / UC Diagnoses   Final diagnoses:  Urticaria     Discharge Instructions      Advised patient if urticaria/hives like rash evolves of upper back please take OTC Allegra 180 mg 2 pills daily for the next 3 days.  Advised patient if symptoms worsen and/or unresolved please have PCP refer to new dermatologist for further evaluation of this area/frequent eruptions of urticaria.     ED Prescriptions   None    PDMP not reviewed this encounter.    Eliezer Lofts, [place] 03/24/22 1104

## 2022-03-24 NOTE — Discharge Instructions (Addendum)
Advised patient if urticaria/hives like rash evolves of upper back please take OTC Allegra 180 mg 2 pills daily for the next 3 days.  Advised patient if symptoms worsen and/or unresolved please have PCP refer to new dermatologist for further evaluation of this area/frequent eruptions of urticaria.

## 2022-03-26 ENCOUNTER — Encounter: Payer: Self-pay | Admitting: Allergy & Immunology

## 2022-04-27 ENCOUNTER — Ambulatory Visit: Payer: 59 | Admitting: Family Medicine

## 2022-04-27 ENCOUNTER — Ambulatory Visit (INDEPENDENT_AMBULATORY_CARE_PROVIDER_SITE_OTHER): Payer: 59 | Admitting: Internal Medicine

## 2022-04-27 ENCOUNTER — Encounter: Payer: Self-pay | Admitting: Internal Medicine

## 2022-04-27 VITALS — BP 124/78 | HR 85 | Temp 98.2°F | Ht 78.0 in | Wt 372.8 lb

## 2022-04-27 DIAGNOSIS — H9201 Otalgia, right ear: Secondary | ICD-10-CM | POA: Diagnosis not present

## 2022-04-27 NOTE — Progress Notes (Signed)
Acute office Visit     CC/Reason for Visit: Right ear pain  HPI: Bradley [name] is a 26 y.o. male who is coming in today for the above mentioned reasons.  For 5 days he has been having pain of his right ear.  No recent URI symptoms.  3 days ago he visited urgent care for a paronychia and was prescribed cephalexin.  He has noted significant improvement in his ear pain since starting the antibiotic.  Past Medical/Surgical History: Past Medical History:  Diagnosis Date   Allergy to alpha-gal    Anxiety    Chronic headaches    Clavicular fracture    Diverticulosis    History of chickenpox    Mild intermittent asthma 06/07/2018   Shingles    26 years old.   Tubular adenoma of colon    Urticaria 05/18/2020    Past Surgical History:  Procedure Laterality Date   COLONOSCOPY     CYSTECTOMY  01/2019   scrotum area    ESOPHAGEAL MANOMETRY N/A 06/09/2020   Procedure: ESOPHAGEAL MANOMETRY (EM);  Surgeon: Lavena Bullion, DO;  Location: [facility];  Service: Gastroenterology;  Laterality: N/A;   ESOPHAGOGASTRODUODENOSCOPY     shoulder surgery Right 08/26/2019   WISDOM TOOTH EXTRACTION      Social History:  reports that he has never smoked. He has never used smokeless tobacco. He reports current alcohol use of about 3.0 standard drinks of alcohol per week. He reports current drug use. Drug: Marijuana.  Allergies: Allergies  Allergen Reactions   Shellfish Allergy Anaphylaxis   Hydrocodone Itching    Pt states he feels itchy after taking, but it doesn't preclude him taking hydrocodone if needed About 2-3 years ago.  It was the first time he took hydrocodone.   Other Diarrhea    Slightly allergic to red meat since tick bite.Marland KitchenMarland KitchenNot anaphylaxis, but GI sensitivity.    Family History:  Family History  Problem Relation Age of Onset   Hypertension Mother    Colon polyps Mother        might be mistakrn   Diabetes Father        type II   Migraines Sister    Colon cancer  Neg Hx    Esophageal cancer Neg Hx    Rectal cancer Neg Hx    Stomach cancer Neg Hx      Current Outpatient Medications:    albuterol (VENTOLIN HFA) 108 (90 Base) MCG/ACT inhaler, Inhale 2 puffs into the lungs every 6 (six) hours as needed for wheezing or shortness of breath., Disp: 18 g, Rfl: 1   ALVESCO 160 MCG/ACT inhaler, Inhale 2 puffs into the lungs 2 (two) times daily., Disp: 1 each, Rfl: 5   AMBULATORY NON FORMULARY MEDICATION, Diltiazem gel 2% with lidocaine  Apply a pea sized amount into your rectum three times daily as needed Dispense 30 GM zero refill, Disp: 30 g, Rfl: 0   AUVI-Q 0.3 MG/0.3ML SOAJ injection, Inject 0.3 mg into the muscle as needed for anaphylaxis., Disp: 1 each, Rfl: 1   citalopram (CELEXA) 20 MG tablet, TAKE 1 TABLET(20 MG) BY MOUTH DAILY, Disp: 90 tablet, Rfl: 0   levocetirizine (XYZAL) 5 MG tablet, Take 1 tablet (5 mg total) by mouth every evening., Disp: 30 tablet, Rfl: 5   NURTEC 75 MG TBDP, Take 1 tablet by mouth daily as needed., Disp: , Rfl:    ondansetron (ZOFRAN-ODT) 8 MG disintegrating tablet, Take 1 tablet (8 mg total) by mouth  every 8 (eight) hours as needed for nausea or vomiting., Disp: 30 tablet, Rfl: 0   RESTASIS 0.05 % ophthalmic emulsion, 1 drop 2 (two) times daily., Disp: , Rfl:    sildenafil (VIAGRA) 100 MG tablet, TAKE 1/2 TO 1 TABLET(50 TO 100 MG) BY MOUTH DAILY AS NEEDED FOR ERECTILE DYSFUNCTION, Disp: 10 tablet, Rfl: 2  Review of Systems:  Constitutional: Denies fever, chills, diaphoresis, appetite change and fatigue.  HEENT: Denies photophobia, eye pain, redness, hearing loss,  congestion, sore throat, rhinorrhea, sneezing, mouth sores, trouble swallowing, neck pain, neck stiffness and tinnitus.   Respiratory: Denies SOB, DOE, cough, chest tightness,  and wheezing.   Cardiovascular: Denies chest pain, palpitations and leg swelling.  Gastrointestinal: Denies nausea, vomiting, abdominal pain, diarrhea, constipation, blood in stool and  abdominal distention.  Genitourinary: Denies dysuria, urgency, frequency, hematuria, flank pain and difficulty urinating.  Endocrine: Denies: hot or cold intolerance, sweats, changes in hair or nails, polyuria, polydipsia. Musculoskeletal: Denies myalgias, back pain, joint swelling, arthralgias and gait problem.  Skin: Denies pallor, rash and wound.  Neurological: Denies dizziness, seizures, syncope, weakness, light-headedness, numbness and headaches.  Hematological: Denies adenopathy. Easy bruising, personal or family bleeding history  Psychiatric/Behavioral: Denies suicidal ideation, mood changes, confusion, nervousness, sleep disturbance and agitation    Physical Exam: Vitals:   04/27/22 1043  BP: 124/78  Pulse: 85  Temp: 98.2 F (36.8 C)  TempSrc: Oral  SpO2: 98%  Weight: (!) 372 lb 12.8 oz (169.1 kg)  Height: '6\' 6"'$  (1.981 m)    Body mass index is 43.08 kg/m.   Constitutional: NAD, calm, comfortable, obese Eyes: PERRL, lids and conjunctivae normal, wears corrective lenses ENMT: Mucous membranes are moist. Posterior pharynx is erythematous but clear of any exudate or lesions. Normal dentition. Tympanic membrane is pearly white, no erythema or bulging.  Psychiatric: Normal judgment and insight. Alert and oriented x 3. Normal mood.    Impression and Plan:  Right ear pain -No signs of otitis media.  Continue cephalexin as prescribed for paronychia.  Have advised daily use of antihistamine and guaifenesin.   Time spent:21 minutes reviewing chart, interviewing and examining patient and formulating plan of care.    Lelon Frohlich, MD [facility]

## 2022-05-03 ENCOUNTER — Encounter: Payer: Self-pay | Admitting: Family

## 2022-05-03 ENCOUNTER — Ambulatory Visit: Payer: 59 | Admitting: Family

## 2022-05-03 VITALS — BP 127/90 | HR 64 | Temp 98.1°F | Ht 78.0 in | Wt 380.0 lb

## 2022-05-03 DIAGNOSIS — R21 Rash and other nonspecific skin eruption: Secondary | ICD-10-CM | POA: Diagnosis not present

## 2022-05-03 DIAGNOSIS — H9203 Otalgia, bilateral: Secondary | ICD-10-CM

## 2022-05-03 NOTE — Progress Notes (Signed)
Patient ID: Bradley [name], male    DOB: 04-30-1996, 26 y.o.   MRN: [id number]  Chief Complaint  Patient presents with   Insect Bite    Pt states last Thursday, went fishing and had bumps all over. Went to urgent care on last Friday and prednisone shot and a z pack. Pt states it helped it but he still has spots. Slight ithcing, redness on legs, arms bottom, lower back. Believes it is chiggers.    Ear Pain    Pt c/o ear pain, right hurts worse than the left. Pain has been going on for months.     HPI: Dermatitis: reports itchy bumps all over on bilateral legs, lower arms, lower back, thinks he was bitten by chiggers. Seen in UC last Friday and given steroid shot, oral pack, and cream. Reports it is better, told to follow up with PCP.  Ear pain - pt describes a   several months  history of Bilateral ear pain with no fever, no pressure and fullness, no drainage. Denies associated hearing loss, discharge, or dizziness. Reports right ear worse than left.   Assessment & Plan:  1. Skin rash improving, still taking prednisone pack, RX was for 12 days, took 6 pills today, advised to go down to 5, then 4, then 2 and stop if rash resolved. Advised to use the steroid cream on itchier lesions.  2. Acute ear pain, bilateral right > left, has had checked several times and told no infection. None seen today, mild irritation in right ear canal, pt denies using any Qtips, or external ear pain, no swimming.  Reports pain intermittently, not just when lying down, advised it may be a swollen auricular lymph node that radiates to his ear, which can inflame d/t allergies, cut in the skin, current rash if located on his head. OK to take up to '600mg'$  Ibuprofen prn for pain.   Subjective:    Outpatient Medications Prior to Visit  Medication Sig Dispense Refill   albuterol (VENTOLIN HFA) 108 (90 Base) MCG/ACT inhaler Inhale 2 puffs into the lungs every 6 (six) hours as needed for wheezing or shortness of breath.  18 g 1   ALVESCO 160 MCG/ACT inhaler Inhale 2 puffs into the lungs 2 (two) times daily. 1 each 5   AMBULATORY NON FORMULARY MEDICATION Diltiazem gel 2% with lidocaine  Apply a pea sized amount into your rectum three times daily as needed Dispense 30 GM zero refill 30 g 0   AUVI-Q 0.3 MG/0.3ML SOAJ injection Inject 0.3 mg into the muscle as needed for anaphylaxis. 1 each 1   citalopram (CELEXA) 20 MG tablet TAKE 1 TABLET(20 MG) BY MOUTH DAILY 90 tablet 0   levocetirizine (XYZAL) 5 MG tablet Take 1 tablet (5 mg total) by mouth every evening. 30 tablet 5   NURTEC 75 MG TBDP Take 1 tablet by mouth daily as needed.     ondansetron (ZOFRAN-ODT) 8 MG disintegrating tablet Take 1 tablet (8 mg total) by mouth every 8 (eight) hours as needed for nausea or vomiting. 30 tablet 0   RESTASIS 0.05 % ophthalmic emulsion 1 drop 2 (two) times daily.     sildenafil (VIAGRA) 100 MG tablet TAKE 1/2 TO 1 TABLET(50 TO 100 MG) BY MOUTH DAILY AS NEEDED FOR ERECTILE DYSFUNCTION 10 tablet 2   No facility-administered medications prior to visit.   Past Medical History:  Diagnosis Date   Allergy to alpha-gal    Anxiety    Chronic headaches  Clavicular fracture    Diverticulosis    History of chickenpox    Mild intermittent asthma 06/07/2018   Shingles    26 years old.   Tubular adenoma of colon    Urticaria 05/18/2020   Past Surgical History:  Procedure Laterality Date   COLONOSCOPY     CYSTECTOMY  01/2019   scrotum area    ESOPHAGEAL MANOMETRY N/A 06/09/2020   Procedure: ESOPHAGEAL MANOMETRY (EM);  Surgeon: Lavena Bullion, DO;  Location: [facility];  Service: Gastroenterology;  Laterality: N/A;   ESOPHAGOGASTRODUODENOSCOPY     shoulder surgery Right 08/26/2019   WISDOM TOOTH EXTRACTION     Allergies  Allergen Reactions   Shellfish Allergy Anaphylaxis   Hydrocodone Itching    Pt states he feels itchy after taking, but it doesn't preclude him taking hydrocodone if needed About 2-3 years ago.   It was the first time he took hydrocodone.   Other Diarrhea    Slightly allergic to red meat since tick bite.Marland KitchenMarland KitchenNot anaphylaxis, but GI sensitivity.      Objective:    Physical Exam Vitals and nursing note reviewed.  Constitutional:      General: He is not in acute distress.    Appearance: Normal appearance. He is obese.  HENT:     Head: Normocephalic.     Right Ear: Tympanic membrane normal. Tenderness (mild tenderness & mild erythema in canal) present.     Left Ear: Tympanic membrane and ear canal normal.  Cardiovascular:     Rate and Rhythm: Normal rate and regular rhythm.  Pulmonary:     Effort: Pulmonary effort is normal.     Breath sounds: Normal breath sounds.  Musculoskeletal:        General: Normal range of motion.     Cervical back: Normal range of motion.  Lymphadenopathy:     Head:     Right side of head: No preauricular or posterior auricular adenopathy.     Left side of head: No preauricular or posterior auricular adenopathy.     Cervical: No cervical adenopathy.  Skin:    General: Skin is warm and dry.     Findings: Rash (bilateral lower arms, legs, red pinpoint bumps/scabs, no erythema or swelling noted) present. Rash is urticarial.  Neurological:     Mental Status: He is alert and oriented to person, place, and time.  Psychiatric:        Mood and Affect: Mood normal.    BP (!) 127/90 (BP Location: Left Arm, Patient Position: Sitting, Cuff Size: Large)   Pulse 64   Temp 98.1 F (36.7 C) (Temporal)   Ht '6\' 6"'$  (1.981 m)   Wt (!) 380 lb (172.4 kg)   SpO2 99%   BMI 43.91 kg/m  Wt Readings from Last 3 Encounters:  05/03/22 (!) 380 lb (172.4 kg)  04/27/22 (!) 372 lb 12.8 oz (169.1 kg)  03/23/22 (!) 374 lb 6.4 oz (169.8 kg)       Jeanie Sewer, NP

## 2022-05-10 ENCOUNTER — Ambulatory Visit: Payer: 59 | Admitting: Family Medicine

## 2022-05-10 ENCOUNTER — Encounter: Payer: Self-pay | Admitting: Family Medicine

## 2022-05-10 VITALS — BP 132/90 | HR 80 | Temp 98.0°F | Ht 78.0 in | Wt 367.2 lb

## 2022-05-10 DIAGNOSIS — R079 Chest pain, unspecified: Secondary | ICD-10-CM

## 2022-05-10 DIAGNOSIS — R21 Rash and other nonspecific skin eruption: Secondary | ICD-10-CM

## 2022-05-10 NOTE — Progress Notes (Unsigned)
Established Patient Office Visit  Subjective   Patient ID: Bradley [name], male    DOB: [birth year]  Age: 26 y.o. MRN: [id number]  Chief Complaint  Patient presents with   Rash    Patient complains of rash, x2 weeks     HPI  {History (Optional):23778} Bradley [name] seen for the following items  At least 2-week history of pruritic rash mostly extremities.  [place] and thinks these may have been chigger bites.  He initially went to urgent care week ago last Friday and received steroid injection along with oral steroid taper and steroid cream.  He was then seen a week ago today at another primary care clinic with our group and basically continued on prednisone taper.  Was doing well but after stopping prednisone yesterday has had a few slightly pruritic lesions on his forearms.  No fever.  Patient relates when sitting in waiting room he felt some tightness in his chest.  He wondered if this may be related to heartburn.  He has had cardiac work-up in the past unrevealing.  No recent exertional chest pains.  No associated nausea or vomiting.  No left arm symptoms.  Had little bit of diaphoresis past couple days which he attributes to the prednisone.  No dyspnea.  Still has mild sensation of tightness currently.  Past Medical History:  Diagnosis Date   Allergy to alpha-gal    Anxiety    Chronic headaches    Clavicular fracture    Diverticulosis    History of chickenpox    Mild intermittent asthma 06/07/2018   Shingles    26 years old.   Tubular adenoma of colon    Urticaria 05/18/2020   Past Surgical History:  Procedure Laterality Date   COLONOSCOPY     CYSTECTOMY  01/2019   scrotum area    ESOPHAGEAL MANOMETRY N/A 06/09/2020   Procedure: ESOPHAGEAL MANOMETRY (EM);  Surgeon: Lavena Bullion, DO;  Location: [facility];  Service: Gastroenterology;  Laterality: N/A;   ESOPHAGOGASTRODUODENOSCOPY     shoulder surgery Right  08/26/2019   WISDOM TOOTH EXTRACTION      reports that he has never smoked. He has never used smokeless tobacco. He reports current alcohol use of about 3.0 standard drinks of alcohol per week. He reports current drug use. Drug: Marijuana. family history includes Colon polyps in his mother; Diabetes in his father; Hypertension in his mother; Migraines in his sister. Allergies  Allergen Reactions   Shellfish Allergy Anaphylaxis   Hydrocodone Itching    Pt states he feels itchy after taking, but it doesn't preclude him taking hydrocodone if needed About 2-3 years ago.  It was the first time he took hydrocodone.   Other Diarrhea    Slightly allergic to red meat since tick bite.Marland KitchenMarland KitchenNot anaphylaxis, but GI sensitivity.    Review of Systems  Constitutional:  Negative for chills and fever.  Respiratory:  Negative for cough and shortness of breath.   Cardiovascular:  Positive for chest pain. Negative for palpitations and leg swelling.  Gastrointestinal:  Negative for abdominal pain, nausea and vomiting.  Skin:  Positive for rash.      Objective:     BP (!) 132/90 (BP Location: Left Arm, Cuff Size: Large)   Pulse 80   Temp 98 F (36.7 C) (Oral)   Ht '6\' 6"'$  (1.981 m)   Wt (!) 367 lb 3.2 oz (166.6 kg)   SpO2 98%  BMI 42.43 kg/m  {Vitals History (Optional):23777}  Physical Exam Vitals reviewed.  Constitutional:      Appearance: He is well-developed.  HENT:     Right Ear: External ear normal.     Left Ear: External ear normal.  Eyes:     Pupils: Pupils are equal, round, and reactive to light.  Neck:     Thyroid: No thyromegaly.  Cardiovascular:     Rate and Rhythm: Normal rate and regular rhythm.  Pulmonary:     Effort: Pulmonary effort is normal. No respiratory distress.     Breath sounds: Normal breath sounds. No wheezing or rales.  Musculoskeletal:     Cervical back: Neck supple.  Skin:    Findings: Rash present.     Comments: He has a couple small punctate slightly  raised lesions on his forearms which look like probable bites.  No pustules.  No vesicles.  Few of these are slightly excoriated.  Neurological:     Mental Status: He is alert and oriented to person, place, and time.      No results found for any visits on 05/10/22.  {Labs (Optional):23779}  The ASCVD Risk score (Arnett DK, et al., 2019) failed to calculate for the following reasons:   The 2019 ASCVD risk score is only valid for ages 69 to 50    Assessment & Plan:   #1 skin rash.  Likely related to some sort of bite possibly chiggers.  Pruritus gradually improving.  Recent prednisone as above.  Avoid further systemic prednisone at this time.  Continue antihistamine.  #2 atypical chest pain at rest.  EKG was obtained during time of pain and shows sinus rhythm with no acute ST-T changes.  We compared with prior tracing 2019 that did not see any significant changes.  He does not have any worrisome symptoms such as recent exertional chest pains.  Observe for now.   Return in about 4 weeks (around 06/07/2022).    Carolann Littler, MD

## 2022-05-10 NOTE — Patient Instructions (Signed)
Set up follow up in one month

## 2022-05-26 ENCOUNTER — Ambulatory Visit
Admission: EM | Admit: 2022-05-26 | Discharge: 2022-05-26 | Disposition: A | Discharge status: Home / Self Care | Payer: 59 | Attending: Family Medicine | Admitting: Family Medicine

## 2022-05-26 ENCOUNTER — Ambulatory Visit (INDEPENDENT_AMBULATORY_CARE_PROVIDER_SITE_OTHER): Payer: 59

## 2022-05-26 DIAGNOSIS — S80862A Insect bite (nonvenomous), left lower leg, initial encounter: Secondary | ICD-10-CM

## 2022-05-26 DIAGNOSIS — R079 Chest pain, unspecified: Secondary | ICD-10-CM

## 2022-05-26 DIAGNOSIS — W57XXXA Bitten or stung by nonvenomous insect and other nonvenomous arthropods, initial encounter: Secondary | ICD-10-CM

## 2022-05-26 DIAGNOSIS — R0789 Other chest pain: Secondary | ICD-10-CM | POA: Diagnosis not present

## 2022-05-26 LAB — POCT URINALYSIS DIP (MANUAL ENTRY)
Bilirubin, UA: NEGATIVE
Blood, UA: NEGATIVE
Glucose, UA: NEGATIVE mg/dL
Leukocytes, UA: NEGATIVE
Nitrite, UA: NEGATIVE
Protein Ur, POC: NEGATIVE mg/dL
Spec Grav, UA: 1.025 (ref 1.010–1.025)
Urobilinogen, UA: 0.2 E.U./dL
pH, UA: 5.5 (ref 5.0–8.0)

## 2022-05-26 NOTE — ED Triage Notes (Signed)
Pt presents to Urgent Care with c/o intermittent RUQ pain x 2 days and dysuria w/ frequency since yesterday. Hx of kidney stone.

## 2022-05-26 NOTE — Discharge Instructions (Signed)
Try taking Ibuprofen '200mg'$ , 4 tabs every 8 hours with food as needed for chest pain.

## 2022-05-26 NOTE — ED Provider Notes (Signed)
[name]    CSN: [id number] Arrival date & time: 05/26/22  1103      History   Chief Complaint Chief Complaint  Patient presents with   Abdominal Pain   Dysuria   Urinary Frequency    HPI Bradley [name] is a 26 y.o. male.   Patient presents with three complaints: 1)  Two days ago he developed intermittent right upper quadrant pain.  He denies cough, pleuritic pain, or shortness of breath.  He recalls no injury to his chest and has not had a rash.  Yesterday he developed some transient urgency/dysuria about 3pm, now completely resolved.  He passed a kidney stone about 3 to 4 years ago and is concerned he may have another.  However, his present pain is completely different. 2)  One month ago he had a tick bite on his left lower leg.  The tick was tiny and did not become engorged.  He had no subsequent "bulls eye" rash or rash elsewhere.  Yesterday he noticed a small amount of redness at the tick bite site without swelling, itching, or tenderness. 3)  He requests that his ears be examined.  He has a past history of otitis media but denies ear pain at present.  The history is provided by the patient.    Past Medical History:  Diagnosis Date   Allergy to alpha-gal    Anxiety    Chronic headaches    Clavicular fracture    Diverticulosis    History of chickenpox    Mild intermittent asthma 06/07/2018   Shingles    26 years old.   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   Moderate persistent asthma without complication 40/06/2724   Lesion of palate 09/06/2021   Allergy to alpha-gal 06/16/2021   Anal fissure 03/16/2021   Right ankle pain 07/06/2020   Dysphagia    Gastroesophageal reflux disease 05/19/2020   Urticaria 05/18/2020   Seasonal and perennial allergic rhinoconjunctivitis  04/19/2020   Mild persistent asthma, uncomplicated 36/64/4034   Low back pain 12/18/2019   Shortness of breath 10/15/2019   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   Hamstring injury, right, initial encounter 01/23/2019   Migraine without aura and without status migrainosus, not intractable 07/10/2018   Morbid obesity with BMI of 40.0-44.9, adult ([place]) 07/10/2018   Mild intermittent asthma without complication 74/25/9563   Atypical chest pain 12/21/2017   Generalized abdominal pain 12/17/2017   Anxiety 11/28/2017    Past Surgical History:  Procedure Laterality Date   COLONOSCOPY     CYSTECTOMY  01/2019   scrotum area    ESOPHAGEAL MANOMETRY N/A 06/09/2020   Procedure: ESOPHAGEAL MANOMETRY (EM);  Surgeon: Lavena Bullion, DO;  Location: [facility];  Service: Gastroenterology;  Laterality: N/A;   ESOPHAGOGASTRODUODENOSCOPY     shoulder surgery Right 08/26/2019   WISDOM TOOTH EXTRACTION         Home Medications    Prior to Admission medications   Medication Sig Start Date End Date Taking? Authorizing Provider  albuterol (VENTOLIN HFA) 108 (90 Base) MCG/ACT inhaler Inhale 2 puffs into the lungs every 6 (six) hours as needed for wheezing or shortness of breath. 03/07/22   Dara Hoyer, FNP  ALVESCO 160 MCG/ACT inhaler Inhale 2 puffs into the lungs 2 (  two) times daily. 03/07/22   Ambs, Kathrine Cords, FNP  AMBULATORY NON FORMULARY MEDICATION Diltiazem gel 2% with lidocaine  Apply a pea sized amount into your rectum three times daily as needed Dispense 30 GM zero refill 01/04/22   Vladimir Crofts, PA-C  AUVI-Q 0.3 MG/0.3ML SOAJ injection Inject 0.3 mg into the muscle as needed for anaphylaxis. 03/07/22   Ambs, Kathrine Cords, FNP  citalopram (CELEXA) 20 MG tablet TAKE 1 TABLET(20 MG) BY MOUTH DAILY 12/12/21   Burchette, Alinda Sierras, MD  levocetirizine (XYZAL) 5 MG tablet Take 1 tablet (5 mg total) by mouth every evening. 03/07/22   Ambs, Kathrine Cords, FNP  NURTEC 75 MG TBDP Take 1 tablet  by mouth daily as needed. 09/01/21   [provider]  ondansetron (ZOFRAN-ODT) 8 MG disintegrating tablet Take 1 tablet (8 mg total) by mouth every 8 (eight) hours as needed for nausea or vomiting. 12/30/21   Eliezer Lofts, FNP  RESTASIS 0.05 % ophthalmic emulsion 1 drop 2 (two) times daily. 08/01/21   [provider]  sildenafil (VIAGRA) 100 MG tablet TAKE 1/2 TO 1 TABLET(50 TO 100 MG) BY MOUTH DAILY AS NEEDED FOR ERECTILE DYSFUNCTION 01/02/22   Burchette, Alinda Sierras, MD    Family History Family History  Problem Relation Age of Onset   Hypertension Mother    Colon polyps Mother        might be mistakrn   Diabetes Father        type II   Migraines Sister    Colon cancer Neg Hx    Esophageal cancer Neg Hx    Rectal cancer Neg Hx    Stomach cancer Neg Hx     Social History Social History   Tobacco Use   Smoking status: Never   Smokeless tobacco: Never  Vaping Use   Vaping Use: Never used  Substance Use Topics   Alcohol use: Yes    Alcohol/week: 3.0 standard drinks of alcohol    Types: 3 Standard drinks or equivalent per week    Comment: occ   Drug use: Yes    Frequency: 2.0 times per week    Types: Marijuana     Allergies   Shellfish allergy, Hydrocodone, and Other   Review of Systems Review of Systems  Constitutional:  Negative for activity change, appetite change, chills, diaphoresis, fatigue and fever.  HENT:  Negative for congestion, ear discharge, ear pain and hearing loss.   Eyes: Negative.   Respiratory:  Negative for cough, chest tightness, shortness of breath, wheezing and stridor.   Cardiovascular: Negative.   Gastrointestinal: Negative.   Genitourinary:  Positive for dysuria and urgency. Negative for flank pain, frequency, hematuria, penile discharge, penile pain and testicular pain.  Musculoskeletal: Negative.   Skin:  Positive for color change. Negative for rash.  Hematological:  Negative for adenopathy.     Physical Exam Triage Vital  Signs ED Triage Vitals  Enc Vitals Group     BP 05/26/22 1158 (!) 152/107     Pulse Rate 05/26/22 1158 79     Resp 05/26/22 1158 20     Temp 05/26/22 1158 98.8 F (37.1 C)     Temp Source 05/26/22 1158 Oral     SpO2 05/26/22 1158 98 %     Weight 05/26/22 1159 (!) 365 lb (165.6 kg)     Height 05/26/22 1159 '6\' 6"'$  (1.981 m)     Head Circumference --      Peak Flow --  Pain Score 05/26/22 1157 0     Pain Loc --      Pain Edu? --      Excl. in [place]? --    No data found.  Updated Vital Signs BP 122/82 (BP Location: Right Arm)   Pulse 79   Temp 98.8 F (37.1 C) (Oral)   Resp 20   Ht '6\' 6"'$  (1.981 m)   Wt (!) 165.6 kg   SpO2 98%   BMI 42.18 kg/m   Visual Acuity Right Eye Distance:   Left Eye Distance:   Bilateral Distance:    Right Eye Near:   Left Eye Near:    Bilateral Near:     Physical Exam Vitals and nursing note reviewed.  Constitutional:      General: He is not in acute distress. HENT:     Head: Normocephalic.     Right Ear: Tympanic membrane, ear canal and external ear normal.     Left Ear: Tympanic membrane, ear canal and external ear normal.     Nose: Nose normal.     Mouth/Throat:     Mouth: Mucous membranes are moist.     Pharynx: Oropharynx is clear.  Eyes:     Extraocular Movements: Extraocular movements intact.     Conjunctiva/sclera: Conjunctivae normal.     Pupils: Pupils are equal, round, and reactive to light.  Cardiovascular:     Rate and Rhythm: Normal rate and regular rhythm.     Heart sounds: Normal heart sounds.  Pulmonary:     Breath sounds: Normal breath sounds.  Chest:     Chest wall: Tenderness present. No swelling or crepitus.       Comments: There is tenderness to palpation over the right anterior chest as noted on diagram.  There is no rash present.   Abdominal:     Palpations: Abdomen is soft.     Tenderness: There is no abdominal tenderness. There is no right CVA tenderness or guarding.  Musculoskeletal:         General: No tenderness.     Cervical back: Neck supple.     Right lower leg: No edema.     Left lower leg: No edema.  Lymphadenopathy:     Cervical: No cervical adenopathy.  Skin:    General: Skin is warm and dry.     Findings: No rash.     Comments: Left ankle has a small slightly erythematous macule about 80m diameter suggestive of a prior insect bite.  No evidence cellulitis.  Neurological:     Mental Status: He is alert.      UC Treatments / Results  Labs (all labs ordered are listed, but only abnormal results are displayed) Labs Reviewed  POCT URINALYSIS DIP (MANUAL ENTRY) - Abnormal; Notable for the following components:      Result Value   Ketones, POC UA trace (5) (*)    All other components within normal limits  ROCKY MTN SPOTTED FVR ABS PNL(IGG+IGM)  LYME DISEASE SEROLOGY W/REFLEX    EKG   Radiology DG Chest 2 View  Result Date: 05/26/2022 CLINICAL DATA:  Right chest pain EXAM: CHEST - 2 VIEW COMPARISON:  12/08/2017 FINDINGS: The heart size and mediastinal contours are within normal limits. Both lungs are clear. The visualized skeletal structures are unremarkable. IMPRESSION: No active cardiopulmonary disease. Electronically Signed   By: PElmer PickerM.D.   On: 05/26/2022 13:47    Procedures Procedures (including critical care time)  Medications Ordered in UC Medications -  No data to display  Initial Impression / Assessment and Plan / UC Course  I have reviewed the triage vital signs and the nursing notes.  Pertinent labs & imaging results that were available during my care of the patient were reviewed by me and considered in my medical decision making (see chart for details).    Normal chest x-ray reassuring.  Right chest pain probably represents costochondritis.  Urinalysis shows no evidence nephrolithiasis.  Treat symptomatically.  Will check for tick-borne disease. Followup with Family Doctor if symptoms persist.  Final Clinical Impressions(s)  / UC Diagnoses   Final diagnoses:  Tick bite of left lower leg, initial encounter  Chest pain, musculoskeletal     Discharge Instructions      Try taking Ibuprofen '200mg'$ , 4 tabs every 8 hours with food as needed for chest pain.     ED Prescriptions   None       Kandra Nicolas, MD 05/28/22 1122

## 2022-05-27 ENCOUNTER — Telehealth: Payer: Self-pay | Admitting: Emergency Medicine

## 2022-05-27 NOTE — Telephone Encounter (Signed)
Spoke with patient states that he is doing better.  Patient did ask regarding his labs that were drawn, advised they are not back yet.  Once they are back, patient will be able to view in his mychart.  Patient voices understanding.

## 2022-05-30 LAB — ROCKY MTN SPOTTED FVR ABS PNL(IGG+IGM)
RMSF IgG: NOT DETECTED
RMSF IgM: NOT DETECTED

## 2022-05-30 LAB — B. BURGDORFI ANTIBODIES: B burgdorferi Ab IgG+IgM: <0.9 index

## 2022-06-01 ENCOUNTER — Ambulatory Visit (INDEPENDENT_AMBULATORY_CARE_PROVIDER_SITE_OTHER): Payer: 59 | Admitting: Adult Health

## 2022-06-01 ENCOUNTER — Encounter: Payer: Self-pay | Admitting: Adult Health

## 2022-06-01 ENCOUNTER — Ambulatory Visit: Payer: 59 | Admitting: Family Medicine

## 2022-06-01 VITALS — BP 132/78 | HR 89 | Temp 98.5°F | Ht 78.0 in | Wt 377.0 lb

## 2022-06-01 DIAGNOSIS — R300 Dysuria: Secondary | ICD-10-CM | POA: Diagnosis not present

## 2022-06-01 LAB — POCT URINALYSIS DIPSTICK
Bilirubin, UA: NEGATIVE
Blood, UA: NEGATIVE
Glucose, UA: NEGATIVE
Ketones, UA: NEGATIVE
Leukocytes, UA: NEGATIVE
Nitrite, UA: NEGATIVE
Protein, UA: POSITIVE — AB
Spec Grav, UA: 1.025 (ref 1.010–1.025)
Urobilinogen, UA: 0.2 E.U./dL
pH, UA: 6 (ref 5.0–8.0)

## 2022-06-01 NOTE — Progress Notes (Signed)
Subjective:    Patient ID: Bradley [name], male    DOB: [birth year], 26 y.o.   MRN: [id number]  HPI  26 year old male who  has a past medical history of Allergy to alpha-gal, Anxiety, Chronic headaches, Clavicular fracture, Diverticulosis, History of chickenpox, Mild intermittent asthma (06/07/2018), Shingles, Tubular adenoma of colon, and Urticaria (05/18/2020).  He is a patient of Dr. Elease Hashimoto who I am seeing today for an acute issue of dysuria. He reports that he was seen at an urgent care 6 days ago with the same symptoms   He reports that the dysuria is not as bad as it was last week but he continues to have dysuria towards the end of his stream.   No longer having the frequency he was having last week.   Denies fevers, chills ,drainage from his penis, no new sexual contacts and is not concerned for UTI   He has been experiencing some discomfort with bowel movements and had chills the first night he felt dysuria.   He does drink caffeine and does not drink a lot of water.    Review of Systems See HPI   Past Medical History:  Diagnosis Date   Allergy to alpha-gal    Anxiety    Chronic headaches    Clavicular fracture    Diverticulosis    History of chickenpox    Mild intermittent asthma 06/07/2018   Shingles    25 years old.   Tubular adenoma of colon    Urticaria 05/18/2020    Social History   Socioeconomic History   Marital status: Single    Spouse name: Not on file   Number of children: 0   Years of education: College   Highest education level: Not on file  Occupational History   Occupation: Sales executive  Tobacco Use   Smoking status: Never   Smokeless tobacco: Never  Vaping Use   Vaping Use: Never used  Substance and Sexual Activity   Alcohol use: Yes    Alcohol/week: 3.0 standard drinks of alcohol    Types: 3 Standard drinks or equivalent per week    Comment: occ   Drug use: Yes    Frequency: 2.0 times per week    Types: Marijuana   Sexual  activity: Not on file  Other Topics Concern   Not on file  Social History Narrative   Lives alone   Caffeine use: 1 cup coffee per day   Right handed    Social Determinants of Health   Financial Resource Strain: Not on file  Food Insecurity: Not on file  Transportation Needs: Not on file  Physical Activity: Not on file  Stress: Not on file  Social Connections: Not on file  Intimate Partner Violence: Not on file    Past Surgical History:  Procedure Laterality Date   COLONOSCOPY     CYSTECTOMY  01/2019   scrotum area    ESOPHAGEAL MANOMETRY N/A 06/09/2020   Procedure: ESOPHAGEAL MANOMETRY (EM);  Surgeon: Lavena Bullion, DO;  Location: [facility];  Service: Gastroenterology;  Laterality: N/A;   ESOPHAGOGASTRODUODENOSCOPY     shoulder surgery Right 08/26/2019   WISDOM TOOTH EXTRACTION      Family History  Problem Relation Age of Onset   Hypertension Mother    Colon polyps Mother        might be mistakrn   Diabetes Father        type II   Migraines Sister    Colon cancer  Neg Hx    Esophageal cancer Neg Hx    Rectal cancer Neg Hx    Stomach cancer Neg Hx     Allergies  Allergen Reactions   Shellfish Allergy Anaphylaxis   Hydrocodone Itching    Pt states he feels itchy after taking, but it doesn't preclude him taking hydrocodone if needed About 2-3 years ago.  It was the first time he took hydrocodone.   Other Diarrhea    Slightly allergic to red meat since tick bite.Marland KitchenMarland KitchenNot anaphylaxis, but GI sensitivity.    Current Outpatient Medications on File Prior to Visit  Medication Sig Dispense Refill   albuterol (VENTOLIN HFA) 108 (90 Base) MCG/ACT inhaler Inhale 2 puffs into the lungs every 6 (six) hours as needed for wheezing or shortness of breath. 18 g 1   ALVESCO 160 MCG/ACT inhaler Inhale 2 puffs into the lungs 2 (two) times daily. 1 each 5   AMBULATORY NON FORMULARY MEDICATION Diltiazem gel 2% with lidocaine  Apply a pea sized amount into your rectum three  times daily as needed Dispense 30 GM zero refill 30 g 0   AUVI-Q 0.3 MG/0.3ML SOAJ injection Inject 0.3 mg into the muscle as needed for anaphylaxis. 1 each 1   citalopram (CELEXA) 20 MG tablet TAKE 1 TABLET(20 MG) BY MOUTH DAILY 90 tablet 0   levocetirizine (XYZAL) 5 MG tablet Take 1 tablet (5 mg total) by mouth every evening. 30 tablet 5   NURTEC 75 MG TBDP Take 1 tablet by mouth daily as needed.     ondansetron (ZOFRAN-ODT) 8 MG disintegrating tablet Take 1 tablet (8 mg total) by mouth every 8 (eight) hours as needed for nausea or vomiting. 30 tablet 0   RESTASIS 0.05 % ophthalmic emulsion 1 drop 2 (two) times daily.     sildenafil (VIAGRA) 100 MG tablet TAKE 1/2 TO 1 TABLET(50 TO 100 MG) BY MOUTH DAILY AS NEEDED FOR ERECTILE DYSFUNCTION 10 tablet 2   No current facility-administered medications on file prior to visit.    BP 132/78   Pulse 89   Temp 98.5 F (36.9 C) (Oral)   Ht '6\' 6"'$  (1.981 m)   Wt (!) 377 lb (171 kg)   SpO2 99%   BMI 43.57 kg/m       Objective:   Physical Exam Vitals and nursing note reviewed.  Constitutional:      Appearance: Normal appearance. He is obese.  Genitourinary:    Prostate: Normal.     Rectum: Normal.  Skin:    General: Skin is warm and dry.     Capillary Refill: Capillary refill takes less than 2 seconds.  Neurological:     General: No focal deficit present.     Mental Status: He is alert and oriented to person, place, and time.  Psychiatric:        Mood and Affect: Mood normal.        Behavior: Behavior normal.        Thought Content: Thought content normal.       Assessment & Plan:  1. Dysuria  - POC Urinalysis Dipstick- negative for infection but concentrated urine.  - Not prostatitis and no signs of UTI.  - Encouraged to drink more water and cut back on caffeine  - Can consider Urology referral in the future - Can use [facility] to help with symptoms   Dorothyann Peng, NP

## 2022-06-07 ENCOUNTER — Ambulatory Visit: Payer: 59 | Admitting: Family Medicine

## 2022-06-14 ENCOUNTER — Encounter: Payer: 59 | Admitting: Family Medicine

## 2022-06-27 ENCOUNTER — Encounter: Payer: 59 | Admitting: Family Medicine

## 2022-07-07 ENCOUNTER — Encounter: Payer: 59 | Admitting: Family Medicine

## 2022-07-18 ENCOUNTER — Ambulatory Visit (INDEPENDENT_AMBULATORY_CARE_PROVIDER_SITE_OTHER): Payer: 59 | Admitting: Family Medicine

## 2022-07-18 ENCOUNTER — Encounter: Payer: Self-pay | Admitting: Family Medicine

## 2022-07-18 VITALS — BP 112/70 | HR 92 | Temp 97.9°F | Ht 78.0 in | Wt 381.3 lb

## 2022-07-18 DIAGNOSIS — S99922A Unspecified injury of left foot, initial encounter: Secondary | ICD-10-CM

## 2022-07-18 DIAGNOSIS — S99922D Unspecified injury of left foot, subsequent encounter: Secondary | ICD-10-CM | POA: Diagnosis not present

## 2022-07-18 NOTE — Patient Instructions (Signed)
Watch closely for any signs of infection such as progressive redness, warmth, or pain  New toenail grow can take up to 9 to 10 months, assuming no injury to the nail matrix.

## 2022-07-18 NOTE — Progress Notes (Signed)
Established Patient Office Visit  Subjective   Patient ID: Bradley [name], male    DOB: 1996-05-25  Age: 26 y.o. MRN: [id number]  Chief Complaint  Patient presents with   Toe Injury    X1 month     HPI   [name] is here following toenail trauma left fifth toe about 2 months ago.  He states he dropped a drill on his toe and noticed some discoloration.  Nail came off about 2 weeks ago.  Noticed a little mild erythema.  No drainage.  No pain.  No warmth.  He went to local urgent care yesterday for some type of work physical was told he had "right ear infection "and was started on amoxicillin which he thought was strange since he has not had any ear pain.  Past Medical History:  Diagnosis Date   Allergy to alpha-gal    Anxiety    Chronic headaches    Clavicular fracture    Diverticulosis    History of chickenpox    Mild intermittent asthma 06/07/2018   Shingles    26 years old.   Tubular adenoma of colon    Urticaria 05/18/2020   Past Surgical History:  Procedure Laterality Date   COLONOSCOPY     CYSTECTOMY  01/2019   scrotum area    ESOPHAGEAL MANOMETRY N/A 06/09/2020   Procedure: ESOPHAGEAL MANOMETRY (EM);  Surgeon: Lavena Bullion, DO;  Location: [facility];  Service: Gastroenterology;  Laterality: N/A;   ESOPHAGOGASTRODUODENOSCOPY     shoulder surgery Right 08/26/2019   WISDOM TOOTH EXTRACTION      reports that he has never smoked. He has never used smokeless tobacco. He reports current alcohol use of about 3.0 standard drinks of alcohol per week. He reports current drug use. Frequency: 2.00 times per week. Drug: Marijuana. family history includes Colon polyps in his mother; Diabetes in his father; Hypertension in his mother; Migraines in his sister. Allergies  Allergen Reactions   Shellfish Allergy Anaphylaxis   Hydrocodone Itching    Pt states he feels itchy after taking, but it doesn't preclude him taking hydrocodone if needed About 2-3 years ago.  It was the  first time he took hydrocodone.   Other Diarrhea    Slightly allergic to red meat since tick bite.Marland KitchenMarland KitchenNot anaphylaxis, but GI sensitivity.    Review of Systems  Constitutional:  Negative for chills and fever.  HENT:  Negative for ear pain.       Objective:     BP 112/70 (BP Location: Left Arm, Patient Position: Sitting, Cuff Size: Large)   Pulse 92   Temp 97.9 F (36.6 C) (Oral)   Ht '6\' 6"'$  (1.981 m)   Wt (!) 381 lb 4.8 oz (173 kg)   SpO2 97%   BMI 44.06 kg/m    Physical Exam Vitals reviewed.  Constitutional:      Appearance: Normal appearance.  HENT:     Ears:     Comments: Right and left eardrums are entirely normal in appearance. Musculoskeletal:     Comments: Left fifth toe reveals recent loss of toenail.  He has some early growth.  No significant erythema.  No warmth.  No tenderness.  Neurological:     Mental Status: He is alert.      No results found for any visits on 07/18/22.    The ASCVD Risk score (Arnett DK, et al., 2019) failed to calculate for the following reasons:   The 2019 ASCVD risk score is only valid  for ages 34 to 41    Assessment & Plan:   Problem List Items Addressed This Visit   None Visit Diagnoses     Injury of toenail of left foot, initial encounter    -  Primary     Patient had toenail trauma couple months ago and lost his nail.  No signs of secondary infection at this time.  He is aware will take about 9 to 10 months for full nail growth  No evidence for right otitis media  No follow-ups on file.    Carolann Littler, MD

## 2022-07-21 ENCOUNTER — Other Ambulatory Visit: Payer: Self-pay | Admitting: Family Medicine

## 2022-08-03 ENCOUNTER — Encounter: Payer: Self-pay | Admitting: Family Medicine

## 2022-08-03 ENCOUNTER — Ambulatory Visit (INDEPENDENT_AMBULATORY_CARE_PROVIDER_SITE_OTHER): Payer: 59 | Admitting: Family Medicine

## 2022-08-03 VITALS — BP 118/90 | HR 87 | Temp 98.0°F | Wt 375.2 lb

## 2022-08-03 DIAGNOSIS — H60501 Unspecified acute noninfective otitis externa, right ear: Secondary | ICD-10-CM

## 2022-08-03 DIAGNOSIS — H65192 Other acute nonsuppurative otitis media, left ear: Secondary | ICD-10-CM | POA: Diagnosis not present

## 2022-08-03 NOTE — Progress Notes (Signed)
Subjective:    Patient ID: ERCIL CASSIS, male    DOB: March 24, 1996, 26 y.o.   MRN: [id number]  Chief Complaint  Patient presents with   Ear Pain    Started couple days ago, both ears are hurting, but right more that left. Feels like short stabbing pains. Nothing taken or tried for pain, has had the pain before.     HPI Patient was seen today for acute concern.  Patient endorses right ear with itching and stabbing pain times a few days.  Patient states symptoms increased and he went to UC last night.  Told had right ear infection, given Augmentin.  Patient just picked up Rx this morning.  Wanted to be sure he actually had an ear infection.  Patient denies use of objects in ears including Q-tips or earbuds.  Patient does mention a mop flying into his R ear, thinks it came out.  Patient endorses completing course of doxycycline 5 days ago for abscess.  Past Medical History:  Diagnosis Date   Allergy to alpha-gal    Anxiety    Chronic headaches    Clavicular fracture    Diverticulosis    History of chickenpox    Mild intermittent asthma 06/07/2018   Shingles    26 years old.   Tubular adenoma of colon    Urticaria 05/18/2020    Allergies  Allergen Reactions   Shellfish Allergy Anaphylaxis   Hydrocodone Itching    Pt states he feels itchy after taking, but it doesn't preclude him taking hydrocodone if needed About 2-3 years ago.  It was the first time he took hydrocodone.   Other Diarrhea    Slightly allergic to red meat since tick bite.Marland KitchenMarland KitchenNot anaphylaxis, but GI sensitivity.    ROS General: Denies fever, chills, night sweats, changes in weight, changes in appetite HEENT: Denies headaches, ear pain, changes in vision, rhinorrhea, sore throat  + R ear pain/pressure CV: Denies CP, palpitations, SOB, orthopnea Pulm: Denies SOB, cough, wheezing GI: Denies abdominal pain, nausea, vomiting, diarrhea, constipation GU: Denies dysuria, hematuria, frequency Msk: Denies muscle cramps,  joint pains Neuro: Denies weakness, numbness, tingling Skin: Denies rashes, bruising Psych: Denies depression, anxiety, hallucinations     Objective:    Blood pressure (!) 118/90, pulse 87, temperature 98 F (36.7 C), temperature source Oral, weight (!) 375 lb 3.2 oz (170.2 kg), SpO2 97 %.  Gen. Pleasant, well-nourished, in no distress, normal affect   HEENT: [place]/AT, face symmetric, conjunctiva clear, no scleral icterus, PERRLA, EOMI, nares patent without drainage.  R external ear normal.  R canal with erythema and mild edema.  R TM full without erythema, effusion, or suppurative fluid.  Left external ear and canal normal.  Left TM full with effusion. Lungs: no accessory muscle use Cardiovascular: RRR, no peripheral edema Neuro:  A&Ox3, CN II-XII intact, normal gait Skin:  Warm, no lesions/ rash   Wt Readings from Last 3 Encounters:  08/03/22 (!) 375 lb 3.2 oz (170.2 kg)  07/18/22 (!) 381 lb 4.8 oz (173 kg)  06/01/22 (!) 377 lb (171 kg)    Lab Results  Component Value Date   WBC 7.1 01/03/2022   HGB 14.4 01/03/2022   HCT 42.3 01/03/2022   PLT 223.0 01/03/2022   GLUCOSE 99 01/03/2022   CHOL 132 01/21/2020   TRIG 82.0 01/04/2022   HDL 34.00 (L) 01/21/2020   LDLCALC 82 01/21/2020   ALT 17 01/03/2022   AST 13 01/03/2022   NA 139 01/03/2022   K  4.1 01/03/2022   CL 105 01/03/2022   CREATININE 1.08 01/03/2022   BUN 11 01/03/2022   CO2 26 01/03/2022   TSH 1.120 05/20/2020   HGBA1C 5.0 01/21/2020    Assessment/Plan:  Acute effusion of left ear  Acute otitis externa of right ear, unspecified type  Patient encouraged to start Augmentin as prescribed by UC.  Also advised to take OTC antihistamine such as Claritin, Zyrtec, Xyzal, Allegra for the next few days to help with history of eustachian tube dysfunction/allergies.  Consider taking a probiotic due to recent antibiotic use.  F/u as needed  Grier Mitts, MD

## 2022-08-03 NOTE — Patient Instructions (Signed)
You can take the antibiotic as prescribed from urgent care.  You can also take OTC antihistamine to help with your pressure.

## 2022-08-04 ENCOUNTER — Telehealth: Payer: Self-pay | Admitting: Family Medicine

## 2022-08-04 MED ORDER — ALBUTEROL SULFATE HFA 108 (90 BASE) MCG/ACT IN AERS: 2.0000 | INHALATION_SPRAY | Freq: Four times a day (QID) | RESPIRATORY_TRACT | 0 refills | Status: DC | PRN

## 2022-08-04 NOTE — Telephone Encounter (Signed)
Requesting a prescription for albuterol (VENTOLIN HFA) 108 (90 Base) MCG/ACT inhaler generic. Says he cannot get in touch with allergist for them to fix this.

## 2022-08-04 NOTE — Telephone Encounter (Signed)
Rx sent 

## 2022-08-09 ENCOUNTER — Encounter: Payer: Self-pay | Admitting: Family Medicine

## 2022-08-09 MED ORDER — CLONAZEPAM 0.5 MG PO TABS: 0.5000 mg | ORAL_TABLET | Freq: Two times a day (BID) | ORAL | 0 refills | Status: DC | PRN

## 2022-08-09 NOTE — Telephone Encounter (Signed)
That seems reasonable.   I have sent in limited Clonazepam 0.5 mg tablets.  Eulas Post MD [facility]

## 2022-08-11 ENCOUNTER — Other Ambulatory Visit: Payer: Self-pay | Admitting: Family Medicine

## 2022-08-14 ENCOUNTER — Ambulatory Visit (INDEPENDENT_AMBULATORY_CARE_PROVIDER_SITE_OTHER): Payer: 59 | Admitting: Physician Assistant

## 2022-08-14 ENCOUNTER — Encounter: Payer: Self-pay | Admitting: Physician Assistant

## 2022-08-14 VITALS — BP 120/84 | HR 70 | Temp 97.5°F | Ht 78.0 in | Wt 377.4 lb

## 2022-08-14 DIAGNOSIS — R42 Dizziness and giddiness: Secondary | ICD-10-CM

## 2022-08-14 NOTE — Progress Notes (Signed)
Bradley [name] is a 26 y.o. male here for a follow up of a pre-existing problem.  History of Present Illness:   Chief Complaint  Patient presents with   Otalgia    Pt c/o ear infection right ear 11/9 was treated with amoxicillin, took about 1/2 of the Rx. He saw ENT last week and was told to stop due to looked like ear canal was just irritated. Pt now c/o dizziness slight discomfort right ear.    HPI  Episodic lightheadedness 2 weeks ago he was dx with an ear infection and given augmentin He went to an ENT and was told that he could stop his augmentin -- he was about 1/2 way through this -- as he didn't need it and was given cortisporin drops. He started these drops last night  Complains of one episode of light headiness yesterday Does not feel like it was related to dehydration, hypoglycemia  Denies: chest pain, SOB, n/v/d.   Past Medical History:  Diagnosis Date   Allergy to alpha-gal    Anxiety    Chronic headaches    Clavicular fracture    Diverticulosis    History of chickenpox    Mild intermittent asthma 06/07/2018   Shingles    26 years old.   Tubular adenoma of colon    Urticaria 05/18/2020     Social History   Tobacco Use   Smoking status: Never   Smokeless tobacco: Never  Vaping Use   Vaping Use: Never used  Substance Use Topics   Alcohol use: Yes    Alcohol/week: 3.0 standard drinks of alcohol    Types: 3 Standard drinks or equivalent per week    Comment: occ   Drug use: Yes    Frequency: 2.0 times per week    Types: Marijuana    Past Surgical History:  Procedure Laterality Date   COLONOSCOPY     CYSTECTOMY  01/2019   scrotum area    ESOPHAGEAL MANOMETRY N/A 06/09/2020   Procedure: ESOPHAGEAL MANOMETRY (EM);  Surgeon: Lavena Bullion, DO;  Location: [facility];  Service: Gastroenterology;  Laterality: N/A;   ESOPHAGOGASTRODUODENOSCOPY     shoulder surgery Right 08/26/2019   WISDOM TOOTH EXTRACTION      Family History  Problem Relation  Age of Onset   Hypertension Mother    Colon polyps Mother        might be mistakrn   Diabetes Father        type II   Migraines Sister    Colon cancer Neg Hx    Esophageal cancer Neg Hx    Rectal cancer Neg Hx    Stomach cancer Neg Hx     Allergies  Allergen Reactions   Shellfish Allergy Anaphylaxis   Hydrocodone Itching    Pt states he feels itchy after taking, but it doesn't preclude him taking hydrocodone if needed About 2-3 years ago.  It was the first time he took hydrocodone.   Other Diarrhea    Slightly allergic to red meat since tick bite.Marland KitchenMarland KitchenNot anaphylaxis, but GI sensitivity.    Current Medications:   Current Outpatient Medications:    albuterol (VENTOLIN HFA) 108 (90 Base) MCG/ACT inhaler, Inhale 2 puffs into the lungs every 6 (six) hours as needed for wheezing or shortness of breath., Disp: 18 g, Rfl: 0   ALVESCO 160 MCG/ACT inhaler, Inhale 2 puffs into the lungs 2 (two) times daily., Disp: 1 each, Rfl: 5   AUVI-Q 0.3 MG/0.3ML SOAJ injection, Inject 0.3 mg  into the muscle as needed for anaphylaxis., Disp: 1 each, Rfl: 1   citalopram (CELEXA) 20 MG tablet, TAKE 1 TABLET(20 MG) BY MOUTH DAILY, Disp: 90 tablet, Rfl: 0   clonazePAM (KLONOPIN) 0.5 MG tablet, Take 1 tablet (0.5 mg total) by mouth 2 (two) times daily as needed for anxiety., Disp: 20 tablet, Rfl: 0   levocetirizine (XYZAL) 5 MG tablet, Take 1 tablet (5 mg total) by mouth every evening., Disp: 30 tablet, Rfl: 5   neomycin-polymyxin b-dexamethasone (MAXITROL) 3.5-10000-0.1 SUSP, 5 drops 2 (two) times daily. In both ears, Disp: , Rfl:    RESTASIS 0.05 % ophthalmic emulsion, 1 drop 2 (two) times daily., Disp: , Rfl:    sildenafil (VIAGRA) 100 MG tablet, TAKE 1/2 TO 1 TABLET(50 TO 100 MG) BY MOUTH DAILY AS NEEDED FOR ERECTILE DYSFUNCTION, Disp: 10 tablet, Rfl: 1   Review of Systems:   Review of Systems  Constitutional:  Negative for chills, fever, malaise/fatigue and weight loss.  HENT:  Negative for hearing  loss, sinus pain and sore throat.   Respiratory:  Negative for cough and hemoptysis.   Cardiovascular:  Negative for chest pain, palpitations, leg swelling and PND.  Gastrointestinal:  Negative for abdominal pain, constipation, diarrhea, heartburn, nausea and vomiting.  Genitourinary:  Negative for dysuria, frequency and urgency.  Musculoskeletal:  Negative for back pain, myalgias and neck pain.  Skin:  Negative for itching and rash.  Neurological:  Negative for dizziness, tingling, seizures and headaches.  Endo/Heme/Allergies:  Negative for polydipsia.  Psychiatric/Behavioral:  Negative for depression. The patient is not nervous/anxious.     Vitals:   Vitals:   08/14/22 1537  BP: 120/84  Pulse: 70  Temp: (!) 97.5 F (36.4 C)  TempSrc: Temporal  SpO2: 98%  Weight: (!) 377 lb 6.1 oz (171.2 kg)  Height: '6\' 6"'$  (1.981 m)     Body mass index is 43.61 kg/m.  Physical Exam:   Physical Exam Vitals and nursing note reviewed.  Constitutional:      General: He is not in acute distress.    Appearance: He is well-developed. He is not ill-appearing or toxic-appearing.  HENT:     Head: Normocephalic and atraumatic.     Right Ear: Ear canal and external ear normal. A middle ear effusion is present. Tympanic membrane is not erythematous, retracted or bulging.     Left Ear: Ear canal and external ear normal. A middle ear effusion is present. Tympanic membrane is not erythematous, retracted or bulging.     Nose: Nose normal.     Right Sinus: No maxillary sinus tenderness or frontal sinus tenderness.     Left Sinus: No maxillary sinus tenderness or frontal sinus tenderness.     Mouth/Throat:     Pharynx: Uvula midline. No posterior oropharyngeal erythema.  Eyes:     General: Lids are normal.     Conjunctiva/sclera: Conjunctivae normal.  Neck:     Trachea: Trachea normal.  Cardiovascular:     Rate and Rhythm: Normal rate and regular rhythm.     Pulses: Normal pulses.     Heart sounds:  Normal heart sounds, S1 normal and S2 normal.  Pulmonary:     Effort: Pulmonary effort is normal.     Breath sounds: Normal breath sounds. No decreased breath sounds, wheezing, rhonchi or rales.  Lymphadenopathy:     Cervical: No cervical adenopathy.  Skin:    General: Skin is warm and dry.  Neurological:     Mental Status: He is alert.  GCS: GCS eye subscore is 4. GCS verbal subscore is 5. GCS motor subscore is 6.  Psychiatric:        Speech: Speech normal.        Behavior: Behavior normal. Behavior is cooperative.     Assessment and Plan:   Episodic lightheadedness No red flags on exam No other sx and this sx has resolved Recommend push fluids and use cortisporin drops as indicated Out of an abundance of caution, will update CBC and CMP as it has been a while since blood work If any new or worsening sx, call PCP office  I,Moesha Myer,acting as a scribe for [employer], PA.,have documented all relevant documentation on the behalf of Inda Coke, PA,as directed by  Inda Coke, PA while in the presence of Inda Coke, [place].  I, Inda Coke, [place], have reviewed all documentation for this visit. The documentation on 08/14/22 for the exam, diagnosis, procedures, and orders are all accurate and complete.  Inda Coke PA-C

## 2022-08-14 NOTE — Patient Instructions (Signed)
It was great to see you!  Your ears do not look infected but you do have fluid in them. Use the drops as prescribed.  Drink at least 64 oz water daily  If any new symptoms develop or lightheadedness returns, call us  Take care,  Inda Coke PA-C

## 2022-08-15 ENCOUNTER — Encounter: Payer: Self-pay | Admitting: Family Medicine

## 2022-08-15 ENCOUNTER — Encounter: Payer: Self-pay | Admitting: Physician Assistant

## 2022-08-15 LAB — CBC WITH DIFFERENTIAL/PLATELET
Basophils Absolute: 0 10*3/uL (ref 0.0–0.1)
Basophils Relative: 0.7 % (ref 0.0–3.0)
Eosinophils Absolute: 0.4 10*3/uL (ref 0.0–0.7)
Eosinophils Relative: 6.5 % — ABNORMAL HIGH (ref 0.0–5.0)
HCT: 43.6 % (ref 39.0–52.0)
Hemoglobin: 14.6 g/dL (ref 13.0–17.0)
Lymphocytes Relative: 30.1 % (ref 12.0–46.0)
Lymphs Abs: 2 10*3/uL (ref 0.7–4.0)
MCHC: 33.6 g/dL (ref 30.0–36.0)
MCV: 89.1 fl (ref 78.0–100.0)
Monocytes Absolute: 0.3 10*3/uL (ref 0.1–1.0)
Monocytes Relative: 4.7 % (ref 3.0–12.0)
Neutro Abs: 3.8 10*3/uL (ref 1.4–7.7)
Neutrophils Relative %: 58 % (ref 43.0–77.0)
Platelets: 236 10*3/uL (ref 150.0–400.0)
RBC: 4.89 Mil/uL (ref 4.22–5.81)
RDW: 12.6 % (ref 11.5–15.5)
WBC: 6.5 10*3/uL (ref 4.0–10.5)

## 2022-08-15 LAB — COMPREHENSIVE METABOLIC PANEL
ALT: 27 U/L (ref 0–53)
AST: 24 U/L (ref 0–37)
Albumin: 4.4 g/dL (ref 3.5–5.2)
Alkaline Phosphatase: 57 U/L (ref 39–117)
BUN: 9 mg/dL (ref 6–23)
CO2: 23 mEq/L (ref 19–32)
Calcium: 8.7 mg/dL (ref 8.4–10.5)
Chloride: 107 mEq/L (ref 96–112)
Creatinine, Ser: 0.84 mg/dL (ref 0.40–1.50)
GFR: 119.83 mL/min (ref >60.00)
Glucose, Bld: 80 mg/dL (ref 70–99)
Potassium: 3.9 mEq/L (ref 3.5–5.1)
Sodium: 138 mEq/L (ref 135–145)
Total Bilirubin: 0.5 mg/dL (ref 0.2–1.2)
Total Protein: 6.9 g/dL (ref 6.0–8.3)

## 2022-08-16 ENCOUNTER — Telehealth: Payer: Self-pay | Admitting: *Deleted

## 2022-08-16 DIAGNOSIS — Z91018 Allergy to other foods: Secondary | ICD-10-CM

## 2022-08-16 NOTE — Telephone Encounter (Signed)
Can you please order alpha gal panel and have him follow up with our clinic in about 1 month? Thank you

## 2022-08-16 NOTE — Telephone Encounter (Signed)
Lab order for Alpha Gal per Gareth Morgan.

## 2022-08-24 ENCOUNTER — Encounter: Payer: Self-pay | Admitting: Family Medicine

## 2022-08-24 ENCOUNTER — Other Ambulatory Visit: Payer: Self-pay | Admitting: Family Medicine

## 2022-09-08 ENCOUNTER — Ambulatory Visit (INDEPENDENT_AMBULATORY_CARE_PROVIDER_SITE_OTHER): Payer: 59 | Admitting: Family Medicine

## 2022-09-08 ENCOUNTER — Encounter: Payer: Self-pay | Admitting: Family Medicine

## 2022-09-08 VITALS — BP 134/82 | HR 80 | Temp 98.3°F | Ht 78.0 in | Wt 363.7 lb

## 2022-09-08 DIAGNOSIS — F419 Anxiety disorder, unspecified: Secondary | ICD-10-CM

## 2022-09-08 MED ORDER — CLONAZEPAM 0.5 MG PO TABS: 0.5000 mg | ORAL_TABLET | Freq: Two times a day (BID) | ORAL | 0 refills | Status: DC | PRN

## 2022-09-08 NOTE — Progress Notes (Signed)
Established Patient Office Visit  Subjective   Patient ID: Bradley [name], male    DOB: 07/06/1996  Age: 26 y.o. MRN: [id number]  Chief Complaint  Patient presents with   Medication Consultation    HPI   Bradley [name] is here to discuss anxiety symptoms.  He recently quit taking Celexa on his own but had increased anxiety symptoms.  He started himself back a few days ago.  He was using Klonopin intermittently and infrequently for breakthrough symptoms.  Requesting refill.  He specifically had questions regarding Celexa and concomitant use of antidiarrhea agent such as Imodium.  There is caution because of increased risk of prolonged QT interval.  He does have intermittent loose stools.  Does not take Imodium regularly.  He states that a few years ago he was eating a sandwich and swallowed a toothpick that was piercing through the sandwich and he swallowed this down into his stomach and has had some anxiety regarding eating since then.  Denies any recent dysphagia.  He has made some very positive dietary changes and is losing some weight intentionally.  Past Medical History:  Diagnosis Date   Allergy to alpha-gal    Anxiety    Chronic headaches    Clavicular fracture    Diverticulosis    History of chickenpox    Mild intermittent asthma 06/07/2018   Shingles    26 years old.   Tubular adenoma of colon    Urticaria 05/18/2020   Past Surgical History:  Procedure Laterality Date   COLONOSCOPY     CYSTECTOMY  01/2019   scrotum area    ESOPHAGEAL MANOMETRY N/A 06/09/2020   Procedure: ESOPHAGEAL MANOMETRY (EM);  Surgeon: Lavena Bullion, DO;  Location: [facility];  Service: Gastroenterology;  Laterality: N/A;   ESOPHAGOGASTRODUODENOSCOPY     shoulder surgery Right 08/26/2019   WISDOM TOOTH EXTRACTION      reports that he has never smoked. He has never used smokeless tobacco. He reports current alcohol use of about 3.0 standard drinks of alcohol per week. He reports current drug use.  Frequency: 2.00 times per week. Drug: Marijuana. family history includes Colon polyps in his mother; Diabetes in his father; Hypertension in his mother; Migraines in his sister. Allergies  Allergen Reactions   Shellfish Allergy Anaphylaxis   Hydrocodone Itching    Pt states he feels itchy after taking, but it doesn't preclude him taking hydrocodone if needed About 2-3 years ago.  It was the first time he took hydrocodone.   Other Diarrhea    Slightly allergic to red meat since tick bite.Marland KitchenMarland KitchenNot anaphylaxis, but GI sensitivity.    Review of Systems  Constitutional:  Negative for weight loss.  Respiratory:  Negative for sputum production.   Cardiovascular:  Negative for chest pain.  Neurological:  Negative for dizziness.  Psychiatric/Behavioral:  Negative for depression. The patient is nervous/anxious.       Objective:     BP 134/82 (BP Location: Left Arm, Patient Position: Sitting, Cuff Size: Large)   Pulse 80   Temp 98.3 F (36.8 C) (Oral)   Ht '6\' 6"'$  (1.981 m)   Wt (!) 363 lb 11.2 oz (165 kg)   SpO2 97%   BMI 42.03 kg/m  BP Readings from Last 3 Encounters:  09/08/22 134/82  08/14/22 120/84  08/03/22 (!) 118/90   Wt Readings from Last 3 Encounters:  09/08/22 (!) 363 lb 11.2 oz (165 kg)  08/14/22 (!) 377 lb 6.1 oz (171.2 kg)  08/03/22 (!) 375 lb  3.2 oz (170.2 kg)      Physical Exam Vitals reviewed.  Cardiovascular:     Rate and Rhythm: Normal rate and regular rhythm.  Pulmonary:     Effort: Pulmonary effort is normal.     Breath sounds: Normal breath sounds.  Neurological:     Mental Status: He is alert.  Psychiatric:        Mood and Affect: Mood normal.        Thought Content: Thought content normal.      No results found for any visits on 09/08/22.    The ASCVD Risk score (Arnett DK, et al., 2019) failed to calculate for the following reasons:   The 2019 ASCVD risk score is only valid for ages 67 to 42    Assessment & Plan:   Anxiety symptoms.   Previously controlled with Celexa.  He had increased symptoms with stopping Celexa.  He is aware that may take a few weeks to see full effect since going back on this.  There is a caution regarding Imodium and also many other drugs with risk for QT prolongation.  He knows not to take Imodium regularly with the Celexa.  -We refilled Klonopin to use infrequently for severe breakthrough symptoms.   No follow-ups on file.    Carolann Littler, MD

## 2022-09-27 ENCOUNTER — Encounter: Payer: Self-pay | Admitting: Family Medicine

## 2022-10-02 ENCOUNTER — Other Ambulatory Visit: Payer: Self-pay | Admitting: Family Medicine

## 2022-10-03 ENCOUNTER — Other Ambulatory Visit: Payer: Self-pay | Admitting: Family Medicine

## 2022-10-03 MED ORDER — CLONAZEPAM 0.5 MG PO TABS: 0.5000 mg | ORAL_TABLET | Freq: Two times a day (BID) | ORAL | 0 refills | Status: DC | PRN

## 2022-10-09 ENCOUNTER — Other Ambulatory Visit: Payer: Self-pay | Admitting: Family Medicine

## 2022-10-10 ENCOUNTER — Ambulatory Visit: Payer: 59 | Admitting: Internal Medicine

## 2022-10-10 ENCOUNTER — Encounter: Payer: Self-pay | Admitting: Internal Medicine

## 2022-10-10 VITALS — BP 123/78 | HR 71 | Temp 98.7°F | Wt 359.3 lb

## 2022-10-10 DIAGNOSIS — G8929 Other chronic pain: Secondary | ICD-10-CM

## 2022-10-10 DIAGNOSIS — H9202 Otalgia, left ear: Secondary | ICD-10-CM

## 2022-10-10 DIAGNOSIS — S0300XD Dislocation of jaw, unspecified side, subsequent encounter: Secondary | ICD-10-CM | POA: Diagnosis not present

## 2022-10-10 MED ORDER — ALBUTEROL SULFATE HFA 108 (90 BASE) MCG/ACT IN AERS: 2.0000 | INHALATION_SPRAY | Freq: Four times a day (QID) | RESPIRATORY_TRACT | 0 refills | Status: DC | PRN

## 2022-10-10 NOTE — Progress Notes (Signed)
Established Patient Office Visit     CC/Reason for Visit: Discuss left ear pain  HPI: Bradley [name] is a 27 y.o. male who is coming in today for the above mentioned reasons.  He has been seeing ENT for chronic bilateral ear pain and has been diagnosed as possibly having TMJ.  Nonetheless over the past few days he has been having severe left ear pain that feels similar to prior ear infections so he wanted this to be evaluated.  No recent URI symptoms.   Past Medical/Surgical History: Past Medical History:  Diagnosis Date   Allergy to alpha-gal    Anxiety    Chronic headaches    Clavicular fracture    Diverticulosis    History of chickenpox    Mild intermittent asthma 06/07/2018   Shingles    27 years old.   Tubular adenoma of colon    Urticaria 05/18/2020    Past Surgical History:  Procedure Laterality Date   COLONOSCOPY     CYSTECTOMY  01/2019   scrotum area    ESOPHAGEAL MANOMETRY N/A 06/09/2020   Procedure: ESOPHAGEAL MANOMETRY (EM);  Surgeon: Lavena Bullion, DO;  Location: [facility];  Service: Gastroenterology;  Laterality: N/A;   ESOPHAGOGASTRODUODENOSCOPY     shoulder surgery Right 08/26/2019   WISDOM TOOTH EXTRACTION      Social History:  reports that he has never smoked. He has never used smokeless tobacco. He reports current alcohol use of about 3.0 standard drinks of alcohol per week. He reports current drug use. Frequency: 2.00 times per week. Drug: Marijuana.  Allergies: Allergies  Allergen Reactions   Shellfish Allergy Anaphylaxis   Hydrocodone Itching    Pt states he feels itchy after taking, but it doesn't preclude him taking hydrocodone if needed About 2-3 years ago.  It was the first time he took hydrocodone.   Other Diarrhea    Slightly allergic to red meat since tick bite.Marland KitchenMarland KitchenNot anaphylaxis, but GI sensitivity.    Family History:  Family History  Problem Relation Age of Onset   Hypertension Mother    Colon polyps Mother         might be mistakrn   Diabetes Father        type II   Migraines Sister    Colon cancer Neg Hx    Esophageal cancer Neg Hx    Rectal cancer Neg Hx    Stomach cancer Neg Hx      Current Outpatient Medications:    albuterol (VENTOLIN HFA) 108 (90 Base) MCG/ACT inhaler, Inhale 2 puffs into the lungs every 6 (six) hours as needed for wheezing or shortness of breath., Disp: 18 g, Rfl: 0   AUVI-Q 0.3 MG/0.3ML SOAJ injection, Inject 0.3 mg into the muscle as needed for anaphylaxis., Disp: 1 each, Rfl: 1   citalopram (CELEXA) 20 MG tablet, TAKE 1 TABLET(20 MG) BY MOUTH DAILY, Disp: 90 tablet, Rfl: 0   clonazePAM (KLONOPIN) 0.5 MG tablet, Take 1 tablet (0.5 mg total) by mouth 2 (two) times daily as needed for anxiety., Disp: 20 tablet, Rfl: 0   levocetirizine (XYZAL) 5 MG tablet, Take 1 tablet (5 mg total) by mouth every evening., Disp: 30 tablet, Rfl: 5   RESTASIS 0.05 % ophthalmic emulsion, 1 drop 2 (two) times daily., Disp: , Rfl:    sildenafil (VIAGRA) 100 MG tablet, TAKE 1/2 TO 1 TABLET(50 TO 100 MG) BY MOUTH DAILY AS NEEDED FOR ERECTILE DYSFUNCTION, Disp: 10 tablet, Rfl: 1   ALVESCO  160 MCG/ACT inhaler, Inhale 2 puffs into the lungs 2 (two) times daily. (Patient not taking: Reported on 10/10/2022), Disp: 1 each, Rfl: 5  Review of Systems:  Negative unless indicated in HPI.   Physical Exam: Vitals:   10/10/22 1435 10/10/22 1441  BP: (!) 140/94 123/78  Pulse: 71   Temp: 98.7 F (37.1 C)   TempSrc: Oral   SpO2: 97%   Weight: (!) 359 lb 4.8 oz (163 kg)     Body mass index is 41.52 kg/m.    Physical Exam Vitals reviewed.  Constitutional:      Appearance: Normal appearance.  HENT:     Right Ear: Ear canal and external ear normal. A middle ear effusion is present.     Left Ear: Tympanic membrane, ear canal and external ear normal.     Mouth/Throat:     Mouth: Mucous membranes are moist.     Pharynx: Oropharynx is clear.  Eyes:     Conjunctiva/sclera: Conjunctivae normal.      Pupils: Pupils are equal, round, and reactive to light.  Cardiovascular:     Rate and Rhythm: Normal rate and regular rhythm.  Pulmonary:     Effort: Pulmonary effort is normal.     Breath sounds: Normal breath sounds.  Neurological:     Mental Status: He is alert.       Impression and Plan:  Chronic left ear pain  Dislocation of temporomandibular joint, subsequent encounter  -He has a mild effusion in his right ear, likely eustachian tube dysfunction. 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  He will follow-up with them for further management, he also has a follow-up with dentist coming up soon.  Time spent:23 minutes reviewing chart, interviewing and examining patient and formulating plan of care.      Lelon Frohlich, MD [facility]

## 2022-10-19 ENCOUNTER — Other Ambulatory Visit: Payer: Self-pay

## 2022-10-19 ENCOUNTER — Encounter: Payer: Self-pay | Admitting: Allergy

## 2022-10-19 ENCOUNTER — Ambulatory Visit (INDEPENDENT_AMBULATORY_CARE_PROVIDER_SITE_OTHER): Payer: 59 | Admitting: Allergy

## 2022-10-19 VITALS — BP 132/84 | HR 73 | Temp 97.6°F | Resp 18 | Ht 78.0 in | Wt 357.8 lb

## 2022-10-19 DIAGNOSIS — Z91018 Allergy to other foods: Secondary | ICD-10-CM

## 2022-10-19 DIAGNOSIS — T7800XD Anaphylactic reaction due to unspecified food, subsequent encounter: Secondary | ICD-10-CM

## 2022-10-19 DIAGNOSIS — H1010 Acute atopic conjunctivitis, unspecified eye: Secondary | ICD-10-CM

## 2022-10-19 DIAGNOSIS — J4540 Moderate persistent asthma, uncomplicated: Secondary | ICD-10-CM

## 2022-10-19 DIAGNOSIS — H1013 Acute atopic conjunctivitis, bilateral: Secondary | ICD-10-CM

## 2022-10-19 DIAGNOSIS — J302 Other seasonal allergic rhinitis: Secondary | ICD-10-CM | POA: Diagnosis not present

## 2022-10-19 DIAGNOSIS — K219 Gastro-esophageal reflux disease without esophagitis: Secondary | ICD-10-CM | POA: Diagnosis not present

## 2022-10-19 MED ORDER — QVAR REDIHALER 80 MCG/ACT IN AERB: 1.0000 | INHALATION_SPRAY | Freq: Every day | RESPIRATORY_TRACT | 5 refills | Status: DC

## 2022-10-19 NOTE — Assessment & Plan Note (Signed)
Alvesco too expensive and now only has albuterol. Asthma symptoms better controlled with maintenance inhaler. Today's spirometry was normal. Daily controller medication(s): restart Qvar 56mg 1-2 puffs once a day and rinse mouth after each use.  If not covered [place]. Check pricing for: Arnuity, Flovent, Pulmicort. During respiratory infections/flares:  Start Qvar 876m 2 puffs twice a day for 1-2 weeks until your breathing symptoms return to baseline.  Pretreat with albuterol 2 puffs  May use albuterol rescue inhaler 2 puffs every 4 to 6 hours as needed for shortness of breath, chest tightness, coughing, and wheezing. May use albuterol rescue inhaler 2 puffs 5 to 15 minutes prior to strenuous physical activities. Monitor frequency of use.  Get spirometry at next visit.

## 2022-10-19 NOTE — Progress Notes (Signed)
Follow Up Note  RE: [name] MRN: [id number] DOB: 01/07/1996 Date of Office Visit: 10/19/2022  Referring provider: Eulas Post, MD Primary care provider: Eulas Post, MD  Chief Complaint: follow up  History of Present Illness: I had the pleasure of seeing Bradley [name] for a follow up visit at the [facility] on 10/19/2022. He is a 27 y.o. male, who is being followed for asthma, allergic rhinitis, food allergy, eustachian tube dysfunction and GERD. His previous allergy office visit was on 03/23/2022 with Dr. Ernst Bowler. Today is a regular follow up visit.  Asthma Patient ran out of Alvesco and insurance states it will cost $160. Noticed wheezing 1-2 times per week at night and using albuterol prn with good benefit. This is happening more often since off daily maintenance inhaler.   Allergic rhinitis Taking Xyzal every other day with good benefit.  No nasal sprays use.   Having some b/l ear pain for 1 year. A few days ago felt like he had an ant crawl up in his right ear and flushed it out with water.    Alpha gal allergy Avoiding red meat with no issues.   Food allergy Eats oysters, lobster with no issues. Avoiding shrimp, clams and mussels.   Reflux Takes famotidine and Tums prn.   Assessment and Plan: Bradley [name] is a 27 y.o. male with: Moderate persistent asthma without complication Alvesco too expensive and now only has albuterol. Asthma symptoms better controlled with maintenance inhaler. Today's spirometry was normal. Daily controller medication(s): restart Qvar 90mg 1-2 puffs once a day and rinse mouth after each use.  If not covered let [place]. Check pricing for: Arnuity, Flovent, Pulmicort. During respiratory infections/flares:  Start Qvar 8110m 2 puffs twice a day for 1-2 weeks until your breathing symptoms return to baseline.  Pretreat with albuterol 2 puffs  May use albuterol rescue inhaler 2 puffs every 4 to 6 hours as needed  for shortness of breath, chest tightness, coughing, and wheezing. May use albuterol rescue inhaler 2 puffs 5 to 15 minutes prior to strenuous physical activities. Monitor frequency of use.  Get spirometry at next visit.  Seasonal and perennial allergic rhinoconjunctivitis Past history - 2019 blood work was positive to dust mites, cat, dog, grass, tree, borderline to ragweed, cockroach. Started AIT on 02/14/2018 (Mite-Cockroach-Ragweed & Pollen-Pet).  Interim history - stopped AIT in 2021. Some ear pain x 1 yr. No daily nasal spray use.  Use over the counter antihistamines such as Zyrtec (cetirizine), Claritin (loratadine), Allegra (fexofenadine), or Xyzal (levocetirizine) daily as needed. May take twice a day during allergy flares. May switch antihistamines every few months. Use Flonase (fluticasone) nasal spray 1 spray per nostril twice a day as needed for nasal congestion.  This may help with the ears. If no improvement with the ear pain - see dentis for possible TMJ.  Allergy to alpha-gal Recheck alpha-gal level.  Continue to avoid red meat.  Avoid future tick bites.  Anaphylactic reaction due to food, subsequent encounter Past history - 2020 bloodwork positive to shellfish. Interim history - no issues with oysters, lobsters. Avoid shellfish. For mild symptoms you can take over the counter antihistamines such as Benadryl and monitor symptoms closely. If symptoms worsen or if you have severe symptoms including breathing issues, throat closure, significant swelling, whole body hives, severe diarrhea and vomiting, lightheadedness then inject epinephrine and seek immediate medical care afterwards. Get bloodwork.  Gastroesophageal reflux disease Continue dietary and lifestyle modifications May  take famotidine and tums prn.  Return in about 6 months (around 04/19/2023).  Meds ordered this encounter  Medications   beclomethasone (QVAR REDIHALER) 80 MCG/ACT inhaler    Sig: Inhale 1-2 puffs  into the lungs daily. Rinse mouth after each use.    Dispense:  1 each    Refill:  5   Lab Orders         Alpha-Gal Panel         Allergen Profile, Shellfish      Diagnostics: Spirometry:  Tracings reviewed. His effort: Good reproducible efforts. FVC: 6.23L FEV1: 4.83L, 86% predicted FEV1/FVC ratio: 78% Interpretation: Spirometry consistent with normal pattern.  Please see scanned spirometry results for details.  Medication List:  Current Outpatient Medications  Medication Sig Dispense Refill   albuterol (VENTOLIN HFA) 108 (90 Base) MCG/ACT inhaler Inhale 2 puffs into the lungs every 6 (six) hours as needed for wheezing or shortness of breath. 18 g 0   AUVI-Q 0.3 MG/0.3ML SOAJ injection Inject 0.3 mg into the muscle as needed for anaphylaxis. 1 each 1   beclomethasone (QVAR REDIHALER) 80 MCG/ACT inhaler Inhale 1-2 puffs into the lungs daily. Rinse mouth after each use. 1 each 5   citalopram (CELEXA) 20 MG tablet TAKE 1 TABLET(20 MG) BY MOUTH DAILY 90 tablet 0   levocetirizine (XYZAL) 5 MG tablet Take 1 tablet (5 mg total) by mouth every evening. 30 tablet 5   RESTASIS 0.05 % ophthalmic emulsion 1 drop 2 (two) times daily.     No current facility-administered medications for this visit.   Allergies: Allergies  Allergen Reactions   Shellfish Allergy Anaphylaxis   Hydrocodone Itching    Pt states he feels itchy after taking, but it doesn't preclude him taking hydrocodone if needed About 2-3 years ago.  It was the first time he took hydrocodone.   Other Diarrhea    Slightly allergic to red meat since tick bite.Marland KitchenMarland KitchenNot anaphylaxis, but GI sensitivity.   I reviewed his past medical history, social history, family history, and environmental history and no significant changes have been reported from his previous visit.  Review of Systems  Constitutional:  Negative for appetite change, chills, fever and unexpected weight change.  HENT:  Negative for congestion and rhinorrhea.    Eyes:  Negative for itching.  Respiratory:  Negative for cough, chest tightness, shortness of breath and wheezing.   Gastrointestinal:  Negative for abdominal pain.  Skin:  Negative for rash.  Allergic/Immunologic: Positive for environmental allergies and food allergies.  Neurological:  Negative for headaches.    Objective: BP 132/84   Pulse 73   Temp 97.6 F (36.4 C)   Resp 18   Ht '6\' 6"'$  (1.981 m)   Wt (!) 357 lb 12.8 oz (162.3 kg)   SpO2 98%   BMI 41.35 kg/m  Body mass index is 41.35 kg/m. Physical Exam Vitals and nursing note reviewed.  Constitutional:      Appearance: Normal appearance. He is well-developed.  HENT:     Head: Normocephalic and atraumatic.     Right Ear: Tympanic membrane and external ear normal.     Left Ear: Tympanic membrane and external ear normal.     Nose: Nose normal.     Mouth/Throat:     Mouth: Mucous membranes are moist.     Pharynx: Oropharynx is clear.  Eyes:     Conjunctiva/sclera: Conjunctivae normal.  Cardiovascular:     Rate and Rhythm: Normal rate and regular rhythm.  Heart sounds: Normal heart sounds. No murmur heard. Pulmonary:     Effort: Pulmonary effort is normal.     Breath sounds: Normal breath sounds. No wheezing, rhonchi or rales.  Musculoskeletal:     Cervical back: Neck supple.  Skin:    General: Skin is warm.     Findings: No rash.  Neurological:     Mental Status: He is alert and oriented to person, place, and time.  Psychiatric:        Behavior: Behavior normal.    Previous notes and tests were reviewed. The plan was reviewed with the patient/family, and all questions/concerned were addressed.  It was my pleasure to see Aydrien today and participate in his care. Please feel free to contact me with any questions or concerns.  Sincerely,  Rexene Alberts, DO Allergy & Immunology  [facility] office: [place] office: [phone]

## 2022-10-19 NOTE — Assessment & Plan Note (Addendum)
Recheck alpha-gal level.  Continue to avoid red meat.  Avoid future tick bites.

## 2022-10-19 NOTE — Assessment & Plan Note (Signed)
Past history - 2019 blood work was positive to dust mites, cat, dog, grass, tree, borderline to ragweed, cockroach. Started AIT on 02/14/2018 (Mite-Cockroach-Ragweed & Pollen-Pet).  Interim history - stopped AIT in 2021. Some ear pain x 1 yr. No daily nasal spray use.  Use over the counter antihistamines such as Zyrtec (cetirizine), Claritin (loratadine), Allegra (fexofenadine), or Xyzal (levocetirizine) daily as needed. May take twice a day during allergy flares. May switch antihistamines every few months. Use Flonase (fluticasone) nasal spray 1 spray per nostril twice a day as needed for nasal congestion.  This may help with the ears. If no improvement with the ear pain - see dentis for possible TMJ.

## 2022-10-19 NOTE — Patient Instructions (Addendum)
Asthma Daily controller medication(s): restart Qvar 27mg 1-2 puffs once a day and rinse mouth after each use.  If not covered let [facility]. Check pricing for: Arnuity, Flovent, Pulmicort. During respiratory infections/flares:  Start Qvar 888m 2 puffs twice a day for 1-2 weeks until your breathing symptoms return to baseline.  Pretreat with albuterol 2 puffs  May use albuterol rescue inhaler 2 puffs every 4 to 6 hours as needed for shortness of breath, chest tightness, coughing, and wheezing. May use albuterol rescue inhaler 2 puffs 5 to 15 minutes prior to strenuous physical activities. Monitor frequency of use.  Breathing control goals:  Full participation in all desired activities (may need albuterol before activity) Albuterol use two times or less a week on average (not counting use with activity) Cough interfering with sleep two times or less a month Oral steroids no more than once a year No hospitalizations   Food allergy Avoid red meat and shellfish. For mild symptoms you can take over the counter antihistamines such as Benadryl and monitor symptoms closely. If symptoms worsen or if you have severe symptoms including breathing issues, throat closure, significant swelling, whole body hives, severe diarrhea and vomiting, lightheadedness then inject epinephrine and seek immediate medical care afterwards. Get bloodwork We are ordering labs, so please allow 1-2 weeks for the results to come back. With the newly implemented Cures Act, the labs might be visible to you at the same time that they become visible to me. However, I will not address the results until all of the results are back, so please be patient.  In the meantime, continue recommendations in your patient instructions, including avoidance measures (if applicable), until you hear from me.  Environmental allergies Use over the counter antihistamines such as Zyrtec (cetirizine), Claritin (loratadine), Allegra (fexofenadine), or  Xyzal (levocetirizine) daily as needed. May take twice a day during allergy flares. May switch antihistamines every few months. Use Flonase (fluticasone) nasal spray 1 spray per nostril twice a day as needed for nasal congestion.  This may help with the ears. If no improvement with the ear pain - see dentis for possible TMJ.  Heartburn: Continue lifestyle and dietary modifications. May take famotidine/tums as needed.  Follow up in 6 months or sooner if needed.

## 2022-10-19 NOTE — Assessment & Plan Note (Signed)
Continue dietary and lifestyle modifications May take famotidine and tums prn.

## 2022-10-19 NOTE — Assessment & Plan Note (Signed)
Past history - 2020 bloodwork positive to shellfish. Interim history - no issues with oysters, lobsters. Avoid shellfish. For mild symptoms you can take over the counter antihistamines such as Benadryl and monitor symptoms closely. If symptoms worsen or if you have severe symptoms including breathing issues, throat closure, significant swelling, whole body hives, severe diarrhea and vomiting, lightheadedness then inject epinephrine and seek immediate medical care afterwards. Get bloodwork.

## 2022-10-22 LAB — ALPHA-GAL PANEL
Allergen Lamb IgE: 0.53 kU/L — AB
Beef IgE: 0.53 kU/L — AB
IgE (Immunoglobulin E), Serum: 84 IU/mL (ref 6–495)
O215-IgE Alpha-Gal: 0.97 kU/L — AB
Pork IgE: 0.36 kU/L — AB

## 2022-10-22 LAB — ALLERGEN PROFILE, SHELLFISH
Clam IgE: 0.1 kU/L — AB
F023-IgE Crab: <0.1 kU/L
F080-IgE Lobster: <0.1 kU/L
F290-IgE Oyster: <0.1 kU/L
Scallop IgE: 0.12 kU/L — AB
Shrimp IgE: 0.19 kU/L — AB

## 2022-10-25 ENCOUNTER — Telehealth: Payer: Self-pay | Admitting: Family Medicine

## 2022-10-25 ENCOUNTER — Ambulatory Visit: Payer: 59 | Admitting: Family Medicine

## 2022-10-25 ENCOUNTER — Encounter: Payer: Self-pay | Admitting: Family Medicine

## 2022-10-25 VITALS — BP 120/80 | HR 76 | Temp 98.6°F | Ht 78.0 in | Wt 356.4 lb

## 2022-10-25 DIAGNOSIS — M26622 Arthralgia of left temporomandibular joint: Secondary | ICD-10-CM | POA: Diagnosis not present

## 2022-10-25 DIAGNOSIS — H9202 Otalgia, left ear: Secondary | ICD-10-CM

## 2022-10-25 NOTE — Progress Notes (Signed)
Established Patient Office Visit  Subjective   Patient ID: Bradley [name], male    DOB: 03-16-1996  Age: 27 y.o. MRN: [id number]  Chief Complaint  Patient presents with   Ear Pain    Patient complains of left ear pain    HPI   Bradley [name] is seen with left earache intermittently for several months.  He has been told in the past he may have eustachian tube dysfunction and also possibly TMJ.  Denies any recent hearing changes.  No drainage.  No fevers or chills.  No vertigo.  Does have some intermittent pain near the TMJ region.  He went previously at one point to a  specialist and they suggested $2200 mouthguard.  Past Medical History:  Diagnosis Date   Allergy to alpha-gal    Anxiety    Chronic headaches    Clavicular fracture    Diverticulosis    History of chickenpox    Mild intermittent asthma 06/07/2018   Shingles    27 years old.   Tubular adenoma of colon    Urticaria 05/18/2020   Past Surgical History:  Procedure Laterality Date   COLONOSCOPY     CYSTECTOMY  01/2019   scrotum area    ESOPHAGEAL MANOMETRY N/A 06/09/2020   Procedure: ESOPHAGEAL MANOMETRY (EM);  Surgeon: Bradley Bullion, DO;  Location: [facility];  Service: Gastroenterology;  Laterality: N/A;   ESOPHAGOGASTRODUODENOSCOPY     shoulder surgery Right 08/26/2019   WISDOM TOOTH EXTRACTION      reports that he has never smoked. He has never used smokeless tobacco. He reports current alcohol use of about 3.0 standard drinks of alcohol per week. He reports current drug use. Frequency: 2.00 times per week. Drug: Marijuana. family history includes Colon polyps in his mother; Diabetes in his father; Hypertension in his mother; Migraines in his sister. Allergies  Allergen Reactions   Shellfish Allergy Anaphylaxis   Hydrocodone Itching    Pt states he feels itchy after taking, but it doesn't preclude him taking hydrocodone if needed About 2-3 years ago.  It was the first time he took hydrocodone.   Other  Diarrhea    Slightly allergic to red meat since tick bite.Marland KitchenMarland KitchenNot anaphylaxis, but GI sensitivity.    Review of Systems  Constitutional:  Negative for chills and fever.  HENT:  Positive for ear pain. Negative for congestion, ear discharge, hearing loss and tinnitus.       Objective:     BP 120/80 (BP Location: Left Arm, Patient Position: Sitting, Cuff Size: Large)   Pulse 76   Temp 98.6 F (37 C) (Oral)   Ht '6\' 6"'$  (1.981 m)   Wt (!) 356 lb 6.4 oz (161.7 kg)   SpO2 98%   BMI 41.19 kg/m  BP Readings from Last 3 Encounters:  10/25/22 120/80  10/19/22 132/84  10/10/22 123/78   Wt Readings from Last 3 Encounters:  10/25/22 (!) 356 lb 6.4 oz (161.7 kg)  10/19/22 (!) 357 lb 12.8 oz (162.3 kg)  10/10/22 (!) 359 lb 4.8 oz (163 kg)      Physical Exam Vitals reviewed.  Constitutional:      General: He is not in acute distress.    Appearance: Normal appearance.  HENT:     Head:     Comments: Mild left TMJ tenderness to palpation.  No evidence for significant malalignment.    Right Ear: Tympanic membrane, ear canal and external ear normal.     Left Ear: Tympanic membrane, ear canal  and external ear normal.  Cardiovascular:     Rate and Rhythm: Normal rate and regular rhythm.  Neurological:     Mental Status: He is alert.      No results found for any visits on 10/25/22.    The ASCVD Risk score (Arnett DK, et al., 2019) failed to calculate for the following reasons:   The 2019 ASCVD risk score is only valid for ages 30 to 1    Assessment & Plan:   #1 left otalgia with normal exam.  Question radiation of pain from TMJ.  He does have some left TMJ tenderness.  -We suggested he try some icing of the TMJ joint and consider over-the-counter nonsteroidal such as ibuprofen or Aleve. -He does have some suspicion of bruxism at night and consider mouthguard at night -We did discuss possible trial of physical therapy but he would rather observe at this time.   Bradley Littler, MD

## 2022-10-26 ENCOUNTER — Encounter: Payer: Self-pay | Admitting: Family Medicine

## 2022-10-26 ENCOUNTER — Other Ambulatory Visit: Payer: Self-pay | Admitting: Family Medicine

## 2022-10-26 MED ORDER — CLONAZEPAM 0.5 MG PO TABS: 0.5000 mg | ORAL_TABLET | Freq: Two times a day (BID) | ORAL | 0 refills | Status: DC | PRN

## 2022-10-26 NOTE — Telephone Encounter (Signed)
Pt said he spoke with dr Elease Hashimoto yesterday about the medication that was not on his med list. Pt would like a refill on clonazepam

## 2022-10-26 NOTE — Addendum Note (Signed)
Addended by: Eulas Post on: 10/26/2022 05:01 PM   Modules accepted: Orders

## 2022-10-26 NOTE — Telephone Encounter (Signed)
Refilled, but- as discussed, would avoid regular use.    Eulas Post MD [facility]

## 2022-10-27 NOTE — Telephone Encounter (Signed)
Patient informed of message below via Mychart

## 2022-11-03 ENCOUNTER — Ambulatory Visit (INDEPENDENT_AMBULATORY_CARE_PROVIDER_SITE_OTHER): Payer: 59 | Admitting: Family Medicine

## 2022-11-03 ENCOUNTER — Encounter: Payer: Self-pay | Admitting: Family Medicine

## 2022-11-03 VITALS — BP 130/76 | HR 72 | Temp 98.0°F | Ht 78.0 in | Wt 350.1 lb

## 2022-11-03 DIAGNOSIS — L731 Pseudofolliculitis barbae: Secondary | ICD-10-CM

## 2022-11-03 DIAGNOSIS — L853 Xerosis cutis: Secondary | ICD-10-CM | POA: Diagnosis not present

## 2022-11-03 DIAGNOSIS — H6120 Impacted cerumen, unspecified ear: Secondary | ICD-10-CM

## 2022-11-03 NOTE — Progress Notes (Signed)
Established Patient Office Visit  Subjective   Patient ID: Bradley [name], male    DOB: 1995-11-25  Age: 27 y.o. MRN: [id number]  No chief complaint on file.   HPI   Bradley [name] is seen with question of ingrown hair left inner groin.  He was actually seen by dermatologist yesterday and reportedly had steroid injection into the area of swelling and erythema and was prescribed doxycycline but has not started yet.  He wanting a second opinion whether to start the doxycycline.  No fevers or chills.  No known history of MRSA.  He thinks he had an ingrown hair that triggered this.  Also complains of very dry skin generally specially arms.  Uses Dove soap.  Has had some ongoing concerns about cerumen in both ear canals.  Took a picture with phone left ear canal and noticed some brownish discoloration.  Denies any acute hearing loss.  Past Medical History:  Diagnosis Date   Allergy to alpha-gal    Anxiety    Chronic headaches    Clavicular fracture    Diverticulosis    History of chickenpox    Mild intermittent asthma 06/07/2018   Shingles    27 years old.   Tubular adenoma of colon    Urticaria 05/18/2020   Past Surgical History:  Procedure Laterality Date   COLONOSCOPY     CYSTECTOMY  01/2019   scrotum area    ESOPHAGEAL MANOMETRY N/A 06/09/2020   Procedure: ESOPHAGEAL MANOMETRY (EM);  Surgeon: Lavena Bullion, DO;  Location: [facility];  Service: Gastroenterology;  Laterality: N/A;   ESOPHAGOGASTRODUODENOSCOPY     shoulder surgery Right 08/26/2019   WISDOM TOOTH EXTRACTION      reports that he has never smoked. He has never used smokeless tobacco. He reports current alcohol use of about 3.0 standard drinks of alcohol per week. He reports current drug use. Frequency: 2.00 times per week. Drug: Marijuana. family history includes Colon polyps in his mother; Diabetes in his father; Hypertension in his mother; Migraines in his sister. Allergies  Allergen Reactions   Shellfish  Allergy Anaphylaxis   Hydrocodone Itching    Pt states he feels itchy after taking, but it doesn't preclude him taking hydrocodone if needed About 2-3 years ago.  It was the first time he took hydrocodone.   Other Diarrhea    Slightly allergic to red meat since tick bite.Marland KitchenMarland KitchenNot anaphylaxis, but GI sensitivity.    Review of Systems  Constitutional:  Negative for chills and fever.      Objective:     BP 130/76 (BP Location: Left Arm, Patient Position: Sitting, Cuff Size: Large)   Pulse 72   Temp 98 F (36.7 C) (Oral)   Ht 6' 6"$  (1.981 m)   Wt (!) 350 lb 1.6 oz (158.8 kg)   SpO2 98%   BMI 40.46 kg/m    Physical Exam Vitals reviewed.  Constitutional:      Appearance: Normal appearance.  HENT:     Ears:     Comments: Very minimal cerumen both canals externally with no obstruction. Cardiovascular:     Rate and Rhythm: Normal rate and regular rhythm.  Pulmonary:     Effort: Pulmonary effort is normal.     Breath sounds: Normal breath sounds.  Skin:    Comments: Left upper inner groin reveals very small area of erythema only about 5 mm diameter.  No pustular center.  No surrounding cellulitis changes.  No fluctuance.  Does have generally dry skin but  no specific rashes  Neurological:     Mental Status: He is alert.      No results found for any visits on 11/03/22.    The ASCVD Risk score (Arnett DK, et al., 2019) failed to calculate for the following reasons:   The 2019 ASCVD risk score is only valid for ages 46 to 62    Assessment & Plan:   #1 probable recent ingrown hair left upper groin.  He has some local irritation but no signs of cellulitis or abscess.  He has decided to hold doxycycline at this time which seems reasonable.  Follow-up for any progressive redness or other concerns  #2 minimal cerumen in canals.  Reassurance.  No indication for irrigation at this time  #3 dry skin.  Discussed importance of limiting bathing time and also not as much hot water  and liberal use of moisturizers after bathing.  He appears to already have fairly gentle soap  Carolann Littler, MD

## 2022-11-15 ENCOUNTER — Other Ambulatory Visit: Payer: Self-pay | Admitting: Family Medicine

## 2022-11-17 ENCOUNTER — Other Ambulatory Visit: Payer: Self-pay | Admitting: Family Medicine

## 2022-11-20 ENCOUNTER — Other Ambulatory Visit: Payer: Self-pay | Admitting: Family Medicine

## 2022-11-23 ENCOUNTER — Other Ambulatory Visit: Payer: Self-pay | Admitting: Family Medicine

## 2022-11-29 ENCOUNTER — Ambulatory Visit: Payer: 59 | Admitting: Family Medicine

## 2022-11-29 ENCOUNTER — Encounter: Payer: Self-pay | Admitting: Family Medicine

## 2022-11-29 VITALS — BP 118/84 | HR 70 | Temp 98.4°F | Ht 78.0 in | Wt 354.0 lb

## 2022-11-29 DIAGNOSIS — L720 Epidermal cyst: Secondary | ICD-10-CM

## 2022-11-29 DIAGNOSIS — R238 Other skin changes: Secondary | ICD-10-CM

## 2022-11-29 NOTE — Patient Instructions (Signed)
Thanks for coming in today.  Glad to hear that the cyst is improving.  It does not look overly infected at this time, and the injection as well as antibiotics from dermatology appear to be helping.  I recommend continuing those until completion, and follow-up with dermatology to decide if excision of cyst is needed.  The area on the lower back and buttock do not appear to be a cyst but possible small skin irritation.  Okay to apply topical antibiotic ointment to those areas but if that does not improve, please have those areas rechecked.  Please let me know if there are questions and take care.

## 2022-11-29 NOTE — Progress Notes (Signed)
Subjective:  Patient ID: Bradley [name], male    DOB: 1996-06-23  Age: 27 y.o. MRN: [id number]  CC:  Chief Complaint  Patient presents with   Cyst    Pt reports possible cyst of the Rt shoulder, notes knot present about the last year notes 2 weeks ago had it turn red and inflamed, Derm injected a week ago in hopes it would calm the irritation, has two similar spots he wanted checked for concern if possible due to this one becoming inflamed     HPI Bradley [name] presents for   Right shoulder cyst As above longstanding issue, recently inflamed, saw dermatology about a week ago, received injection of possible steroid - saw Dr. Anabel Bene at [facility]. Also placed on doxycycline.  Feeling about the same, no fever. No worsening. Questioning whether needs removed and 2 smaller areas as well - not flared, but on lower back and R posterior thigh.  Some drainage expressed few nights ago - clear - white fluid. Overall size of lesion has significantly improved since injection and antibiotics.   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   Moderate persistent asthma without complication [id number]   Lesion of palate 09/06/2021   Allergy to alpha-gal 06/16/2021   Anal fissure 03/16/2021   Right ankle pain 07/06/2020   Dysphagia    Gastroesophageal reflux disease 05/19/2020   Urticaria 05/18/2020   Seasonal and perennial allergic rhinoconjunctivitis 04/19/2020   Mild persistent asthma, uncomplicated [id number]   Low back pain 12/18/2019   Shortness of breath 10/15/2019   Anaphylactic reaction due to food, subsequent encounter 02/07/2019   Hamstring injury, right, initial encounter 01/23/2019   Migraine without aura and without status migrainosus, not intractable 07/10/2018   Morbid obesity with BMI of 40.0-44.9, adult ([place]) 07/10/2018   Mild  intermittent asthma without complication [id number]   Atypical chest pain 12/21/2017   Generalized abdominal pain 12/17/2017   Anxiety 11/28/2017   Past Medical History:  Diagnosis Date   Allergy to alpha-gal    Anxiety    Chronic headaches    Clavicular fracture    Diverticulosis    History of chickenpox    Mild intermittent asthma 06/07/2018   Shingles    27 years old.   Tubular adenoma of colon    Urticaria 05/18/2020   Past Surgical History:  Procedure Laterality Date   COLONOSCOPY     CYSTECTOMY  01/2019   scrotum area    ESOPHAGEAL MANOMETRY N/A 06/09/2020   Procedure: ESOPHAGEAL MANOMETRY (EM);  Surgeon: Lavena Bullion, DO;  Location: [facility];  Service: Gastroenterology;  Laterality: N/A;   ESOPHAGOGASTRODUODENOSCOPY     shoulder surgery Right 08/26/2019   WISDOM TOOTH EXTRACTION     Allergies  Allergen Reactions   Shellfish Allergy Anaphylaxis   Hydrocodone Itching    Pt states he feels itchy after taking, but it doesn't preclude him taking hydrocodone if needed About 2-3 years ago.  It was the first time he took hydrocodone.   Other Diarrhea    Slightly allergic to red meat since tick bite.Marland KitchenMarland KitchenNot anaphylaxis, but GI sensitivity.   Prior to Admission medications   Medication Sig Start Date End Date Taking? Authorizing Provider  albuterol (VENTOLIN HFA) 108 (90 Base) MCG/ACT inhaler INHALE 2 PUFFS INTO THE LUNGS EVERY 6 HOURS AS NEEDED FOR WHEEZING OR SHORTNESS OF BREATH 11/23/22  Yes  Burchette, Alinda Sierras, MD  AUVI-Q 0.3 MG/0.3ML SOAJ injection Inject 0.3 mg into the muscle as needed for anaphylaxis. 03/07/22  Yes Ambs, Kathrine Cords, FNP  citalopram (CELEXA) 20 MG tablet TAKE 1 TABLET(20 MG) BY MOUTH DAILY 11/17/22  Yes Burchette, Alinda Sierras, MD  clonazePAM (KLONOPIN) 0.5 MG tablet TAKE 1 TABLET(0.5 MG) BY MOUTH TWICE DAILY AS NEEDED FOR ANXIETY 11/20/22  Yes Farrel Conners, MD  levocetirizine (XYZAL) 5 MG tablet Take 1 tablet (5 mg total) by mouth every evening.  03/07/22  Yes Ambs, Kathrine Cords, FNP  sildenafil (VIAGRA) 100 MG tablet TAKE 1/2 TO 1 TABLET(50 TO 100 MG) BY MOUTH DAILY AS NEEDED FOR ERECTILE DYSFUNCTION Patient not taking: Reported on 11/29/2022 11/17/22   Eulas Post, MD  beclomethasone (QVAR REDIHALER) 80 MCG/ACT inhaler Inhale 1-2 puffs into the lungs daily. Rinse mouth after each use. Patient not taking: Reported on 11/29/2022 10/19/22   Garnet Sierras, DO  RESTASIS 0.05 % ophthalmic emulsion 1 drop 2 (two) times daily. Patient not taking: Reported on 11/29/2022 08/01/21   [provider]   Social History   Socioeconomic History   Marital status: Single    Spouse name: Not on file   Number of children: 0   Years of education: College   Highest education level: Not on file  Occupational History   Occupation: Sales executive  Tobacco Use   Smoking status: Never   Smokeless tobacco: Never  Vaping Use   Vaping Use: Never used  Substance and Sexual Activity   Alcohol use: Yes    Alcohol/week: 3.0 standard drinks of alcohol    Types: 3 Standard drinks or equivalent per week    Comment: occ   Drug use: Yes    Frequency: 2.0 times per week    Types: Marijuana   Sexual activity: Not on file  Other Topics Concern   Not on file  Social History Narrative   Lives alone   Caffeine use: 1 cup coffee per day   Right handed    Social Determinants of Health   Financial Resource Strain: Not on file  Food Insecurity: Not on file  Transportation Needs: Not on file  Physical Activity: Not on file  Stress: Not on file  Social Connections: Not on file  Intimate Partner Violence: Not on file   Review of Systems Per HPI.   Objective:   Vitals:   11/29/22 1152  BP: 118/84  Pulse: 70  Temp: 98.4 F (36.9 C)  TempSrc: Temporal  SpO2: 98%  Weight: (!) 354 lb (160.6 kg)  Height: '6\' 6"'$  (1.981 m)    Physical Exam Vitals reviewed.  Constitutional:      General: He is not in acute distress.    Appearance: Normal appearance. He  is well-developed.  HENT:     Head: Normocephalic and atraumatic.  Cardiovascular:     Rate and Rhythm: Normal rate.  Pulmonary:     Effort: Pulmonary effort is normal.  Skin:    Comments: Left lower back - small excoriated papule, no induration, discharge or surrounding erythema  Right inferior buttocks, other area of small amount of excoriation, no active bleeding or discharge, no induration, no surrounding erythema.  Photo of right shoulder, cystic structure with slight induration, no appreciable central fluctuance or surrounding erythema, no active discharge.  Minimal tenderness.  Neurological:     Mental Status: He is alert and oriented to person, place, and time.  Psychiatric:  Mood and Affect: Mood normal.        Assessment & Plan:  Bradley [name] is a 27 y.o. male . Epidermoid cyst of skin of back  -Improvement in size, swelling after injection and oral antibiotics from dermatology.  No appreciable fluctuant area at this time or significant surrounding erythema.  Recommended continuing antibiotic for full course, then follow-up with dermatology to decide on excision.  That may be better served once possible infection/inflammation calms down.  RTC precautions.  Skin irritation Left low back, right buttocks, no appreciable cystic structure, may have excoriated follicle/excoriated skin without sign of infection at this time.  Topical antibiotic is an option with RTC precautions.  No orders of the defined types were placed in this encounter.  Patient Instructions  Thanks for coming in today.  Glad to hear that the cyst is improving.  It does not look overly infected at this time, and the injection as well as antibiotics from dermatology appear to be helping.  I recommend continuing those until completion, and follow-up with dermatology to decide if excision of cyst is needed.  The area on the lower back and buttock do not appear to be a cyst but possible small skin  irritation.  Okay to apply topical antibiotic ointment to those areas but if that does not improve, please have those areas rechecked.  Please let me know if there are questions and take care.     Signed,   Merri Ray, MD [place], [facility] 11/29/22 12:53 PM

## 2022-11-30 ENCOUNTER — Encounter: Payer: Self-pay | Admitting: Family Medicine

## 2022-12-19 ENCOUNTER — Other Ambulatory Visit: Payer: Self-pay | Admitting: Family Medicine

## 2023-01-03 ENCOUNTER — Encounter: Payer: Self-pay | Admitting: Adult Health

## 2023-01-03 ENCOUNTER — Ambulatory Visit: Payer: 59 | Admitting: Adult Health

## 2023-01-03 VITALS — BP 120/82 | HR 70 | Temp 98.2°F | Ht 78.0 in | Wt 354.0 lb

## 2023-01-03 DIAGNOSIS — J45901 Unspecified asthma with (acute) exacerbation: Secondary | ICD-10-CM | POA: Diagnosis not present

## 2023-01-03 MED ORDER — IPRATROPIUM-ALBUTEROL 0.5-2.5 (3) MG/3ML IN SOLN
3.0000 mL | Freq: Once | RESPIRATORY_TRACT | Status: AC
  Administered 2023-01-03: 3 mL via RESPIRATORY_TRACT

## 2023-01-03 MED ORDER — PREDNISONE 10 MG PO TABS: ORAL_TABLET | ORAL | 0 refills | Status: DC

## 2023-01-03 NOTE — Progress Notes (Signed)
Subjective:    Patient ID: Bradley [name], male    DOB: June 12, 1996, 27 y.o.   MRN: [id number]  HPI  27 year old male who  has a past medical history of Allergy to alpha-gal, Anxiety, Chronic headaches, Clavicular fracture, Diverticulosis, History of chickenpox, Mild intermittent asthma (06/07/2018), Shingles, Tubular adenoma of colon, and Urticaria (05/18/2020).  He presents to the office today for wheezing and shortness of breath x 3 weeks. He has a history of asthma and uses an albuterol inhaler. He reports that his symptoms are worse in the morning and at night. The only relief he gets is with his inhaler which helps almost immediately but feels like he is using this inhaler too much. He has not been able to use his daily inhaler due to cost  He also finds that hot showers make it worse    Review of Systems See HPI   Past Medical History:  Diagnosis Date   Allergy to alpha-gal    Anxiety    Chronic headaches    Clavicular fracture    Diverticulosis    History of chickenpox    Mild intermittent asthma 06/07/2018   Shingles    27 years old.   Tubular adenoma of colon    Urticaria 05/18/2020    Social History   Socioeconomic History   Marital status: Single    Spouse name: Not on file   Number of children: 0   Years of education: College   Highest education level: Not on file  Occupational History   Occupation: Occupational hygienist  Tobacco Use   Smoking status: Never   Smokeless tobacco: Never  Vaping Use   Vaping Use: Never used  Substance and Sexual Activity   Alcohol use: Yes    Alcohol/week: 3.0 standard drinks of alcohol    Types: 3 Standard drinks or equivalent per week    Comment: occ   Drug use: Yes    Frequency: 2.0 times per week    Types: Marijuana   Sexual activity: Not on file  Other Topics Concern   Not on file  Social History Narrative   Lives alone   Caffeine use: 1 cup coffee per day   Right handed    Social Determinants of Health   Financial  Resource Strain: Not on file  Food Insecurity: Not on file  Transportation Needs: Not on file  Physical Activity: Not on file  Stress: Not on file  Social Connections: Not on file  Intimate Partner Violence: Not on file    Past Surgical History:  Procedure Laterality Date   COLONOSCOPY     CYSTECTOMY  01/2019   scrotum area    ESOPHAGEAL MANOMETRY N/A 06/09/2020   Procedure: ESOPHAGEAL MANOMETRY (EM);  Surgeon: Shellia Cleverly, DO;  Location: [facility];  Service: Gastroenterology;  Laterality: N/A;   ESOPHAGOGASTRODUODENOSCOPY     shoulder surgery Right 08/26/2019   WISDOM TOOTH EXTRACTION      Family History  Problem Relation Age of Onset   Hypertension Mother    Colon polyps Mother        might be mistakrn   Diabetes Father        type II   Migraines Sister    Colon cancer Neg Hx    Esophageal cancer Neg Hx    Rectal cancer Neg Hx    Stomach cancer Neg Hx     Allergies  Allergen Reactions   Shellfish Allergy Anaphylaxis   Hydrocodone Itching  Pt states he feels itchy after taking, but it doesn't preclude him taking hydrocodone if needed About 2-3 years ago.  It was the first time he took hydrocodone.   Other Diarrhea    Slightly allergic to red meat since tick bite.Marland KitchenMarland KitchenNot anaphylaxis, but GI sensitivity.    Current Outpatient Medications on File Prior to Visit  Medication Sig Dispense Refill   albuterol (VENTOLIN HFA) 108 (90 Base) MCG/ACT inhaler INHALE 2 PUFFS INTO THE LUNGS EVERY 6 HOURS AS NEEDED FOR WHEEZING OR SHORTNESS OF BREATH 18 g 2   AUVI-Q 0.3 MG/0.3ML SOAJ injection Inject 0.3 mg into the muscle as needed for anaphylaxis. 1 each 1   beclomethasone (QVAR REDIHALER) 80 MCG/ACT inhaler Inhale 1-2 puffs into the lungs daily. Rinse mouth after each use. 1 each 5   citalopram (CELEXA) 20 MG tablet TAKE 1 TABLET(20 MG) BY MOUTH DAILY 90 tablet 0   clonazePAM (KLONOPIN) 0.5 MG tablet TAKE 1 TABLET(0.5 MG) BY MOUTH TWICE DAILY AS NEEDED FOR ANXIETY 20  tablet 0   levocetirizine (XYZAL) 5 MG tablet Take 1 tablet (5 mg total) by mouth every evening. 30 tablet 5   RESTASIS 0.05 % ophthalmic emulsion 1 drop 2 (two) times daily.     sildenafil (VIAGRA) 100 MG tablet TAKE 1/2 TO 1 TABLET(50 TO 100 MG) BY MOUTH DAILY AS NEEDED FOR ERECTILE DYSFUNCTION 10 tablet 3   No current facility-administered medications on file prior to visit.    BP 120/82   Pulse 70   Ht 6\' 6"  (1.981 m)   Wt (!) 354 lb (160.6 kg)   SpO2 99%   BMI 40.91 kg/m       Objective:   Physical Exam Vitals and nursing note reviewed.  Constitutional:      Appearance: Normal appearance.  Cardiovascular:     Rate and Rhythm: Normal rate and regular rhythm.     Pulses: Normal pulses.     Heart sounds: Normal heart sounds.  Pulmonary:     Effort: Pulmonary effort is normal.     Breath sounds: Examination of the right-upper field reveals wheezing. Examination of the left-upper field reveals wheezing. Examination of the right-middle field reveals wheezing. Examination of the left-middle field reveals wheezing. Examination of the right-lower field reveals wheezing. Examination of the left-lower field reveals wheezing. Wheezing present.  Skin:    General: Skin is warm and dry.  Neurological:     General: No focal deficit present.     Mental Status: He is alert and oriented to person, place, and time.  Psychiatric:        Mood and Affect: Mood normal.        Behavior: Behavior normal.        Thought Content: Thought content normal.       Assessment & Plan:   1. Moderate asthma with exacerbation, unspecified whether persistent - Wheezing nearly resolved with duoneb nebulizer. Patient reported feeling that he could breath much easier. Will prescribe prednisone course and samples of Trelegy given.  - Can follow up with Asthma doctor as needed - predniSONE (DELTASONE) 10 MG tablet; 40 mg x 3 days, 20 mg x 3 days, 10 mg x 3 days  Dispense: 21 tablet; Refill: 0 -  ipratropium-albuterol (DUONEB) 0.5-2.5 (3) MG/3ML nebulizer solution 3 mL  Samples of Trelegy Ellipta 147mcg/62.77mcg/25 mcg were given to the patient, quantity 2, Lot Number [id number]     Shirline Frees, NP

## 2023-01-11 ENCOUNTER — Ambulatory Visit (INDEPENDENT_AMBULATORY_CARE_PROVIDER_SITE_OTHER): Payer: 59 | Admitting: Allergy

## 2023-01-11 ENCOUNTER — Encounter: Payer: Self-pay | Admitting: Allergy

## 2023-01-11 ENCOUNTER — Other Ambulatory Visit: Payer: Self-pay

## 2023-01-11 VITALS — BP 128/72 | HR 86 | Temp 98.0°F | Resp 20 | Ht 78.0 in | Wt 356.0 lb

## 2023-01-11 DIAGNOSIS — J3089 Other allergic rhinitis: Secondary | ICD-10-CM | POA: Diagnosis not present

## 2023-01-11 DIAGNOSIS — Z91018 Allergy to other foods: Secondary | ICD-10-CM

## 2023-01-11 DIAGNOSIS — J302 Other seasonal allergic rhinitis: Secondary | ICD-10-CM

## 2023-01-11 DIAGNOSIS — T7800XD Anaphylactic reaction due to unspecified food, subsequent encounter: Secondary | ICD-10-CM

## 2023-01-11 DIAGNOSIS — J4540 Moderate persistent asthma, uncomplicated: Secondary | ICD-10-CM | POA: Diagnosis not present

## 2023-01-11 DIAGNOSIS — K219 Gastro-esophageal reflux disease without esophagitis: Secondary | ICD-10-CM

## 2023-01-11 DIAGNOSIS — H1010 Acute atopic conjunctivitis, unspecified eye: Secondary | ICD-10-CM

## 2023-01-11 DIAGNOSIS — H1013 Acute atopic conjunctivitis, bilateral: Secondary | ICD-10-CM

## 2023-01-11 MED ORDER — ALBUTEROL SULFATE HFA 108 (90 BASE) MCG/ACT IN AERS: 2.0000 | INHALATION_SPRAY | RESPIRATORY_TRACT | 1 refills | Status: DC | PRN

## 2023-01-11 MED ORDER — FLUTICASONE FUROATE-VILANTEROL 100-25 MCG/ACT IN AEPB: 1.0000 | INHALATION_SPRAY | Freq: Every day | RESPIRATORY_TRACT | 3 refills | Status: DC

## 2023-01-11 MED ORDER — ALBUTEROL SULFATE (2.5 MG/3ML) 0.083% IN NEBU: 2.5000 mg | INHALATION_SOLUTION | RESPIRATORY_TRACT | 1 refills | Status: AC | PRN

## 2023-01-11 NOTE — Assessment & Plan Note (Signed)
Continue dietary and lifestyle modifications Take omeprazole  in the morning. Nothing to eat or drink for 30 min afterwards.

## 2023-01-11 NOTE — Assessment & Plan Note (Signed)
Interim history - wheezing the past few months, now on prednisone with some benefit. No daily ICS inhaler. Today's spirometry was normal. Finish prednisone.  Daily controller medication(s): Start Breo 1 puff once a day and rinse mouth after each use. Demonstrated proper use. If not covered let us know.  Spacer given and demonstrated proper use with inhaler. Patient understood technique and all questions/concerned were addressed.  Nebulizer machine given.  May use albuterol rescue inhaler 2 puffs or nebulizer every 4 to 6 hours as needed for shortness of breath, chest tightness, coughing, and wheezing. Monitor frequency of use.  Get spirometry at next visit.

## 2023-01-11 NOTE — Progress Notes (Signed)
Follow Up Note  RE: Bradley [name] MRN: Bradley [name]: Jul 05, 1996 Date of Office Visit: 01/11/2023  Referring provider: [name], MD Primary care provider: [name], MD  Chief Complaint: Other (Pt states he has been having some whezzing for about a month or two coming and going.)  History of Present Illness: I had the pleasure of seeing Bradley [name] for a follow up visit at the [facility] on 01/11/2023. He is a 27 y.o. male, who is being followed for asthma, allergic rhinoconjunctivitis, alpha gal allergy, food allergy, GERD. His previous allergy office visit was on 10/19/2022 with Dr. Selena [name]. Today is a regular follow up visit.  Asthma  Patient noted some increased wheezing the past few months. Hot showers seems to be making it worse.  Patient saw PCP and had nebulizer tx and prednisone which helped the wheezing. Wants to know if he can have a nebulizer machine. No spacer at home.   Using albuterol daily. Patient tried Alvesco and not sure if it's helping. Has not been taking it regularly.   Seasonal and perennial allergic rhinoconjunctivitis Increased nasal drainage.  Takes Xyzal daily, not taking nasal spray regularly.  Question if Singulair caused issues with his pancreas in the past?   Food allergy Avoiding red meat and shellfish.  Tolerates oyster, crab, lobster with no issues.  No clinical reaction to shrimp that he can remember.   Gastroesophageal reflux disease Noticed when GERD flares his asthma gets worse.   Assessment and Plan: Bradley [name] is a 27 y.o. male with: Not well controlled moderate persistent asthma Interim history - wheezing the past few months, now on prednisone with some benefit. No daily ICS inhaler. Today's spirometry was normal. Finish prednisone.  Daily controller medication(s): Start Breo 1 puff once a day and rinse mouth after each use. Demonstrated proper use. If not covered let us know.  Spacer  given and demonstrated proper use with inhaler. Patient understood technique and all questions/concerned were addressed.  Nebulizer machine given.  May use albuterol rescue inhaler 2 puffs or nebulizer every 4 to 6 hours as needed for shortness of breath, chest tightness, coughing, and wheezing. Monitor frequency of use.  Get spirometry at next visit.  Seasonal and perennial allergic rhinoconjunctivitis Past history - 2019 blood work was positive to dust mites, cat, dog, grass, tree, borderline to ragweed, cockroach. Started AIT on 02/14/2018 (Mite-Cockroach-Ragweed & Pollen-Pet). Stopped AIT in 2021 Interim history - increased symptoms. Singulair caused some issues with pancreas in the past? Use over the counter antihistamines such as Zyrtec (cetirizine), Claritin (loratadine), Allegra (fexofenadine), or Xyzal (levocetirizine) daily as needed. May take twice a day during allergy flares. May switch antihistamines every few months. Use Flonase (fluticasone) nasal spray 1 spray per nostril twice a day as needed for nasal congestion.  Food allergy Past history - 2020 bloodwork positive to shellfish. Interim history - no issues with oysters, lobsters, crab and dairy products. 2024 bloodwork alpha gal still positive, shrimp borderline.  Avoid red meat and shellfish. For mild symptoms you can take over the counter antihistamines such as Benadryl and monitor symptoms closely. If symptoms worsen or if you have severe symptoms including breathing issues, throat closure, significant swelling, whole body hives, severe diarrhea and vomiting, lightheadedness then inject epinephrine and seek immediate medical care afterwards. If interested we can schedule food challenge to shellfish - shrimp. You must be off antihistamines for 3-5 days before. Must be in good health and not ill.  No vaccines/injections/antibiotics within the past 7 days. Plan on being in the office for 2-3 hours and must bring in the food you want  to do the oral challenge for - 1 serving of cooked shrimp. You must call to schedule an appointment and specify it's for a food challenge.   Gastroesophageal reflux disease Continue dietary and lifestyle modifications Take omeprazole 20mg  in the morning. Nothing to eat or drink for 30 min afterwards.   Return in about 2 months (around 03/13/2023).  Meds ordered this encounter  Medications   fluticasone furoate-vilanterol (BREO ELLIPTA) 100-25 MCG/ACT AEPB    Sig: Inhale 1 puff into the lungs daily. Rinse mouth after each use.    Dispense:  60 each    Refill:  3   albuterol (VENTOLIN HFA) 108 (90 Base) MCG/ACT inhaler    Sig: Inhale 2 puffs into the lungs every 4 (four) hours as needed for wheezing or shortness of breath (coughing fits).    Dispense:  18 g    Refill:  1   albuterol (PROVENTIL) (2.5 MG/3ML) 0.083% nebulizer solution    Sig: Take 3 mLs (2.5 mg total) by nebulization every 4 (four) hours as needed for wheezing or shortness of breath (coughing fits).    Dispense:  75 mL    Refill:  1   Lab Orders  No laboratory test(s) ordered today    Diagnostics: Spirometry:  Tracings reviewed. His effort: Good reproducible efforts. FVC: 6.37L FEV1: 4.96L, 106% predicted FEV1/FVC ratio: 78% Interpretation: Spirometry consistent with normal pattern.  Please see scanned spirometry results for details.  Medication List:  Current Outpatient Medications  Medication Sig Dispense Refill   albuterol (PROVENTIL) (2.5 MG/3ML) 0.083% nebulizer solution Take 3 mLs (2.5 mg total) by nebulization every 4 (four) hours as needed for wheezing or shortness of breath (coughing fits). 75 mL 1   albuterol (VENTOLIN HFA) 108 (90 Base) MCG/ACT inhaler Inhale 2 puffs into the lungs every 4 (four) hours as needed for wheezing or shortness of breath (coughing fits). 18 g 1   AUVI-Q 0.3 MG/0.3ML SOAJ injection Inject 0.3 mg into the muscle as needed for anaphylaxis. 1 each 1   citalopram (CELEXA) 20 MG  tablet TAKE 1 TABLET(20 MG) BY MOUTH DAILY 90 tablet 0   clonazePAM (KLONOPIN) 0.5 MG tablet TAKE 1 TABLET(0.5 MG) BY MOUTH TWICE DAILY AS NEEDED FOR ANXIETY 20 tablet 0   fluticasone furoate-vilanterol (BREO ELLIPTA) 100-25 MCG/ACT AEPB Inhale 1 puff into the lungs daily. Rinse mouth after each use. 60 each 3   levocetirizine (XYZAL) 5 MG tablet Take 1 tablet (5 mg total) by mouth every evening. 30 tablet 5   predniSONE (DELTASONE) 10 MG tablet 40 mg x 3 days, 20 mg x 3 days, 10 mg x 3 days 21 tablet 0   RESTASIS 0.05 % ophthalmic emulsion 1 drop 2 (two) times daily.     sildenafil (VIAGRA) 100 MG tablet TAKE 1/2 TO 1 TABLET(50 TO 100 MG) BY MOUTH DAILY AS NEEDED FOR ERECTILE DYSFUNCTION (Patient not taking: Reported on 01/11/2023) 10 tablet 3   No current facility-administered medications for this visit.   Allergies: Allergies  Allergen Reactions   Shellfish Allergy Anaphylaxis   Hydrocodone Itching    Pt states he feels itchy after taking, but it doesn't preclude him taking hydrocodone if needed About 2-3 years ago.  It was the first time he took hydrocodone.   Other Diarrhea    Slightly allergic to red meat since tick bite.Marland KitchenMarland KitchenNot anaphylaxis, but  GI sensitivity.   I reviewed his past medical history, social history, family history, and environmental history and no significant changes have been reported from his previous visit.  Review of Systems  Constitutional:  Negative for appetite change, chills, fever and unexpected weight change.  HENT:  Negative for congestion and rhinorrhea.   Eyes:  Negative for itching.  Respiratory:  Positive for wheezing. Negative for cough, chest tightness and shortness of breath.   Gastrointestinal:  Negative for abdominal pain.  Skin:  Negative for rash.  Allergic/Immunologic: Positive for environmental allergies and food allergies.  Neurological:  Negative for headaches.    Objective: BP 128/72   Pulse 86   Temp 98 F (36.7 C)   Resp 20   Ht   (1.981 m)   Wt (!) 356 lb (161.5 kg)   SpO2 98%   BMI 41.14 kg/m  Body mass index is 41.14 kg/m. Physical Exam Vitals and nursing note reviewed.  Constitutional:      Appearance: Normal appearance. He is well-developed. He is obese.  HENT:     Head: Normocephalic and atraumatic.     Right Ear: Tympanic membrane and external ear normal.     Left Ear: Tympanic membrane and external ear normal.     Nose: Nose normal.     Mouth/Throat:     Mouth: Mucous membranes are moist.     Pharynx: Oropharynx is clear.  Eyes:     Conjunctiva/sclera: Conjunctivae normal.  Cardiovascular:     Rate and Rhythm: Normal rate and regular rhythm.     Heart sounds: Normal heart sounds. No murmur heard. Pulmonary:     Effort: Pulmonary effort is normal.     Breath sounds: Normal breath sounds. No wheezing, rhonchi or rales.  Musculoskeletal:     Cervical back: Neck supple.  Skin:    General: Skin is warm.     Findings: No rash.  Neurological:     Mental Status: He is alert and oriented to person, place, and time.  Psychiatric:        Behavior: Behavior normal.    Previous notes and tests were reviewed. The plan was reviewed with the patient/family, and all questions/concerned were addressed.  It was my pleasure to see Bradley [name] today and participate in his care. Please feel free to contact me with any questions or concerns.  Sincerely,  Wyline Mood, DO Allergy & Immunology  [facility] office: [phone] [facility] office: [phone]

## 2023-01-11 NOTE — Patient Instructions (Addendum)
Asthma Finish prednisone.  Daily controller medication(s): Start Breo 1 puff once a day and rinse mouth after each use. Demonstrated proper use. If not covered let us know.  Spacer given and demonstrated proper use with inhaler. Patient understood technique and all questions/concerned were addressed.  Nebulizer machine given.  May use albuterol rescue inhaler 2 puffs or nebulizer every 4 to 6 hours as needed for shortness of breath, chest tightness, coughing, and wheezing. Monitor frequency of use.  Breathing control goals:  Full participation in all desired activities (may need albuterol before activity) Albuterol use two times or less a week on average (not counting use with activity) Cough interfering with sleep two times or less a month Oral steroids no more than once a year No hospitalizations   Food allergy Avoid red meat and shellfish. For mild symptoms you can take over the counter antihistamines such as Benadryl and monitor symptoms closely. If symptoms worsen or if you have severe symptoms including breathing issues, throat closure, significant swelling, whole body hives, severe diarrhea and vomiting, lightheadedness then inject epinephrine and seek immediate medical care afterwards.  If interested we can schedule food challenge to shellfish - shrimp. You must be off antihistamines for 3-5 days before. Must be in good health and not ill. No vaccines/injections/antibiotics within the past 7 days. Plan on being in the office for 2-3 hours and must bring in the food you want to do the oral challenge for - 1 serving of cooked shrimp. You must call to schedule an appointment and specify it's for a food challenge.   Environmental allergies Use over the counter antihistamines such as Zyrtec (cetirizine), Claritin (loratadine), Allegra (fexofenadine), or Xyzal (levocetirizine) daily as needed. May take twice a day during allergy flares. May switch antihistamines every few months. Use  Flonase (fluticasone) nasal spray 1 spray per nostril twice a day as needed for nasal congestion.   Heartburn: Continue lifestyle and dietary modifications. Take omeprazole  in the morning. Nothing to eat or drink for 30 min afterwards.   Follow up in 2 months or sooner if needed.

## 2023-01-11 NOTE — Assessment & Plan Note (Signed)
Past history - 2020 bloodwork positive to shellfish. Interim history - no issues with oysters, lobsters, crab and dairy products. 2024 bloodwork alpha gal still positive, shrimp borderline.  Avoid red meat and shellfish. For mild symptoms you can take over the counter antihistamines such as Benadryl and monitor symptoms closely. If symptoms worsen or if you have severe symptoms including breathing issues, throat closure, significant swelling, whole body hives, severe diarrhea and vomiting, lightheadedness then inject epinephrine and seek immediate medical care afterwards. If interested we can schedule food challenge to shellfish - shrimp. You must be off antihistamines for 3-5 days before. Must be in good health and not ill. No vaccines/injections/antibiotics within the past 7 days. Plan on being in the office for 2-3 hours and must bring in the food you want to do the oral challenge for - 1 serving of cooked shrimp. You must call to schedule an appointment and specify it's for a food challenge.

## 2023-01-11 NOTE — Assessment & Plan Note (Signed)
Past history - 2019 blood work was positive to dust mites, cat, dog, grass, tree, borderline to ragweed, cockroach. Started AIT on 02/14/2018 (Mite-Cockroach-Ragweed & Pollen-Pet). Stopped AIT in 2021 Interim history - increased symptoms. Singulair caused some issues with pancreas in the past? Use over the counter antihistamines such as Zyrtec (cetirizine), Claritin (loratadine), Allegra (fexofenadine), or Xyzal (levocetirizine) daily as needed. May take twice a day during allergy flares. May switch antihistamines every few months. Use Flonase (fluticasone) nasal spray 1 spray per nostril twice a day as needed for nasal congestion.

## 2023-01-18 ENCOUNTER — Other Ambulatory Visit: Payer: Self-pay | Admitting: Family Medicine

## 2023-01-18 NOTE — Telephone Encounter (Signed)
Pt  LOV was on 01/03/23 Last refill was done on 12/19/22 Please advise

## 2023-01-23 ENCOUNTER — Ambulatory Visit: Payer: 59 | Admitting: Family Medicine

## 2023-01-23 ENCOUNTER — Encounter: Payer: Self-pay | Admitting: Family Medicine

## 2023-01-23 ENCOUNTER — Ambulatory Visit: Payer: 59 | Admitting: Internal Medicine

## 2023-01-23 VITALS — BP 122/80 | HR 78 | Temp 97.8°F | Resp 18 | Ht 78.0 in | Wt 356.1 lb

## 2023-01-23 DIAGNOSIS — H9201 Otalgia, right ear: Secondary | ICD-10-CM

## 2023-01-23 MED ORDER — PREDNISONE 20 MG PO TABS: 40.0000 mg | ORAL_TABLET | Freq: Every day | ORAL | 0 refills | Status: AC

## 2023-01-23 NOTE — Patient Instructions (Signed)
See ENT about nose  Flonase  prednisone

## 2023-01-23 NOTE — Progress Notes (Signed)
Subjective:     Patient ID: Bradley [name], male    DOB: 1996/07/20, 27 y.o.   MRN: [id number]  Chief Complaint  Patient presents with   Ear Pain    Right ear pain that started about 1 week ago     HPI Right ear pain for 1 week(s)-using some rubbing alcohol.  Getting worse.  No fevers/chills some runny nose.  Not dizzy.  No drainage from ear. No sore throat.   Inside of nose-sore spot, mupirocin from ENT in past but still there.    Asthma-saw allergist-started breo(hasn't done yet).   Finished prednisone few weeks ago for flare  There are no preventive care reminders to display for this patient.  Past Medical History:  Diagnosis Date   Allergy to alpha-gal    Anxiety    Chronic headaches    Clavicular fracture    Diverticulosis    History of chickenpox    Mild intermittent asthma 06/07/2018   Shingles    27 years old.   Tubular adenoma of colon    Urticaria 05/18/2020    Past Surgical History:  Procedure Laterality Date   COLONOSCOPY     CYSTECTOMY  01/2019   scrotum area    ESOPHAGEAL MANOMETRY N/A 06/09/2020   Procedure: ESOPHAGEAL MANOMETRY (EM);  Surgeon: Shellia Cleverly, DO;  Location: [facility];  Service: Gastroenterology;  Laterality: N/A;   ESOPHAGOGASTRODUODENOSCOPY     shoulder surgery Right 08/26/2019   WISDOM TOOTH EXTRACTION       Current Outpatient Medications:    albuterol (PROVENTIL) (2.5 MG/3ML) 0.083% nebulizer solution, Take 3 mLs (2.5 mg total) by nebulization every 4 (four) hours as needed for wheezing or shortness of breath (coughing fits)., Disp: 75 mL, Rfl: 1   albuterol (VENTOLIN HFA) 108 (90 Base) MCG/ACT inhaler, Inhale 2 puffs into the lungs every 4 (four) hours as needed for wheezing or shortness of breath (coughing fits)., Disp: 18 g, Rfl: 1   AUVI-Q 0.3 MG/0.3ML SOAJ injection, Inject 0.3 mg into the muscle as needed for anaphylaxis., Disp: 1 each, Rfl: 1   citalopram (CELEXA) 20 MG tablet, TAKE 1 TABLET(20 MG) BY MOUTH DAILY,  Disp: 90 tablet, Rfl: 0   clonazePAM (KLONOPIN) 0.5 MG tablet, TAKE 1 TABLET(0.5 MG) BY MOUTH TWICE DAILY AS NEEDED FOR ANXIETY, Disp: 60 tablet, Rfl: 1   fluticasone furoate-vilanterol (BREO ELLIPTA) 100-25 MCG/ACT AEPB, Inhale 1 puff into the lungs daily. Rinse mouth after each use., Disp: 60 each, Rfl: 3   levocetirizine (XYZAL) 5 MG tablet, Take 1 tablet (5 mg total) by mouth every evening., Disp: 30 tablet, Rfl: 5   predniSONE (DELTASONE) 20 MG tablet, Take 2 tablets (40 mg total) by mouth daily with breakfast for 5 days., Disp: 10 tablet, Rfl: 0   sildenafil (VIAGRA) 100 MG tablet, TAKE 1/2 TO 1 TABLET(50 TO 100 MG) BY MOUTH DAILY AS NEEDED FOR ERECTILE DYSFUNCTION, Disp: 10 tablet, Rfl: 3  Allergies  Allergen Reactions   Shellfish Allergy Anaphylaxis   Hydrocodone Itching    Pt states he feels itchy after taking, but it doesn't preclude him taking hydrocodone if needed About 2-3 years ago.  It was the first time he took hydrocodone.   Other Diarrhea    Slightly allergic to red meat since tick bite.Marland KitchenMarland KitchenNot anaphylaxis, but GI sensitivity.   ROS neg/noncontributory except as noted HPI/below      Objective:     BP 122/80   Pulse 78   Temp 97.8 F (36.6 C) (  Temporal)   Resp 18   Ht 6\' 6"  (1.981 m)   Wt (!) 356 lb 2 oz (161.5 kg)   SpO2 97%   BMI 41.15 kg/m  Wt Readings from Last 3 Encounters:  01/23/23 (!) 356 lb 2 oz (161.5 kg)  01/11/23 (!) 356 lb (161.5 kg)  01/03/23 (!) 354 lb (160.6 kg)    Physical Exam   Gen: WDWN NAD HEENT: NCAT, conjunctiva not injected, sclera nonicteric TM WNL B, OP moist, no exudates  no tenderness TMJ but tender right angle of jaw.  Still white area inside right nare Neck: no nodes LUNGS: CTAB. No wheezes-few squeeks R EXT:  no edema MSK: no gross abnormalities.  NEURO: A&O x3.  CN II-XII intact.  PSYCH: normal mood. Good eye contact     Assessment & Plan:  Right ear pain  Other orders -     predniSONE; Take 2 tablets (40 mg total)  by mouth daily with breakfast for 5 days.  Dispense: 10 tablet; Refill: 0   Right ear pain-suspect eustachian tube dysfunction or referred pain.  No infection.  Prednisone 40 mg daily Follow up ENT right nare.   Angelena Sole, MD

## 2023-02-01 ENCOUNTER — Encounter: Payer: Self-pay | Admitting: Allergy

## 2023-02-04 ENCOUNTER — Ambulatory Visit
Admission: EM | Admit: 2023-02-04 | Discharge: 2023-02-04 | Disposition: A | Discharge status: Home / Self Care | Payer: 59 | Attending: Physician Assistant | Admitting: Physician Assistant

## 2023-02-04 ENCOUNTER — Encounter: Payer: Self-pay | Admitting: Emergency Medicine

## 2023-02-04 DIAGNOSIS — X58XXXA Exposure to other specified factors, initial encounter: Secondary | ICD-10-CM | POA: Diagnosis not present

## 2023-02-04 DIAGNOSIS — T148XXA Other injury of unspecified body region, initial encounter: Secondary | ICD-10-CM | POA: Diagnosis not present

## 2023-02-04 DIAGNOSIS — J029 Acute pharyngitis, unspecified: Secondary | ICD-10-CM

## 2023-02-04 DIAGNOSIS — Z1152 Encounter for screening for COVID-19: Secondary | ICD-10-CM | POA: Diagnosis not present

## 2023-02-04 LAB — POCT RAPID STREP A (OFFICE): Rapid Strep A Screen: NEGATIVE

## 2023-02-04 MED ORDER — LIDOCAINE VISCOUS HCL 2 % MT SOLN: 15.0000 mL | OROMUCOSAL | 0 refills | Status: DC | PRN

## 2023-02-04 MED ORDER — MUPIROCIN 2 % EX OINT: 1.0000 | TOPICAL_OINTMENT | Freq: Two times a day (BID) | CUTANEOUS | 0 refills | Status: DC

## 2023-02-04 MED ORDER — PREDNISONE 10 MG (21) PO TBPK: ORAL_TABLET | ORAL | 0 refills | Status: DC

## 2023-02-04 NOTE — ED Provider Notes (Signed)
[name]    CSN: [id number] Arrival date & time: 02/04/23  0759      History   Chief Complaint Chief Complaint  Patient presents with   Sore Throat    HPI Bradley [name] is a 27 y.o. male.   Patient presents today with a 2-day history of worsening sore throat.  He reports that sore throat pain is rated 9 on a 0-10 pain scale, described as "razor blades in his throat", worse with swallowing, no alleviating factors identified.  He denies any swelling of his throat, shortness of breath, muffled voice, dysphagia.  He is able to eat and drink normally.  He initially thought symptoms were related to acid reflux as he does have a history of this but they have worsened prompting evaluation.  He has not taken any medication for acid reflux or other over-the-counter medications to manage symptoms.  He does report an episode of chills but denies any fever, nausea, vomiting, cough, congestion, chest pain, shortness of breath.  Denies any recent antibiotic use.  He does report that he has had several courses of steroids in the past 90 days for asthma.  Last treatment was approximately 2 to 3 weeks ago.    In addition, patient reports a several week history of wound on his left lower back.  Reports that he pulled an ingrown hair out of this area and has continued to be red and bothersome.  The erythema has not expanded and lesion has not enlarged since onset.  He reports that it is mildly tender to palpation.  Denies any active bleeding or drainage.  Denies any history of recurrent skin infections or MRSA.    Past Medical History:  Diagnosis Date   Allergy to alpha-gal    Anxiety    Chronic headaches    Clavicular fracture    Diverticulosis    History of chickenpox    Mild intermittent asthma 06/07/2018   Shingles    27 years old.   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   Not well controlled moderate persistent asthma 09/30/2021   Lesion of palate 09/06/2021   Allergy to alpha-gal 06/16/2021   Anal fissure 03/16/2021   Right ankle pain 07/06/2020   Dysphagia    Gastroesophageal reflux disease 05/19/2020   Urticaria 05/18/2020   Seasonal and perennial allergic rhinoconjunctivitis 04/19/2020   Mild persistent asthma, uncomplicated 03/04/2020   Low back pain 12/18/2019   Shortness of breath 10/15/2019   Food allergy 02/07/2019   Hamstring injury, right, initial encounter 01/23/2019   Migraine without aura and without status migrainosus, not intractable 07/10/2018   Morbid obesity with BMI of 40.0-44.9, adult (HCC) 07/10/2018   Mild intermittent asthma without complication 06/07/2018   Atypical chest pain 12/21/2017   Generalized abdominal pain 12/17/2017   Anxiety 11/28/2017    Past Surgical History:  Procedure Laterality Date   COLONOSCOPY     CYSTECTOMY  01/2019   scrotum area    ESOPHAGEAL MANOMETRY N/A 06/09/2020   Procedure: ESOPHAGEAL MANOMETRY (EM);  Surgeon: Shellia Cleverly, DO;  Location: [facility];  Service: Gastroenterology;  Laterality: N/A;   ESOPHAGOGASTRODUODENOSCOPY     shoulder surgery Right 08/26/2019   WISDOM TOOTH EXTRACTION         Home Medications    Prior  to Admission medications   Medication Sig Start Date End Date Taking? Authorizing Provider  albuterol (PROVENTIL) (2.5 MG/3ML) 0.083% nebulizer solution Take 3 mLs (2.5 mg total) by nebulization every 4 (four) hours as needed for wheezing or shortness of breath (coughing fits). 01/11/23  Yes Ellamae Sia, DO  albuterol (VENTOLIN HFA) 108 (90 Base) MCG/ACT inhaler Inhale 2 puffs into the lungs every 4 (four) hours as needed for wheezing or shortness of breath (coughing fits). 01/11/23  Yes Ellamae Sia, DO  AUVI-Q 0.3 MG/0.3ML SOAJ injection Inject 0.3 mg into the muscle as needed for  anaphylaxis. 03/07/22  Yes Ambs, Norvel Richards, FNP  citalopram (CELEXA) 20 MG tablet TAKE 1 TABLET(20 MG) BY MOUTH DAILY 11/17/22  Yes Burchette, Elberta Fortis, MD  fluticasone furoate-vilanterol (BREO ELLIPTA) 100-25 MCG/ACT AEPB Inhale 1 puff into the lungs daily. Rinse mouth after each use. 01/11/23  Yes Ellamae Sia, DO  levocetirizine (XYZAL) 5 MG tablet Take 1 tablet (5 mg total) by mouth every evening. 03/07/22  Yes Ambs, Norvel Richards, FNP  lidocaine (XYLOCAINE) 2 % solution Use as directed 15 mLs in the mouth or throat every 4 (four) hours as needed for mouth pain. 02/04/23  Yes Kerry-Anne Mezo, Denny Peon K, PA-C  mupirocin ointment (BACTROBAN) 2 % Apply 1 Application topically 2 (two) times daily. 02/04/23  Yes Zonia Caplin, Noberto Retort, PA-C  predniSONE (STERAPRED UNI-PAK 21 TAB) 10 MG (21) TBPK tablet As directed 02/04/23  Yes Tayli Buch K, PA-C  clonazePAM (KLONOPIN) 0.5 MG tablet TAKE 1 TABLET(0.5 MG) BY MOUTH TWICE DAILY AS NEEDED FOR ANXIETY 01/19/23   Nelwyn Salisbury, MD  sildenafil (VIAGRA) 100 MG tablet TAKE 1/2 TO 1 TABLET(50 TO 100 MG) BY MOUTH DAILY AS NEEDED FOR ERECTILE DYSFUNCTION 11/17/22   Burchette, Elberta Fortis, MD    Family History Family History  Problem Relation Age of Onset   Hypertension Mother    Colon polyps Mother        might be mistakrn   Diabetes Father        type II   Migraines Sister    Colon cancer Neg Hx    Esophageal cancer Neg Hx    Rectal cancer Neg Hx    Stomach cancer Neg Hx     Social History Social History   Tobacco Use   Smoking status: Never   Smokeless tobacco: Never  Vaping Use   Vaping Use: Never used  Substance Use Topics   Alcohol use: Yes    Alcohol/week: 3.0 standard drinks of alcohol    Types: 3 Standard drinks or equivalent per week    Comment: occ   Drug use: Yes    Frequency: 2.0 times per week    Types: Marijuana     Allergies   Shellfish allergy, Hydrocodone, and Other   Review of Systems Review of Systems  Constitutional:  Positive for activity change,  chills and fatigue. Negative for appetite change and fever.  HENT:  Positive for ear pain (Bilateral), sore throat and trouble swallowing. Negative for congestion, sinus pressure, sneezing and voice change.   Respiratory:  Negative for cough and shortness of breath.   Cardiovascular:  Negative for chest pain.  Gastrointestinal:  Negative for abdominal pain, diarrhea, nausea and vomiting.  Skin:  Positive for wound.  Neurological:  Negative for dizziness, light-headedness and headaches.     Physical Exam Triage Vital Signs ED Triage Vitals  Enc Vitals Group     BP 02/04/23 0814 134/83  Pulse Rate 02/04/23 0814 82     Resp 02/04/23 0814 18     Temp 02/04/23 0814 98.6 F (37 C)     Temp Source 02/04/23 0814 Oral     SpO2 02/04/23 0814 95 %     Weight 02/04/23 0815 (!) 340 lb (154.2 kg)     Height 02/04/23 0815 6\' 6"  (1.981 m)     Head Circumference --      Peak Flow --      Pain Score 02/04/23 0815 9     Pain Loc --      Pain Edu? --      Excl. in GC? --    No data found.  Updated Vital Signs BP 134/83 (BP Location: Right Arm)   Pulse 82   Temp 98.6 F (37 C) (Oral)   Resp 18   Ht 6\' 6"  (1.981 m)   Wt (!) 340 lb (154.2 kg)   SpO2 95%   BMI 39.29 kg/m   Visual Acuity Right Eye Distance:   Left Eye Distance:   Bilateral Distance:    Right Eye Near:   Left Eye Near:    Bilateral Near:     Physical Exam Vitals reviewed.  Constitutional:      General: He is awake.     Appearance: Normal appearance. He is well-developed. He is not ill-appearing.     Comments: Very pleasant male appears stated age in no acute distress sitting comfortably in exam room  HENT:     Head: Normocephalic and atraumatic.     Right Ear: Tympanic membrane, ear canal and external ear normal. Tympanic membrane is not erythematous or bulging.     Left Ear: Tympanic membrane, ear canal and external ear normal. Tympanic membrane is not erythematous or bulging.     Nose: Nose normal.      Mouth/Throat:     Pharynx: Uvula midline. Posterior oropharyngeal erythema present. No oropharyngeal exudate.     Tonsils: No tonsillar exudate or tonsillar abscesses.  Cardiovascular:     Rate and Rhythm: Normal rate and regular rhythm.     Heart sounds: Normal heart sounds, S1 normal and S2 normal. No murmur heard. Pulmonary:     Effort: Pulmonary effort is normal. No accessory muscle usage or respiratory distress.     Breath sounds: Normal breath sounds. No stridor. No wheezing, rhonchi or rales.     Comments: Clear to auscultation bilaterally Lymphadenopathy:     Head:     Right side of head: No submental, submandibular or tonsillar adenopathy.     Left side of head: No submental, submandibular or tonsillar adenopathy.     Cervical: No cervical adenopathy.  Neurological:     Mental Status: He is alert.  Psychiatric:        Behavior: Behavior is cooperative.      UC Treatments / Results  Labs (all labs ordered are listed, but only abnormal results are displayed) Labs Reviewed  CULTURE, GROUP A STREP ([facility])  SARS CORONAVIRUS 2 (TAT 6-24 HRS)  POCT RAPID STREP A (OFFICE)    EKG   Radiology No results found.  Procedures Procedures (including critical care time)  Medications Ordered in UC Medications - No data to display  Initial Impression / Assessment and Plan / UC Course  I have reviewed the triage vital signs and the nursing notes.  Pertinent labs & imaging results that were available during my care of the patient were reviewed by me and considered in my  medical decision making (see chart for details).     Patient is well-appearing, afebrile, nontoxic, nontachycardic.  Strep testing was negative in clinic.  Will send this for throat culture but defer antibiotics until culture results are available.  We discussed likely viral etiology.  Mono testing was deferred as he has only been symptomatic for a few days.  COVID-19 testing was obtained and is pending.  We  discussed that there is no need for antibiotics.  He does report that pain is severe and rated 10 on a 0-10 pain scale so we will use steroids to help manage symptoms.  Discussed that he is not to take NSAIDs with this medication.  He was also prescribed viscous lidocaine with instruction not to eat or drink immediately after using this medication as it can cause an increased risk of choking.  He is to rest and drink plenty of fluid.  Discussed that if symptoms or not improving or if anything worsens and he has fever, dysphagia, swelling of his throat, shortness of breath he needs to be seen immediately.  Strict return precautions given.  Patient declined work excuse note.  Wound appears to be healing appropriately with no evidence of significant infection.  He was encouraged to keep this area clean with soap and water and apply Bactroban ointment twice daily.  He is to monitor the area and if this enlarges or changes in any way he should return for reevaluation.  Final Clinical Impressions(s) / UC Diagnoses   Final diagnoses:  Pharyngitis, unspecified etiology  Sore throat  Wound of skin     Discharge Instructions      Your strep test was negative.  We will contact you if your throat culture is positive.  We swabbed you for COVID and we will contact you if this is positive as well.  Gargle with warm salt water multiple times a day.  Use Tylenol for pain relief.  We are starting a steroid taper since you have having such significant pain and swelling.  Take this as prescribed and do not take NSAIDs with this medication including aspirin, ibuprofen/Advil, naproxen/Aleve.  Gargle with viscous lidocaine every 4-6 hours as needed.  Do not eat or drink immediately after using this medication as able because increased risk of choking.  If your symptoms or not improving or if anything worsens and you have difficulty swallowing, shortness of breath, swelling of your throat, fever you should be seen  immediately.  The area on your back does not appear to be significantly infected.  I do recommend that you keep the area clean and apply Bactroban ointment twice daily.  If this area expands or worsens you should return for reevaluation.     ED Prescriptions     Medication Sig Dispense Auth. Provider   predniSONE (STERAPRED UNI-PAK 21 TAB) 10 MG (21) TBPK tablet As directed 21 tablet Dezarai Prew K, PA-C   lidocaine (XYLOCAINE) 2 % solution Use as directed 15 mLs in the mouth or throat every 4 (four) hours as needed for mouth pain. 100 mL Cristoval Teall K, PA-C   mupirocin ointment (BACTROBAN) 2 % Apply 1 Application topically 2 (two) times daily. 22 g Rynell Ciotti K, PA-C      PDMP not reviewed this encounter.   Jeani Hawking, PA-C 02/04/23 [phone]

## 2023-02-04 NOTE — Discharge Instructions (Signed)
Your strep test was negative.  We will contact you if your throat culture is positive.  We swabbed you for COVID and we will contact you if this is positive as well.  Gargle with warm salt water multiple times a day.  Use Tylenol for pain relief.  We are starting a steroid taper since you have having such significant pain and swelling.  Take this as prescribed and do not take NSAIDs with this medication including aspirin, ibuprofen/Advil, naproxen/Aleve.  Gargle with viscous lidocaine every 4-6 hours as needed.  Do not eat or drink immediately after using this medication as able because increased risk of choking.  If your symptoms or not improving or if anything worsens and you have difficulty swallowing, shortness of breath, swelling of your throat, fever you should be seen immediately.  The area on your back does not appear to be significantly infected.  I do recommend that you keep the area clean and apply Bactroban ointment twice daily.  If this area expands or worsens you should return for reevaluation.

## 2023-02-04 NOTE — ED Triage Notes (Signed)
Patient c/o sore throat x 2 days, very painful swallowing.  Denies any other sx's.  Patient denies any OTC pain meds.

## 2023-02-05 LAB — CULTURE, GROUP A STREP (THRC)

## 2023-02-05 LAB — SARS CORONAVIRUS 2 (TAT 6-24 HRS): SARS Coronavirus 2: NEGATIVE

## 2023-02-07 LAB — CULTURE, GROUP A STREP (THRC)

## 2023-02-28 ENCOUNTER — Ambulatory Visit: Payer: 59 | Admitting: Family Medicine

## 2023-02-28 ENCOUNTER — Encounter: Payer: Self-pay | Admitting: Family Medicine

## 2023-02-28 VITALS — BP 116/84 | HR 71 | Temp 98.5°F | Resp 17 | Ht 78.0 in | Wt 357.5 lb

## 2023-02-28 DIAGNOSIS — W57XXXA Bitten or stung by nonvenomous insect and other nonvenomous arthropods, initial encounter: Secondary | ICD-10-CM

## 2023-02-28 DIAGNOSIS — S50862A Insect bite (nonvenomous) of left forearm, initial encounter: Secondary | ICD-10-CM | POA: Diagnosis not present

## 2023-02-28 MED ORDER — CEPHALEXIN 500 MG PO CAPS: 500.0000 mg | ORAL_CAPSULE | Freq: Three times a day (TID) | ORAL | 0 refills | Status: AC

## 2023-02-28 NOTE — Patient Instructions (Signed)
Follow up as needed or as scheduled We'll notify you of your lab results and make any changes if needed START the Cephalexin 3x/day w/ meals to treat any skin infection ICE the sting site on your chest The area on your forearm appears to be healing well- no cause for concern Call with any questions or concerns Hang in there!
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
## 2023-03-01 ENCOUNTER — Telehealth: Payer: Self-pay

## 2023-03-01 LAB — LYME DISEASE SEROLOGY W/REFLEX: Lyme Total Antibody EIA: NEGATIVE

## 2023-03-01 NOTE — Telephone Encounter (Signed)
-----   Message from Sheliah Hatch, MD sent at 03/01/2023  3:04 PM EDT ----- No evidence of Lyme disease- great news!

## 2023-03-07 ENCOUNTER — Encounter: Payer: Self-pay | Admitting: Family Medicine

## 2023-03-07 ENCOUNTER — Ambulatory Visit: Payer: 59 | Admitting: Family Medicine

## 2023-03-07 VITALS — BP 120/86 | HR 66 | Temp 98.1°F | Ht 78.0 in | Wt 352.2 lb

## 2023-03-07 DIAGNOSIS — L255 Unspecified contact dermatitis due to plants, except food: Secondary | ICD-10-CM | POA: Diagnosis not present

## 2023-03-07 MED ORDER — METHYLPREDNISOLONE ACETATE 40 MG/ML IJ SUSP
120.0000 mg | Freq: Once | INTRAMUSCULAR | Status: AC
Start: 2023-03-07 — End: 2023-03-07
  Administered 2023-03-07: 120 mg via INTRAMUSCULAR

## 2023-03-07 NOTE — Addendum Note (Signed)
Addended byVickii Chafe on: 03/07/2023 02:28 PM   Modules accepted: Orders

## 2023-03-07 NOTE — Progress Notes (Signed)
Established Patient Office Visit  Subjective   Patient ID: Bradley [name], male    DOB: [birth year]  Age: 27 y.o. MRN: [id number]  Chief Complaint  Patient presents with   Poison Ivy    Pt reports he had come in contact with poison ivy. Has rash/itchy/blister on both arms. Noticed 5-6 days ago. Used Calamine lotion. Pt reports sx worsen when taking hot shower or going outside.   Cyst    Pt reports cyst on R inner thigh. Noticed it couple months ago.     HPI   Bradley [name] is seen for the following items  Probable contact dermatitis.  This started last week.  Involvement of both forearms and hands.  Significant pruritus especially after showering and with heat.  Has used some calamine lotion without much improvement.  Has had similar outbreaks in the past.  Has benefited with steroid injections in the past and requesting the same.  Recent cyst right inner thigh.  Drain some on its own.  No fever.  No fluctuance.  Improving slowly.  Past Medical History:  Diagnosis Date   Allergy to alpha-gal    Anxiety    Chronic headaches    Clavicular fracture    Diverticulosis    History of chickenpox    Mild intermittent asthma 06/07/2018   Shingles    27 years old.   Tubular adenoma of colon    Urticaria 05/18/2020   Past Surgical History:  Procedure Laterality Date   COLONOSCOPY     CYSTECTOMY  01/2019   scrotum area    ESOPHAGEAL MANOMETRY N/A 06/09/2020   Procedure: ESOPHAGEAL MANOMETRY (EM);  Surgeon: Shellia Cleverly, DO;  Location: [facility];  Service: Gastroenterology;  Laterality: N/A;   ESOPHAGOGASTRODUODENOSCOPY     shoulder surgery Right 08/26/2019   WISDOM TOOTH EXTRACTION      reports that he has never smoked. He has never used smokeless tobacco. He reports current alcohol use of about 3.0 standard drinks of alcohol per week. He reports current drug use. Frequency: 2.00 times per week. Drug: Marijuana. family history includes Colon polyps in his mother; Diabetes in his  father; Hypertension in his mother; Migraines in his sister. Allergies  Allergen Reactions   Shellfish Allergy Anaphylaxis   Hydrocodone Itching    Pt states he feels itchy after taking, but it doesn't preclude him taking hydrocodone if needed About 2-3 years ago.  It was the first time he took hydrocodone.   Other Diarrhea    Slightly allergic to red meat since tick bite.Marland KitchenMarland KitchenNot anaphylaxis, but GI sensitivity.    Review of Systems  Constitutional:  Negative for chills and fever.  Skin:  Positive for itching and rash.      Objective:     BP 120/86 (BP Location: Left Arm, Patient Position: Sitting, Cuff Size: Large)   Pulse 66   Temp 98.1 F (36.7 C) (Oral)   Ht 6\' 6"  (1.981 m)   Wt (!) 352 lb 3.2 oz (159.8 kg)   SpO2 98%   BMI 40.70 kg/m    Physical Exam Vitals reviewed.  Cardiovascular:     Rate and Rhythm: Normal rate and regular rhythm.  Skin:    Comments: For areas of scattered rash on his forearms and hands bilaterally.  Erythematous base and slightly vesicular surface some in linear distribution.  Right groin reveals area of induration about 1 cm diameter with no fluctuance.  Mildly excoriated on the surface.  Neurological:     Mental Status: He  is alert.      No results found for any visits on 03/07/23.    The ASCVD Risk score (Arnett DK, et al., 2019) failed to calculate for the following reasons:   The 2019 ASCVD risk score is only valid for ages 48 to 38    Assessment & Plan:   #1 contact dermatitis.  Patient has tried topicals without much improvement.  Went over options of oral steroid versus injectable and he prefers the latter.  He has responded well to this in the past.  Depo-Medrol 120 mg IM given Follow-up for any persistent or worsening rash  #2 recent cyst versus small abscess right inner thigh.  Appears to be resolving.  No cellulitis changes.  No fluctuance.  Follow-up as needed  No follow-ups on file.    Evelena Peat, MD

## 2023-03-22 ENCOUNTER — Other Ambulatory Visit: Payer: Self-pay | Admitting: Allergy

## 2023-03-22 ENCOUNTER — Encounter: Payer: Self-pay | Admitting: Allergy

## 2023-03-22 ENCOUNTER — Ambulatory Visit (INDEPENDENT_AMBULATORY_CARE_PROVIDER_SITE_OTHER): Payer: 59 | Admitting: Allergy

## 2023-03-22 VITALS — BP 130/70

## 2023-03-22 DIAGNOSIS — J4540 Moderate persistent asthma, uncomplicated: Secondary | ICD-10-CM

## 2023-03-22 DIAGNOSIS — J302 Other seasonal allergic rhinitis: Secondary | ICD-10-CM

## 2023-03-22 DIAGNOSIS — Z91018 Allergy to other foods: Secondary | ICD-10-CM

## 2023-03-22 DIAGNOSIS — T7800XD Anaphylactic reaction due to unspecified food, subsequent encounter: Secondary | ICD-10-CM

## 2023-03-22 DIAGNOSIS — K219 Gastro-esophageal reflux disease without esophagitis: Secondary | ICD-10-CM

## 2023-03-22 DIAGNOSIS — H1013 Acute atopic conjunctivitis, bilateral: Secondary | ICD-10-CM

## 2023-03-22 DIAGNOSIS — W57XXXD Bitten or stung by nonvenomous insect and other nonvenomous arthropods, subsequent encounter: Secondary | ICD-10-CM

## 2023-03-22 DIAGNOSIS — W57XXXA Bitten or stung by nonvenomous insect and other nonvenomous arthropods, initial encounter: Secondary | ICD-10-CM | POA: Insufficient documentation

## 2023-03-22 MED ORDER — FLUTICASONE FUROATE-VILANTEROL 200-25 MCG/ACT IN AEPB: 1.0000 | INHALATION_SPRAY | Freq: Every day | RESPIRATORY_TRACT | 3 refills | Status: DC

## 2023-03-22 NOTE — Assessment & Plan Note (Signed)
Continue dietary and lifestyle modifications Take omeprazole 20mg in the morning. Nothing to eat or drink for 30 min afterwards.  

## 2023-03-22 NOTE — Patient Instructions (Addendum)
Asthma Daily controller medication(s): increase Breo to 1 puff once a day and rinse mouth after each use.  May use albuterol rescue inhaler 2 puffs or nebulizer every 4 to 6 hours as needed for shortness of breath, chest tightness, coughing, and wheezing. May use albuterol rescue inhaler 2 puffs 5 to 15 minutes prior to strenuous physical activities. Monitor frequency of use - if you need to use it more than twice per week on a consistent basis let us know.  Breathing control goals:  Full participation in all desired activities (may need albuterol before activity) Albuterol use two times or less a week on average (not counting use with activity) Cough interfering with sleep two times or less a month Oral steroids no more than once a year No hospitalizations   Food allergy Avoid red meat and shellfish. Will recheck alpha gal at the end of the year.  For mild symptoms you can take over the counter antihistamines such as Benadryl and monitor symptoms closely. If symptoms worsen or if you have severe symptoms including breathing issues, throat closure, significant swelling, whole body hives, severe diarrhea and vomiting, lightheadedness then inject epinephrine and seek immediate medical care afterwards.  If interested we can schedule food challenge to shellfish - shrimp. You must be off antihistamines for 3-5 days before. Must be in good health and not ill. No vaccines/injections/antibiotics within the past 7 days. Plan on being in the office for 2-3 hours and must bring in the food you want to do the oral challenge for - 1 serving of cooked shrimp. You must call to schedule an appointment and specify it's for a food challenge.   Environmental allergies Use over the counter antihistamines such as Zyrtec (cetirizine), Claritin (loratadine), Allegra (fexofenadine), or Xyzal (levocetirizine) daily as needed. May take twice a day during allergy flares. May switch antihistamines every few  months. Use Flonase (fluticasone) nasal spray 1 spray per nostril twice a day as needed for nasal congestion.   Heartburn: Continue lifestyle and dietary modifications. Take omeprazole 20mg  in the morning. Nothing to eat or drink for 30 min afterwards.   Follow up in 3 months or sooner if needed.

## 2023-03-22 NOTE — Assessment & Plan Note (Signed)
Interim history - doing better with Breo but still using albuterol 3 times per week.  Today's spirometry was normal. Daily controller medication(s): increase Breo to 1 puff once a day and rinse mouth after each use.  May use albuterol rescue inhaler 2 puffs or nebulizer every 4 to 6 hours as needed for shortness of breath, chest tightness, coughing, and wheezing. May use albuterol rescue inhaler 2 puffs 5 to 15 minutes prior to strenuous physical activities. Monitor frequency of use - if you need to use it more than twice per week on a consistent basis let us know.  Get spirometry at next visit.

## 2023-03-22 NOTE — Progress Notes (Signed)
Follow Up Note  RE: Bradley [name] MRN: [id number] DOB: April 27, 1996 Date of Office Visit: 03/22/2023  Referring provider: [name], MD Primary care provider: [name], MD  Chief Complaint: Asthma (Doing good)  History of Present Illness: I had the pleasure of seeing Bradley [name] for a follow up visit at the [facility] on 03/22/2023. He is a 27 y.o. male, who is being followed for asthma, allergic rhinoconjunctivitis, food allergy and GERD. His previous allergy office visit was on 01/11/2023 with Dr. Selena Batten. Today is a regular follow up visit.  Asthma  Currently on Breo 1 puff once a day. Uses albuterol about 3 times per week due to shortness of breath and wheezing with good benefit.   Rash Having some rash on the legs and arms. Describes it as itchy, raised.  This started 2 days ago and he was outdoors.    Seasonal and perennial allergic rhinoconjunctivitis Takes xzyal with good benefit.   Food allergy Currently avoiding red meat and shellfish.  Gastroesophageal reflux disease Taking reflux meds with good benefit.   Assessment and Plan: Bradley [name] is a 27 y.o. male with: Not well controlled moderate persistent asthma Interim history - doing better with Breo but still using albuterol 3 times per week.  Today's spirometry was normal. Daily controller medication(s): increase Breo to 1 puff once a day and rinse mouth after each use.  May use albuterol rescue inhaler 2 puffs or nebulizer every 4 to 6 hours as needed for shortness of breath, chest tightness, coughing, and wheezing. May use albuterol rescue inhaler 2 puffs 5 to 15 minutes prior to strenuous physical activities. Monitor frequency of use - if you need to use it more than twice per week on a consistent basis let us know.  Get spirometry at next visit.  Seasonal and perennial allergic rhinoconjunctivitis Past history - 2019 blood work was positive to dust mites, cat, dog,  grass, tree, borderline to ragweed, cockroach. Started AIT on 02/14/2018 (Mite-Cockroach-Ragweed & Pollen-Pet). Stopped AIT in 2021. Can't take Singulair.  Interim history - controlled.  Use over the counter antihistamines such as Zyrtec (cetirizine), Claritin (loratadine), Allegra (fexofenadine), or Xyzal (levocetirizine) daily as needed. May take twice a day during allergy flares. May switch antihistamines every few months. Use Flonase (fluticasone) nasal spray 1 spray per nostril twice a day as needed for nasal congestion.   Allergy to alpha-gal Continue to avoid red meat.  Avoid future tick bites. Recheck at the end of the year.   Food allergy Past history - 2020 bloodwork positive to shellfish. 2024 bloodwork alpha gal still positive, shrimp borderline. No issues with oysters, lobsters, crab and dairy products.  Avoid red meat and shellfish. For mild symptoms you can take over the counter antihistamines such as Benadryl and monitor symptoms closely. If symptoms worsen or if you have severe symptoms including breathing issues, throat closure, significant swelling, whole body hives, severe diarrhea and vomiting, lightheadedness then inject epinephrine and seek immediate medical care afterwards. If interested we can schedule food challenge to shellfish - shrimp. You must be off antihistamines for 3-5 days before. Must be in good health and not ill. No vaccines/injections/antibiotics within the past 7 days. Plan on being in the office for 2-3 hours and must bring in the food you want to do the oral challenge for - 1 serving of cooked shrimp. You must call to schedule an appointment and specify it's for a food challenge.  Gastroesophageal reflux disease Continue dietary and lifestyle modifications Take omeprazole 20mg  in the morning. Nothing to eat or drink for 30 min afterwards.   Insect bites and stings Rash on legs look like from insect/bug bites. Gave handout.  Return in about 3 months  (around 06/22/2023).  Meds ordered this encounter  Medications   fluticasone furoate-vilanterol (BREO ELLIPTA) 200-25 MCG/ACT AEPB    Sig: Inhale 1 puff into the lungs daily. Rinse your mouth after each use.    Dispense:  60 each    Refill:  3   Lab Orders  No laboratory test(s) ordered today    Diagnostics: Spirometry:  Tracings reviewed. His effort: Good reproducible efforts. FVC: 6.82L FEV1: 5.59L, 100% predicted FEV1/FVC ratio: 82% Interpretation: Spirometry consistent with normal pattern.  Please see scanned spirometry results for details.  Medication List:  Current Outpatient Medications  Medication Sig Dispense Refill   albuterol (PROVENTIL) (2.5 MG/3ML) 0.083% nebulizer solution Take 3 mLs (2.5 mg total) by nebulization every 4 (four) hours as needed for wheezing or shortness of breath (coughing fits). 75 mL 1   albuterol (VENTOLIN HFA) 108 (90 Base) MCG/ACT inhaler INHALE 2 PUFFS INTO THE LUNGS EVERY 4 HOURS AS NEEDED WHEEZING OR SHORTNESS OF BREATH(COUGHING FITS) 18 g 1   AUVI-Q 0.3 MG/0.3ML SOAJ injection Inject 0.3 mg into the muscle as needed for anaphylaxis. 1 each 1   azelastine (ASTELIN) 0.1 % nasal spray Place 1 spray into both nostrils 2 (two) times daily.     citalopram (CELEXA) 20 MG tablet TAKE 1 TABLET(20 MG) BY MOUTH DAILY 90 tablet 0   clindamycin (CLEOCIN T) 1 % external solution Apply 1 Application topically 2 (two) times daily.     clonazePAM (KLONOPIN) 0.5 MG tablet TAKE 1 TABLET(0.5 MG) BY MOUTH TWICE DAILY AS NEEDED FOR ANXIETY 60 tablet 1   cycloSPORINE (RESTASIS OP) Apply to eye.     doxycycline (VIBRAMYCIN) 100 MG capsule Take 100 mg by mouth 2 (two) times daily.     fluticasone furoate-vilanterol (BREO ELLIPTA) 200-25 MCG/ACT AEPB Inhale 1 puff into the lungs daily. Rinse your mouth after each use. 60 each 3   levocetirizine (XYZAL) 5 MG tablet Take 1 tablet (5 mg total) by mouth every evening. 30 tablet 5   sildenafil (VIAGRA) 100 MG tablet TAKE  1/2 TO 1 TABLET(50 TO 100 MG) BY MOUTH DAILY AS NEEDED FOR ERECTILE DYSFUNCTION 10 tablet 3   triamcinolone cream (KENALOG) 0.1 % Apply 1 Application topically 2 (two) times daily.     No current facility-administered medications for this visit.   Allergies: Allergies  Allergen Reactions   Shellfish Allergy Anaphylaxis   Hydrocodone Itching    Pt states he feels itchy after taking, but it doesn't preclude him taking hydrocodone if needed About 2-3 years ago.  It was the first time he took hydrocodone.   Other Diarrhea    Slightly allergic to red meat since tick bite.Marland KitchenMarland KitchenNot anaphylaxis, but GI sensitivity.   I reviewed his past medical history, social history, family history, and environmental history and no significant changes have been reported from his previous visit.  Review of Systems  Constitutional:  Negative for appetite change, chills, fever and unexpected weight change.  HENT:  Negative for congestion and rhinorrhea.   Eyes:  Negative for itching.  Respiratory:  Positive for shortness of breath and wheezing. Negative for cough and chest tightness.   Gastrointestinal:  Negative for abdominal pain.  Skin:  Positive for rash.  Allergic/Immunologic: Positive for environmental  allergies and food allergies.  Neurological:  Negative for headaches.    Objective: BP 130/70 (BP Location: Right Arm, Patient Position: Sitting, Cuff Size: Large)  There is no height or weight on file to calculate BMI. Physical Exam Vitals and nursing note reviewed.  Constitutional:      Appearance: Normal appearance. He is well-developed. He is obese.  HENT:     Head: Normocephalic and atraumatic.     Right Ear: Tympanic membrane and external ear normal.     Left Ear: Tympanic membrane and external ear normal.     Nose: Nose normal.     Mouth/Throat:     Mouth: Mucous membranes are moist.     Pharynx: Oropharynx is clear.  Eyes:     Conjunctiva/sclera: Conjunctivae normal.  Cardiovascular:      Rate and Rhythm: Normal rate and regular rhythm.     Heart sounds: Normal heart sounds. No murmur heard. Pulmonary:     Effort: Pulmonary effort is normal.     Breath sounds: Normal breath sounds. No wheezing, rhonchi or rales.  Musculoskeletal:     Cervical back: Neck supple.  Skin:    General: Skin is warm.     Findings: Rash present.     Comments: Few scattered rash on lower extremities - look like insect/bug bites.  Neurological:     Mental Status: He is alert and oriented to person, place, and time.  Psychiatric:        Behavior: Behavior normal.   Previous notes and tests were reviewed. The plan was reviewed with the patient/family, and all questions/concerned were addressed.  It was my pleasure to see Bradley [name] today and participate in his care. Please feel free to contact me with any questions or concerns.  Sincerely,  Wyline Mood, DO Allergy & Immunology  [facility] office: [phone] [facility] office: [phone]

## 2023-03-22 NOTE — Assessment & Plan Note (Signed)
Continue to avoid red meat.  Avoid future tick bites. Recheck at the end of the year.

## 2023-03-22 NOTE — Assessment & Plan Note (Signed)
Past history - 2019 blood work was positive to dust mites, cat, dog, grass, tree, borderline to ragweed, cockroach. Started AIT on 02/14/2018 (Mite-Cockroach-Ragweed & Pollen-Pet). Stopped AIT in 2021. Can't take Singulair.  Interim history - controlled.  Use over the counter antihistamines such as Zyrtec (cetirizine), Claritin (loratadine), Allegra (fexofenadine), or Xyzal (levocetirizine) daily as needed. May take twice a day during allergy flares. May switch antihistamines every few months. Use Flonase (fluticasone) nasal spray 1 spray per nostril twice a day as needed for nasal congestion.

## 2023-03-22 NOTE — Assessment & Plan Note (Signed)
Rash on legs look like from insect/bug bites. Gave handout.

## 2023-03-22 NOTE — Assessment & Plan Note (Signed)
Past history - 2020 bloodwork positive to shellfish. 2024 bloodwork alpha gal still positive, shrimp borderline. No issues with oysters, lobsters, crab and dairy products.  Avoid red meat and shellfish. For mild symptoms you can take over the counter antihistamines such as Benadryl and monitor symptoms closely. If symptoms worsen or if you have severe symptoms including breathing issues, throat closure, significant swelling, whole body hives, severe diarrhea and vomiting, lightheadedness then inject epinephrine and seek immediate medical care afterwards. If interested we can schedule food challenge to shellfish - shrimp. You must be off antihistamines for 3-5 days before. Must be in good health and not ill. No vaccines/injections/antibiotics within the past 7 days. Plan on being in the office for 2-3 hours and must bring in the food you want to do the oral challenge for - 1 serving of cooked shrimp. You must call to schedule an appointment and specify it's for a food challenge.

## 2023-05-18 ENCOUNTER — Encounter: Payer: Self-pay | Admitting: Family Medicine

## 2023-05-18 ENCOUNTER — Other Ambulatory Visit: Payer: Self-pay | Admitting: Family Medicine

## 2023-05-18 ENCOUNTER — Other Ambulatory Visit: Payer: Self-pay | Admitting: Allergy

## 2023-05-18 NOTE — Telephone Encounter (Signed)
Pt LOV wason 03/07/23 Last refill done on 01/19/23 Please advise

## 2023-06-18 NOTE — Progress Notes (Deleted)
Follow Up Note  RE: Bradley [name] MRN: [id number] DOB: [birth year] Date of Office Visit: 06/19/2023  Referring provider: [name], MD Primary care provider: [name], MD  Chief Complaint: No chief complaint on file.  History of Present Illness: I had the pleasure of seeing Bradley [name] for a follow up visit at the [facility] on 06/18/2023. He is a 27 y.o. male, who is being followed for asthma, allergic rhinoconjunctivitis, food allergy and GERD . His previous allergy office visit was on 03/22/2023 with Dr. Selena Batten. Today is a regular follow up visit.  Discussed the use of AI scribe software for clinical note transcription with the patient, who gave verbal consent to proceed.  History of Present Illness            Not well controlled moderate persistent asthma Interim history - doing better with Breo but still using albuterol 3 times per week.  Today's spirometry was normal. Daily controller medication(s): increase Breo to 1 puff once a day and rinse mouth after each use.  May use albuterol rescue inhaler 2 puffs or nebulizer every 4 to 6 hours as needed for shortness of breath, chest tightness, coughing, and wheezing. May use albuterol rescue inhaler 2 puffs 5 to 15 minutes prior to strenuous physical activities. Monitor frequency of use - if you need to use it more than twice per week on a consistent basis let us know.  Get spirometry at next visit.   Seasonal and perennial allergic rhinoconjunctivitis Past history - 2019 blood work was positive to dust mites, cat, dog, grass, tree, borderline to ragweed, cockroach. Started AIT on 02/14/2018 (Mite-Cockroach-Ragweed & Pollen-Pet). Stopped AIT in 2021. Can't take Singulair.  Interim history - controlled.  Use over the counter antihistamines such as Zyrtec (cetirizine), Claritin (loratadine), Allegra (fexofenadine), or Xyzal (levocetirizine) daily as needed. May take twice a day during allergy  flares. May switch antihistamines every few months. Use Flonase (fluticasone) nasal spray 1 spray per nostril twice a day as needed for nasal congestion.    Allergy to alpha-gal Continue to avoid red meat.  Avoid future tick bites. Recheck at the end of the year.    Food allergy Past history - 2020 bloodwork positive to shellfish. 2024 bloodwork alpha gal still positive, shrimp borderline. No issues with oysters, lobsters, crab and dairy products.  Avoid red meat and shellfish. For mild symptoms you can take over the counter antihistamines such as Benadryl and monitor symptoms closely. If symptoms worsen or if you have severe symptoms including breathing issues, throat closure, significant swelling, whole body hives, severe diarrhea and vomiting, lightheadedness then inject epinephrine and seek immediate medical care afterwards. If interested we can schedule food challenge to shellfish - shrimp. You must be off antihistamines for 3-5 days before. Must be in good health and not ill. No vaccines/injections/antibiotics within the past 7 days. Plan on being in the office for 2-3 hours and must bring in the food you want to do the oral challenge for - 1 serving of cooked shrimp. You must call to schedule an appointment and specify it's for a food challenge.    Gastroesophageal reflux disease Continue dietary and lifestyle modifications Take omeprazole 20mg  in the morning. Nothing to eat or drink for 30 min afterwards.     Assessment and Plan: [name] is a 27 y.o. male with: *** Assessment and Plan              No follow-ups  on file.  No orders of the defined types were placed in this encounter.  Lab Orders  No laboratory test(s) ordered today    Diagnostics: Spirometry:  Tracings reviewed. His effort: {Blank single:19197::"Good reproducible efforts.","It was hard to get consistent efforts and there is a question as to whether this reflects a maximal maneuver.","Poor effort, data can  not be interpreted."} FVC: ***L FEV1: ***L, ***% predicted FEV1/FVC ratio: ***% Interpretation: {Blank single:19197::"Spirometry consistent with mild obstructive disease","Spirometry consistent with moderate obstructive disease","Spirometry consistent with severe obstructive disease","Spirometry consistent with possible restrictive disease","Spirometry consistent with mixed obstructive and restrictive disease","Spirometry uninterpretable due to technique","Spirometry consistent with normal pattern","No overt abnormalities noted given today's efforts"}.  Please see scanned spirometry results for details.  Skin Testing: {Blank single:19197::"Select foods","Environmental allergy panel","Environmental allergy panel and select foods","Food allergy panel","None","Deferred due to recent antihistamines use"}. *** Results discussed with patient/family.   Medication List:  Current Outpatient Medications  Medication Sig Dispense Refill   albuterol (PROVENTIL) (2.5 MG/3ML) 0.083% nebulizer solution Take 3 mLs (2.5 mg total) by nebulization every 4 (four) hours as needed for wheezing or shortness of breath (coughing fits). 75 mL 1   albuterol (VENTOLIN HFA) 108 (90 Base) MCG/ACT inhaler INHALE 2 PUFFS INTO THE LUNGS EVERY 4 HOURS AS NEEDED FOR SHORTNESS OF BREATH OR WHEEZING 8.5 g 1   AUVI-Q 0.3 MG/0.3ML SOAJ injection Inject 0.3 mg into the muscle as needed for anaphylaxis. 1 each 1   azelastine (ASTELIN) 0.1 % nasal spray Place 1 spray into both nostrils 2 (two) times daily.     citalopram (CELEXA) 20 MG tablet TAKE 1 TABLET(20 MG) BY MOUTH DAILY 90 tablet 0   clindamycin (CLEOCIN T) 1 % external solution Apply 1 Application topically 2 (two) times daily.     clonazePAM (KLONOPIN) 0.5 MG tablet TAKE 1 TABLET(0.5 MG) BY MOUTH TWICE DAILY AS NEEDED FOR ANXIETY 60 tablet 0   cycloSPORINE (RESTASIS OP) Apply to eye.     doxycycline (VIBRAMYCIN) 100 MG capsule Take 100 mg by mouth 2 (two) times daily.      fluticasone furoate-vilanterol (BREO ELLIPTA) 200-25 MCG/ACT AEPB Inhale 1 puff into the lungs daily. Rinse your mouth after each use. 60 each 3   levocetirizine (XYZAL) 5 MG tablet Take 1 tablet (5 mg total) by mouth every evening. 30 tablet 5   sildenafil (VIAGRA) 100 MG tablet TAKE 1/2 TO 1 TABLET(50 TO 100 MG) BY MOUTH DAILY AS NEEDED FOR ERECTILE DYSFUNCTION 10 tablet 3   triamcinolone cream (KENALOG) 0.1 % Apply 1 Application topically 2 (two) times daily.     No current facility-administered medications for this visit.   Allergies: Allergies  Allergen Reactions   Shellfish Allergy Anaphylaxis   Hydrocodone Itching    Pt states he feels itchy after taking, but it doesn't preclude him taking hydrocodone if needed About 2-3 years ago.  It was the first time he took hydrocodone.   Other Diarrhea    Slightly allergic to red meat since tick bite.Marland KitchenMarland KitchenNot anaphylaxis, but GI sensitivity.   I reviewed his past medical history, social history, family history, and environmental history and no significant changes have been reported from his previous visit.  Review of Systems  Constitutional:  Negative for appetite change, chills, fever and unexpected weight change.  HENT:  Negative for congestion and rhinorrhea.   Eyes:  Negative for itching.  Respiratory:  Positive for shortness of breath and wheezing. Negative for cough and chest tightness.   Gastrointestinal:  Negative for abdominal pain.  Skin:  Positive for rash.  Allergic/Immunologic: Positive for environmental allergies and food allergies.  Neurological:  Negative for headaches.    Objective: There were no vitals taken for this visit. There is no height or weight on file to calculate BMI. Physical Exam Vitals and nursing note reviewed.  Constitutional:      Appearance: Normal appearance. He is well-developed. He is obese.  HENT:     Head: Normocephalic and atraumatic.     Right Ear: Tympanic membrane and external ear normal.      Left Ear: Tympanic membrane and external ear normal.     Nose: Nose normal.     Mouth/Throat:     Mouth: Mucous membranes are moist.     Pharynx: Oropharynx is clear.  Eyes:     Conjunctiva/sclera: Conjunctivae normal.  Cardiovascular:     Rate and Rhythm: Normal rate and regular rhythm.     Heart sounds: Normal heart sounds. No murmur heard. Pulmonary:     Effort: Pulmonary effort is normal.     Breath sounds: Normal breath sounds. No wheezing, rhonchi or rales.  Musculoskeletal:     Cervical back: Neck supple.  Skin:    General: Skin is warm.     Findings: Rash present.     Comments: Few scattered rash on lower extremities - look like insect/bug bites.  Neurological:     Mental Status: He is alert and oriented to person, place, and time.  Psychiatric:        Behavior: Behavior normal.    Previous notes and tests were reviewed. The plan was reviewed with the patient/family, and all questions/concerned were addressed.  It was my pleasure to see Hartman today and participate in his care. Please feel free to contact me with any questions or concerns.  Sincerely,  Wyline Mood, DO Allergy & Immunology  [facility] office: [phone] [facility] office: [phone]

## 2023-06-19 ENCOUNTER — Ambulatory Visit: Payer: 59 | Admitting: Allergy

## 2023-06-19 DIAGNOSIS — T7800XD Anaphylactic reaction due to unspecified food, subsequent encounter: Secondary | ICD-10-CM

## 2023-06-19 DIAGNOSIS — J3089 Other allergic rhinitis: Secondary | ICD-10-CM

## 2023-06-19 DIAGNOSIS — Z91038 Other insect allergy status: Secondary | ICD-10-CM

## 2023-06-19 DIAGNOSIS — H1013 Acute atopic conjunctivitis, bilateral: Secondary | ICD-10-CM

## 2023-06-19 DIAGNOSIS — J301 Allergic rhinitis due to pollen: Secondary | ICD-10-CM

## 2023-06-19 DIAGNOSIS — J3081 Allergic rhinitis due to animal (cat) (dog) hair and dander: Secondary | ICD-10-CM

## 2023-06-19 DIAGNOSIS — Z91018 Allergy to other foods: Secondary | ICD-10-CM

## 2023-06-25 NOTE — Progress Notes (Unsigned)
Follow Up Note  RE: [name] MRN: [id number] DOB: [birth year] Date of Office Visit: 06/26/2023  Referring provider: [name], MD Primary care provider: [name], MD  Chief Complaint: No chief complaint on file.  History of Present Illness: I had the pleasure of seeing [name] for a follow up visit at the [facility] on 06/25/2023. He is a 27 y.o. male, who is being followed for asthma, allergic rhinoconjunctivitis, food allergy and GERD . His previous allergy office visit was on 03/22/2023 with Dr. Selena Batten. Today is a regular follow up visit.  Discussed the use of AI scribe software for clinical note transcription with the patient, who gave verbal consent to proceed.  History of Present Illness            Not well controlled moderate persistent asthma Interim history - doing better with Breo but still using albuterol 3 times per week.  Today's spirometry was normal. Daily controller medication(s): increase Breo to 1 puff once a day and rinse mouth after each use.  May use albuterol rescue inhaler 2 puffs or nebulizer every 4 to 6 hours as needed for shortness of breath, chest tightness, coughing, and wheezing. May use albuterol rescue inhaler 2 puffs 5 to 15 minutes prior to strenuous physical activities. Monitor frequency of use - if you need to use it more than twice per week on a consistent basis let us know.  Get spirometry at next visit.   Seasonal and perennial allergic rhinoconjunctivitis Past history - 2019 blood work was positive to dust mites, cat, dog, grass, tree, borderline to ragweed, cockroach. Started AIT on 02/14/2018 (Mite-Cockroach-Ragweed & Pollen-Pet). Stopped AIT in 2021. Can't take Singulair.  Interim history - controlled.  Use over the counter antihistamines such as Zyrtec (cetirizine), Claritin (loratadine), Allegra (fexofenadine), or Xyzal (levocetirizine) daily as needed. May take twice a day during allergy  flares. May switch antihistamines every few months. Use Flonase (fluticasone) nasal spray 1 spray per nostril twice a day as needed for nasal congestion.    Allergy to alpha-gal Continue to avoid red meat.  Avoid future tick bites. Recheck at the end of the year.    Food allergy Past history - 2020 bloodwork positive to shellfish. 2024 bloodwork alpha gal still positive, shrimp borderline. No issues with oysters, lobsters, crab and dairy products.  Avoid red meat and shellfish. For mild symptoms you can take over the counter antihistamines such as Benadryl and monitor symptoms closely. If symptoms worsen or if you have severe symptoms including breathing issues, throat closure, significant swelling, whole body hives, severe diarrhea and vomiting, lightheadedness then inject epinephrine and seek immediate medical care afterwards. If interested we can schedule food challenge to shellfish - shrimp. You must be off antihistamines for 3-5 days before. Must be in good health and not ill. No vaccines/injections/antibiotics within the past 7 days. Plan on being in the office for 2-3 hours and must bring in the food you want to do the oral challenge for - 1 serving of cooked shrimp. You must call to schedule an appointment and specify it's for a food challenge.    Gastroesophageal reflux disease Continue dietary and lifestyle modifications Take omeprazole 20mg  in the morning. Nothing to eat or drink for 30 min afterwards.     Assessment and Plan: Bradley [name] is a 27 y.o. male with: *** Assessment and Plan              No follow-ups  on file.  No orders of the defined types were placed in this encounter.  Lab Orders  No laboratory test(s) ordered today    Diagnostics: Spirometry:  Tracings reviewed. His effort: {Blank single:19197::"Good reproducible efforts.","It was hard to get consistent efforts and there is a question as to whether this reflects a maximal maneuver.","Poor effort, data can  not be interpreted."} FVC: ***L FEV1: ***L, ***% predicted FEV1/FVC ratio: ***% Interpretation: {Blank single:19197::"Spirometry consistent with mild obstructive disease","Spirometry consistent with moderate obstructive disease","Spirometry consistent with severe obstructive disease","Spirometry consistent with possible restrictive disease","Spirometry consistent with mixed obstructive and restrictive disease","Spirometry uninterpretable due to technique","Spirometry consistent with normal pattern","No overt abnormalities noted given today's efforts"}.  Please see scanned spirometry results for details.  Skin Testing: {Blank single:19197::"Select foods","Environmental allergy panel","Environmental allergy panel and select foods","Food allergy panel","None","Deferred due to recent antihistamines use"}. *** Results discussed with patient/family.   Medication List:  Current Outpatient Medications  Medication Sig Dispense Refill   albuterol (PROVENTIL) (2.5 MG/3ML) 0.083% nebulizer solution Take 3 mLs (2.5 mg total) by nebulization every 4 (four) hours as needed for wheezing or shortness of breath (coughing fits). 75 mL 1   albuterol (VENTOLIN HFA) 108 (90 Base) MCG/ACT inhaler INHALE 2 PUFFS INTO THE LUNGS EVERY 4 HOURS AS NEEDED FOR SHORTNESS OF BREATH OR WHEEZING 8.5 g 1   AUVI-Q 0.3 MG/0.3ML SOAJ injection Inject 0.3 mg into the muscle as needed for anaphylaxis. 1 each 1   azelastine (ASTELIN) 0.1 % nasal spray Place 1 spray into both nostrils 2 (two) times daily.     citalopram (CELEXA) 20 MG tablet TAKE 1 TABLET(20 MG) BY MOUTH DAILY 90 tablet 0   clindamycin (CLEOCIN T) 1 % external solution Apply 1 Application topically 2 (two) times daily.     clonazePAM (KLONOPIN) 0.5 MG tablet TAKE 1 TABLET(0.5 MG) BY MOUTH TWICE DAILY AS NEEDED FOR ANXIETY 60 tablet 0   cycloSPORINE (RESTASIS OP) Apply to eye.     doxycycline (VIBRAMYCIN) 100 MG capsule Take 100 mg by mouth 2 (two) times daily.      fluticasone furoate-vilanterol (BREO ELLIPTA) 200-25 MCG/ACT AEPB Inhale 1 puff into the lungs daily. Rinse your mouth after each use. 60 each 3   levocetirizine (XYZAL) 5 MG tablet Take 1 tablet (5 mg total) by mouth every evening. 30 tablet 5   sildenafil (VIAGRA) 100 MG tablet TAKE 1/2 TO 1 TABLET(50 TO 100 MG) BY MOUTH DAILY AS NEEDED FOR ERECTILE DYSFUNCTION 10 tablet 3   triamcinolone cream (KENALOG) 0.1 % Apply 1 Application topically 2 (two) times daily.     No current facility-administered medications for this visit.   Allergies: Allergies  Allergen Reactions   Shellfish Allergy Anaphylaxis   Hydrocodone Itching    Pt states he feels itchy after taking, but it doesn't preclude him taking hydrocodone if needed About 2-3 years ago.  It was the first time he took hydrocodone.   Other Diarrhea    Slightly allergic to red meat since tick bite.Marland KitchenMarland KitchenNot anaphylaxis, but GI sensitivity.   I reviewed his past medical history, social history, family history, and environmental history and no significant changes have been reported from his previous visit.  Review of Systems  Constitutional:  Negative for appetite change, chills, fever and unexpected weight change.  HENT:  Negative for congestion and rhinorrhea.   Eyes:  Negative for itching.  Respiratory:  Positive for shortness of breath and wheezing. Negative for cough and chest tightness.   Gastrointestinal:  Negative for abdominal pain.  Skin:  Positive for rash.  Allergic/Immunologic: Positive for environmental allergies and food allergies.  Neurological:  Negative for headaches.    Objective: There were no vitals taken for this visit. There is no height or weight on file to calculate BMI. Physical Exam Vitals and nursing note reviewed.  Constitutional:      Appearance: Normal appearance. He is well-developed. He is obese.  HENT:     Head: Normocephalic and atraumatic.     Right Ear: Tympanic membrane and external ear normal.      Left Ear: Tympanic membrane and external ear normal.     Nose: Nose normal.     Mouth/Throat:     Mouth: Mucous membranes are moist.     Pharynx: Oropharynx is clear.  Eyes:     Conjunctiva/sclera: Conjunctivae normal.  Cardiovascular:     Rate and Rhythm: Normal rate and regular rhythm.     Heart sounds: Normal heart sounds. No murmur heard. Pulmonary:     Effort: Pulmonary effort is normal.     Breath sounds: Normal breath sounds. No wheezing, rhonchi or rales.  Musculoskeletal:     Cervical back: Neck supple.  Skin:    General: Skin is warm.     Findings: Rash present.     Comments: Few scattered rash on lower extremities - look like insect/bug bites.  Neurological:     Mental Status: He is alert and oriented to person, place, and time.  Psychiatric:        Behavior: Behavior normal.    Previous notes and tests were reviewed. The plan was reviewed with the patient/family, and all questions/concerned were addressed.  It was my pleasure to see Bradley [name] today and participate in his care. Please feel free to contact me with any questions or concerns.  Sincerely,  Wyline Mood, DO Allergy & Immunology  [facility] office: [phone] [facility] office: [phone]

## 2023-06-26 ENCOUNTER — Ambulatory Visit (INDEPENDENT_AMBULATORY_CARE_PROVIDER_SITE_OTHER): Payer: 59 | Admitting: Allergy

## 2023-06-26 ENCOUNTER — Other Ambulatory Visit: Payer: Self-pay

## 2023-06-26 ENCOUNTER — Encounter: Payer: Self-pay | Admitting: Allergy

## 2023-06-26 VITALS — BP 118/72 | HR 78 | Temp 98.0°F | Resp 18 | Wt 356.8 lb

## 2023-06-26 DIAGNOSIS — T781XXD Other adverse food reactions, not elsewhere classified, subsequent encounter: Secondary | ICD-10-CM

## 2023-06-26 DIAGNOSIS — J301 Allergic rhinitis due to pollen: Secondary | ICD-10-CM | POA: Diagnosis not present

## 2023-06-26 DIAGNOSIS — J3081 Allergic rhinitis due to animal (cat) (dog) hair and dander: Secondary | ICD-10-CM

## 2023-06-26 DIAGNOSIS — K219 Gastro-esophageal reflux disease without esophagitis: Secondary | ICD-10-CM

## 2023-06-26 DIAGNOSIS — Z91038 Other insect allergy status: Secondary | ICD-10-CM

## 2023-06-26 DIAGNOSIS — J3089 Other allergic rhinitis: Secondary | ICD-10-CM

## 2023-06-26 DIAGNOSIS — J4540 Moderate persistent asthma, uncomplicated: Secondary | ICD-10-CM | POA: Diagnosis not present

## 2023-06-26 DIAGNOSIS — Z91018 Allergy to other foods: Secondary | ICD-10-CM

## 2023-06-26 MED ORDER — EPINEPHRINE 0.3 MG/0.3ML IJ SOAJ: 0.3000 mg | INTRAMUSCULAR | 1 refills | Status: AC | PRN

## 2023-06-26 NOTE — Patient Instructions (Addendum)
Asthma Daily controller medication(s): restart Breo 1 puff once a day and rinse mouth after each use.  Use it daily for 1 month and let me know how you feel.  May use albuterol rescue inhaler 2 puffs or nebulizer every 4 to 6 hours as needed for shortness of breath, chest tightness, coughing, and wheezing. May use albuterol rescue inhaler 2 puffs 5 to 15 minutes prior to strenuous physical activities. Monitor frequency of use - if you need to use it more than twice per week on a consistent basis let us know.  Breathing control goals:  Full participation in all desired activities (may need albuterol before activity) Albuterol use two times or less a week on average (not counting use with activity) Cough interfering with sleep two times or less a month Oral steroids no more than once a year No hospitalizations   Food allergy Avoid red meat and shellfish. Will recheck alpha gal next year.  For mild symptoms you can take over the counter antihistamines such as Benadryl and monitor symptoms closely. If symptoms worsen or if you have severe symptoms including breathing issues, throat closure, significant swelling, whole body hives, severe diarrhea and vomiting, lightheadedness then inject epinephrine and seek immediate medical care afterwards.  Return for shrimp food challenge: You must be off antihistamines for 3-5 days before. Must be in good health and not ill. No vaccines/injections/antibiotics within the past 7 days. Plan on being in the office for 2-3 hours and must bring in the food you want to do the oral challenge for - 1 serving size of lightly seasoned fully cooked shrimp.   Environmental allergies Use over the counter antihistamines such as Zyrtec (cetirizine), Claritin (loratadine), Allegra (fexofenadine), or Xyzal (levocetirizine) daily as needed. May take twice a day during allergy flares. May switch antihistamines every few months. Use Flonase (fluticasone) nasal spray 1-2  sprays per nostril once a day as needed for nasal congestion.  Nasal saline spray (i.e., Simply Saline) or nasal saline lavage (i.e., NeilMed) is recommended as needed and prior to medicated nasal sprays.  Heartburn: Continue lifestyle and dietary modifications. Take omeprazole 20mg  in the morning. Nothing to eat or drink for 30 min afterwards.   Follow up in 3 months or sooner if needed.

## 2023-07-04 ENCOUNTER — Ambulatory Visit (HOSPITAL_BASED_OUTPATIENT_CLINIC_OR_DEPARTMENT_OTHER): Payer: 59 | Admitting: Student

## 2023-07-04 ENCOUNTER — Ambulatory Visit (INDEPENDENT_AMBULATORY_CARE_PROVIDER_SITE_OTHER): Payer: 59

## 2023-07-04 ENCOUNTER — Ambulatory Visit: Payer: 59 | Admitting: Orthopaedic Surgery

## 2023-07-04 ENCOUNTER — Encounter (HOSPITAL_BASED_OUTPATIENT_CLINIC_OR_DEPARTMENT_OTHER): Payer: Self-pay | Admitting: Student

## 2023-07-04 ENCOUNTER — Ambulatory Visit (INDEPENDENT_AMBULATORY_CARE_PROVIDER_SITE_OTHER): Payer: 59 | Admitting: Student

## 2023-07-04 DIAGNOSIS — G8929 Other chronic pain: Secondary | ICD-10-CM

## 2023-07-04 DIAGNOSIS — M25562 Pain in left knee: Secondary | ICD-10-CM | POA: Diagnosis not present

## 2023-07-04 MED ORDER — LIDOCAINE HCL 1 % IJ SOLN
4.0000 mL | INTRAMUSCULAR | Status: AC | PRN
Start: 2023-07-04 — End: 2023-07-04
  Administered 2023-07-04: 4 mL

## 2023-07-04 MED ORDER — TRIAMCINOLONE ACETONIDE 40 MG/ML IJ SUSP
2.0000 mL | INTRAMUSCULAR | Status: AC | PRN
Start: 2023-07-04 — End: 2023-07-04
  Administered 2023-07-04: 2 mL via INTRA_ARTICULAR
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
## 2023-07-06 ENCOUNTER — Encounter: Payer: Self-pay | Admitting: Allergy

## 2023-07-06 ENCOUNTER — Other Ambulatory Visit: Payer: Self-pay | Admitting: Allergy

## 2023-07-06 NOTE — Telephone Encounter (Signed)
Patient called in requesting refill today. Patient recently had it filled but it fell out of his pocket. Patient is fine with paying again for it. Would like refill sent in as soon as possible.

## 2023-07-09 ENCOUNTER — Encounter: Payer: Self-pay | Admitting: Internal Medicine

## 2023-07-09 ENCOUNTER — Other Ambulatory Visit: Payer: Self-pay | Admitting: Family Medicine

## 2023-07-09 ENCOUNTER — Ambulatory Visit: Payer: 59 | Admitting: Internal Medicine

## 2023-07-09 VITALS — BP 112/70 | HR 71 | Temp 98.4°F | Ht 78.0 in | Wt 358.0 lb

## 2023-07-09 DIAGNOSIS — J029 Acute pharyngitis, unspecified: Secondary | ICD-10-CM

## 2023-07-09 DIAGNOSIS — R09A2 Foreign body sensation, throat: Secondary | ICD-10-CM | POA: Diagnosis not present

## 2023-07-09 DIAGNOSIS — R070 Pain in throat: Secondary | ICD-10-CM

## 2023-07-09 MED ORDER — AMOXICILLIN-POT CLAVULANATE 875-125 MG PO TABS
1.0000 | ORAL_TABLET | Freq: Two times a day (BID) | ORAL | 0 refills | Status: DC
Start: 2023-07-09 — End: 2023-08-02

## 2023-07-09 NOTE — Progress Notes (Signed)
[facility]: [phone]   -- Medical Office Visit --  Patient:  Bradley [name]      Age: 27 y.o.       Sex:  male  Date:   07/09/2023 Patient Care Team: [name], MD as PCP - General (Family Medicine) York Spaniel, MD (Inactive) as Consulting Physician (Neurology) Today's Healthcare Provider: Lula Olszewski, MD   Assessment & Plan Throat discomfort  Throat infection  Foreign body sensation in throat  Assessment and Plan    Foreign Body Sensation in Throat He reports left-sided throat pain since eating seafood, particularly crustaceans, concerned about a possible retained shell or scratch. Oropharyngeal examination revealed no visible foreign body. We will start gargling with antiseptic mouthwash to potentially dislodge any foreign body and prevent infection. Cold foods such as popsicles and ice cream are recommended for symptomatic relief. Augmentin is prescribed to have on hand should fever or throat swelling develop, indicating a potential infection. If symptoms persist or worsen, a referral to ENT is advised. Immediate medical attention is necessary if he experiences difficulty breathing.      ED Discharge Orders          Ordered    amoxicillin-clavulanate (AUGMENTIN) 875-125 MG tablet  2 times daily        07/09/23 1140          Diagnoses and all orders for this visit: Throat discomfort Throat infection -     amoxicillin-clavulanate (AUGMENTIN) 875-125 MG tablet; Take 1 tablet by mouth 2 (two) times daily. Foreign body sensation in throat  Recommended follow-up: No follow-ups on file. Future Appointments  Date Time Provider Department Center  09/27/2023  1:30 PM Ellamae Sia, DO [facility] None       Subjective   27 y.o. male who has Anxiety; Generalized abdominal pain; Atypical chest pain; Mild intermittent asthma without complication; Migraine without aura and without status migrainosus, not intractable; Morbid obesity with  BMI of 40.0-44.9, adult (HCC); Hamstring injury, right, initial encounter; Food allergy; Shortness of breath; Low back pain; Mild persistent asthma, uncomplicated; Seasonal and perennial allergic rhinoconjunctivitis; Urticaria; Gastroesophageal reflux disease; Dysphagia; Right ankle pain; Allergy to alpha-gal; Not well controlled moderate persistent asthma; Dysfunction of right eustachian tube; Anal fissure; Folliculitis of nose; Lesion of palate; Oral leukoplakia; and Insect bites and stings on their problem list. His reasons/main concerns/chief complaints for today's office visit are Sore Throat (Pt reports he was eating seafood on Saturday. Experience throat pain on L side. Denied any other sx.  )   ------------------------------------------------------------------------------------------------------------------------ AI-Extracted: Discussed the use of AI scribe software for clinical note transcription with the patient, who gave verbal consent to proceed.  History of Present Illness   The patient, Mr. Bradley [name], presents with persistent left-sided throat pain that began after consuming seafood, specifically crustaceans, on a Friday night. The discomfort is localized to the posterior left side of the throat, slightly lower than the oropharynx, and is particularly noticeable when swallowing or when the mouth is dry. The pain has remained stable since its onset and is described as mild. The patient also reports associated symptoms of an upset stomach and neck soreness, which he attributes to the seafood consumption and sleeping position, respectively. There is no history of similar symptoms in the past. The patient denies any other new symptoms or changes in health.      He has a past medical history of Allergy to alpha-gal, Anxiety, Chronic headaches, Clavicular fracture, Diverticulosis, History of chickenpox, Mild  intermittent asthma (06/07/2018), Shingles, Tubular adenoma of colon, and Urticaria  (05/18/2020).  Problem list overviews that were updated at today's visit: No problems updated. Current Outpatient Medications on File Prior to Visit  Medication Sig   albuterol (PROVENTIL) (2.5 MG/3ML) 0.083% nebulizer solution Take 3 mLs (2.5 mg total) by nebulization every 4 (four) hours as needed for wheezing or shortness of breath (coughing fits).   albuterol (VENTOLIN HFA) 108 (90 Base) MCG/ACT inhaler INHALE 2 PUFFS INTO THE LUNGS EVERY 4 HOURS AS NEEDED FOR SHORTNESS OF BREATH OR WHEEZING   citalopram (CELEXA) 20 MG tablet TAKE 1 TABLET(20 MG) BY MOUTH DAILY   clonazePAM (KLONOPIN) 0.5 MG tablet TAKE 1 TABLET(0.5 MG) BY MOUTH TWICE DAILY AS NEEDED FOR ANXIETY   cycloSPORINE (RESTASIS OP) Apply to eye.   levocetirizine (XYZAL) 5 MG tablet Take 1 tablet (5 mg total) by mouth every evening.   EPINEPHrine (AUVI-Q) 0.3 mg/0.3 mL IJ SOAJ injection Inject 0.3 mg into the muscle as needed for anaphylaxis. (Patient not taking: Reported on 07/09/2023)   fluticasone furoate-vilanterol (BREO ELLIPTA) 200-25 MCG/ACT AEPB Inhale 1 puff into the lungs daily. Rinse your mouth after each use. (Patient not taking: Reported on 06/26/2023)   omeprazole (PRILOSEC) 40 MG capsule Take by mouth. (Patient not taking: Reported on 06/26/2023)   No current facility-administered medications on file prior to visit.  There are no discontinued medications.   Objective   Physical Exam  BP 112/70 (BP Location: Left Arm, Patient Position: Sitting, Cuff Size: Large)   Pulse 71   Temp 98.4 F (36.9 C) (Temporal)   Ht 6\' 6"  (1.981 m)   Wt (!) 358 lb (162.4 kg)   SpO2 97%   BMI 41.37 kg/m  Wt Readings from Last 10 Encounters:  07/09/23 (!) 358 lb (162.4 kg)  06/26/23 (!) 356 lb 12 oz (161.8 kg)  03/07/23 (!) 352 lb 3.2 oz (159.8 kg)  02/28/23 (!) 357 lb 8 oz (162.2 kg)  02/04/23 (!) 340 lb (154.2 kg)  01/23/23 (!) 356 lb 2 oz (161.5 kg)  01/11/23 (!) 356 lb (161.5 kg)  01/03/23 (!) 354 lb (160.6 kg)  11/29/22  (!) 354 lb (160.6 kg)  11/03/22 (!) 350 lb 1.6 oz (158.8 kg)   Vital signs reviewed.  Nursing notes reviewed. Weight trend reviewed. Abnormalities and Problem-Specific physical exam findings:  Physical Exam   HEENT: Oropharynx without abnormalities. Attempted to see left laryngoscope with video endoscope, unsuccessfully.     General Appearance:  No acute distress appreciable.   Well-groomed, healthy-appearing male.  Well proportioned with no abnormal fat distribution.  Good muscle tone. Pulmonary:  Normal work of breathing at rest, no respiratory distress apparent. SpO2: 97 %  Musculoskeletal: All extremities are intact.  Neurological:  Awake, alert, oriented, and engaged.  No obvious focal neurological deficits or cognitive impairments.  Sensorium seems unclouded.   Speech is clear and coherent with logical content. Psychiatric:  Appropriate mood, pleasant and cooperative demeanor, thoughtful and engaged during the exam  Results   Procedure: Endoscopic examination Description: The endoscope was inserted into the oropharynx. Visualization of the area of concern was not achieved.        No results found for any visits on 07/09/23.  Office Visit on 02/28/2023  Component Date Value   Lyme Total Antibody EIA 02/28/2023 Negative   Admission on 02/04/2023, Discharged on 02/04/2023  Component Date Value   Rapid Strep A Screen 02/04/2023 Negative    Specimen Description 02/04/2023 THROAT    Special  Requests 02/04/2023 NONE    Culture 02/04/2023                     Value:NO GROUP A STREP (S.PYOGENES) ISOLATED Performed at [facility], [address] [place], [place] [address]    Report Status 02/04/2023 02/07/2023 FINAL    SARS Coronavirus 2 02/04/2023 NEGATIVE   Office Visit on 10/19/2022  Component Date Value   Class Description Allerg* 10/19/2022 Comment    IgE (Immunoglobulin E), * 10/19/2022 84    O215-IgE Alpha-Gal 10/19/2022 0.97 (A)    Beef IgE 10/19/2022 0.53 (A)     Pork IgE 10/19/2022 0.36 (A)    Allergen Lamb IgE 10/19/2022 0.53 (A)    Clam IgE 10/19/2022 0.10 (A)    F023-IgE Crab 10/19/2022 <0.10    Shrimp IgE 10/19/2022 0.19 (A)    Scallop IgE 10/19/2022 0.12 (A)    F290-IgE Oyster 10/19/2022 <0.10    F080-IgE Lobster 10/19/2022 <0.10   Office Visit on 08/14/2022  Component Date Value   WBC 08/14/2022 6.5    RBC 08/14/2022 4.89    Hemoglobin 08/14/2022 14.6    HCT 08/14/2022 43.6    MCV 08/14/2022 89.1    MCHC 08/14/2022 33.6    RDW 08/14/2022 12.6    Platelets 08/14/2022 236.0    Neutrophils Relative % 08/14/2022 58.0    Lymphocytes Relative 08/14/2022 30.1    Monocytes Relative 08/14/2022 4.7    Eosinophils Relative 08/14/2022 6.5 (H)    Basophils Relative 08/14/2022 0.7    Neutro Abs 08/14/2022 3.8    Lymphs Abs 08/14/2022 2.0    Monocytes Absolute 08/14/2022 0.3    Eosinophils Absolute 08/14/2022 0.4    Basophils Absolute 08/14/2022 0.0    Sodium 08/14/2022 138    Potassium 08/14/2022 3.9    Chloride 08/14/2022 107    CO2 08/14/2022 23    Glucose, Bld 08/14/2022 80    BUN 08/14/2022 9    Creatinine, Ser 08/14/2022 0.84    Total Bilirubin 08/14/2022 0.5    Alkaline Phosphatase 08/14/2022 57    AST 08/14/2022 24    ALT 08/14/2022 27    Total Protein 08/14/2022 6.9    Albumin 08/14/2022 4.4    GFR 08/14/2022 119.83    Calcium 08/14/2022 8.7    No image results found.   DG Knee Complete 4 Views Left  Result Date: 07/07/2023 CLINICAL DATA:  Chronic left knee pain. EXAM: LEFT KNEE - COMPLETE 4 VIEW COMPARISON:  None Available. FINDINGS: No evidence of fracture, dislocation, or joint effusion. No evidence of arthropathy or other focal bone abnormality. Soft tissues are unremarkable. IMPRESSION: Negative. Electronically Signed   By: Layla Maw M.D.   On: 07/07/2023 22:01    No results found.     Additional Info: This encounter employed real-time, collaborative documentation. The patient actively reviewed and updated  their medical record on a shared screen, ensuring transparency and facilitating joint problem-solving for the problem list, overview, and plan. This approach promotes accurate, informed care. The treatment plan was discussed and reviewed in detail, including medication safety, potential side effects, and all patient questions. We confirmed understanding and comfort with the plan. Follow-up instructions were established, including contacting the office for any concerns, returning if symptoms worsen, persist, or new symptoms develop, and precautions for potential emergency department visits.

## 2023-07-09 NOTE — Patient Instructions (Signed)
VISIT SUMMARY:  Dear Mr. Kamau, during your visit, we discussed your persistent left-sided throat pain that began after you ate seafood, specifically crustaceans. You also mentioned an upset stomach and neck soreness. We examined your throat but did not find any visible foreign body. We suspect that you might have a small piece of shell or a scratch in your throat, which is causing the discomfort.  YOUR PLAN:  -FOREIGN BODY SENSATION IN THROAT: This is a feeling that something is stuck in your throat. We have recommended gargling with antiseptic mouthwash to potentially dislodge any foreign body and prevent infection. We also suggested eating cold foods like popsicles and ice cream for relief. We have prescribed Augmentin, an antibiotic, for you to take if you develop a fever or throat swelling, which could indicate an infection. If your symptoms persist or worsen, we may refer you to an Ear, Nose, and Throat (ENT) specialist. If you have difficulty breathing, seek immediate medical attention.  INSTRUCTIONS:  Please start gargling with antiseptic mouthwash and eat cold foods for relief. If you develop a fever or throat swelling, start taking the prescribed Augmentin. If your symptoms persist or worsen, please contact us for a referral to an ENT specialist. If you experience difficulty breathing, seek immediate medical attention.

## 2023-07-11 ENCOUNTER — Other Ambulatory Visit: Payer: Self-pay | Admitting: Family Medicine

## 2023-07-16 ENCOUNTER — Encounter (INDEPENDENT_AMBULATORY_CARE_PROVIDER_SITE_OTHER): Payer: Self-pay | Admitting: Otolaryngology

## 2023-07-19 DIAGNOSIS — R070 Pain in throat: Secondary | ICD-10-CM | POA: Insufficient documentation

## 2023-07-31 ENCOUNTER — Encounter: Payer: Self-pay | Admitting: Family

## 2023-07-31 ENCOUNTER — Ambulatory Visit: Payer: 59 | Admitting: Family

## 2023-07-31 VITALS — BP 114/74 | HR 79 | Temp 97.3°F | Ht 78.0 in | Wt 357.0 lb

## 2023-07-31 DIAGNOSIS — J029 Acute pharyngitis, unspecified: Secondary | ICD-10-CM

## 2023-07-31 LAB — POCT RAPID STREP A (OFFICE): Rapid Strep A Screen: NEGATIVE

## 2023-07-31 NOTE — Progress Notes (Signed)
Patient ID: Bradley [name], male    DOB: 18-Dec-1995, 27 y.o.   MRN: [id number]  Chief Complaint  Patient presents with   Sore Throat    Pt c/o of sore throat since yesterday, worse today, has not tried any OTC meds, has not been around anyone sick, not tested for Strep, COVID and flu.    Discussed the use of AI scribe software for clinical note transcription with the patient, who gave verbal consent to proceed.  History of Present Illness   The patient presents with a sore throat, which he initially attributed to a possible shell or crustacean piece lodged in his throat after eating seafood a month ago. He reports that the pain was initially on one side but has since spread to the entire throat. He also reports feeling warm and suspects he may be coming down with a virus. He has been experiencing some ear pain and an upset stomach, but denies having a fever or cough. He has a history of a positive strep test a few years ago. He has an upcoming appointment with an ENT to further evaluate his throat.     Assessment & Plan:     Pharyngitis - Negative rapid strep test. Throat redness without tonsillar inflammation or exudate. History of positive strep test as an adult. Recent exposure to seafood with subsequent throat pain, but likely unrelated to current symptoms. -Advise warm saltwater gargles. -Recommend over-the-counter ibuprofen or Advil up to 600mg  for pain, swelling, and potential fever.      Subjective:    Outpatient Medications Prior to Visit  Medication Sig Dispense Refill   albuterol (PROVENTIL) (2.5 MG/3ML) 0.083% nebulizer solution Take 3 mLs (2.5 mg total) by nebulization every 4 (four) hours as needed for wheezing or shortness of breath (coughing fits). 75 mL 1   albuterol (VENTOLIN HFA) 108 (90 Base) MCG/ACT inhaler INHALE 2 PUFFS INTO THE LUNGS EVERY 4 HOURS AS NEEDED FOR SHORTNESS OF BREATH OR WHEEZING 18 g 1   citalopram (CELEXA) 20 MG tablet TAKE 1 TABLET(20 MG) BY MOUTH  DAILY 90 tablet 0   clonazePAM (KLONOPIN) 0.5 MG tablet TAKE 1 TABLET(0.5 MG) BY MOUTH TWICE DAILY AS NEEDED FOR ANXIETY 60 tablet 0   cycloSPORINE (RESTASIS OP) Apply to eye.     EPINEPHrine (AUVI-Q) 0.3 mg/0.3 mL IJ SOAJ injection Inject 0.3 mg into the muscle as needed for anaphylaxis. 2 each 1   fluticasone furoate-vilanterol (BREO ELLIPTA) 200-25 MCG/ACT AEPB Inhale 1 puff into the lungs daily. Rinse your mouth after each use. 60 each 3   levocetirizine (XYZAL) 5 MG tablet Take 1 tablet (5 mg total) by mouth every evening. 30 tablet 5   omeprazole (PRILOSEC) 40 MG capsule Take by mouth.     amoxicillin-clavulanate (AUGMENTIN) 875-125 MG tablet Take 1 tablet by mouth 2 (two) times daily. (Patient not taking: Reported on 07/31/2023) 20 tablet 0   No facility-administered medications prior to visit.   Past Medical History:  Diagnosis Date   Allergy to alpha-gal    Anxiety    Chronic headaches    Clavicular fracture    Diverticulosis    History of chickenpox    Mild intermittent asthma 06/07/2018   Shingles    27 years old.   Tubular adenoma of colon    Urticaria 05/18/2020   Past Surgical History:  Procedure Laterality Date   COLONOSCOPY     CYSTECTOMY  01/2019   scrotum area    ESOPHAGEAL MANOMETRY N/A 06/09/2020  Procedure: ESOPHAGEAL MANOMETRY (EM);  Surgeon: Shellia Cleverly, DO;  Location: [facility];  Service: Gastroenterology;  Laterality: N/A;   ESOPHAGOGASTRODUODENOSCOPY     shoulder surgery Right 08/26/2019   WISDOM TOOTH EXTRACTION     Allergies  Allergen Reactions   Shellfish Allergy Anaphylaxis   Hydrocodone Itching    Pt states he feels itchy after taking, but it doesn't preclude him taking hydrocodone if needed About 2-3 years ago.  It was the first time he took hydrocodone.   Other Diarrhea    Slightly allergic to red meat since tick bite.Marland KitchenMarland KitchenNot anaphylaxis, but GI sensitivity.      Objective:    Physical Exam Vitals and nursing note reviewed.   Constitutional:      General: He is not in acute distress.    Appearance: Normal appearance.  HENT:     Head: Normocephalic.     Right Ear: Ear canal normal. No decreased hearing noted. Tenderness (erythema in canal) present. Tympanic membrane is injected (mild). Tympanic membrane is not erythematous or bulging.     Left Ear: Ear canal normal. No decreased hearing noted. Tenderness (erythema in canal) present. No drainage. Tympanic membrane is erythematous.     Nose:     Right Sinus: No maxillary sinus tenderness or frontal sinus tenderness.     Left Sinus: No maxillary sinus tenderness or frontal sinus tenderness.     Mouth/Throat:     Mouth: Mucous membranes are moist.     Pharynx: Posterior oropharyngeal erythema present. No pharyngeal swelling, oropharyngeal exudate or uvula swelling.     Tonsils: No tonsillar exudate or tonsillar abscesses. 1+ on the right. 1+ on the left.     Comments: left tonsill with oval open gap (hole), no signs of trauma or irritation noted. Cardiovascular:     Rate and Rhythm: Normal rate and regular rhythm.  Pulmonary:     Effort: Pulmonary effort is normal.     Breath sounds: Normal breath sounds.  Musculoskeletal:        General: Normal range of motion.     Cervical back: Normal range of motion.  Lymphadenopathy:     Head:     Right side of head: No preauricular or posterior auricular adenopathy.     Left side of head: No preauricular or posterior auricular adenopathy.     Cervical: No cervical adenopathy.  Skin:    General: Skin is warm and dry.  Neurological:     Mental Status: He is alert and oriented to person, place, and time.  Psychiatric:        Mood and Affect: Mood normal.    BP 114/74   Pulse 79   Temp (!) 97.3 F (36.3 C)   Ht 6\' 6"  (1.981 m)   Wt (!) 357 lb (161.9 kg)   SpO2 97%   BMI 41.26 kg/m  Wt Readings from Last 3 Encounters:  07/31/23 (!) 357 lb (161.9 kg)  07/09/23 (!) 358 lb (162.4 kg)  06/26/23 (!) 356 lb 12 oz  (161.8 kg)      Dulce Sellar, NP

## 2023-08-02 ENCOUNTER — Ambulatory Visit (INDEPENDENT_AMBULATORY_CARE_PROVIDER_SITE_OTHER): Payer: 59 | Admitting: Otolaryngology

## 2023-08-02 ENCOUNTER — Encounter (INDEPENDENT_AMBULATORY_CARE_PROVIDER_SITE_OTHER): Payer: Self-pay

## 2023-08-02 VITALS — Ht 78.0 in | Wt 357.0 lb

## 2023-08-02 DIAGNOSIS — J069 Acute upper respiratory infection, unspecified: Secondary | ICD-10-CM | POA: Diagnosis not present

## 2023-08-02 DIAGNOSIS — J029 Acute pharyngitis, unspecified: Secondary | ICD-10-CM

## 2023-08-02 DIAGNOSIS — R09A2 Foreign body sensation, throat: Secondary | ICD-10-CM

## 2023-08-02 DIAGNOSIS — R070 Pain in throat: Secondary | ICD-10-CM | POA: Diagnosis not present

## 2023-08-02 MED ORDER — METHYLPREDNISOLONE 4 MG PO TBPK: ORAL_TABLET | ORAL | 0 refills | Status: DC

## 2023-08-02 MED ORDER — AMOXICILLIN-POT CLAVULANATE 875-125 MG PO TABS: 1.0000 | ORAL_TABLET | Freq: Two times a day (BID) | ORAL | 0 refills | Status: AC

## 2023-08-02 NOTE — Progress Notes (Signed)
Dear Dr. Jon Billings, Here is my assessment for our mutual patient, Bradley [name]. Thank you for allowing me the opportunity to care for your patient. Please do not hesitate to contact me should you have any other questions. Sincerely, Dr. Jovita Kussmaul  Otolaryngology Clinic Note Referring provider: Dr. Jon Billings HPI:  Bradley [name] is a 27 y.o. male kindly referred by Dr. Jon Billings for evaluation of sore throat  He reports that he first had some left sided throat discomfort and FB sensation in his throat starting about a month ago - after he had seafood. He was prescribed augmentin after being evaluted in the ED, and saw Bradley [name] and had a scope, which was reassuring. He then felt a bit better, and then came down with URI symptoms about a week ago. He wanted to make sure there was no other significant issues and presents here.  Today, he reports some nasal congestion, cough, some URI symptoms and some abdominal discomfort but denies odynophagia, dysphagia, neck masses, ear pain, unintentional weight loss. He reports left sided throat discomfort has resolved but now feels some right sided discomfort.   Chart review shows he has seen multiple [name] providers for various complaints  Personal or FHx of bleeding dz or anesthesia difficulty: no   Tobacco: denies.  Independent Review of Additional Tests or Records:  Prior [name] notes reviewed including endoscopy notes PCP and ED notes reviewed and summarized above  PMH/Meds/All/SocHx/FamHx/ROS:   Past Medical History:  Diagnosis Date   Allergy to alpha-gal    Anxiety    Chronic headaches    Clavicular fracture    Diverticulosis    History of chickenpox    Mild intermittent asthma 06/07/2018   Shingles    27 years old.   Tubular adenoma of colon    Urticaria 05/18/2020     Past Surgical History:  Procedure Laterality Date   COLONOSCOPY     CYSTECTOMY  01/2019   scrotum area    ESOPHAGEAL MANOMETRY N/A 06/09/2020   Procedure: ESOPHAGEAL  MANOMETRY (EM);  Surgeon: Shellia Cleverly, DO;  Location: [facility];  Service: Gastroenterology;  Laterality: N/A;   ESOPHAGOGASTRODUODENOSCOPY     shoulder surgery Right 08/26/2019   WISDOM TOOTH EXTRACTION      Family History  Problem Relation Age of Onset   Hypertension Mother    Colon polyps Mother        might be mistakrn   Diabetes Father        type II   Migraines Sister    Colon cancer Neg Hx    Esophageal cancer Neg Hx    Rectal cancer Neg Hx    Stomach cancer Neg Hx      Social Connections: Unknown (02/06/2022)   Received from [facility], [facility]   Social Network    Social Network: Not on file      Current Outpatient Medications:    albuterol (PROVENTIL) (2.5 MG/3ML) 0.083% nebulizer solution, Take 3 mLs (2.5 mg total) by nebulization every 4 (four) hours as needed for wheezing or shortness of breath (coughing fits)., Disp: 75 mL, Rfl: 1   albuterol (VENTOLIN HFA) 108 (90 Base) MCG/ACT inhaler, INHALE 2 PUFFS INTO THE LUNGS EVERY 4 HOURS AS NEEDED FOR SHORTNESS OF BREATH OR WHEEZING, Disp: 18 g, Rfl: 1   citalopram (CELEXA) 20 MG tablet, TAKE 1 TABLET(20 MG) BY MOUTH DAILY, Disp: 90 tablet, Rfl: 0   clonazePAM (KLONOPIN) 0.5 MG tablet, TAKE 1 TABLET(0.5 MG) BY MOUTH TWICE DAILY AS NEEDED  FOR ANXIETY, Disp: 60 tablet, Rfl: 0   cycloSPORINE (RESTASIS OP), Apply to eye., Disp: , Rfl:    EPINEPHrine (AUVI-Q) 0.3 mg/0.3 mL IJ SOAJ injection, Inject 0.3 mg into the muscle as needed for anaphylaxis., Disp: 2 each, Rfl: 1   fluticasone furoate-vilanterol (BREO ELLIPTA) 200-25 MCG/ACT AEPB, Inhale 1 puff into the lungs daily. Rinse your mouth after each use., Disp: 60 each, Rfl: 3   levocetirizine (XYZAL) 5 MG tablet, Take 1 tablet (5 mg total) by mouth every evening., Disp: 30 tablet, Rfl: 5   methylPREDNISolone (MEDROL DOSEPAK) 4 MG TBPK tablet, Take as prescribed, Disp: 1 each, Rfl: 0   omeprazole (PRILOSEC) 40 MG capsule, Take by mouth., Disp: , Rfl:     amoxicillin-clavulanate (AUGMENTIN) 875-125 MG tablet, Take 1 tablet by mouth 2 (two) times daily for 7 days., Disp: 14 tablet, Rfl: 0   Physical Exam:   Ht 6\' 6"  (1.981 m)   Wt (!) 357 lb (161.9 kg)   BMI 41.26 kg/m   Salient findings:  CN II-XII intact  Bilateral EAC clear and TM intact with well pneumatized middle ear spaces Anterior rhinoscopy: Septum relatively midline; bilateral inferior turbinates with modest hypertrophy - some mucoid secretions No lesions of oral cavity/oropharynx; dentition fair No obviously palpable neck masses/lymphadenopathy/thyromegaly No respiratory distress or stridor; voice quality class I --- given chief complaint, TFL (see below) was indicated for further management  Seprately Identifiable Procedures:  Procedure Note Pre-procedure diagnosis:  Throat pain, foreign body sensation in throat Post-procedure diagnosis: Same Procedure: Transnasal Fiberoptic Laryngoscopy, CPT 16109 - Mod 25 Indication: throat pain, foreign body sensation in throat Complications: None apparent EBL: 0 mL Date: 08/02/23   The procedure was undertaken to further evaluate the patient's complaint of throat pain, foreign body sensation in throat, with mirror exam inadequate for appropriate examination due to gag reflex and poor patient tolerance  Procedure:  Patient was identified as correct patient. Verbal consent was obtained. The nose was sprayed with oxymetazoline and 4% lidocaine. The The flexible laryngoscope was passed through the nose to view the nasal cavity, pharynx (oropharynx, hypopharynx) and larynx.  The larynx was examined at rest and during multiple phonatory tasks. Documentation was obtained and reviewed with patient. The scope was removed. The patient tolerated the procedure well.  Findings: The nasal cavity and nasopharynx did not reveal any masses or lesions, mucosa appeared to be without obvious lesions. The tongue base, pharyngeal walls, piriform sinuses,  vallecula, epiglottis and postcricoid region are normal in appearance without foreign bodies. The visualized portion of the subglottis and proximal trachea is widely patent. The vocal folds are mobile bilaterally. There are no lesions on the free edge of the vocal folds nor elsewhere in the larynx worrisome for malignancy.    Electronically signed by: Bradley Drivers, Bradley [name] 08/02/2023 4:42 PM   Impression & Plans:  Bradley [name] is a 27 y.o. male with Foreign body sensation in throat Throat discomfort URI TFL is reassuring and he is having some URI symptoms. We discussed management of his symptoms, and he was reassured that there was no foreign body or masses. We discussed management of likely URI, and given persistent symptoms, will treat - Augmentin BID x7d - Medrol dose pack  - f/u PRN - if not improving  See below regarding exact medications prescribed this encounter including dosages and route: Meds ordered this encounter  Medications   amoxicillin-clavulanate (AUGMENTIN) 875-125 MG tablet    Sig: Take 1 tablet by mouth 2 (two) times  daily for 7 days.    Dispense:  14 tablet    Refill:  0   methylPREDNISolone (MEDROL DOSEPAK) 4 MG TBPK tablet    Sig: Take as prescribed    Dispense:  1 each    Refill:  0      Thank you for allowing me the opportunity to care for your patient. Please do not hesitate to contact me should you have any other questions.  Sincerely, Jovita Kussmaul, Bradley [name] Otolarynoglogist ([name]), [name] Phone: [phone] Fax: [phone]  08/02/2023, 4:08 PM   MDM:  Level 3 Complexity/Problems addressed: low Data complexity: low  - Morbidity: mod - Prescription Drug prescribed or managed: yes

## 2023-08-17 ENCOUNTER — Other Ambulatory Visit: Payer: Self-pay | Admitting: Family Medicine

## 2023-08-30 ENCOUNTER — Encounter (HOSPITAL_BASED_OUTPATIENT_CLINIC_OR_DEPARTMENT_OTHER): Payer: Self-pay

## 2023-08-30 ENCOUNTER — Ambulatory Visit (INDEPENDENT_AMBULATORY_CARE_PROVIDER_SITE_OTHER): Payer: 59 | Admitting: Student

## 2023-08-30 ENCOUNTER — Encounter (HOSPITAL_BASED_OUTPATIENT_CLINIC_OR_DEPARTMENT_OTHER): Payer: Self-pay | Admitting: Student

## 2023-08-30 DIAGNOSIS — G8929 Other chronic pain: Secondary | ICD-10-CM

## 2023-08-30 DIAGNOSIS — M25562 Pain in left knee: Secondary | ICD-10-CM | POA: Diagnosis not present
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
## 2023-08-31 ENCOUNTER — Other Ambulatory Visit: Payer: Self-pay | Admitting: Family Medicine

## 2023-08-31 ENCOUNTER — Other Ambulatory Visit: Payer: Self-pay | Admitting: Allergy

## 2023-08-31 ENCOUNTER — Encounter: Payer: Self-pay | Admitting: Family Medicine

## 2023-09-02 NOTE — Telephone Encounter (Signed)
Refilled Klonopin, but recommend office follow up to discuss possible alternatives to long term use.  [name] MD [facility]

## 2023-09-03 ENCOUNTER — Ambulatory Visit (INDEPENDENT_AMBULATORY_CARE_PROVIDER_SITE_OTHER): Payer: 59 | Admitting: Family Medicine

## 2023-09-03 ENCOUNTER — Encounter: Payer: Self-pay | Admitting: Family Medicine

## 2023-09-03 VITALS — BP 135/79 | HR 70 | Temp 97.8°F | Resp 18 | Ht 78.0 in | Wt 355.0 lb

## 2023-09-03 DIAGNOSIS — L03011 Cellulitis of right finger: Secondary | ICD-10-CM

## 2023-09-03 MED ORDER — SULFAMETHOXAZOLE-TRIMETHOPRIM 800-160 MG PO TABS: 1.0000 | ORAL_TABLET | Freq: Two times a day (BID) | ORAL | 0 refills | Status: DC

## 2023-09-03 MED ORDER — SILDENAFIL CITRATE 100 MG PO TABS: ORAL_TABLET | ORAL | 1 refills | Status: DC

## 2023-09-03 NOTE — Progress Notes (Signed)
Subjective:     Patient ID: Bradley [name], male    DOB: [birth year], 27 y.o.   MRN: [id number]  Chief Complaint  Patient presents with   Insect Bite    Possible insect bite on right index finger, red, pus and painful, happened 4 or 5 day ago    HPI Got spot R index finger on 12/4. No specific injury/known bug bite, etc.   Came to a "head" yesterday and pt "popped" it .  Got a lot of clear fluid and pain improved.   Was very painful. .  Pain was radiating down finger.  Now, some redness down to MCP and middle finger as well    no f/c  does get "boils" frequently, but never on hands  There are no preventive care reminders to display for this patient.  Past Medical History:  Diagnosis Date   Allergy to alpha-gal    Anxiety    Chronic headaches    Clavicular fracture    Diverticulosis    History of chickenpox    Mild intermittent asthma 06/07/2018   Shingles    27 years old.   Tubular adenoma of colon    Urticaria 05/18/2020    Past Surgical History:  Procedure Laterality Date   COLONOSCOPY     CYSTECTOMY  01/2019   scrotum area    ESOPHAGEAL MANOMETRY N/A 06/09/2020   Procedure: ESOPHAGEAL MANOMETRY (EM);  Surgeon: Shellia Cleverly, DO;  Location: [facility];  Service: Gastroenterology;  Laterality: N/A;   ESOPHAGOGASTRODUODENOSCOPY     shoulder surgery Right 08/26/2019   WISDOM TOOTH EXTRACTION       Current Outpatient Medications:    albuterol (PROVENTIL) (2.5 MG/3ML) 0.083% nebulizer solution, Take 3 mLs (2.5 mg total) by nebulization every 4 (four) hours as needed for wheezing or shortness of breath (coughing fits)., Disp: 75 mL, Rfl: 1   albuterol (VENTOLIN HFA) 108 (90 Base) MCG/ACT inhaler, INHALE 2 PUFFS INTO THE LUNGS EVERY 4 HOURS AS NEEDED FOR SHORTNESS OF BREATH OR WHEEZING, Disp: 8.5 g, Rfl: 1   citalopram (CELEXA) 20 MG tablet, TAKE 1 TABLET(20 MG) BY MOUTH DAILY, Disp: 90 tablet, Rfl: 0   clonazePAM (KLONOPIN) 0.5 MG tablet, TAKE 1 TABLET(0.5 MG) BY  MOUTH TWICE DAILY AS NEEDED FOR ANXIETY, Disp: 30 tablet, Rfl: 0   cycloSPORINE (RESTASIS OP), Apply to eye., Disp: , Rfl:    EPINEPHrine (AUVI-Q) 0.3 mg/0.3 mL IJ SOAJ injection, Inject 0.3 mg into the muscle as needed for anaphylaxis., Disp: 2 each, Rfl: 1   fluticasone furoate-vilanterol (BREO ELLIPTA) 200-25 MCG/ACT AEPB, Inhale 1 puff into the lungs daily. Rinse your mouth after each use., Disp: 60 each, Rfl: 3   levocetirizine (XYZAL) 5 MG tablet, Take 1 tablet (5 mg total) by mouth every evening., Disp: 30 tablet, Rfl: 5   omeprazole (PRILOSEC) 40 MG capsule, Take by mouth., Disp: , Rfl:    sildenafil (VIAGRA) 100 MG tablet, TAKE 1/2 TO 1 TABLET(50 TO 100 MG) BY MOUTH DAILY AS NEEDED FOR ERECTILE DYSFUNCTION, Disp: 10 tablet, Rfl: 1   sulfamethoxazole-trimethoprim (BACTRIM DS) 800-160 MG tablet, Take 1 tablet by mouth 2 (two) times daily., Disp: 14 tablet, Rfl: 0  Allergies  Allergen Reactions   Shellfish Allergy Anaphylaxis   Hydrocodone Itching    Pt states he feels itchy after taking, but it doesn't preclude him taking hydrocodone if needed About 2-3 years ago.  It was the first time he took hydrocodone.   Other Diarrhea  Slightly allergic to red meat since tick bite.Marland KitchenMarland KitchenNot anaphylaxis, but GI sensitivity.   ROS neg/noncontributory except as noted HPI/below      Objective:     BP 135/79   Pulse 70   Temp 97.8 F (36.6 C) (Temporal)   Resp 18   Ht 6\' 6"  (1.981 m)   Wt (!) 355 lb (161 kg)   SpO2 97%   BMI 41.02 kg/m  Wt Readings from Last 3 Encounters:  09/03/23 (!) 355 lb (161 kg)  08/02/23 (!) 357 lb (161.9 kg)  07/31/23 (!) 357 lb (161.9 kg)    Physical Exam   Gen: [facility]N NAD HEENT: NCAT, conjunctiva not injected, sclera nonicteric MSK: no gross abnormalities.  NEURO: A&O x3.  CN II-XII intact.  PSYCH: normal mood. Good eye contact  R index finger.  Dorsum between MCP and PIP-scabbed.  Surrounding redness.  Tender.  Non fluctuant.  Some faint pinkness finger  as well near MCP area and on middle finger as well.  Not really warm.      Assessment & Plan:  Cellulitis of finger of right hand  Other orders -     Sulfamethoxazole-Trimethoprim; Take 1 tablet by mouth 2 (two) times daily.  Dispense: 14 tablet; Refill: 0  Cellulitis probably.  Doubt herpetic whitlow or viral.  ? From bug bite or other source.  Soaks.  Bactrim DS bid(sensitive to doxy).  Worse, fevers, etc, ER or let us know.    Return if symptoms worsen or fail to improve.  Angelena Sole, MD

## 2023-09-03 NOTE — Patient Instructions (Addendum)
It was very nice to see you today!  Worse, fevers, etc.  ER  Soaks few times/day.  Bactrim sent to pharmacy  Happy Holidays   PLEASE NOTE:  If you had any lab tests please let us know if you have not heard back within a few days. You may see your results on MyChart before we have a chance to review them but we will give you a call once they are reviewed by [place]. If we ordered any referrals today, please let us know if you have not heard from their office within the next week.   Please try these tips to maintain a healthy lifestyle:  Eat most of your calories during the day when you are active. Eliminate processed foods including packaged sweets (pies, cakes, cookies), reduce intake of potatoes, white bread, white pasta, and white rice. Look for whole grain options, oat flour or almond flour.  Each meal should contain half fruits/vegetables, one quarter protein, and one quarter carbs (no bigger than a computer mouse).  Cut down on sweet beverages. This includes juice, soda, and sweet tea. Also watch fruit intake, though this is a healthier sweet option, it still contains natural sugar! Limit to 3 servings daily.  Drink at least 1 glass of water with each meal and aim for at least 8 glasses per day  Exercise at least 150 minutes every week.

## 2023-09-05 ENCOUNTER — Telehealth (INDEPENDENT_AMBULATORY_CARE_PROVIDER_SITE_OTHER): Payer: 59 | Admitting: Family Medicine

## 2023-09-05 ENCOUNTER — Encounter: Payer: Self-pay | Admitting: Family Medicine

## 2023-09-05 DIAGNOSIS — F419 Anxiety disorder, unspecified: Secondary | ICD-10-CM | POA: Diagnosis not present

## 2023-09-05 NOTE — Progress Notes (Signed)
Patient ID: Bradley [name], male   DOB: [birth year], 27 y.o.   MRN: [id number]   Virtual Visit via Video Note  I connected with Brynda Peon on 09/05/23 at  5:00 PM EST by a video enabled telemedicine application and verified that I am speaking with the correct person using two identifiers.  Location patient: home Location provider:work or home office Persons participating in the virtual visit: patient, provider  I discussed the limitations of evaluation and management by telemedicine and the availability of in person appointments. The patient expressed understanding and agreed to proceed.   HPI:  Leo has history of migraine headaches, intermittent asthma, allergic rhinitis, GERD.  Had recently had some left knee pains and saw orthopedist and has MRI of the knee scheduled for the 19th of this month.\  He had called recently for refills of Klonopin.  We recommended follow-up to discuss indications and potential alternatives.  He states he has really never been diagnosed with any specific anxiety disorder.  No known history of panic disorder or generalized anxiety.  Sometimes gets very stressed with things like work.  No social anxiety issues.  No history of OCD.  He does take citalopram 20 mg but very irregular with taking this.  He had recently started taking clonazepam more frequently.  We expressed our concerns regarding potential for long-term issues with memory loss.   ROS: See pertinent positives and negatives per HPI.  Past Medical History:  Diagnosis Date   Allergy to alpha-gal    Anxiety    Chronic headaches    Clavicular fracture    Diverticulosis    History of chickenpox    Mild intermittent asthma 06/07/2018   Shingles    27 years old.   Tubular adenoma of colon    Urticaria 05/18/2020    Past Surgical History:  Procedure Laterality Date   COLONOSCOPY     CYSTECTOMY  01/2019   scrotum area    ESOPHAGEAL MANOMETRY N/A 06/09/2020   Procedure: ESOPHAGEAL MANOMETRY  (EM);  Surgeon: Shellia Cleverly, DO;  Location: [facility];  Service: Gastroenterology;  Laterality: N/A;   ESOPHAGOGASTRODUODENOSCOPY     shoulder surgery Right 08/26/2019   WISDOM TOOTH EXTRACTION      Family History  Problem Relation Age of Onset   Hypertension Mother    Colon polyps Mother        might be mistakrn   Diabetes Father        type II   Migraines Sister    Colon cancer Neg Hx    Esophageal cancer Neg Hx    Rectal cancer Neg Hx    Stomach cancer Neg Hx     SOCIAL HX: Non-smoker   Current Outpatient Medications:    albuterol (PROVENTIL) (2.5 MG/3ML) 0.083% nebulizer solution, Take 3 mLs (2.5 mg total) by nebulization every 4 (four) hours as needed for wheezing or shortness of breath (coughing fits)., Disp: 75 mL, Rfl: 1   albuterol (VENTOLIN HFA) 108 (90 Base) MCG/ACT inhaler, INHALE 2 PUFFS INTO THE LUNGS EVERY 4 HOURS AS NEEDED FOR SHORTNESS OF BREATH OR WHEEZING, Disp: 8.5 g, Rfl: 1   citalopram (CELEXA) 20 MG tablet, TAKE 1 TABLET(20 MG) BY MOUTH DAILY, Disp: 90 tablet, Rfl: 0   clonazePAM (KLONOPIN) 0.5 MG tablet, TAKE 1 TABLET(0.5 MG) BY MOUTH TWICE DAILY AS NEEDED FOR ANXIETY, Disp: 30 tablet, Rfl: 0   cycloSPORINE (RESTASIS OP), Apply to eye., Disp: , Rfl:    EPINEPHrine (AUVI-Q) 0.3 mg/0.3 mL IJ SOAJ  injection, Inject 0.3 mg into the muscle as needed for anaphylaxis., Disp: 2 each, Rfl: 1   fluticasone furoate-vilanterol (BREO ELLIPTA) 200-25 MCG/ACT AEPB, Inhale 1 puff into the lungs daily. Rinse your mouth after each use., Disp: 60 each, Rfl: 3   levocetirizine (XYZAL) 5 MG tablet, Take 1 tablet (5 mg total) by mouth every evening., Disp: 30 tablet, Rfl: 5   omeprazole (PRILOSEC) 40 MG capsule, Take by mouth., Disp: , Rfl:    sildenafil (VIAGRA) 100 MG tablet, TAKE 1/2 TO 1 TABLET(50 TO 100 MG) BY MOUTH DAILY AS NEEDED FOR ERECTILE DYSFUNCTION, Disp: 10 tablet, Rfl: 1   sulfamethoxazole-trimethoprim (BACTRIM DS) 800-160 MG tablet, Take 1 tablet by mouth  2 (two) times daily., Disp: 14 tablet, Rfl: 0  EXAM:  VITALS per patient if applicable:  GENERAL: alert, oriented, appears well and in no acute distress  HEENT: atraumatic, conjunttiva clear, no obvious abnormalities on inspection of external nose and ears  NECK: normal movements of the head and neck  LUNGS: on inspection no signs of respiratory distress, breathing rate appears normal, no obvious gross SOB, gasping or wheezing  CV: no obvious cyanosis  MS: moves all visible extremities without noticeable abnormality  PSYCH/NEURO: pleasant and cooperative, no obvious depression or anxiety, speech and thought processing grossly intact  ASSESSMENT AND PLAN:  Discussed the following assessment and plan:  Chronic intermittent anxiety.  Does not have specific anxiety disorder such as panic disorder, generalized anxiety, OCD, social anxiety disorder.  We expressed our concerns regarding clonazepam especially his age with long-term use specifically with regard to memory disturbance.  We have recommended he instead take the Celexa regularly to see how much benefit he can get from that.  Also would focus on nonpharmacologic ways to reduce stress and anxiety.  -Set up complete physical     I discussed the assessment and treatment plan with the patient. The patient was provided an opportunity to ask questions and all were answered. The patient agreed with the plan and demonstrated an understanding of the instructions.   The patient was advised to call back or seek an in-person evaluation if the symptoms worsen or if the condition fails to improve as anticipated.     Evelena Peat, MD

## 2023-09-12 ENCOUNTER — Other Ambulatory Visit: Payer: Self-pay | Admitting: Family Medicine

## 2023-09-13 ENCOUNTER — Ambulatory Visit
Admission: RE | Admit: 2023-09-13 | Discharge: 2023-09-13 | Disposition: A | Discharge status: Home / Self Care | Payer: 59 | Source: Ambulatory Visit | Attending: Student | Admitting: Student

## 2023-09-13 DIAGNOSIS — G8929 Other chronic pain: Secondary | ICD-10-CM

## 2023-09-21 ENCOUNTER — Ambulatory Visit (HOSPITAL_BASED_OUTPATIENT_CLINIC_OR_DEPARTMENT_OTHER): Payer: 59 | Admitting: Student

## 2023-09-27 ENCOUNTER — Encounter: Payer: 59 | Admitting: Allergy

## 2023-10-02 ENCOUNTER — Ambulatory Visit (INDEPENDENT_AMBULATORY_CARE_PROVIDER_SITE_OTHER): Payer: 59 | Admitting: Family Medicine

## 2023-10-02 ENCOUNTER — Encounter: Payer: Self-pay | Admitting: Family Medicine

## 2023-10-02 VITALS — BP 126/76 | HR 76 | Temp 98.3°F | Wt 360.7 lb

## 2023-10-02 DIAGNOSIS — M546 Pain in thoracic spine: Secondary | ICD-10-CM | POA: Diagnosis not present

## 2023-10-02 NOTE — Patient Instructions (Signed)
 Suspect most likely muscular back pain  Try conservative things including heat, topical sports creams, muscle massage, short-term use of Aleve or Advil  Follow-up immediately for any blood in urine, fever, or worsening abdominal pain

## 2023-10-02 NOTE — Progress Notes (Signed)
 Established Patient Office Visit  Subjective   Patient ID: Bradley [name], male    DOB: Jan 09, 1996  Age: 28 y.o. MRN: [id number]  Chief Complaint  Patient presents with   Back Pain    Patient complains of back pain, x2 weeks, Tried Aleve     HPI   Sir seen with onset on Christmas Day of sharp stabbing type pain left flank region are actually just slightly above this region.  He ended up taking some Aleve  with minimal relief.  He went to urgent care the very next day and had urine dipstick which did not show any blood.  Was felt to have musculoskeletal source of back pain.  He did have some pain rating toward the abdomen.  Denied any injury.  Pain right mid abdominal region was worse with coughing.  Back pain worse with movement.  He has had history of 1 kidney stone in 1 addition of this might be a kidney stone.  Pain has been very intermittent.  Sometimes can last several hours.  7 out of 10 intensity.  None currently.  Urgent care notes were reviewed.  Denies any skin rash.  He reportedly had left otitis and was treated with Augmentin .  Ear symptoms improved at this time  Past Medical History:  Diagnosis Date   Allergy to alpha-gal    Anxiety    Chronic headaches    Clavicular fracture    Diverticulosis    History of chickenpox    Mild intermittent asthma 06/07/2018   Shingles    28 years old.   Tubular adenoma of colon    Urticaria 05/18/2020   Past Surgical History:  Procedure Laterality Date   COLONOSCOPY     CYSTECTOMY  01/2019   scrotum area    ESOPHAGEAL MANOMETRY N/A 06/09/2020   Procedure: ESOPHAGEAL MANOMETRY (EM);  Surgeon: San Sandor GAILS, DO;  Location: [facility];  Service: Gastroenterology;  Laterality: N/A;   ESOPHAGOGASTRODUODENOSCOPY     shoulder surgery Right 08/26/2019   WISDOM TOOTH EXTRACTION      reports that he has never smoked. He has never used smokeless tobacco. He reports current alcohol use of about 3.0 standard drinks of alcohol per week.  He reports current drug use. Frequency: 2.00 times per week. Drug: Marijuana. family history includes Colon polyps in his mother; Diabetes in his father; Hypertension in his mother; Migraines in his sister. Allergies  Allergen Reactions   Shellfish Allergy Anaphylaxis   Hydrocodone Itching    Pt states he feels itchy after taking, but it doesn't preclude him taking hydrocodone if needed About 2-3 years ago.  It was the first time he took hydrocodone.   Other Diarrhea    Slightly allergic to red meat since tick bite.SABRASABRANot anaphylaxis, but GI sensitivity.    Review of Systems  Constitutional:  Negative for chills and fever.  Genitourinary:  Negative for dysuria.  Musculoskeletal:  Positive for back pain.  Skin:  Negative for rash.      Objective:     BP 126/76 (BP Location: Left Arm, Patient Position: Sitting, Cuff Size: Large)   Pulse 76   Temp 98.3 F (36.8 C) (Oral)   Wt (!) 360 lb 11.2 oz (163.6 kg)   SpO2 97%   BMI 41.68 kg/m  BP Readings from Last 3 Encounters:  10/02/23 126/76  09/03/23 135/79  07/31/23 114/74   Wt Readings from Last 3 Encounters:  10/02/23 (!) 360 lb 11.2 oz (163.6 kg)  09/03/23 (!) 355 lb (161  kg)  08/02/23 (!) 357 lb (161.9 kg)      Physical Exam Vitals reviewed.  Constitutional:      General: He is not in acute distress.    Appearance: He is not ill-appearing.  Cardiovascular:     Rate and Rhythm: Normal rate and regular rhythm.  Pulmonary:     Effort: Pulmonary effort is normal.     Breath sounds: Normal breath sounds.  Abdominal:     Palpations: Abdomen is soft. There is no mass.     Tenderness: There is no abdominal tenderness. There is no guarding or rebound.  Musculoskeletal:     Comments: No reproducible back tenderness.  Neurological:     Mental Status: He is alert.      No results found for any visits on 10/02/23.    The ASCVD Risk score (Arnett DK, et al., 2019) failed to calculate for the following reasons:   The  2019 ASCVD risk score is only valid for ages 57 to 13    Assessment & Plan:   Acute to semi-acute left sided thoracic back pain.  Went to urgent care and had urine dipstick which showed no blood.  Doubt kidney stones given location which is somewhat superior to kidney and also symptoms worse with movement.  Currently pain-free.  Does not have any concerning symptoms such as fever  -Treat conservatively with topical heat, sports creams, muscle massage, short-term use of Aleve  or Advil . -Touch base if not improving over the next week or 2 -Set up complete physical soon   Wolm Scarlet, MD

## 2023-10-03 ENCOUNTER — Other Ambulatory Visit: Payer: Self-pay | Admitting: Allergy

## 2023-10-04 ENCOUNTER — Other Ambulatory Visit: Payer: Self-pay | Admitting: *Deleted

## 2023-10-04 ENCOUNTER — Other Ambulatory Visit: Payer: Self-pay | Admitting: Allergy

## 2023-10-04 ENCOUNTER — Ambulatory Visit (INDEPENDENT_AMBULATORY_CARE_PROVIDER_SITE_OTHER): Payer: 59 | Admitting: Otolaryngology

## 2023-10-04 ENCOUNTER — Encounter: Payer: Self-pay | Admitting: Allergy

## 2023-10-04 ENCOUNTER — Encounter (INDEPENDENT_AMBULATORY_CARE_PROVIDER_SITE_OTHER): Payer: Self-pay

## 2023-10-04 VITALS — BP 132/89 | HR 104 | Ht 78.0 in | Wt 335.0 lb

## 2023-10-04 DIAGNOSIS — J3489 Other specified disorders of nose and nasal sinuses: Secondary | ICD-10-CM

## 2023-10-04 DIAGNOSIS — R070 Pain in throat: Secondary | ICD-10-CM | POA: Diagnosis not present

## 2023-10-04 DIAGNOSIS — R09A2 Foreign body sensation, throat: Secondary | ICD-10-CM

## 2023-10-04 DIAGNOSIS — J343 Hypertrophy of nasal turbinates: Secondary | ICD-10-CM

## 2023-10-04 DIAGNOSIS — R0982 Postnasal drip: Secondary | ICD-10-CM

## 2023-10-04 DIAGNOSIS — R0981 Nasal congestion: Secondary | ICD-10-CM

## 2023-10-04 MED ORDER — ALBUTEROL SULFATE HFA 108 (90 BASE) MCG/ACT IN AERS: INHALATION_SPRAY | RESPIRATORY_TRACT | 1 refills | Status: DC

## 2023-10-04 MED ORDER — FLUTICASONE PROPIONATE 50 MCG/ACT NA SUSP: 2.0000 | Freq: Two times a day (BID) | NASAL | 6 refills | Status: DC

## 2023-10-04 NOTE — Progress Notes (Signed)
 Dear Dr. Micheal, Here is my assessment for our mutual patient, Bradley [name]. Thank you for allowing me the opportunity to care for your patient. Please do not hesitate to contact me should you have any other questions. Sincerely, Dr. Eldora Blanch  Otolaryngology Clinic Note Referring provider: Dr. Micheal HPI:  Bradley [name] is a 28 y.o. male kindly referred by Dr. Micheal for evaluation of sore throat  Initial visit (07/2023): He reports that he first had some left sided throat discomfort and FB sensation in his throat starting about a month ago - after he had seafood. He was prescribed augmentin  after being evaluted in the ED, and saw [facility]NT and had a scope, which was reassuring. He then felt a bit better, and then came down with URI symptoms about a week ago. He wanted to make sure there was no other significant issues and presents here.  Today, he reports some nasal congestion, cough, some URI symptoms and some abdominal discomfort but denies odynophagia, dysphagia, neck masses, ear pain, unintentional weight loss. He reports left sided throat discomfort has resolved but now feels some right sided discomfort.  ------------------------------------------- 10/04/2023: Returns for new complaints/excaerbation Reports some foreign body sensation on the left when he swallows - feels like it is a tonsil stone. Lasted little over a week. Not painful. Does not have reflux regularly but takes omeprazole  PRN. Does not get tonsil stones normally. No systemic symptoms; no URI symptoms Patient otherwise denies: - dysphagia, odynophagia, aspiration episodes or PNA, need for Heimlich, unintentional weight loss - changes in voice, shortness of breath, hemoptysis - slight ear discomfort, neck masses  In addition, he is having some burning sensation in right nostril (points to [facility] area) but no nasal issues otherwise except some congestion. No CRS issues, just some  congestion. ---------------------------------------------------------- Chart review shows he has seen multiple ENT providers for various complaints  Personal or FHx of bleeding dz or anesthesia difficulty: no   Tobacco: denies.  Independent Review of Additional Tests or Records:  Prior ENT notes reviewed including endoscopy notes PCP and ED notes reviewed and summarized above Bradley [name] (09/20/2023): noted right ear ache, Dx with AOM with effusion; Rx: augmentin  Rapid strep 07/31/2023: negative 03/26/2023 Dr. Georgina (ENT) - right nasal pain, intermittent mupirocin  ointment use; Dx: Nasal folliculitis, Rx: Mupirocin   PMH/Meds/All/SocHx/FamHx/ROS:   Past Medical History:  Diagnosis Date   Allergy to alpha-gal    Anxiety    Chronic headaches    Clavicular fracture    Diverticulosis    History of chickenpox    Mild intermittent asthma 06/07/2018   Shingles    28 years old.   Tubular adenoma of colon    Urticaria 05/18/2020     Past Surgical History:  Procedure Laterality Date   COLONOSCOPY     CYSTECTOMY  01/2019   scrotum area    ESOPHAGEAL MANOMETRY N/A 06/09/2020   Procedure: ESOPHAGEAL MANOMETRY (EM);  Surgeon: San Sandor GAILS, DO;  Location: [facility];  Service: Gastroenterology;  Laterality: N/A;   ESOPHAGOGASTRODUODENOSCOPY     shoulder surgery Right 08/26/2019   WISDOM TOOTH EXTRACTION      Family History  Problem Relation Age of Onset   Hypertension Mother    Colon polyps Mother        might be mistakrn   Diabetes Father        type II   Migraines Sister    Colon cancer Neg Hx    Esophageal cancer Neg Hx    Rectal cancer  Neg Hx    Stomach cancer Neg Hx      Social Connections: Unknown (02/06/2022)   Received from [facility]   Social Network    Social Network: Not on file      Current Outpatient Medications:    albuterol  (PROVENTIL ) (2.5 MG/3ML) 0.083% nebulizer solution, Take 3 mLs (2.5 mg total) by nebulization every 4  (four) hours as needed for wheezing or shortness of breath (coughing fits)., Disp: 75 mL, Rfl: 1   albuterol  (VENTOLIN  HFA) 108 (90 Base) MCG/ACT inhaler, INHALE 2 PUFFS INTO THE LUNGS EVERY 4 HOURS AS NEEDED FOR SHORTNESS OF BREATH OR WHEEZING, Disp: 8.5 g, Rfl: 1   BREO ELLIPTA  100-25 MCG/ACT AEPB, INHALE 1 PUFF INTO THE LUNGS DAILY. RINSE MOUTH AFTER USE, Disp: 60 each, Rfl: 3   citalopram  (CELEXA ) 20 MG tablet, TAKE 1 TABLET(20 MG) BY MOUTH DAILY, Disp: 90 tablet, Rfl: 0   clonazePAM  (KLONOPIN ) 0.5 MG tablet, TAKE 1 TABLET(0.5 MG) BY MOUTH TWICE DAILY AS NEEDED FOR ANXIETY, Disp: 30 tablet, Rfl: 0   cycloSPORINE (RESTASIS OP), Apply to eye., Disp: , Rfl:    EPINEPHrine  (AUVI-Q ) 0.3 mg/0.3 mL IJ SOAJ injection, Inject 0.3 mg into the muscle as needed for anaphylaxis., Disp: 2 each, Rfl: 1   fluticasone  (FLONASE ) 50 MCG/ACT nasal spray, Place 2 sprays into both nostrils in the morning and at bedtime., Disp: 16 g, Rfl: 6   fluticasone  furoate-vilanterol (BREO ELLIPTA ) 200-25 MCG/ACT AEPB, Inhale 1 puff into the lungs daily. Rinse your mouth after each use., Disp: 60 each, Rfl: 3   levocetirizine (XYZAL ) 5 MG tablet, Take 1 tablet (5 mg total) by mouth every evening., Disp: 30 tablet, Rfl: 5   omeprazole  (PRILOSEC) 40 MG capsule, Take by mouth., Disp: , Rfl:    sildenafil  (VIAGRA ) 100 MG tablet, TAKE 1/2 TO 1 TABLET(50 TO 100 MG) BY MOUTH DAILY AS NEEDED FOR ERECTILE DYSFUNCTION, Disp: 10 tablet, Rfl: 1   Physical Exam:   BP 132/89 (BP Location: Left Arm, Patient Position: Sitting, Cuff Size: Normal)   Pulse (!) 104   Ht 6' 6 (1.981 m)   Wt (!) 335 lb (152 kg)   SpO2 96%   BMI 38.71 kg/m   Salient findings:  CN II-XII intact  Bilateral EAC clear and TM intact with well pneumatized middle ear spaces Anterior rhinoscopy: Septum relatively midline; bilateral inferior turbinates with modest hypertrophy, mild mucosal edema; Nasal endoscopy was indicated to better evaluate the nose and paranasal  sinuses, given the patient's history and exam findings, and is detailed below. No lesions of oral cavity/oropharynx; no tonsil stones; dentition fair, no tonsil stones No obviously palpable neck masses/lymphadenopathy/thyromegaly No respiratory distress or stridor; voice quality class I --- given chief complaint, TFL (see below) was indicated for further management  Seprately Identifiable Procedures:  Procedure Note Pre-procedure diagnosis:  Throat pain, foreign body sensation in throat Post-procedure diagnosis: Same Procedure: Transnasal Fiberoptic Laryngoscopy, CPT 31575 - Mod 25 Indication: throat pain, foreign body sensation in throat Complications: None apparent EBL: 0 mL  The procedure was undertaken to further evaluate the patient's complaint of throat pain, foreign body sensation in throat, with mirror exam inadequate for appropriate examination due to gag reflex and poor patient tolerance  Procedure:  Patient was identified as correct patient. Verbal consent was obtained. The nose was sprayed with oxymetazoline and 4% lidocaine . The The flexible laryngoscope was passed through the nose to view the nasal cavity, pharynx (oropharynx, hypopharynx) and larynx.  The  larynx was examined at rest and during multiple phonatory tasks. Documentation was obtained and reviewed with patient. The scope was removed. The patient tolerated the procedure well.  Findings: The nasal cavity and nasopharynx did not reveal any masses or lesions, mucosa appeared to be without obvious lesions. The tongue base, pharyngeal walls, piriform sinuses, vallecula, epiglottis and postcricoid region are normal in appearance without foreign bodies. No foreign bodies, no purulence. The visualized portion of the subglottis and proximal trachea is widely patent. The vocal folds are mobile bilaterally. There are no lesions on the free edge of the vocal folds nor elsewhere in the larynx worrisome for malignancy.       Electronically signed by: Eldora KATHEE Blanch, MD 10/04/2023 12:30 PM  PROCEDURE: Bilateral Diagnostic Rigid Nasal Endoscopy Pre-procedure diagnosis: Concern for nasal lesion, nasal discomfort Post-procedure diagnosis: same Indication: See pre-procedure diagnosis and physical exam above Complications: None apparent EBL: 0 mL Anesthesia: Lidocaine  4% and topical decongestant was topically sprayed in each nasal cavity  Description of Procedure:  Patient was identified. A rigid 30 degree endoscope was utilized to evaluate the sinonasal cavities, mucosa, sinus ostia and turbinates and septum.  Overall, signs of mild mucosal edema and mucosal inflammation are noted. Some mucoid secretions.  No mucopurulence, polyps, or masses noted.   Right Middle meatus: clear Right SE Recess: clear Left MM: clear Left SE Recess: clear No nasal vestibular lesions   Photodocumentation was obtained.  CPT CODE -- 68768 - Mod 25    Impression & Plans:  Kahmari Koller is a 28 y.o. male with Foreign body sensation in throat and some discomfort Throat discomfort Right nasal discomfort Endoscopy is reassuring except for mild mucosal edema (but without purulence) and he is having some congestion. No nasal sill lesions. We discussed management of his symptoms, and he was reassured that there was no foreign body or masses. Maybe having a mild viral infection which is the cause of his symptoms. We discussed management and will manage conservatively to start - Start daily Rinses - Start Flonase  BID - Start omeprazole  40mg  daily   - f/u 2 weeks; if not improving, will escalate  See below regarding exact medications prescribed this encounter including dosages and route: Meds ordered this encounter  Medications   fluticasone  (FLONASE ) 50 MCG/ACT nasal spray    Sig: Place 2 sprays into both nostrils in the morning and at bedtime.    Dispense:  16 g    Refill:  6   Thank you for allowing me the opportunity to  care for your patient. Please do not hesitate to contact me should you have any other questions.  Sincerely, Eldora Blanch, MD Otolarynoglogist (ENT), [facility] Phone: [phone] Fax: [phone]  10/04/2023, 12:30 PM   MDM:  Level 4 - 99214 Complexity/Problems addressed: mod - multiple acute problems, now worsening Data complexity: low  - Morbidity: mod - Prescription Drug prescribed or managed: yes

## 2023-10-04 NOTE — Telephone Encounter (Signed)
 I'm going to call and speak to the pharmacy and obtain a refill history. They were currently closed. I am going to call and reach out again to see if there are any more refills.

## 2023-10-09 ENCOUNTER — Encounter: Payer: Self-pay | Admitting: Family Medicine

## 2023-10-09 ENCOUNTER — Ambulatory Visit: Payer: 59 | Admitting: Family Medicine

## 2023-10-09 VITALS — BP 142/84 | HR 85 | Temp 98.2°F | Ht 78.0 in | Wt 350.2 lb

## 2023-10-09 DIAGNOSIS — R1012 Left upper quadrant pain: Secondary | ICD-10-CM | POA: Diagnosis not present

## 2023-10-09 DIAGNOSIS — F419 Anxiety disorder, unspecified: Secondary | ICD-10-CM | POA: Diagnosis not present

## 2023-10-09 MED ORDER — CLONAZEPAM 0.5 MG PO TABS: 0.5000 mg | ORAL_TABLET | Freq: Two times a day (BID) | ORAL | 0 refills | Status: DC | PRN

## 2023-10-09 NOTE — Progress Notes (Signed)
 Established Patient Office Visit  Subjective   Patient ID: Bradley [name], male    DOB: [birth year]  Age: 28 y.o. MRN: [id number]  Chief Complaint  Patient presents with   Abdominal Pain    Follow-up     HPI   Bradley [name] is seen for follow-up regarding some left upper quadrant abdominal pain.  Was seen here on 7 January with pain really more left thoracic region.  Was felt to have more likely musculoskeletal pain.  Had had prior urgent care visit and urine dipstick showed no blood.  He then developed more acute illness couple days later with some coughing, chills, headache ended up going to urgent care for further evaluation.  He mentioned some left upper quadrant abdominal pain at that time.  He had lipase which was 10.  CBC normal.  CMP normal except for mildly elevated total bilirubin of 1.4.  Has had poor appetite and actually has lost 10 pounds since last visit here a week ago.  No vomiting.  No diarrhea or other stool changes.  Had upper and lower endoscopy 12/26/2021 which were unremarkable.  History of MRI abdomen and pelvis 4/23 with no acute findings.  Current pain does seem to be worse after eating.  Somewhat of a burning quality.  Also notices worsening left upper quadrant pain with cough or sneeze.  No fever.  He has longstanding history of chronic anxiety.  Had been taking clonazepam  fairly regularly and we recommended he try to taper off.  He is in process of tapering back.  Requesting 1 refill but hopes to be tapered off completely soon.  Has been taking Celexa  more consistently and anxiety symptoms have been stable.  Past Medical History:  Diagnosis Date   Allergy to alpha-gal    Anxiety    Chronic headaches    Clavicular fracture    Diverticulosis    History of chickenpox    Mild intermittent asthma 06/07/2018   Shingles    28 years old.   Tubular adenoma of colon    Urticaria 05/18/2020   Past Surgical History:  Procedure Laterality Date   COLONOSCOPY      CYSTECTOMY  01/2019   scrotum area    ESOPHAGEAL MANOMETRY N/A 06/09/2020   Procedure: ESOPHAGEAL MANOMETRY (EM);  Surgeon: San Sandor GAILS, DO;  Location: [facility];  Service: Gastroenterology;  Laterality: N/A;   ESOPHAGOGASTRODUODENOSCOPY     shoulder surgery Right 08/26/2019   WISDOM TOOTH EXTRACTION      reports that he has never smoked. He has never used smokeless tobacco. He reports current alcohol use of about 3.0 standard drinks of alcohol per week. He reports current drug use. Frequency: 2.00 times per week. Drug: Marijuana. family history includes Colon polyps in his mother; Diabetes in his father; Hypertension in his mother; Migraines in his sister. Allergies  Allergen Reactions   Shellfish Allergy Anaphylaxis   Hydrocodone Itching    Pt states he feels itchy after taking, but it doesn't preclude him taking hydrocodone if needed About 2-3 years ago.  It was the first time he took hydrocodone.   Other Diarrhea    Slightly allergic to red meat since tick bite.SABRASABRANot anaphylaxis, but GI sensitivity.      Review of Systems  Constitutional:  Positive for weight loss. Negative for chills and fever.  Respiratory:  Negative for cough.   Cardiovascular:  Negative for chest pain.  Gastrointestinal:  Positive for abdominal pain. Negative for blood in stool, diarrhea, nausea and vomiting.  Genitourinary:  Negative for dysuria and hematuria.      Objective:     BP (!) 142/84 (BP Location: Right Arm, Patient Position: Sitting, Cuff Size: Large)   Pulse 85   Temp 98.2 F (36.8 C) (Oral)   Ht 6' 6 (1.981 m)   Wt (!) 350 lb 3.2 oz (158.8 kg)   SpO2 97%   BMI 40.47 kg/m  BP Readings from Last 3 Encounters:  10/09/23 (!) 142/84  10/04/23 132/89  10/02/23 126/76   Wt Readings from Last 3 Encounters:  10/09/23 (!) 350 lb 3.2 oz (158.8 kg)  10/04/23 (!) 335 lb (152 kg)  10/02/23 (!) 360 lb 11.2 oz (163.6 kg)      Physical Exam Vitals reviewed.  Constitutional:       General: He is not in acute distress.    Appearance: He is not ill-appearing.  Cardiovascular:     Rate and Rhythm: Normal rate and regular rhythm.  Pulmonary:     Effort: Pulmonary effort is normal.     Breath sounds: No wheezing or rales.  Abdominal:     Comments: No abdominal distention.  Normal bowel sounds.  Soft with mild to moderate tenderness left upper quadrant.  No splenomegaly noted.  No masses palpated.  No guarding or rebound.  Neurological:     Mental Status: He is alert.      No results found for any visits on 10/09/23.    The ASCVD Risk score (Arnett DK, et al., 2019) failed to calculate for the following reasons:   The 2019 ASCVD risk score is only valid for ages 28 to 3    Assessment & Plan:   #1 patient presents with left lower quadrant abdominal pain.  Recent labs for urgent care including CBC, lipase, CMP unremarkable.  Etiology unclear.  He has had documented 10 pound weight loss with poor appetite since last visit.  Nonacute abdomen.  We elected to get ultrasound to further assess.  Follow-up immediately for any fever or worsening pain  #2 history of chronic anxiety.  Currently on Celexa  20 mg daily.  We have discussed previously trying to avoid regular use of benzos such as clonazepam  and he is currently in process of tapering off.  1 refill given   No follow-ups on file.    Wolm Scarlet, MD

## 2023-10-10 ENCOUNTER — Ambulatory Visit (INDEPENDENT_AMBULATORY_CARE_PROVIDER_SITE_OTHER): Payer: 59

## 2023-10-11 ENCOUNTER — Encounter: Payer: Self-pay | Admitting: Family Medicine

## 2023-10-11 ENCOUNTER — Ambulatory Visit (HOSPITAL_BASED_OUTPATIENT_CLINIC_OR_DEPARTMENT_OTHER)
Admission: RE | Admit: 2023-10-11 | Discharge: 2023-10-11 | Disposition: A | Discharge status: Home / Self Care | Payer: 59 | Source: Ambulatory Visit | Attending: Family Medicine | Admitting: Family Medicine

## 2023-10-11 DIAGNOSIS — R1012 Left upper quadrant pain: Secondary | ICD-10-CM | POA: Insufficient documentation

## 2023-10-12 ENCOUNTER — Ambulatory Visit (HOSPITAL_BASED_OUTPATIENT_CLINIC_OR_DEPARTMENT_OTHER): Payer: 59

## 2023-10-15 NOTE — Telephone Encounter (Signed)
Please see result note 

## 2023-10-16 ENCOUNTER — Encounter: Payer: Self-pay | Admitting: Allergy

## 2023-10-16 DIAGNOSIS — Z91018 Allergy to other foods: Secondary | ICD-10-CM

## 2023-10-16 DIAGNOSIS — T781XXD Other adverse food reactions, not elsewhere classified, subsequent encounter: Secondary | ICD-10-CM

## 2023-10-17 NOTE — Addendum Note (Signed)
Addended by: Ellamae Sia on: 10/17/2023 10:57 AM   Modules accepted: Orders

## 2023-10-17 NOTE — Telephone Encounter (Signed)
Printed labs and will give to Vernona Rieger in OR to have at the front desk for patient to pick up.

## 2023-10-17 NOTE — Telephone Encounter (Signed)
Please mail lab order for alpha-gal to patient.

## 2023-10-19 ENCOUNTER — Ambulatory Visit (INDEPENDENT_AMBULATORY_CARE_PROVIDER_SITE_OTHER): Payer: 59 | Admitting: Family Medicine

## 2023-10-19 ENCOUNTER — Ambulatory Visit: Payer: 59 | Admitting: Family Medicine

## 2023-10-19 ENCOUNTER — Encounter: Payer: Self-pay | Admitting: Family Medicine

## 2023-10-19 VITALS — BP 114/70 | HR 70 | Temp 98.3°F | Wt 363.9 lb

## 2023-10-19 DIAGNOSIS — R1012 Left upper quadrant pain: Secondary | ICD-10-CM | POA: Diagnosis not present

## 2023-10-19 DIAGNOSIS — L720 Epidermal cyst: Secondary | ICD-10-CM

## 2023-10-19 NOTE — Progress Notes (Signed)
Established Patient Office Visit  Subjective   Patient ID: Bradley [name], male    DOB: [birth year]  Age: 28 y.o. MRN: [id number]  Chief Complaint  Patient presents with   Medical Management of Chronic Issues    HPI   Bradley [name] is seen regarding persistent left-sided pain left upper quadrant region.  Ultrasound revealed no significant abnormalities.  He had comment of contracted gallbladder.  Previous HIDA scan normal.  No recent right upper quadrant pain.  Denies any fevers or chills.  Appetite and weight stable.  Has had previous endoscopy which was unrevealing.  Has had some recent constipation.  Has bowel movements most days but sometimes has small caliber bowel movements and occasional straining.  Used milk of magnesia with some success  Cystic lesion right upper back slightly tender recently.  Past Medical History:  Diagnosis Date   Allergy to alpha-gal    Anxiety    Chronic headaches    Clavicular fracture    Diverticulosis    History of chickenpox    Mild intermittent asthma 06/07/2018   Shingles    28 years old.   Tubular adenoma of colon    Urticaria 05/18/2020   Past Surgical History:  Procedure Laterality Date   COLONOSCOPY     CYSTECTOMY  01/2019   scrotum area    ESOPHAGEAL MANOMETRY N/A 06/09/2020   Procedure: ESOPHAGEAL MANOMETRY (EM);  Surgeon: Shellia Cleverly, DO;  Location: [facility];  Service: Gastroenterology;  Laterality: N/A;   ESOPHAGOGASTRODUODENOSCOPY     shoulder surgery Right 08/26/2019   WISDOM TOOTH EXTRACTION      reports that he has never smoked. He has never used smokeless tobacco. He reports current alcohol use of about 3.0 standard drinks of alcohol per week. He reports current drug use. Frequency: 2.00 times per week. Drug: Marijuana. family history includes Colon polyps in his mother; Diabetes in his father; Hypertension in his mother; Migraines in his sister. Allergies  Allergen Reactions   Shellfish Allergy Anaphylaxis    Hydrocodone Itching    Pt states he feels itchy after taking, but it doesn't preclude him taking hydrocodone if needed About 2-3 years ago.  It was the first time he took hydrocodone.   Other Diarrhea    Slightly allergic to red meat since tick bite.Marland KitchenMarland KitchenNot anaphylaxis, but GI sensitivity.    Review of Systems  Constitutional:  Negative for chills and fever.  Cardiovascular:  Negative for chest pain.  Gastrointestinal:  Positive for abdominal pain and constipation. Negative for blood in stool, diarrhea, melena, nausea and vomiting.      Objective:     BP 114/70 (BP Location: Left Arm, Patient Position: Sitting, Cuff Size: Large)   Pulse 70   Temp 98.3 F (36.8 C) (Oral)   Wt (!) 363 lb 14.4 oz (165.1 kg)   SpO2 98%   BMI 42.05 kg/m  BP Readings from Last 3 Encounters:  10/19/23 114/70  10/09/23 (!) 142/84  10/04/23 132/89   Wt Readings from Last 3 Encounters:  10/19/23 (!) 363 lb 14.4 oz (165.1 kg)  10/09/23 (!) 350 lb 3.2 oz (158.8 kg)  10/04/23 (!) 335 lb (152 kg)      Physical Exam Vitals reviewed.  Constitutional:      General: He is not in acute distress. Cardiovascular:     Rate and Rhythm: Normal rate and regular rhythm.  Pulmonary:     Effort: Pulmonary effort is normal.     Breath sounds: Normal breath sounds.  Skin:  Comments: Upper back trapezius region approximately 1/2 cm subcutaneous epidermal cyst.  Nonfluctuant.  Minimal overlying erythema.  Neurological:     Mental Status: He is alert.      No results found for any visits on 10/19/23.    The ASCVD Risk score (Arnett DK, et al., 2019) failed to calculate for the following reasons:   The 2019 ASCVD risk score is only valid for ages 51 to 59    Assessment & Plan:   #1 persistent left upper quadrant abdominal pain with recent unremarkable ultrasound.  Etiology unclear.  He is had fairly complete workup previously.  Does have some intermittent constipation and not clear if this is related.   We did discuss measures to reduce constipation with at least 30 g of fiber today, increase fluid intake, regular exercise, MiraLAX as needed  #2 minimally inflamed epidermal cyst right upper back.  No signs of full abscess.  Watch closely for any increased erythema, swelling, or fluctuance.  Evelena Peat, MD

## 2023-10-22 LAB — ALPHA-GAL PANEL
Allergen Lamb IgE: 4.21 kU/L — AB
Beef IgE: 4.76 kU/L — AB
IgE (Immunoglobulin E), Serum: 277 [IU]/mL (ref 6–495)
O215-IgE Alpha-Gal: 5.9 kU/L — AB
Pork IgE: 3.05 kU/L — AB

## 2023-10-22 LAB — ALLERGEN PROFILE, SHELLFISH
Clam IgE: 0.15 kU/L — AB
F023-IgE Crab: <0.1 kU/L
F080-IgE Lobster: <0.1 kU/L
F290-IgE Oyster: <0.1 kU/L
Scallop IgE: 0.18 kU/L — AB
Shrimp IgE: 0.26 kU/L — AB

## 2023-10-23 ENCOUNTER — Encounter: Payer: Self-pay | Admitting: Allergy

## 2023-10-24 ENCOUNTER — Telehealth (INDEPENDENT_AMBULATORY_CARE_PROVIDER_SITE_OTHER): Payer: Self-pay | Admitting: Otolaryngology

## 2023-10-24 NOTE — Telephone Encounter (Signed)
Reminder Call: Date: 10/25/2023 Status: Sch  Time: 10:45 AM [address] [place], [place] [address]  Confirmed time and location w/patient.

## 2023-10-25 ENCOUNTER — Encounter (INDEPENDENT_AMBULATORY_CARE_PROVIDER_SITE_OTHER): Payer: Self-pay

## 2023-10-25 ENCOUNTER — Ambulatory Visit (INDEPENDENT_AMBULATORY_CARE_PROVIDER_SITE_OTHER): Payer: 59 | Admitting: Otolaryngology

## 2023-10-25 VITALS — BP 116/75 | HR 89 | Resp 18 | Ht 78.0 in | Wt 363.0 lb

## 2023-10-25 DIAGNOSIS — R09A2 Foreign body sensation, throat: Secondary | ICD-10-CM

## 2023-10-25 DIAGNOSIS — R0981 Nasal congestion: Secondary | ICD-10-CM

## 2023-10-25 DIAGNOSIS — R070 Pain in throat: Secondary | ICD-10-CM

## 2023-10-25 NOTE — Progress Notes (Signed)
Dear Dr. Caryl Never, Here is my assessment for our mutual patient, Bradley [name]. Thank you for allowing me the opportunity to care for your patient. Please do not hesitate to contact me should you have any other questions. Sincerely, Dr. Jovita Kussmaul  Otolaryngology Clinic Note Referring provider: Dr. Caryl Never HPI:  Bradley [name] is a 28 y.o. male kindly referred by Dr. Caryl Never for evaluation of sore throat  Initial visit (07/2023): He reports that he first had some left sided throat discomfort and FB sensation in his throat starting about a month ago - after he had seafood. He was prescribed augmentin after being evaluted in the ED, and saw [name] and had a scope, which was reassuring. He then felt a bit better, and then came down with URI symptoms about a week ago. He wanted to make sure there was no other significant issues and presents here.  Today, he reports some nasal congestion, cough, some URI symptoms and some abdominal discomfort but denies odynophagia, dysphagia, neck masses, ear pain, unintentional weight loss. He reports left sided throat discomfort has resolved but now feels some right sided discomfort.  ------------------------------------------- 10/04/2023: Returns for new complaints/excaerbation Reports some foreign body sensation on the left when he swallows - feels like it is a tonsil stone. Lasted little over a week. Not painful. Does not have reflux regularly but takes omeprazole PRN. Does not get tonsil stones normally. No systemic symptoms; no URI symptoms Patient otherwise denies: - dysphagia, odynophagia, aspiration episodes or PNA, need for Heimlich, unintentional weight loss - changes in voice, shortness of breath, hemoptysis - slight ear discomfort, neck masses  In addition, he is having some burning sensation in right nostril (points to Bradley [name] area) but no nasal issues otherwise except some congestion. No CRS issues, just some  congestion. --------------------------------------------------------- 10/25/2023 Seen in follow up. He reports that he is doing ok, no pain, just some globus. No dysphagia, odynophagia, ear pain, neck masses. No nasal issues. Nasal discomfort resolved.  ---------------------------------------------------------- Chart review shows he has seen multiple ENT providers for various complaints  Personal or FHx of bleeding dz or anesthesia difficulty: no   Tobacco: denies.  Independent Review of Additional Tests or Records:  Prior ENT notes reviewed including endoscopy notes PCP and ED notes reviewed and summarized above Bradley [name] (09/20/2023): noted right ear ache, Dx with AOM with effusion; Rx: augmentin Rapid strep 07/31/2023: negative 03/26/2023 Dr. Christell Constant (ENT) - right nasal pain, intermittent mupirocin ointment use; Dx: Nasal folliculitis, Rx: Mupirocin  PMH/Meds/All/SocHx/FamHx/ROS:   Past Medical History:  Diagnosis Date   Allergy to alpha-gal    Anxiety    Chronic headaches    Clavicular fracture    Diverticulosis    History of chickenpox    Mild intermittent asthma 06/07/2018   Shingles    28 years old.   Tubular adenoma of colon    Urticaria 05/18/2020     Past Surgical History:  Procedure Laterality Date   COLONOSCOPY     CYSTECTOMY  01/2019   scrotum area    ESOPHAGEAL MANOMETRY N/A 06/09/2020   Procedure: ESOPHAGEAL MANOMETRY (EM);  Surgeon: Shellia Cleverly, DO;  Location: [facility];  Service: Gastroenterology;  Laterality: N/A;   ESOPHAGOGASTRODUODENOSCOPY     shoulder surgery Right 08/26/2019   WISDOM TOOTH EXTRACTION      Family History  Problem Relation Age of Onset   Hypertension Mother    Colon polyps Mother        might be mistakrn   Diabetes Father  type II   Migraines Sister    Colon cancer Neg Hx    Esophageal cancer Neg Hx    Rectal cancer Neg Hx    Stomach cancer Neg Hx      Social Connections: Unknown (02/06/2022)   Received  from [facility]   Social Network    Social Network: Not on file      Current Outpatient Medications:    albuterol (PROVENTIL) (2.5 MG/3ML) 0.083% nebulizer solution, Take 3 mLs (2.5 mg total) by nebulization every 4 (four) hours as needed for wheezing or shortness of breath (coughing fits)., Disp: 75 mL, Rfl: 1   BREO ELLIPTA 100-25 MCG/ACT AEPB, INHALE 1 PUFF INTO THE LUNGS DAILY. RINSE MOUTH AFTER USE, Disp: 60 each, Rfl: 3   citalopram (CELEXA) 20 MG tablet, TAKE 1 TABLET(20 MG) BY MOUTH DAILY, Disp: 90 tablet, Rfl: 0   clonazePAM (KLONOPIN) 0.5 MG tablet, Take 1 tablet (0.5 mg total) by mouth 2 (two) times daily as needed for anxiety., Disp: 30 tablet, Rfl: 0   cycloSPORINE (RESTASIS OP), Apply to eye., Disp: , Rfl:    EPINEPHrine (AUVI-Q) 0.3 mg/0.3 mL IJ SOAJ injection, Inject 0.3 mg into the muscle as needed for anaphylaxis., Disp: 2 each, Rfl: 1   fluticasone (FLONASE) 50 MCG/ACT nasal spray, Place 2 sprays into both nostrils in the morning and at bedtime., Disp: 16 g, Rfl: 6   fluticasone furoate-vilanterol (BREO ELLIPTA) 200-25 MCG/ACT AEPB, Inhale 1 puff into the lungs daily. Rinse your mouth after each use., Disp: 60 each, Rfl: 3   levocetirizine (XYZAL) 5 MG tablet, Take 1 tablet (5 mg total) by mouth every evening., Disp: 30 tablet, Rfl: 5   omeprazole (PRILOSEC) 40 MG capsule, Take by mouth., Disp: , Rfl:    sildenafil (VIAGRA) 100 MG tablet, TAKE 1/2 TO 1 TABLET(50 TO 100 MG) BY MOUTH DAILY AS NEEDED FOR ERECTILE DYSFUNCTION, Disp: 10 tablet, Rfl: 1   Physical Exam:   BP 116/75 (BP Location: Right Arm, Patient Position: Sitting, Cuff Size: Large)   Pulse 89   Resp 18   Ht 6\' 6"  (1.981 m)   Wt (!) 363 lb (164.7 kg)   SpO2 98%   BMI 41.95 kg/m   Salient findings:  CN II-XII intact  Bilateral EAC clear and TM intact with well pneumatized middle ear spaces Anterior rhinoscopy: Septum relatively midline; bilateral inferior turbinates with modest  hypertrophy, no significant mucosal edema. No lesions of oral cavity/oropharynx; no tonsil stones; dentition fair No obviously palpable neck masses/lymphadenopathy/thyromegaly No respiratory distress or stridor; voice quality class I  Seprately Identifiable Procedures:  Procedure Note (Prior, not today):  The procedure was undertaken to further evaluate the patient's complaint of throat pain, foreign body sensation in throat, with mirror exam inadequate for appropriate examination due to gag reflex and poor patient tolerance  Procedure:  Patient was identified as correct patient. Verbal consent was obtained. The nose was sprayed with oxymetazoline and 4% lidocaine. The The flexible laryngoscope was passed through the nose to view the nasal cavity, pharynx (oropharynx, hypopharynx) and larynx.  The larynx was examined at rest and during multiple phonatory tasks. Documentation was obtained and reviewed with patient. The scope was removed. The patient tolerated the procedure well.  Findings: The nasal cavity and nasopharynx did not reveal any masses or lesions, mucosa appeared to be without obvious lesions. The tongue base, pharyngeal walls, piriform sinuses, vallecula, epiglottis and postcricoid region are normal in appearance without foreign bodies. No foreign bodies,  no purulence. The visualized portion of the subglottis and proximal trachea is widely patent. The vocal folds are mobile bilaterally. There are no lesions on the free edge of the vocal folds nor elsewhere in the larynx worrisome for malignancy.      Electronically signed by: Read Drivers, MD 10/25/2023 12:47 PM  PROCEDURE: Bilateral Diagnostic Rigid Nasal Endoscopy Prior, not today  Description of Procedure:  Patient was identified. A rigid 30 degree endoscope was utilized to evaluate the sinonasal cavities, mucosa, sinus ostia and turbinates and septum.  Overall, signs of mild mucosal edema and mucosal inflammation are noted.  Some mucoid secretions.  No mucopurulence, polyps, or masses noted.   Right Middle meatus: clear Right SE Recess: clear Left MM: clear Left SE Recess: clear No nasal vestibular lesions   Photodocumentation was obtained.  CPT CODE -- 16109 - Mod 25    Impression & Plans:  Bradley [name] is a 28 y.o. male with Foreign body sensation in throat and some discomfort Throat discomfort - resolved Right nasal discomfort - resolved Prior endo reassuring, discomfort resolved. Just some FB sensation. No other symptoms. Given improvement, did not scope. - Continue daily Rinses and flonase PRN  - f/u PRN; return precautions discussed  See below regarding exact medications prescribed this encounter including dosages and route: No orders of the defined types were placed in this encounter.  Thank you for allowing me the opportunity to care for your patient. Please do not hesitate to contact me should you have any other questions.  Sincerely, Jovita Kussmaul, MD Otolarynoglogist (ENT), [facility] Phone: [phone] Fax: [phone]  10/25/2023, 12:47 PM   MDM:  Level 3 - 99213 Complexity/Problems addressed: low - improving problems Data complexity: low  - Morbidity: low - Prescription Drug prescribed or managed: no

## 2023-11-06 ENCOUNTER — Encounter: Payer: Self-pay | Admitting: Family Medicine

## 2023-11-08 DIAGNOSIS — R09A1 Foreign body sensation, nose: Secondary | ICD-10-CM | POA: Insufficient documentation

## 2023-11-12 ENCOUNTER — Other Ambulatory Visit: Payer: Self-pay | Admitting: Family Medicine

## 2023-11-23 ENCOUNTER — Encounter: Payer: Self-pay | Admitting: Family Medicine

## 2023-11-23 ENCOUNTER — Ambulatory Visit: Payer: 59 | Admitting: Family Medicine

## 2023-11-23 VITALS — BP 106/70 | HR 73 | Temp 98.5°F | Wt 357.6 lb

## 2023-11-23 DIAGNOSIS — T148XXA Other injury of unspecified body region, initial encounter: Secondary | ICD-10-CM

## 2023-11-23 DIAGNOSIS — R0789 Other chest pain: Secondary | ICD-10-CM

## 2023-11-23 DIAGNOSIS — S70311A Abrasion, right thigh, initial encounter: Secondary | ICD-10-CM | POA: Diagnosis not present

## 2023-11-23 NOTE — Progress Notes (Signed)
 Established Patient Office Visit  Subjective   Patient ID: Bradley [name], male    DOB: 08-30-1996  Age: 28 y.o. MRN: [id number]  Chief Complaint  Patient presents with   Medical Management of Chronic Issues    HPI   Bradley [name] is seen today for the following issues.  He has had some chronic left chest wall pain/left upper abdominal pain intermittently for quite some time.  Recent ultrasound revealed no abnormalities.  He denies any appetite change, weight loss, fever, skin rash.  Denies any recent specific injury.  Currently pain is actually doing better compared with several weeks ago.  We suspected musculoskeletal etiology.  No pleuritic pain.  No chronic cough  Second issue is possible small cyst right upper inner thigh.  He scratched off the surface and currently superficial wound.  Had a bit of clear drainage.  No purulence.  No surrounding redness.  Past Medical History:  Diagnosis Date   Allergy to alpha-gal    Anxiety    Chronic headaches    Clavicular fracture    Diverticulosis    History of chickenpox    Mild intermittent asthma 06/07/2018   Shingles    28 years old.   Tubular adenoma of colon    Urticaria 05/18/2020   Past Surgical History:  Procedure Laterality Date   COLONOSCOPY     CYSTECTOMY  01/2019   scrotum area    ESOPHAGEAL MANOMETRY N/A 06/09/2020   Procedure: ESOPHAGEAL MANOMETRY (EM);  Surgeon: Shellia Cleverly, DO;  Location: [facility];  Service: Gastroenterology;  Laterality: N/A;   ESOPHAGOGASTRODUODENOSCOPY     shoulder surgery Right 08/26/2019   WISDOM TOOTH EXTRACTION      reports that he has never smoked. He has never used smokeless tobacco. He reports current alcohol use of about 3.0 standard drinks of alcohol per week. He reports current drug use. Frequency: 2.00 times per week. Drug: Marijuana. family history includes Colon polyps in his mother; Diabetes in his father; Hypertension in his mother; Migraines in his sister. Allergies   Allergen Reactions   Shellfish Allergy Anaphylaxis   Hydrocodone Itching    Pt states he feels itchy after taking, but it doesn't preclude him taking hydrocodone if needed About 2-3 years ago.  It was the first time he took hydrocodone.   Other Diarrhea    Slightly allergic to red meat since tick bite.Marland KitchenMarland KitchenNot anaphylaxis, but GI sensitivity.    Review of Systems  Constitutional:  Negative for chills and fever.  Respiratory:  Negative for cough.   Cardiovascular:  Negative for chest pain.      Objective:     BP 106/70 (BP Location: Left Arm, Patient Position: Sitting, Cuff Size: Large)   Pulse 73   Temp 98.5 F (36.9 C) (Oral)   Wt (!) 357 lb 9.6 oz (162.2 kg)   SpO2 96%   BMI 41.32 kg/m  BP Readings from Last 3 Encounters:  11/23/23 106/70  10/25/23 116/75  10/19/23 114/70   Wt Readings from Last 3 Encounters:  11/23/23 (!) 357 lb 9.6 oz (162.2 kg)  10/25/23 (!) 363 lb (164.7 kg)  10/19/23 (!) 363 lb 14.4 oz (165.1 kg)      Physical Exam Vitals reviewed.  Constitutional:      General: He is not in acute distress.    Appearance: He is not ill-appearing.  HENT:     Right Ear: Tympanic membrane and ear canal normal.     Left Ear: Tympanic membrane and ear canal  normal.  Cardiovascular:     Rate and Rhythm: Normal rate and regular rhythm.  Pulmonary:     Effort: Pulmonary effort is normal.     Breath sounds: Normal breath sounds. No wheezing or rales.  Skin:    Comments: Right upper inner thigh reveals very small approximately 3 x 3 mm area of abraded skin.  No cellulitis changes.  No pustules.  Nonfluctuant.  Neurological:     Mental Status: He is alert.      No results found for any visits on 11/23/23.    The ASCVD Risk score (Arnett DK, et al., 2019) failed to calculate for the following reasons:   The 2019 ASCVD risk score is only valid for ages 39 to 54    Assessment & Plan:   #1 chronic intermittent left chest wall pain.  Suspect  musculoskeletal.  Currently improved.  If flares again consider sports medicine referral  #2 abrasion to skin right upper inner thigh.  No signs of secondary infection.  Keep clean with soap and water.  Follow-up promptly for any signs of secondary infection   No follow-ups on file.    Evelena Peat, MD

## 2023-11-27 ENCOUNTER — Encounter: Payer: 59 | Admitting: Family Medicine

## 2023-12-03 ENCOUNTER — Encounter: Payer: Self-pay | Admitting: Family Medicine

## 2023-12-04 ENCOUNTER — Ambulatory Visit (INDEPENDENT_AMBULATORY_CARE_PROVIDER_SITE_OTHER): Admitting: Family Medicine

## 2023-12-04 ENCOUNTER — Encounter: Payer: Self-pay | Admitting: Family Medicine

## 2023-12-04 VITALS — BP 102/72 | HR 73 | Temp 98.2°F | Ht 78.0 in | Wt 359.1 lb

## 2023-12-04 DIAGNOSIS — Z1159 Encounter for screening for other viral diseases: Secondary | ICD-10-CM | POA: Diagnosis not present

## 2023-12-04 DIAGNOSIS — Z0000 Encounter for general adult medical examination without abnormal findings: Secondary | ICD-10-CM

## 2023-12-04 LAB — BASIC METABOLIC PANEL
BUN: 12 mg/dL (ref 6–23)
CO2: 27 meq/L (ref 19–32)
Calcium: 9.5 mg/dL (ref 8.4–10.5)
Chloride: 105 meq/L (ref 96–112)
Creatinine, Ser: 0.78 mg/dL (ref 0.40–1.50)
GFR: 121.42 mL/min (ref >60.00)
Glucose, Bld: 102 mg/dL — ABNORMAL HIGH (ref 70–99)
Potassium: 4 meq/L (ref 3.5–5.1)
Sodium: 139 meq/L (ref 135–145)

## 2023-12-04 LAB — TSH: TSH: 1.35 u[IU]/mL (ref 0.35–5.50)

## 2023-12-04 LAB — LIPID PANEL
Cholesterol: 131 mg/dL (ref 0–200)
HDL: 36 mg/dL — ABNORMAL LOW (ref >39.00)
LDL Cholesterol: 82 mg/dL (ref 0–99)
NonHDL: 95.34
Total CHOL/HDL Ratio: 4
Triglycerides: 67 mg/dL (ref 0.0–149.0)
VLDL: 13.4 mg/dL (ref 0.0–40.0)

## 2023-12-04 LAB — HEMOGLOBIN A1C: Hgb A1c MFr Bld: 5.2 % (ref 4.6–6.5)

## 2023-12-04 NOTE — Progress Notes (Signed)
 Established Patient Office Visit  Subjective   Patient ID: Bradley [name], male    DOB: November 06, 1995  Age: 28 y.o. MRN: [id number]  Chief Complaint  Patient presents with   Annual Exam    HPI   Bradley [name] seen for physical exam.  He has history of migraines without aura, mild persistent asthma, obesity.  He has some chronic ongoing left upper quadrant abdominal pains.  Ultrasound unrevealing.  He has seen GI and has had previous colonoscopy.  Current pain is increased to palpation and sometimes worse with movement.  Suspected musculoskeletal.  He is generally doing well.  He has recently made some dietary changes and trying to scale back overall sugar and calories.  Health maintenance reviewed:  Health Maintenance  Topic Date Due   Hepatitis C Screening  Never done   HPV VACCINES (2 - Male 3-dose series) 07/12/2018   COVID-19 Vaccine (3 - 2024-25 season) 12/19/2023 (Originally 05/27/2023)   INFLUENZA VACCINE  12/24/2023 (Originally 04/26/2023)   Pneumococcal Vaccine 19-35 Years old (1 of 2 - PCV) 12/03/2024 (Originally 06/26/2001)   Colonoscopy  12/27/2026   DTaP/Tdap/Td (7 - Td or Tdap) 11/13/2027   HIV Screening  Completed   -No history of prior hepatitis C screening but low risk. -We discussed possible pneumonia vaccination given his asthma status that he declines at this time. -Tetanus due 2029  Family history-mom has history of hypertension and father diabetes.  Maternal grandfather had colon cancer.  Sister has PCOS and question of celiac disease  Social history-he is single.  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  Works in [employer].  Non-smoker.  Rare alcohol.  Past Medical History:  Diagnosis Date   Allergy to alpha-gal    Anxiety    Chronic headaches    Clavicular fracture    Diverticulosis    History of chickenpox    Mild intermittent asthma 06/07/2018   Shingles    28 years old.   Tubular adenoma of colon    Urticaria 05/18/2020   Past Surgical  History:  Procedure Laterality Date   COLONOSCOPY     CYSTECTOMY  01/2019   scrotum area    ESOPHAGEAL MANOMETRY N/A 06/09/2020   Procedure: ESOPHAGEAL MANOMETRY (EM);  Surgeon: Shellia Cleverly, DO;  Location: [facility];  Service: Gastroenterology;  Laterality: N/A;   ESOPHAGOGASTRODUODENOSCOPY     shoulder surgery Right 08/26/2019   WISDOM TOOTH EXTRACTION      reports that he has never smoked. He has never used smokeless tobacco. He reports current alcohol use of about 3.0 standard drinks of alcohol per week. He reports current drug use. Frequency: 2.00 times per week. Drug: Marijuana. family history includes Colon polyps in his mother; Diabetes in his father; Hypertension in his mother; Migraines in his sister. Allergies  Allergen Reactions   Shellfish Allergy Anaphylaxis   Hydrocodone Itching    Pt states he feels itchy after taking, but it doesn't preclude him taking hydrocodone if needed About 2-3 years ago.  It was the first time he took hydrocodone.   Other Diarrhea    Slightly allergic to red meat since tick bite.Marland KitchenMarland KitchenNot anaphylaxis, but GI sensitivity.    Review of Systems  Constitutional:  Negative for chills, fever, malaise/fatigue and weight loss.  HENT:  Negative for hearing loss.   Eyes:  Negative for blurred vision and double vision.  Respiratory:  Negative for cough and shortness of breath.   Cardiovascular:  Negative for chest pain, palpitations and leg swelling.  Gastrointestinal:  Negative for blood in stool, constipation and diarrhea.  Genitourinary:  Negative for dysuria.  Skin:  Negative for rash.  Neurological:  Negative for dizziness, speech change, seizures, loss of consciousness and headaches.  Psychiatric/Behavioral:  Negative for depression.       Objective:     BP 102/72 (BP Location: Left Arm, Patient Position: Sitting, Cuff Size: Large)   Pulse 73   Temp 98.2 F (36.8 C) (Oral)   Ht 6\' 6"  (1.981 m)   Wt (!) 359 lb 1.6 oz (162.9 kg)    SpO2 97%   BMI 41.50 kg/m  BP Readings from Last 3 Encounters:  12/04/23 102/72  11/23/23 106/70  10/25/23 116/75   Wt Readings from Last 3 Encounters:  12/04/23 (!) 359 lb 1.6 oz (162.9 kg)  11/23/23 (!) 357 lb 9.6 oz (162.2 kg)  10/25/23 (!) 363 lb (164.7 kg)      Physical Exam Vitals reviewed.  Constitutional:      General: He is not in acute distress.    Appearance: He is well-developed.  HENT:     Head: Normocephalic and atraumatic.  Eyes:     Conjunctiva/sclera: Conjunctivae normal.     Pupils: Pupils are equal, round, and reactive to light.  Neck:     Thyroid: No thyromegaly.  Cardiovascular:     Rate and Rhythm: Normal rate and regular rhythm.     Heart sounds: Normal heart sounds. No murmur heard. Pulmonary:     Effort: No respiratory distress.     Breath sounds: No wheezing or rales.  Abdominal:     General: Bowel sounds are normal. There is no distension.     Palpations: Abdomen is soft. There is no mass.     Tenderness: There is no guarding.  Musculoskeletal:     Cervical back: Normal range of motion and neck supple.  Lymphadenopathy:     Cervical: No cervical adenopathy.  Skin:    Findings: No rash.  Neurological:     Mental Status: He is alert and oriented to person, place, and time.     Cranial Nerves: No cranial nerve deficit.      No results found for any visits on 12/04/23.  Last CBC Lab Results  Component Value Date   WBC 6.5 08/14/2022   HGB 14.6 08/14/2022   HCT 43.6 08/14/2022   MCV 89.1 08/14/2022   MCH 29.7 12/30/2021   RDW 12.6 08/14/2022   PLT 236.0 08/14/2022   Last metabolic panel Lab Results  Component Value Date   GLUCOSE 80 08/14/2022   NA 138 08/14/2022   K 3.9 08/14/2022   CL 107 08/14/2022   CO2 23 08/14/2022   BUN 9 08/14/2022   CREATININE 0.84 08/14/2022   GFR 119.83 08/14/2022   CALCIUM 8.7 08/14/2022   PROT 6.9 08/14/2022   ALBUMIN 4.4 08/14/2022   LABGLOB 2.1 05/20/2020   AGRATIO 2.2 05/20/2020    BILITOT 0.5 08/14/2022   ALKPHOS 57 08/14/2022   AST 24 08/14/2022   ALT 27 08/14/2022   ANIONGAP 11 10/16/2019      The ASCVD Risk score (Arnett DK, et al., 2019) failed to calculate for the following reasons:   The 2019 ASCVD risk score is only valid for ages 23 to 71    Assessment & Plan:   Problem List Items Addressed This Visit   None Visit Diagnoses       Well adult exam    -  Primary   Relevant Orders  Hemoglobin A1c   Lipid panel   TSH   Basic metabolic panel     Encounter for hepatitis C screening test for low risk patient       Relevant Orders   Hep C Antibody     28 year old male with medical issues as above.  We discussed the following health maintenance items  -Recommend hepatitis C screen though he is low risk -Discussed consideration for pneumonia vaccine but he declines at this time -Obtain follow-up labs as above.  He had recent CBC in January elsewhere so this was not obtained.  Also had comprehensive metabolic panel done in January. -We have encouraged him to continue weight loss efforts with exercise and overall calorie reduction -Checking A1c with family history of type 2 diabetes  No follow-ups on file.    Evelena Peat, MD

## 2023-12-05 LAB — HEPATITIS C ANTIBODY: Hepatitis C Ab: NONREACTIVE

## 2023-12-21 ENCOUNTER — Encounter: Payer: Self-pay | Admitting: Allergy

## 2023-12-24 ENCOUNTER — Encounter: Payer: Self-pay | Admitting: Allergy

## 2023-12-24 NOTE — Progress Notes (Unsigned)
 Follow Up Note  RE: Bradley [name] MRN: [id number] DOB: 1996-09-09 Date of Office Visit: 12/25/2023  Referring provider: [name], MD Primary care provider: [name], MD  Chief Complaint: No chief complaint on file.  History of Present Illness: I had the pleasure of seeing Bradley [name] for a follow up visit at the [facility] on 12/24/2023. He is a 28 y.o. male, who is being followed for allergic rhinitis, asthma, alpha-gal allergy, adverse food reaction, GERD. His previous allergy office visit was on 06/26/2023 with Dr. Selena Batten. Today is a regular follow up visit.  Discussed the use of AI scribe software for clinical note transcription with the patient, who gave verbal consent to proceed.  History of Present Illness            ***  Assessment and Plan: Bradley [name] is a 28 y.o. male with: Seasonal allergic rhinitis due to pollen Allergic rhinitis due to animal dander Allergic rhinitis due to dust mite Allergic rhinitis due to mold Allergy to cockroaches Past history - 2019 blood work was positive to dust mites, cat, dog, grass, tree, borderline to ragweed, cockroach. Started AIT on 02/14/2018 (Mite-Cockroach-Ragweed & Pollen-Pet). Stopped AIT in 2021. Can't take Singulair.  Interim history - controlled. Some nasal congestion. Use over the counter antihistamines such as Zyrtec (cetirizine), Claritin (loratadine), Allegra (fexofenadine), or Xyzal (levocetirizine) daily as needed. May take twice a day during allergy flares. May switch antihistamines every few months. Use Flonase (fluticasone) nasal spray 1-2 sprays per nostril once a day as needed for nasal congestion.  Nasal saline spray (i.e., Simply Saline) or nasal saline lavage (i.e., NeilMed) is recommended as needed and prior to medicated nasal sprays.   Moderate persistent asthma without complication Infrequent use of Breo (1-2 times in the past month) with intermittent use of Albuterol rescue  inhaler (1-2 times per week). Patient reports feeling well overall with no specific triggers identified. Today's spirometry was normal but not as good as previous ones. Daily controller medication(s): restart Breo 1 puff once a day and rinse mouth after each use.  Use it daily for 1 month and let me know how you feel.  If no significant improvement then will switch to Airsupra rescue inhaler.  May use albuterol rescue inhaler 2 puffs or nebulizer every 4 to 6 hours as needed for shortness of breath, chest tightness, coughing, and wheezing. May use albuterol rescue inhaler 2 puffs 5 to 15 minutes prior to strenuous physical activities. Monitor frequency of use - if you need to use it more than twice per week on a consistent basis let us know.    Allergy to alpha-gal Past history -  2024 bloodwork alpha gal still positive. Interim history - multiple tick bites this year.  Avoid red meat. Will recheck alpha gal next year.  For mild symptoms you can take over the counter antihistamines such as Benadryl and monitor symptoms closely. If symptoms worsen or if you have severe symptoms including breathing issues, throat closure, significant swelling, whole body hives, severe diarrhea and vomiting, lightheadedness then inject epinephrine and seek immediate medical care afterwards.   Other adverse food reactions, not elsewhere classified, subsequent encounter Past history - 2024 bloodwork shrimp borderline positive. No issues with oysters, lobsters, crab. Return for in office shrimp challenge.    GERD Continue lifestyle and dietary modifications. Take omeprazole 20mg  in the morning. Nothing to eat or drink for 30 min afterwards.      Return in  about 3 months (around 09/26/2023) for Food challenge. Assessment and Plan              No follow-ups on file.  No orders of the defined types were placed in this encounter.  Lab Orders  No laboratory test(s) ordered today     Diagnostics: Spirometry:  Tracings reviewed. His effort: {Blank single:19197::"Good reproducible efforts.","It was hard to get consistent efforts and there is a question as to whether this reflects a maximal maneuver.","Poor effort, data can not be interpreted."} FVC: ***L FEV1: ***L, ***% predicted FEV1/FVC ratio: ***% Interpretation: {Blank single:19197::"Spirometry consistent with mild obstructive disease","Spirometry consistent with moderate obstructive disease","Spirometry consistent with severe obstructive disease","Spirometry consistent with possible restrictive disease","Spirometry consistent with mixed obstructive and restrictive disease","Spirometry uninterpretable due to technique","Spirometry consistent with normal pattern","No overt abnormalities noted given today's efforts"}.  Please see scanned spirometry results for details.  Skin Testing: {Blank single:19197::"Select foods","Environmental allergy panel","Environmental allergy panel and select foods","Food allergy panel","None","Deferred due to recent antihistamines use"}. *** Results discussed with patient/family.   Medication List:  Current Outpatient Medications  Medication Sig Dispense Refill   albuterol (PROVENTIL) (2.5 MG/3ML) 0.083% nebulizer solution Take 3 mLs (2.5 mg total) by nebulization every 4 (four) hours as needed for wheezing or shortness of breath (coughing fits). 75 mL 1   BREO ELLIPTA 100-25 MCG/ACT AEPB INHALE 1 PUFF INTO THE LUNGS DAILY. RINSE MOUTH AFTER USE 60 each 3   citalopram (CELEXA) 20 MG tablet TAKE 1 TABLET(20 MG) BY MOUTH DAILY 90 tablet 0   clonazePAM (KLONOPIN) 0.5 MG tablet TAKE 1 TABLET(0.5 MG) BY MOUTH TWICE DAILY AS NEEDED FOR ANXIETY 30 tablet 0   cycloSPORINE (RESTASIS OP) Apply to eye.     EPINEPHrine (AUVI-Q) 0.3 mg/0.3 mL IJ SOAJ injection Inject 0.3 mg into the muscle as needed for anaphylaxis. 2 each 1   fluticasone (FLONASE) 50 MCG/ACT nasal spray Place 2 sprays into both  nostrils in the morning and at bedtime. 16 g 6   fluticasone furoate-vilanterol (BREO ELLIPTA) 200-25 MCG/ACT AEPB Inhale 1 puff into the lungs daily. Rinse your mouth after each use. 60 each 3   levocetirizine (XYZAL) 5 MG tablet Take 1 tablet (5 mg total) by mouth every evening. 30 tablet 5   omeprazole (PRILOSEC) 40 MG capsule Take by mouth.     sildenafil (VIAGRA) 100 MG tablet TAKE 1/2 TO 1 TABLET(50 TO 100 MG) BY MOUTH DAILY AS NEEDED FOR ERECTILE DYSFUNCTION 10 tablet 1   No current facility-administered medications for this visit.   Allergies: Allergies  Allergen Reactions   Shellfish Allergy Anaphylaxis   Hydrocodone Itching    Pt states he feels itchy after taking, but it doesn't preclude him taking hydrocodone if needed About 2-3 years ago.  It was the first time he took hydrocodone.   Other Diarrhea    Slightly allergic to red meat since tick bite.Marland KitchenMarland KitchenNot anaphylaxis, but GI sensitivity.   I reviewed his past medical history, social history, family history, and environmental history and no significant changes have been reported from his previous visit.  Review of Systems  Constitutional:  Negative for appetite change, chills, fever and unexpected weight change.  HENT:  Negative for congestion and rhinorrhea.   Eyes:  Negative for itching.  Respiratory:  Positive for wheezing. Negative for cough, chest tightness and shortness of breath.   Gastrointestinal:  Negative for abdominal pain.  Skin:  Negative for rash.  Allergic/Immunologic: Positive for environmental allergies and food allergies.  Neurological:  Negative for headaches.  Objective: There were no vitals taken for this visit. There is no height or weight on file to calculate BMI. Physical Exam Vitals and nursing note reviewed.  Constitutional:      Appearance: Normal appearance. He is well-developed. He is obese.  HENT:     Head: Normocephalic and atraumatic.     Right Ear: Tympanic membrane and external ear  normal.     Left Ear: Tympanic membrane and external ear normal.     Ears:     Comments: Mild cerumen in left side.    Nose: Nose normal.     Mouth/Throat:     Mouth: Mucous membranes are moist.     Pharynx: Oropharynx is clear.  Eyes:     Conjunctiva/sclera: Conjunctivae normal.  Cardiovascular:     Rate and Rhythm: Normal rate and regular rhythm.     Heart sounds: Normal heart sounds. No murmur heard. Pulmonary:     Effort: Pulmonary effort is normal.     Breath sounds: Normal breath sounds. No wheezing, rhonchi or rales.  Musculoskeletal:     Cervical back: Neck supple.  Skin:    General: Skin is warm.     Findings: No rash.  Neurological:     Mental Status: He is alert and oriented to person, place, and time.  Psychiatric:        Behavior: Behavior normal.    Previous notes and tests were reviewed. The plan was reviewed with the patient/family, and all questions/concerned were addressed.  It was my pleasure to see Bradley [name] today and participate in his care. Please feel free to contact me with any questions or concerns.  Sincerely,  Wyline Mood, DO Allergy & Immunology  [facility] office: [phone] [facility] office: [phone]

## 2023-12-25 ENCOUNTER — Ambulatory Visit (INDEPENDENT_AMBULATORY_CARE_PROVIDER_SITE_OTHER): Admitting: Allergy

## 2023-12-25 ENCOUNTER — Encounter: Payer: Self-pay | Admitting: Allergy

## 2023-12-25 VITALS — BP 112/70 | HR 80 | Temp 97.7°F | Resp 20 | Ht 77.0 in | Wt 364.5 lb

## 2023-12-25 DIAGNOSIS — W57XXXD Bitten or stung by nonvenomous insect and other nonvenomous arthropods, subsequent encounter: Secondary | ICD-10-CM

## 2023-12-25 DIAGNOSIS — J4540 Moderate persistent asthma, uncomplicated: Secondary | ICD-10-CM | POA: Diagnosis not present

## 2023-12-25 DIAGNOSIS — Z91018 Allergy to other foods: Secondary | ICD-10-CM

## 2023-12-25 DIAGNOSIS — S20461A Insect bite (nonvenomous) of right back wall of thorax, initial encounter: Secondary | ICD-10-CM

## 2023-12-25 DIAGNOSIS — W57XXXA Bitten or stung by nonvenomous insect and other nonvenomous arthropods, initial encounter: Secondary | ICD-10-CM

## 2023-12-25 DIAGNOSIS — J301 Allergic rhinitis due to pollen: Secondary | ICD-10-CM

## 2023-12-25 DIAGNOSIS — J3081 Allergic rhinitis due to animal (cat) (dog) hair and dander: Secondary | ICD-10-CM | POA: Diagnosis not present

## 2023-12-25 DIAGNOSIS — Z91038 Other insect allergy status: Secondary | ICD-10-CM

## 2023-12-25 DIAGNOSIS — J3089 Other allergic rhinitis: Secondary | ICD-10-CM

## 2023-12-25 DIAGNOSIS — K219 Gastro-esophageal reflux disease without esophagitis: Secondary | ICD-10-CM

## 2023-12-25 MED ORDER — ALBUTEROL SULFATE HFA 108 (90 BASE) MCG/ACT IN AERS: 2.0000 | INHALATION_SPRAY | RESPIRATORY_TRACT | 1 refills | Status: DC | PRN

## 2023-12-25 MED ORDER — DOXYCYCLINE HYCLATE 100 MG PO CAPS: 100.0000 mg | ORAL_CAPSULE | Freq: Two times a day (BID) | ORAL | 0 refills | Status: DC

## 2023-12-25 MED ORDER — AIRSUPRA 90-80 MCG/ACT IN AERO
2.0000 | INHALATION_SPRAY | RESPIRATORY_TRACT | 2 refills | Status: AC | PRN
Start: 2023-12-25 — End: ?

## 2023-12-25 NOTE — Patient Instructions (Addendum)
 Asthma Daily controller medication(s): continue Breo 1 puff once a day and rinse mouth after each use.  May use albuterol rescue inhaler 2 puffs or nebulizer every 4 to 6 hours as needed for shortness of breath, chest tightness, coughing, and wheezing. May use albuterol rescue inhaler 2 puffs 5 to 15 minutes prior to strenuous physical activities. Monitor frequency of use - if you need to use it more than twice per week on a consistent basis let us know.   I'm also sending in a new rescue inhaler and see if it's covered - Airsupra. May use Airsupra rescue inhaler 2 puffs every 4 to 6 hours as needed for shortness of breath, chest tightness, coughing, and wheezing. Do not use more than 12 puffs in 24 hours. May use Airsupra rescue inhaler 2 puffs 5 to 15 minutes prior to strenuous physical activities. Rinse mouth after each use.  Coupon given.  Monitor frequency of use - if you need to use it more than twice per week on a consistent basis let us know.  Breathing control goals:  Full participation in all desired activities (may need albuterol before activity) Albuterol use two times or less a week on average (not counting use with activity) Cough interfering with sleep two times or less a month Oral steroids no more than once a year No hospitalizations   Food allergy Avoid mammalian meat.  Will recheck alpha gal next year.  For mild symptoms you can take over the counter antihistamines such as Benadryl and monitor symptoms closely. If symptoms worsen or if you have severe symptoms including breathing issues, throat closure, significant swelling, whole body hives, severe diarrhea and vomiting, lightheadedness then inject epinephrine and seek immediate medical care afterwards.  Return for shrimp food challenge: You must be off antihistamines for 3-5 days before. Must be in good health and not ill. No vaccines/injections/antibiotics within the past 7 days. Plan on being in the office for 2-3  hours and must bring in the food you want to do the oral challenge for - 1 serving size of lightly seasoned fully cooked shrimp.   Environmental allergies Use over the counter antihistamines such as Zyrtec (cetirizine), Claritin (loratadine), Allegra (fexofenadine), or Xyzal (levocetirizine) daily as needed. May take twice a day during allergy flares. May switch antihistamines every few months. Use Flonase (fluticasone) nasal spray 1-2 sprays per nostril once a day as needed for nasal congestion.  Nasal saline spray (i.e., Simply Saline) or nasal saline lavage (i.e., NeilMed) is recommended as needed and prior to medicated nasal sprays.  Heartburn: Continue lifestyle and dietary modifications. Take omeprazole 20mg  in the morning. Nothing to eat or drink for 30 min afterwards.   Recent tick bite prophylaxis Take doxycycline 100mg  twice a day for 10 days.  Follow up in 4 months or sooner if needed.   Alpha-gal and Red Meat Allergy   Overview An allergy to "alpha-gal" refers to having a severe and potentially life-threatening allergy to a carbohydrate molecule called galactose-alpha-1,3-galactose that is found in most mammalian or "red meat". Unlike other food allergies which typically occur within minutes of ingestion, symptoms from eating red meat such as pork, lamb or beef may be delayed, occurring 3-8 hours after eating. Most food allergies are directed against a protein molecule, but alpha-gal is unusual because it is a carbohydrate, and a delay in its absorption may explain the delay in symptoms.  Helpful website: [employer]Festival.is  What are the symptoms of an alpha-gal allergy? As with other food  allergies, signs or symptoms of an allergy to alpha-gal may include: Hives and itching  Swelling of your lips, face or eyelids  Shortness of breath, cough or wheezing  Abdominal pain, nausea, diarrhea or vomiting The most severe reaction, anaphylaxis, can present as a combination of  several of these symptoms, may include low blood pressure, and is potentially fatal.  Because these symptoms are delayed, you may only wake up with them in the middle of the night after an evening meal.  How is an alpha-gal allergy diagnosed? Diagnosis of this allergy starts with your allergist taking an appropriate history and physical examination. Because the onset is usually quite delayed, it can be hard to associate the symptoms with eating red meat many hours previously. Triggers include any red meat - including beef, pork, lamb or even horse products. It may occur after eating hotdogs and hamburgers. In very rare cases the reaction may extend to milk or dairy proteins and gelatin.  Your allergist may recommend testing that includes skin tests to the relevant animal proteins and blood tests which measure the levels of a specific immunoglobulin E (IgE) antibody, to mammalian meats. An investigational blood test, IgE against alpha-gal itself, may also aid in the diagnosis.  How is an alpha-gal allergy treated? Immediate symptoms such as hives or shortness of breath are treated the same as any other food allergy - in an urgent care setting with anti-histamines, epinephrine and other medications. Prevention long-term involves avoidance of all red meat in sensitized individuals. You may be advised to carry an epinephrine auto-injector, to be used in case of subsequent accidental exposures and reaction. These measures do not necessarily mean switching to a full vegetarian diet, since poultry and fish can be consumed and do not cause similar reactions. As with other food allergies, there is the possibility that over time the sensitivity diminishes - although these changes may take many years to become apparent.  How do you become allergic to alpha-gal? Alpha-gal is a molecule carried in the saliva of the [place] tick and other potential arthropods typically after feeding on mammalian blood. People that  are bitten by the tick, especially those that are bitten repeatedly, are at risk of becoming sensitized and producing the IgE necessary to then cause allergic reactions. Interestingly, allergic reactions may occur to red meat, to subsequent tick bites, and even to medications that contain alpha-gal. Cetuximab is a cancer medication that contains alpha-gal, and people who have had allergic reactions to this medication (these are typically immediate reactions, because it is infused intravenously) have a higher risk for red meat allergy and are likely to have been bitten by ticks in the past. As might be expected, the incidence of tick bites is much higher in the [place] the traditional habitat for the tick. However, cases are now increasingly reported in the [place] and [place] states. And it is a phenomenon that has been observed worldwide, with different ticks responsible for similar cases of red meat allergy in many other countries such as [place], [place] and [place].  The discovery of this peculiar allergy has allowed researchers to correlate tick bites with many cases of anaphylaxis that would previously have been classified as 'idiopathic', or of unknown cause. Also, while it was originally thought that the [employer] tick had to feast on mammalian blood in order to carry the alpha-gal molecule, more recent research has shown that it may carry this molecule and be capable of sensitizing humans independently.  How do you prevent an alpha-gal allergy? Because this allergy is predominantly tick born, you are more likely at risk if you often go outdoors in wooded areas for activities such as hiking, fishing or hunting. The key strategy is to prevent tick bites. This may include wearing long sleeved shirts or pants, using appropriate insect repellants, and surveying for ticks after spending time outdoors. Any observed ticks should be removed carefully by cleaning the site with rubbing  alcohol, then using tweezers to pull the tick's head up carefully from the skin using steady pressure. Clean your hands and the site one more time and make sure not to crush the tick between your fingers.

## 2023-12-27 ENCOUNTER — Other Ambulatory Visit: Payer: Self-pay | Admitting: Family Medicine

## 2023-12-27 ENCOUNTER — Ambulatory Visit: Admitting: Allergy

## 2023-12-28 ENCOUNTER — Encounter: Payer: Self-pay | Admitting: Family Medicine

## 2023-12-28 ENCOUNTER — Ambulatory Visit: Admitting: Family Medicine

## 2023-12-28 VITALS — BP 130/80 | HR 77 | Temp 98.1°F | Wt 365.3 lb

## 2023-12-28 DIAGNOSIS — L720 Epidermal cyst: Secondary | ICD-10-CM | POA: Diagnosis not present

## 2023-12-28 DIAGNOSIS — W57XXXA Bitten or stung by nonvenomous insect and other nonvenomous arthropods, initial encounter: Secondary | ICD-10-CM

## 2023-12-28 NOTE — Progress Notes (Signed)
 Established Patient Office Visit  Subjective   Patient ID: Bradley [name], male    DOB: 01-19-1996  Age: 28 y.o. MRN: [id number]  Chief Complaint  Patient presents with   Cyst    HPI   Bradley [name] is seen with cystic area left inner groin which has been present for at least a month and a half.  Sometimes drains clear fluid and sometimes puslike drainage.  Denies any erythema.  Currently not tender.  Recent tick bite right upper thoracic area.  Saw allergist and was prescribed doxycycline though has had no headaches, increased arthralgias, fever, or other rash.  Past Medical History:  Diagnosis Date   Allergy to alpha-gal    Anxiety    Chronic headaches    Clavicular fracture    Diverticulosis    History of chickenpox    Mild intermittent asthma 06/07/2018   Shingles    28 years old.   Tubular adenoma of colon    Urticaria 05/18/2020   Past Surgical History:  Procedure Laterality Date   COLONOSCOPY     CYSTECTOMY  01/2019   scrotum area    ESOPHAGEAL MANOMETRY N/A 06/09/2020   Procedure: ESOPHAGEAL MANOMETRY (EM);  Surgeon: Shellia Cleverly, DO;  Location: [facility];  Service: Gastroenterology;  Laterality: N/A;   ESOPHAGOGASTRODUODENOSCOPY     shoulder surgery Right 08/26/2019   WISDOM TOOTH EXTRACTION      reports that he has never smoked. He has never used smokeless tobacco. He reports current alcohol use of about 3.0 standard drinks of alcohol per week. He reports current drug use. Frequency: 2.00 times per week. Drug: Marijuana. family history includes Colon polyps in his mother; Diabetes in his father; Hypertension in his mother; Migraines in his sister. Allergies  Allergen Reactions   Shellfish Allergy Anaphylaxis   Hydrocodone Itching    Pt states he feels itchy after taking, but it doesn't preclude him taking hydrocodone if needed About 2-3 years ago.  It was the first time he took hydrocodone.   Other Diarrhea    Slightly allergic to red meat since tick  bite.Marland KitchenMarland KitchenNot anaphylaxis, but GI sensitivity.    Review of Systems  Constitutional:  Negative for chills and fever.  Neurological:  Negative for headaches.      Objective:     BP 130/80 (BP Location: Left Arm, Patient Position: Sitting, Cuff Size: Large)   Pulse 77   Temp 98.1 F (36.7 C) (Oral)   Wt (!) 365 lb 4.8 oz (165.7 kg)   SpO2 95%   BMI 43.32 kg/m    Physical Exam Vitals reviewed.  Constitutional:      General: He is not in acute distress.    Appearance: He is not ill-appearing.  Cardiovascular:     Rate and Rhythm: Normal rate and regular rhythm.  Skin:    Comments: Very small pinpoint eschar right upper thoracic area.  No surrounding erythema.  Left upper inner groin reveals small cystic lesion which is about 1/2 cm diameter.  Nonfluctuant.  No overlying erythema.  No warmth.  Neurological:     Mental Status: He is alert.      No results found for any visits on 12/28/23.    The ASCVD Risk score (Arnett DK, et al., 2019) failed to calculate for the following reasons:   The 2019 ASCVD risk score is only valid for ages 29 to 42    Assessment & Plan:   #1 probable epidermal cyst left groin.  Not currently infected.  Reassurance.  We explained this would probably have to be surgically excised if his goal is to get rid of this.  He wishes to observe for now  #2 tick bite upper back region.  No signs of secondary infection.  Reviewed signs and symptoms to watch out for in terms of tick fever  Evelena Peat, MD

## 2024-01-01 ENCOUNTER — Other Ambulatory Visit: Payer: Self-pay | Admitting: Family Medicine

## 2024-02-04 ENCOUNTER — Encounter: Payer: Self-pay | Admitting: Family Medicine

## 2024-02-04 ENCOUNTER — Ambulatory Visit (INDEPENDENT_AMBULATORY_CARE_PROVIDER_SITE_OTHER): Admitting: Family Medicine

## 2024-02-04 VITALS — BP 116/70 | HR 73 | Temp 97.8°F | Wt 357.6 lb

## 2024-02-04 DIAGNOSIS — R519 Headache, unspecified: Secondary | ICD-10-CM | POA: Diagnosis not present

## 2024-02-04 MED ORDER — PREDNISONE 20 MG PO TABS: ORAL_TABLET | ORAL | 0 refills | Status: DC

## 2024-02-04 NOTE — Progress Notes (Signed)
 Established Patient Office Visit  Subjective   Patient ID: Bradley [name], male    DOB: [birth year]  Age: 28 y.o. MRN: [id number]  Chief Complaint  Patient presents with   Migraine    Patient complains of migraines, x5 days, Tried Tylenol     HPI   Bradley [name] is here with about 5-day history of headache.  Somewhat intermittent.  He thinks this may be a variant of migraine.  Has had migraines previously and has seen neurologist with [facility].  Has been tried previously on Nurtec but has not seen good response.  Current headache has been right side exclusively.  Somewhat throbbing.  No nausea.  Does have light sensitivity with migraines.  He thinks there may be a tension component.  Denies any head injury.  He states he had MRI scan last December which was unremarkable.  Denies any other focal neurologic symptoms such as weakness or numbness.  No confusion. Has taken some Tylenol  with mild relief.  No recent fever.  No tick bites.  Past Medical History:  Diagnosis Date   Allergy to alpha-gal    Anxiety    Chronic headaches    Clavicular fracture    Diverticulosis    History of chickenpox    Mild intermittent asthma 06/07/2018   Shingles    28 years old.   Tubular adenoma of colon    Urticaria 05/18/2020   Past Surgical History:  Procedure Laterality Date   COLONOSCOPY     CYSTECTOMY  01/2019   scrotum area    ESOPHAGEAL MANOMETRY N/A 06/09/2020   Procedure: ESOPHAGEAL MANOMETRY (EM);  Surgeon: Bradley Kinder, DO;  Location: [facility];  Service: Gastroenterology;  Laterality: N/A;   ESOPHAGOGASTRODUODENOSCOPY     shoulder surgery Right 08/26/2019   WISDOM TOOTH EXTRACTION      reports that he has never smoked. He has never used smokeless tobacco. He reports current alcohol use of about 3.0 standard drinks of alcohol per week. He reports current drug use. Frequency: 2.00 times per week. Drug: Marijuana. family history includes Colon polyps in his mother; Diabetes in his father;  Hypertension in his mother; Migraines in his sister. Allergies  Allergen Reactions   Shellfish Allergy Anaphylaxis   Hydrocodone Itching    Pt states he feels itchy after taking, but it doesn't preclude him taking hydrocodone if needed About 2-3 years ago.  It was the first time he took hydrocodone.   Other Diarrhea    Slightly allergic to red meat since tick bite.Bradley AasAaron AasNot anaphylaxis, but GI sensitivity.      Review of Systems  Constitutional:  Negative for chills and fever.  Cardiovascular:  Negative for chest pain.  Skin:  Negative for rash.  Neurological:  Positive for headaches.      Objective:     BP 116/70 (BP Location: Left Arm, Patient Position: Sitting, Cuff Size: Large)   Pulse 73   Temp 97.8 F (36.6 C) (Oral)   Wt (!) 357 lb 9.6 oz (162.2 kg)   SpO2 97%   BMI 42.41 kg/m  BP Readings from Last 3 Encounters:  02/04/24 116/70  12/28/23 130/80  12/25/23 112/70   Wt Readings from Last 3 Encounters:  02/04/24 (!) 357 lb 9.6 oz (162.2 kg)  12/28/23 (!) 365 lb 4.8 oz (165.7 kg)  12/25/23 (!) 364 lb 8 oz (165.3 kg)      Physical Exam Vitals reviewed.  Constitutional:      General: He is not in acute distress.  Appearance: He is not ill-appearing.  HENT:     Right Ear: Tympanic membrane normal.     Left Ear: Tympanic membrane normal.  Eyes:     Pupils: Pupils are equal, round, and reactive to light.  Cardiovascular:     Rate and Rhythm: Normal rate and regular rhythm.  Musculoskeletal:     Cervical back: Neck supple.  Lymphadenopathy:     Cervical: No cervical adenopathy.  Neurological:     General: No focal deficit present.     Mental Status: He is alert.      No results found for any visits on 02/04/24.    The ASCVD Risk score (Arnett DK, et al., 2019) failed to calculate for the following reasons:   The 2019 ASCVD risk score is only valid for ages 64 to 53    Assessment & Plan:   Unilateral right-sided headache.  History of migraines.   He states this headache is similar to prior headaches.  Occasionally throbbing with some light sensitivity.  No associated nausea or vomiting.  No focal neurologic symptoms or findings.  He has tried Nurtec previously per neurologist without good response.  He is encouraged to follow-up with them to explore other potential acute treatments.  We discussed starting prednisone  20 mg 2 tablets daily for 5 days given his persistence of headache.  Follow-up for any persistent or new symptoms   Glean Lamy, MD

## 2024-02-04 NOTE — Patient Instructions (Addendum)
 Follow up with Neurologist if headaches persist.

## 2024-02-14 ENCOUNTER — Other Ambulatory Visit: Payer: Self-pay | Admitting: Family Medicine

## 2024-03-13 ENCOUNTER — Ambulatory Visit
Admission: EM | Admit: 2024-03-13 | Discharge: 2024-03-13 | Disposition: A | Discharge status: Home / Self Care | Attending: Emergency Medicine | Admitting: Emergency Medicine

## 2024-03-13 ENCOUNTER — Other Ambulatory Visit: Payer: Self-pay

## 2024-03-13 DIAGNOSIS — L237 Allergic contact dermatitis due to plants, except food: Secondary | ICD-10-CM | POA: Diagnosis not present

## 2024-03-13 MED ORDER — PREDNISONE 10 MG (21) PO TBPK: ORAL_TABLET | Freq: Every day | ORAL | 0 refills | Status: DC

## 2024-03-13 MED ORDER — METHYLPREDNISOLONE ACETATE 80 MG/ML IJ SUSP
60.0000 mg | Freq: Once | INTRAMUSCULAR | Status: AC
  Administered 2024-03-13: 60 mg via INTRAMUSCULAR

## 2024-03-13 NOTE — ED Provider Notes (Signed)
 [facility]    CSN: [id number] Arrival date & time: 03/13/24  1139      History   Chief Complaint No chief complaint on file.   HPI Bradley [name] is a 28 y.o. male.   Patient presents for evaluation of erythematous pruritic rash present to the bilateral upper and lower extremities beginning 4 days ago after completing yard work.  Believes possible exposure to poison ivy, endorses rash at least yearly.  Has noticed scant clear drainage.  Symptoms exacerbated by heat.  Has attempted calamine lotion.   Past Medical History:  Diagnosis Date   Allergy to alpha-gal    Anxiety    Chronic headaches    Clavicular fracture    Diverticulosis    History of chickenpox    Mild intermittent asthma 06/07/2018   Shingles    28 years old.   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   Not well controlled moderate persistent asthma 09/30/2021   Lesion of palate 09/06/2021   Allergy to alpha-gal 06/16/2021   Anal fissure 03/16/2021   Right ankle pain 07/06/2020   Dysphagia    Gastroesophageal reflux disease 05/19/2020   Urticaria 05/18/2020   Seasonal and perennial allergic rhinoconjunctivitis 04/19/2020   Mild persistent asthma, uncomplicated 03/04/2020   Low back pain 12/18/2019   Shortness of breath 10/15/2019   Food allergy 02/07/2019   Hamstring injury, right, initial encounter 01/23/2019   Migraine without aura and without status migrainosus, not intractable 07/10/2018   Morbid obesity with BMI of 40.0-44.9, adult (HCC) 07/10/2018   Mild intermittent asthma without complication 06/07/2018   Atypical chest pain 12/21/2017   Generalized abdominal pain 12/17/2017   Anxiety 11/28/2017    Past Surgical History:  Procedure Laterality Date   COLONOSCOPY      CYSTECTOMY  01/2019   scrotum area    ESOPHAGEAL MANOMETRY N/A 06/09/2020   Procedure: ESOPHAGEAL MANOMETRY (EM);  Surgeon: Annis Kinder, DO;  Location: [facility];  Service: Gastroenterology;  Laterality: N/A;   ESOPHAGOGASTRODUODENOSCOPY     shoulder surgery Right 08/26/2019   WISDOM TOOTH EXTRACTION         Home Medications    Prior to Admission medications   Medication Sig Start Date End Date Taking? Authorizing Provider  albuterol  (PROVENTIL ) (2.5 MG/3ML) 0.083% nebulizer solution Take 3 mLs (2.5 mg total) by nebulization every 4 (four) hours as needed for wheezing or shortness of breath (coughing fits). 01/11/23   Trudy Fusi, DO  albuterol  (VENTOLIN  HFA) 108 (90 Base) MCG/ACT inhaler Inhale 2 puffs into the lungs every 4 (four) hours as needed for wheezing or shortness of breath (coughing fits). 12/25/23   Trudy Fusi, DO  Albuterol -Budesonide (AIRSUPRA ) 90-80 MCG/ACT AERO Inhale 2 puffs into the lungs every 4 (four) hours as needed (coughing, wheezing, chest tightness). Do not exceed 12 puffs in 24 hours. 12/25/23   Trudy Fusi, DO  citalopram  (CELEXA ) 20 MG tablet TAKE 1 TABLET(20 MG) BY MOUTH DAILY 12/27/23   Burchette, Marijean Shouts, MD  clonazePAM  (KLONOPIN ) 0.5 MG tablet TAKE 1 TABLET(0.5 MG) BY MOUTH TWICE DAILY AS NEEDED FOR ANXIETY 02/18/24   Burchette, Marijean Shouts, MD  cycloSPORINE (RESTASIS OP) Apply to eye.    [provider]  doxycycline  (VIBRAMYCIN ) 100  MG capsule Take 1 capsule (100 mg total) by mouth 2 (two) times daily. 12/25/23   Trudy Fusi, DO  EPINEPHrine  (AUVI-Q ) 0.3 mg/0.3 mL IJ SOAJ injection Inject 0.3 mg into the muscle as needed for anaphylaxis. 06/26/23   Trudy Fusi, DO  fluticasone  (FLONASE ) 50 MCG/ACT nasal spray Place 2 sprays into both nostrils in the morning and at bedtime. 10/04/23   Evelina Hippo, MD  fluticasone  furoate-vilanterol (BREO ELLIPTA ) 200-25 MCG/ACT AEPB Inhale 1 puff into the lungs daily. Rinse your mouth after each use. 03/22/23   Trudy Fusi, DO  levocetirizine (XYZAL ) 5 MG tablet Take 1 tablet (5 mg total) by mouth every evening. 03/07/22   Ardie Kras, FNP  omeprazole  (PRILOSEC) 40 MG capsule Take by mouth. 03/26/23 03/25/24  [provider]  predniSONE  (DELTASONE ) 20 MG tablet Take two tablets by mouth once daily for 5 days. 02/04/24   Burchette, Marijean Shouts, MD  sildenafil  (VIAGRA ) 100 MG tablet TAKE 1/2 TO 1 TABLET(50 TO 100 MG) BY MOUTH DAILY AS NEEDED FOR ERECTILE DYSFUNCTION 12/27/23   Burchette, Marijean Shouts, MD    Family History Family History  Problem Relation Age of Onset   Hypertension Mother    Colon polyps Mother        might be mistakrn   Diabetes Father        type II   Migraines Sister    Colon cancer Neg Hx    Esophageal cancer Neg Hx    Rectal cancer Neg Hx    Stomach cancer Neg Hx     Social History Social History   Tobacco Use   Smoking status: Never   Smokeless tobacco: Never  Vaping Use   Vaping status: Never Used  Substance Use Topics   Alcohol use: Yes    Alcohol/week: 3.0 standard drinks of alcohol    Types: 3 Standard drinks or equivalent per week    Comment: occ   Drug use: Yes    Frequency: 2.0 times per week    Types: Marijuana     Allergies   Shellfish allergy, Hydrocodone, and Other   Review of Systems Review of Systems   Physical Exam Triage Vital Signs ED Triage Vitals  Encounter Vitals Group     BP 03/13/24 1147 130/83     Girls Systolic BP Percentile --      Girls Diastolic BP Percentile --      Boys Systolic BP Percentile --      Boys Diastolic BP Percentile --      Pulse Rate 03/13/24 1147 75     Resp 03/13/24 1147 17     Temp 03/13/24 1147 97.9 F (36.6 C)     Temp Source 03/13/24 1147 Oral     SpO2 03/13/24 1147 97 %     Weight --      Height --      Head Circumference --      Peak Flow --      Pain Score 03/13/24 1146 0     Pain Loc --      Pain Education --      Exclude from Growth Chart --    No data found.  Updated Vital Signs BP  130/83   Pulse 75   Temp 97.9 F (36.6 C) (Oral)   Resp 17   SpO2 97%   Visual Acuity Right Eye Distance:   Left Eye Distance:   Bilateral Distance:    Right Eye Near:  Left Eye Near:    Bilateral Near:     Physical Exam Constitutional:      Appearance: Normal appearance.   Eyes:     Extraocular Movements: Extraocular movements intact.   Pulmonary:     Effort: Pulmonary effort is normal.   Skin:    Comments: Erythematous blistering rash present to the left lateral aspect of the neck, the bilateral upper and lower extremities   Neurological:     Mental Status: He is alert and oriented to person, place, and time. Mental status is at baseline.      UC Treatments / Results  Labs (all labs ordered are listed, but only abnormal results are displayed) Labs Reviewed - No data to display  EKG   Radiology No results found.  Procedures Procedures (including critical care time)  Medications Ordered in UC Medications - No data to display  Initial Impression / Assessment and Plan / UC Course  I have reviewed the triage vital signs and the nursing notes.  Pertinent labs & imaging results that were available during my care of the patient were reviewed by me and considered in my medical decision making (see chart for details).  Poison ivy dermatitis  Presentation consistent with above diagnosis, no known exposure, started after completing yard work, Depo-Medrol  IM given and prednisone  prescribed for home use recommended oral and topical antihistamines as well as continued calamine lotion, recommended follow-up as needed Final Clinical Impressions(s) / UC Diagnoses   Final diagnoses:  None   Discharge Instructions   None    ED Prescriptions   None    PDMP not reviewed this encounter.   Reena Canning, NP 03/13/24 [phone]

## 2024-03-13 NOTE — Discharge Instructions (Signed)
Today you are being treated for the poison ivy/oak rash  You have been given an injection of steroids today in the office today to help reduce the inflammatory process that occurs with this rash which will help minimize your itching as well as begin to clear  Starting tomorrow take prednisone every morning with food as directed, to continue the above process  You may continue use of topical calamine or Benadryl cream to help manage itching, you may also continue oral Benadryl  Please avoid long exposures to heat such as a hot steamy shower or being outside as this may cause further irritation to your rash  You may follow-up with his urgent care as needed if symptoms persist or worsen 

## 2024-03-13 NOTE — ED Triage Notes (Signed)
 Pt states has poison ivy on left neck, left arm and left legx4d. Pt states its itiching and is worse in the heat. Pt was weed wacking yard when he got it

## 2024-03-14 DIAGNOSIS — K1379 Other lesions of oral mucosa: Secondary | ICD-10-CM | POA: Insufficient documentation

## 2024-03-14 DIAGNOSIS — K1370 Unspecified lesions of oral mucosa: Secondary | ICD-10-CM | POA: Insufficient documentation

## 2024-04-08 ENCOUNTER — Other Ambulatory Visit: Payer: Self-pay

## 2024-04-08 MED ORDER — FLUTICASONE FUROATE-VILANTEROL 200-25 MCG/ACT IN AEPB
1.0000 | INHALATION_SPRAY | Freq: Every day | RESPIRATORY_TRACT | 3 refills | Status: DC
Start: 2024-04-08 — End: 2024-04-24

## 2024-04-10 ENCOUNTER — Other Ambulatory Visit: Payer: Self-pay

## 2024-04-10 ENCOUNTER — Telehealth: Payer: Self-pay

## 2024-04-10 NOTE — Telephone Encounter (Signed)
 Received fax from [facility] in [place] requesting refill for patients breo inhaler this was already done 7/15 with [facility] con[employer]mation . I called [phone] spoke to [place] she states the q sends out duplicates if wires get crossed it is ready and patient has an 11am pick up time for today. Please disregard.

## 2024-04-23 NOTE — Progress Notes (Unsigned)
 Follow Up Note  RE: Bradley [name] MRN: [id number] DOB: [birth year] Date of Office Visit: 04/24/2024  Referring provider: Micheal [name] ORN, MD Primary care provider: Micheal [name] ORN, MD  Chief Complaint: No chief complaint on file.  History of Present Illness: I had the pleasure of seeing Bradley [name] for a follow up visit at the [facility] on 04/24/2024. He is a 28 y.o. male, who is being followed for allergic rhinitis, asthma, alpha-gal allergy, adverse food reaction, GERD. His previous allergy office visit was on 12/25/2023 with Dr. Luke. Today is a regular follow up visit.  Discussed the use of AI scribe software for clinical note transcription with the patient, who gave verbal consent to proceed.  History of Present Illness            ***  Assessment and Plan: Bradley [name] is a 28 y.o. male with: Seasonal allergic rhinitis due to pollen Allergic rhinitis due to animal dander Allergic rhinitis due to dust mite Allergic rhinitis due to mold Allergy to cockroaches Past history - 2019 blood work was positive to dust mites, cat, dog, grass, tree, borderline to ragweed, cockroach. Started AIT on 02/14/2018 (Mite-Cockroach-Ragweed & Pollen-Pet). Stopped AIT in 2021. Can't take Singulair .  Interim history - controlled.  Use over the counter antihistamines such as Zyrtec (cetirizine), Claritin (loratadine), Allegra (fexofenadine ), or Xyzal  (levocetirizine) daily as needed. May take twice a day during allergy flares. May switch antihistamines every few months. Use Flonase  (fluticasone ) nasal spray 1-2 sprays per nostril once a day as needed for nasal congestion.  Nasal saline spray (i.e., Simply Saline) or nasal saline lavage (i.e., NeilMed) is recommended as needed and prior to medicated nasal sprays.   Moderate persistent asthma without complication Reportedly lost albuterol  inhalers and the reasons why requesting frequent refills.  Uses albuterol  inhaler  approximately three times a week, with occasional increased use. No emergency care or prednisone  required for exacerbations. Compliance with Breo for the past two to three weeks only which is helping. Experiences exercise-induced asthma and possible exacerbation due to GERD. Today's spirometry was normal.  Daily controller medication(s): continue Breo 200mcg 1 puff once a day and rinse mouth after each use.  Stressed importance of daily use.  May use albuterol  rescue inhaler 2 puffs or nebulizer every 4 to 6 hours as needed for shortness of breath, chest tightness, coughing, and wheezing. May use albuterol  rescue inhaler 2 puffs 5 to 15 minutes prior to strenuous physical activities. Monitor frequency of use - if you need to use it more than twice per week on a consistent basis let us  know.  I'm also sending in a new rescue inhaler and see if it's covered - Airsupra . May use Airsupra  rescue inhaler 2 puffs every 4 to 6 hours as needed for shortness of breath, chest tightness, coughing, and wheezing. Do not use more than 12 puffs in 24 hours. May use Airsupra  rescue inhaler 2 puffs 5 to 15 minutes prior to strenuous physical activities. Rinse mouth after each use.  Coupon given.  Monitor frequency of use - if you need to use it more than twice per week on a consistent basis let us  know.    Allergy to alpha-gal Past history -  2025 bloodwork alpha gal still positive. Interim history - multiple tick bites recently. Avoid mammalian meat.  Will recheck alpha gal next year.  For mild symptoms you can take over the counter antihistamines such as Benadryl and monitor symptoms closely. If symptoms worsen  or if you have severe symptoms including breathing issues, throat closure, significant swelling, whole body hives, severe diarrhea and vomiting, lightheadedness then inject epinephrine  and seek immediate medical care afterwards.   Other adverse food reactions, not elsewhere classified, subsequent  encounter Past history - 2024 bloodwork shrimp borderline positive. No issues with oysters, lobsters, crab. Interim history - Patient is interested in pursuing shrimp challenge.  Return for in office shrimp challenge.    GERD Not well controlled. Follows with GI.  Continue lifestyle and dietary modifications. Take omeprazole  20mg  in the morning. Nothing to eat or drink for 30 min afterwards. Discussed increasing to 40mg  as it seems to flare his asthma but would like to stay at 20mg  for now.    Tick bite of right back wall of thorax, initial encounter Recent tick bite on the back, red and raised but not infected. Removed the tick the same day. No history of Lyme disease. Doxycycline  prescribed as a precautionary measure. Prescribe doxycycline  100 mg twice daily for 10 days as a precautionary measure. Assessment and Plan              No follow-ups on file.  No orders of the defined types were placed in this encounter.  Lab Orders  No laboratory test(s) ordered today    Diagnostics: Spirometry:  Tracings reviewed. His effort: {Blank single:19197::Good reproducible efforts.,It was hard to get consistent efforts and there is a question as to whether this reflects a maximal maneuver.,Poor effort, data can not be interpreted.} FVC: ***L FEV1: ***L, ***% predicted FEV1/FVC ratio: ***% Interpretation: {Blank single:19197::Spirometry consistent with mild obstructive disease,Spirometry consistent with moderate obstructive disease,Spirometry consistent with severe obstructive disease,Spirometry consistent with possible restrictive disease,Spirometry consistent with mixed obstructive and restrictive disease,Spirometry uninterpretable due to technique,Spirometry consistent with normal pattern,No overt abnormalities noted given today's efforts}.  Please see scanned spirometry results for details.  Skin Testing: {Blank single:19197::Select foods,Environmental allergy  panel,Environmental allergy panel and select foods,Food allergy panel,None,Deferred due to recent antihistamines use}. *** Results discussed with patient/family.   Medication List:  Current Outpatient Medications  Medication Sig Dispense Refill  . albuterol  (PROVENTIL ) (2.5 MG/3ML) 0.083% nebulizer solution Take 3 mLs (2.5 mg total) by nebulization every 4 (four) hours as needed for wheezing or shortness of breath (coughing fits). 75 mL 1  . albuterol  (VENTOLIN  HFA) 108 (90 Base) MCG/ACT inhaler Inhale 2 puffs into the lungs every 4 (four) hours as needed for wheezing or shortness of breath (coughing fits). 36 g 1  . Albuterol -Budesonide (AIRSUPRA ) 90-80 MCG/ACT AERO Inhale 2 puffs into the lungs every 4 (four) hours as needed (coughing, wheezing, chest tightness). Do not exceed 12 puffs in 24 hours. 10.7 g 2  . citalopram  (CELEXA ) 20 MG tablet TAKE 1 TABLET(20 MG) BY MOUTH DAILY 90 tablet 0  . clonazePAM  (KLONOPIN ) 0.5 MG tablet TAKE 1 TABLET(0.5 MG) BY MOUTH TWICE DAILY AS NEEDED FOR ANXIETY 30 tablet 1  . cycloSPORINE (RESTASIS OP) Apply to eye.    . doxycycline  (VIBRAMYCIN ) 100 MG capsule Take 1 capsule (100 mg total) by mouth 2 (two) times daily. 20 capsule 0  . EPINEPHrine  (AUVI-Q ) 0.3 mg/0.3 mL IJ SOAJ injection Inject 0.3 mg into the muscle as needed for anaphylaxis. 2 each 1  . fluticasone  (FLONASE ) 50 MCG/ACT nasal spray Place 2 sprays into both nostrils in the morning and at bedtime. 16 g 6  . fluticasone  furoate-vilanterol (BREO ELLIPTA ) 200-25 MCG/ACT AEPB Inhale 1 puff into the lungs daily. Rinse your mouth after each use. 60  each 3  . levocetirizine (XYZAL ) 5 MG tablet Take 1 tablet (5 mg total) by mouth every evening. 30 tablet 5  . predniSONE  (STERAPRED UNI-PAK 21 TAB) 10 MG (21) TBPK tablet Take by mouth daily. Take 6 tabs by mouth daily  for 1 days, then 5 tabs for 1 days, then 4 tabs for 1 days, then 3 tabs for 1 days, 2 tabs for 1 days, then 1 tab by mouth daily for 1  days 21 tablet 0  . sildenafil  (VIAGRA ) 100 MG tablet TAKE 1/2 TO 1 TABLET(50 TO 100 MG) BY MOUTH DAILY AS NEEDED FOR ERECTILE DYSFUNCTION 10 tablet 1   No current facility-administered medications for this visit.   Allergies: Allergies  Allergen Reactions  . Shellfish Allergy Anaphylaxis  . Hydrocodone Itching    Pt states he feels itchy after taking, but it doesn't preclude him taking hydrocodone if needed About 2-3 years ago.  It was the first time he took hydrocodone.  . Other Diarrhea    Slightly allergic to red meat since tick bite.SABRASABRANot anaphylaxis, but GI sensitivity.   I reviewed his past medical history, social history, family history, and environmental history and no significant changes have been reported from his previous visit.  Review of Systems  Constitutional:  Negative for appetite change, chills, fever and unexpected weight change.  HENT:  Negative for congestion and rhinorrhea.   Eyes:  Negative for itching.  Respiratory:  Negative for cough, chest tightness, shortness of breath and wheezing.   Gastrointestinal:  Negative for abdominal pain.  Skin:  Positive for rash.  Allergic/Immunologic: Positive for environmental allergies and food allergies.  Neurological:  Negative for headaches.    Objective: There were no vitals taken for this visit. There is no height or weight on file to calculate BMI. Physical Exam Vitals and nursing note reviewed.  Constitutional:      Appearance: Normal appearance. He is well-developed. He is obese.  HENT:     Head: Normocephalic and atraumatic.     Right Ear: Tympanic membrane and external ear normal.     Left Ear: Tympanic membrane and external ear normal.     Nose: Nose normal.     Mouth/Throat:     Mouth: Mucous membranes are moist.     Pharynx: Oropharynx is clear.  Eyes:     Conjunctiva/sclera: Conjunctivae normal.  Cardiovascular:     Rate and Rhythm: Normal rate and regular rhythm.     Heart sounds: Normal heart  sounds. No murmur heard. Pulmonary:     Effort: Pulmonary effort is normal.     Breath sounds: Normal breath sounds. No wheezing, rhonchi or rales.  Musculoskeletal:     Cervical back: Neck supple.  Skin:    General: Skin is warm.     Findings: Rash present.     Comments: Midback - dime size erythematous raise rash with central punctuate mark.  Neurological:     Mental Status: He is alert and oriented to person, place, and time.  Psychiatric:        Behavior: Behavior normal.   Previous notes and tests were reviewed. The plan was reviewed with the patient/family, and all questions/concerned were addressed.  It was my pleasure to see Tripp today and participate in his care. Please feel free to contact me with any questions or concerns.  Sincerely,  Orlan Cramp, DO Allergy & Immunology  [facility]  [place] office: [phone] [facility] office: [phone]

## 2024-04-24 ENCOUNTER — Ambulatory Visit: Admitting: Allergy

## 2024-04-24 ENCOUNTER — Encounter: Payer: Self-pay | Admitting: Allergy

## 2024-04-24 VITALS — BP 108/72 | HR 82 | Temp 98.3°F | Resp 16 | Wt 369.2 lb

## 2024-04-24 DIAGNOSIS — J3089 Other allergic rhinitis: Secondary | ICD-10-CM

## 2024-04-24 DIAGNOSIS — J301 Allergic rhinitis due to pollen: Secondary | ICD-10-CM | POA: Diagnosis not present

## 2024-04-24 DIAGNOSIS — J4540 Moderate persistent asthma, uncomplicated: Secondary | ICD-10-CM | POA: Diagnosis not present

## 2024-04-24 DIAGNOSIS — Z91018 Allergy to other foods: Secondary | ICD-10-CM

## 2024-04-24 DIAGNOSIS — K219 Gastro-esophageal reflux disease without esophagitis: Secondary | ICD-10-CM

## 2024-04-24 DIAGNOSIS — Z91038 Other insect allergy status: Secondary | ICD-10-CM

## 2024-04-24 DIAGNOSIS — J3081 Allergic rhinitis due to animal (cat) (dog) hair and dander: Secondary | ICD-10-CM | POA: Diagnosis not present

## 2024-04-24 MED ORDER — FLUTICASONE FUROATE-VILANTEROL 200-25 MCG/ACT IN AEPB: 1.0000 | INHALATION_SPRAY | Freq: Every day | RESPIRATORY_TRACT | 5 refills | Status: AC

## 2024-04-24 NOTE — Patient Instructions (Addendum)
 Asthma Today's breathing test was normal today.  Daily controller medication(s): continue Breo 200mcg 1 puff once a day and rinse mouth after each use.  May use albuterol  rescue inhaler 2 puffs or nebulizer every 4 to 6 hours as needed for shortness of breath, chest tightness, coughing, and wheezing. May use albuterol  rescue inhaler 2 puffs 5 to 15 minutes prior to strenuous physical activities. Monitor frequency of use - if you need to use it more than twice per week on a consistent basis let us  know.  Breathing control goals:  Full participation in all desired activities (may need albuterol  before activity) Albuterol  use two times or less a week on average (not counting use with activity) Cough interfering with sleep two times or less a month Oral steroids no more than once a year No hospitalizations   Food allergy Avoid mammalian meat.  Get bloodwork 1 month before the next visit.  For mild symptoms you can take over the counter antihistamines (zyrtec 10mg  to 20mg ) and monitor symptoms closely.  If symptoms worsen or if you have severe symptoms including breathing issues, throat closure, significant swelling, whole body hives, severe diarrhea and vomiting, lightheadedness then use epinephrine  and seek immediate medical care afterwards. Emergency action plan in place.    Return for shrimp food challenge: You must be off antihistamines for 3-5 days before. Must be in good health and not ill. No vaccines/injections/antibiotics within the past 7 days. Plan on being in the office for 2-3 hours and must bring in the food you want to do the oral challenge for - 1 serving size of lightly seasoned fully cooked shrimp.   Environmental allergies Use over the counter antihistamines such as Zyrtec (cetirizine), Claritin (loratadine), Allegra (fexofenadine ), or Xyzal  (levocetirizine) daily as needed. May take twice a day during allergy flares. May switch antihistamines every few months. Use Flonase   (fluticasone ) nasal spray 1-2 sprays per nostril once a day as needed for nasal congestion.  Nasal saline spray (i.e., Simply Saline) or nasal saline lavage (i.e., NeilMed) is recommended as needed and prior to medicated nasal sprays.  Heartburn: Continue lifestyle and dietary modifications. Take omeprazole  20mg  in the morning. Nothing to eat or drink for 30 min afterwards.   Follow up in 6 months or sooner if needed.   Alpha-gal and Red Meat Allergy   Overview An allergy to "alpha-gal" refers to having a severe and potentially life-threatening allergy to a carbohydrate molecule called galactose-alpha-1,3-galactose that is found in most mammalian or "red meat". Unlike other food allergies which typically occur within minutes of ingestion, symptoms from eating red meat such as pork, lamb or beef may be delayed, occurring 3-8 hours after eating. Most food allergies are directed against a protein molecule, but alpha-gal is unusual because it is a carbohydrate, and a delay in its absorption may explain the delay in symptoms.  Helpful website: [employer]ikerFestival.is  What are the symptoms of an alpha-gal allergy? As with other food allergies, signs or symptoms of an allergy to alpha-gal may include: Hives and itching  Swelling of your lips, face or eyelids  Shortness of breath, cough or wheezing  Abdominal pain, nausea, diarrhea or vomiting The most severe reaction, anaphylaxis, can present as a combination of several of these symptoms, may include low blood pressure, and is potentially fatal.  Because these symptoms are delayed, you may only wake up with them in the middle of the night after an evening meal.  How is an alpha-gal allergy diagnosed? Diagnosis of this allergy  starts with your allergist taking an appropriate history and physical examination. Because the onset is usually quite delayed, it can be hard to associate the symptoms with eating red meat many hours previously. Triggers  include any red meat - including beef, pork, lamb or even horse products. It may occur after eating hotdogs and hamburgers. In very rare cases the reaction may extend to milk or dairy proteins and gelatin.  Your allergist may recommend testing that includes skin tests to the relevant animal proteins and blood tests which measure the levels of a specific immunoglobulin E (IgE) antibody, to mammalian meats. An investigational blood test, IgE against alpha-gal itself, may also aid in the diagnosis.  How is an alpha-gal allergy treated? Immediate symptoms such as hives or shortness of breath are treated the same as any other food allergy - in an urgent care setting with anti-histamines, epinephrine  and other medications. Prevention long-term involves avoidance of all red meat in sensitized individuals. You may be advised to carry an epinephrine  auto-injector, to be used in case of subsequent accidental exposures and reaction. These measures do not necessarily mean switching to a full vegetarian diet, since poultry and fish can be consumed and do not cause similar reactions. As with other food allergies, there is the possibility that over time the sensitivity diminishes - although these changes may take many years to become apparent.  How do you become allergic to alpha-gal? Alpha-gal is a molecule carried in the saliva of the [place] tick and other potential arthropods typically after feeding on mammalian blood. People that are bitten by the tick, especially those that are bitten repeatedly, are at risk of becoming sensitized and producing the IgE necessary to then cause allergic reactions. Interestingly, allergic reactions may occur to red meat, to subsequent tick bites, and even to medications that contain alpha-gal. Cetuximab is a cancer medication that contains alpha-gal, and people who have had allergic reactions to this medication (these are typically immediate reactions, because it is infused  intravenously) have a higher risk for red meat allergy and are likely to have been bitten by ticks in the past. As might be expected, the incidence of tick bites is much higher in the [place] the traditional habitat for the tick. However, cases are now increasingly reported in the [place] and [place]. And it is a phenomenon that has been observed worldwide, with different ticks responsible for similar cases of red meat allergy in many other countries such as [place], [place] and [place].  The discovery of this peculiar allergy has allowed researchers to correlate tick bites with many cases of anaphylaxis that would previously have been classified as 'idiopathic', or of unknown cause. Also, while it was originally thought that the [employer] tick had to feast on mammalian blood in order to carry the alpha-gal molecule, more recent research has shown that it may carry this molecule and be capable of sensitizing humans independently.  How do you prevent an alpha-gal allergy? Because this allergy is predominantly tick born, you are more likely at risk if you often go outdoors in wooded areas for activities such as hiking, fishing or hunting. The key strategy is to prevent tick bites. This may include wearing long sleeved shirts or pants, using appropriate insect repellants, and surveying for ticks after spending time outdoors. Any observed ticks should be removed carefully by cleaning the site with rubbing alcohol, then using tweezers to pull the tick's head up carefully from the skin using steady  pressure. Clean your hands and the site one more time and make sure not to crush the tick between your fingers.

## 2024-05-01 ENCOUNTER — Other Ambulatory Visit: Payer: Self-pay | Admitting: Family Medicine

## 2024-05-02 ENCOUNTER — Encounter: Payer: Self-pay | Admitting: Family Medicine

## 2024-05-02 MED ORDER — SILDENAFIL CITRATE 100 MG PO TABS: ORAL_TABLET | ORAL | 1 refills | Status: DC

## 2024-05-13 ENCOUNTER — Other Ambulatory Visit: Payer: Self-pay

## 2024-05-13 ENCOUNTER — Ambulatory Visit: Admitting: Family Medicine

## 2024-05-13 ENCOUNTER — Ambulatory Visit: Payer: Self-pay

## 2024-05-13 ENCOUNTER — Encounter: Payer: Self-pay | Admitting: Family Medicine

## 2024-05-13 VITALS — BP 120/88 | HR 95 | Temp 98.6°F | Ht 77.0 in | Wt 361.9 lb

## 2024-05-13 DIAGNOSIS — L0390 Cellulitis, unspecified: Secondary | ICD-10-CM

## 2024-05-13 DIAGNOSIS — W57XXXA Bitten or stung by nonvenomous insect and other nonvenomous arthropods, initial encounter: Secondary | ICD-10-CM

## 2024-05-13 DIAGNOSIS — S20362A Insect bite (nonvenomous) of left front wall of thorax, initial encounter: Secondary | ICD-10-CM | POA: Diagnosis not present

## 2024-05-13 DIAGNOSIS — H6693 Otitis media, unspecified, bilateral: Secondary | ICD-10-CM

## 2024-05-13 MED ORDER — ALBUTEROL SULFATE HFA 108 (90 BASE) MCG/ACT IN AERS: 2.0000 | INHALATION_SPRAY | RESPIRATORY_TRACT | 1 refills | Status: DC | PRN

## 2024-05-13 MED ORDER — DOXYCYCLINE HYCLATE 100 MG PO TABS: 100.0000 mg | ORAL_TABLET | Freq: Two times a day (BID) | ORAL | 0 refills | Status: AC

## 2024-05-13 NOTE — Telephone Encounter (Signed)
 FYI Only or Action Required?: FYI only for provider.  Patient was last seen in primary care on 02/04/2024 by Micheal Wolm ORN, MD.  Called Nurse Triage reporting Insect Bite.  Symptoms began several days ago.  Interventions attempted: OTC medications: antibiotic ointment, hydrocortisone .  Symptoms are: gradually worsening.  Triage Disposition: See Physician Within 24 Hours  Patient/caregiver understands and will follow disposition?: Yes  Copied from CRM #[id number]. Topic: Clinical - Red Word Triage >> May 13, 2024 10:55 AM Dedra B wrote: [facility] that prompted transfer to Nurse Triage: Pt was bitten by something on the L side of his chest 4-5 days ago. He described the area as a big red circle. Concerned about lyme disease. Warm transfer to [facility]. Reason for Disposition  [1] Red or very tender (to touch) area AND [2] started over 24 hours after the bite  Answer Assessment - Initial Assessment Questions 1. TYPE of INSECT: What type of insect was it?      unknown 2. ONSET: When did you get bitten?      4-5 3. LOCATION: Where is the insect bite located?      Left side chest 4. REDNESS: Is the area red or pink? If Yes, ask: What size is the area of redness? (inches or cm). When did the redness start?     Redness surrounding about size half dollar 5. PAIN: Is there any pain? If Yes, ask: How bad is the pain? (Scale 0-10; or none, mild, moderate, severe)     Tender to touch 6. ITCHING: Does it itch? If Yes, ask: How bad is the itch?      denies 7. SWELLING: How big is the swelling? (e.g., inches, cm, or compare to coins)     yes 8. OTHER SYMPTOMS: Do you have any other symptoms?  (e.g., difficulty breathing, fever, hives)     Tired. Recovered from uri last week. He did remove one tick from different area about one month ago.  Protocols used: Insect Bite-A-AH

## 2024-05-13 NOTE — Progress Notes (Signed)
   Acute Office Visit  Subjective:     Patient ID: Bradley [name], male    DOB: [birth year], 28 y.o.   MRN: [id number]  Chief Complaint  Patient presents with   Insect Bite    Patient complains of redness noted along site of insect bite on the left breast/chest area x4-5 days, has felt hot temperature not taken, fatigue, muscle aches, diarrhea, previous tick bites 6 months ago    Pt states that he noticed a bug bite on the left chest about 4-5 days ago, states that since then he is having body aches, malaise, cold chills, excessive sweating, a scratchy throat, some diarrhea. States that his girlfriend was ill about 2 weeks ago. States that he didn't see the bug itself but thinks he might have scratched it off maybe.      Review of Systems  All other systems reviewed and are negative.       Objective:    BP 120/88   Pulse 95   Temp 98.6 F (37 C) (Oral)   Ht 6' 5 (1.956 m)   Wt (!) 361 lb 14.4 oz (164.2 kg)   SpO2 98%   BMI 42.92 kg/m    Physical Exam Vitals reviewed.  Constitutional:      Appearance: Normal appearance. He is morbidly obese. He is diaphoretic.  HENT:     Right Ear: Tympanic membrane is erythematous.     Left Ear: Tympanic membrane is erythematous.     Nose: Congestion present.     Mouth/Throat:     Mouth: Mucous membranes are moist.     Pharynx: No posterior oropharyngeal erythema.  Eyes:     Conjunctiva/sclera: Conjunctivae normal.  Cardiovascular:     Rate and Rhythm: Normal rate and regular rhythm.     Heart sounds: Normal heart sounds. No murmur heard. Pulmonary:     Effort: Pulmonary effort is normal.     Breath sounds: No stridor. Wheezing (RLL inspiratory wheezing) present. No rhonchi or rales.  Skin:    Comments: See photos  Neurological:     Mental Status: He is alert and oriented to person, place, and time.     No results found for any visits on 05/13/24.      Assessment & Plan:   Problem List Items Addressed This Visit    None Visit Diagnoses       Cellulitis of skin    -  Primary   Relevant Medications   doxycycline  (VIBRA -TABS) 100 MG tablet     Insect bite of left front wall of thorax, initial encounter       Relevant Orders   Lyme Disease Serology w/Reflex     Patient has BL ear infections as well as a suspected either Lymes or RMSF infection possible. To cover for all possible sources of infection I have rx'd doxycycline  for 10 days, pt states he needs a lymes titer done from previous tick bites as well. Orders placed.   Meds ordered this encounter  Medications   doxycycline  (VIBRA -TABS) 100 MG tablet    Sig: Take 1 tablet (100 mg total) by mouth 2 (two) times daily for 10 days.    Dispense:  20 tablet    Refill:  0    No follow-ups on file.  Heron CHRISTELLA Sharper, MD

## 2024-05-14 ENCOUNTER — Ambulatory Visit: Payer: Self-pay | Admitting: Family Medicine

## 2024-05-14 LAB — LYME DISEASE SEROLOGY W/REFLEX: Lyme Total Antibody EIA: NEGATIVE

## 2024-05-20 ENCOUNTER — Ambulatory Visit (INDEPENDENT_AMBULATORY_CARE_PROVIDER_SITE_OTHER): Admitting: Otolaryngology

## 2024-05-20 ENCOUNTER — Encounter (INDEPENDENT_AMBULATORY_CARE_PROVIDER_SITE_OTHER): Payer: Self-pay | Admitting: Otolaryngology

## 2024-05-20 VITALS — BP 122/80 | HR 79 | Ht >= 80 in | Wt 355.0 lb

## 2024-05-20 DIAGNOSIS — R09A2 Foreign body sensation, throat: Secondary | ICD-10-CM | POA: Diagnosis not present

## 2024-05-20 DIAGNOSIS — K219 Gastro-esophageal reflux disease without esophagitis: Secondary | ICD-10-CM

## 2024-05-20 NOTE — Patient Instructions (Signed)
 I have ordered an imaging study for you to complete prior to your next visit. Please call [facility] Scheduling at [phone] to schedule your imaging if you have not received a call within 24 hours. If you are unable to complete your imaging study prior to your next scheduled visit please call our office to let us  know.

## 2024-05-20 NOTE — Progress Notes (Signed)
 Dear Bradley [name], Here is my assessment for our mutual patient, Bradley [name]. Thank you for allowing me the opportunity to care for your patient. Please do not hesitate to contact me should you have any other questions. Sincerely, Dr. Eldora [name]  Otolaryngology Clinic Note Referring provider: Dr. Micheal HPI:  Bradley [name] is a 28 y.o. male kindly referred by Bradley [name] for evaluation of sore throat  Initial visit (07/2023): He reports that he first had some left sided throat discomfort and FB sensation in his throat starting about a month ago - after he had seafood. He was prescribed augmentin  after being evaluted in the ED, and saw [facility] and had a scope, which was reassuring. He then felt a bit better, and then came down with URI symptoms about a week ago. He wanted to make sure there was no other significant issues and presents here.  Today, he reports some nasal congestion, cough, some URI symptoms and some abdominal discomfort but denies odynophagia, dysphagia, neck masses, ear pain, unintentional weight loss. He reports left sided throat discomfort has resolved but now feels some right sided discomfort.  ------------------------------------------- 10/04/2023: Returns for new complaints/excaerbation Reports some foreign body sensation on the left when he swallows - feels like it is a tonsil stone. Lasted little over a week. Not painful. Does not have reflux regularly but takes omeprazole  PRN. Does not get tonsil stones normally. No systemic symptoms; no URI symptoms Patient otherwise denies: - dysphagia, odynophagia, aspiration episodes or PNA, need for Heimlich, unintentional weight loss - changes in voice, shortness of breath, hemoptysis - slight ear discomfort, neck masses  In addition, he is having some burning sensation in right nostril (points to [facility] area) but no nasal issues otherwise except some congestion. No CRS issues, just some  congestion. --------------------------------------------------------- 10/25/2023 Seen in follow up. He reports that he is doing ok, no pain, just some globus. No dysphagia, odynophagia, ear pain, neck masses. No nasal issues. Nasal discomfort resolved. All the time, nothing makes it beter or worse.  ---------------------------------------------------------- 05/20/2024 Noted FB sensation in throat, recently evaluated by [place] [facility] on 05/15/2024, ongoing for about a year. No pain. No dysphagia or odynophagia. He is on reflux medication. No significant tonsil stones or tonsillar pain. He did get doxycycline  recently. He did have a cyst removed from left neck and wonders if this could be the cause.  Chart review shows he has seen multiple ENT providers for various complaints  Personal or FHx of bleeding dz or anesthesia difficulty: no   Tobacco: denies.  Independent Review of Additional Tests or Records:  Prior ENT notes reviewed including endoscopy notes PCP and ED notes reviewed and summarized above Bradley [name] (09/20/2023): noted right ear ache, Dx with AOM with effusion; Rx: augmentin  Rapid strep 07/31/2023: negative 03/26/2023 Dr. Georgina (ENT) - right nasal pain, intermittent mupirocin  ointment use; Dx: Nasal folliculitis, Rx: Mupirocin  Dr. Ozell 05/13/2024: cellulitis of skin and b/l ear infections; Rx: doxycycline  PMH/Meds/All/SocHx/FamHx/ROS:   Past Medical History:  Diagnosis Date   Allergy to alpha-gal    Anxiety    Chronic headaches    Clavicular fracture    Diverticulosis    History of chickenpox    Mild intermittent asthma 06/07/2018   Shingles    28 years old.   Tubular adenoma of colon    Urticaria 05/18/2020     Past Surgical History:  Procedure Laterality Date   COLONOSCOPY     CYSTECTOMY  01/2019   scrotum area  ESOPHAGEAL MANOMETRY N/A 06/09/2020   Procedure: ESOPHAGEAL MANOMETRY (EM);  Surgeon: Bradley Sandor GAILS, DO;  Location: [facility];  Service:  Gastroenterology;  Laterality: N/A;   ESOPHAGOGASTRODUODENOSCOPY     shoulder surgery Right 08/26/2019   WISDOM TOOTH EXTRACTION      Family History  Problem Relation Age of Onset   Hypertension Mother    Colon polyps Mother        might be mistakrn   Diabetes Father        type II   Migraines Sister    Colon cancer Neg Hx    Esophageal cancer Neg Hx    Rectal cancer Neg Hx    Stomach cancer Neg Hx      Social Connections: Unknown (02/06/2022)   Received from [facility]   Social Network    Social Network: Not on file      Current Outpatient Medications:    albuterol  (PROVENTIL ) (2.5 MG/3ML) 0.083% nebulizer solution, Take 3 mLs (2.5 mg total) by nebulization every 4 (four) hours as needed for wheezing or shortness of breath (coughing fits)., Disp: 75 mL, Rfl: 1   albuterol  (VENTOLIN  HFA) 108 (90 Base) MCG/ACT inhaler, Inhale 2 puffs into the lungs every 4 (four) hours as needed for wheezing or shortness of breath (coughing fits)., Disp: 36 g, Rfl: 1   Albuterol -Budesonide (AIRSUPRA ) 90-80 MCG/ACT AERO, Inhale 2 puffs into the lungs every 4 (four) hours as needed (coughing, wheezing, chest tightness). Do not exceed 12 puffs in 24 hours., Disp: 10.7 g, Rfl: 2   citalopram  (CELEXA ) 20 MG tablet, TAKE 1 TABLET(20 MG) BY MOUTH DAILY, Disp: 90 tablet, Rfl: 0   clonazePAM  (KLONOPIN ) 0.5 MG tablet, TAKE 1 TABLET(0.5 MG) BY MOUTH TWICE DAILY AS NEEDED FOR ANXIETY, Disp: 30 tablet, Rfl: 0   cycloSPORINE (RESTASIS OP), Apply to eye., Disp: , Rfl:    doxycycline  (VIBRA -TABS) 100 MG tablet, Take 1 tablet (100 mg total) by mouth 2 (two) times daily for 10 days., Disp: 20 tablet, Rfl: 0   EPINEPHrine  (AUVI-Q ) 0.3 mg/0.3 mL IJ SOAJ injection, Inject 0.3 mg into the muscle as needed for anaphylaxis., Disp: 2 each, Rfl: 1   fluticasone  (FLONASE ) 50 MCG/ACT nasal spray, Place 2 sprays into both nostrils in the morning and at bedtime., Disp: 16 g, Rfl: 6   fluticasone  furoate-vilanterol (BREO  ELLIPTA) 200-25 MCG/ACT AEPB, Inhale 1 puff into the lungs daily. Rinse your mouth after each use., Disp: 60 each, Rfl: 5   levocetirizine (XYZAL ) 5 MG tablet, Take 1 tablet (5 mg total) by mouth every evening., Disp: 30 tablet, Rfl: 5   MIEBO 1.338 GM/ML SOLN, , Disp: , Rfl:    sildenafil  (VIAGRA ) 100 MG tablet, TAKE 1/2 TO 1 TABLET(50 TO 100 MG) BY MOUTH DAILY AS NEEDED FOR ERECTILE DYSFUNCTION, Disp: 10 tablet, Rfl: 1   Physical Exam:   There were no vitals taken for this visit.  Salient findings:  CN II-XII intact  Bilateral EAC clear and TM intact with well pneumatized middle ear spaces Anterior rhinoscopy: Septum relatively midline; bilateral inferior turbinates without hypertrophy, no significant mucosal edema. No lesions of oral cavity/oropharynx; no tonsil stones; dentition fair -- the area he points to is around the hyoid bone it appears No obviously palpable neck masses/lymphadenopathy/thyromegaly No respiratory distress or stridor; voice quality class I; TFL was indicated to better evaluate the proximal airway, given the patient's history and exam findings, and is detailed below.  Seprately Identifiable Procedures:   Procedure Note Pre-procedure diagnosis:  Globus sensation Post-procedure diagnosis: Same Procedure: Transnasal Fiberoptic Laryngoscopy, CPT 31575 - Mod 25 Indication: see above Complications: None apparent EBL: 0 mL  The procedure was undertaken to further evaluate the patient's complaint above, with mirror exam inadequate for appropriate examination due to gag reflex and poor patient tolerance  Procedure:  Patient was identified as correct patient. Verbal consent was obtained. The nose was sprayed with oxymetazoline and 4% lidocaine . The The flexible laryngoscope was passed through the nose to view the nasal cavity, pharynx (oropharynx, hypopharynx) and larynx.  The larynx was examined at rest and during multiple phonatory tasks. Documentation was obtained and  reviewed with patient. The scope was removed. The patient tolerated the procedure well.  Findings: The nasal cavity and nasopharynx did not reveal any masses or lesions, mucosa appeared to be without obvious lesions. The tongue base, pharyngeal walls, piriform sinuses, vallecula, epiglottis and postcricoid region are normal in appearance - the area he points to is around left GT sulcus area(?); no masses noted there. The visualized portion of the subglottis and proximal trachea is widely patent. The vocal folds are mobile bilaterally, right perhaps slightly hypomobile. There are no lesions on the free edge of the vocal folds nor elsewhere in the larynx worrisome for malignancy.    Electronically signed by: Bradley KATHEE Blanch, MD 05/20/2024 1:19 PM   Impression & Plans:  Yaron Grasse is a 28 y.o. male with Foreign body sensation in left throat and some discomfort Throat discomfort - resolved Right nasal discomfort - resolved GERD - stable Prior endo reassuring, discomfort resolved. Just some FB sensation.This is persistent He does have GERD and this is not exacerbated on current medication  Given multiple visits for this,we discussed CT to rule out any occult lesion and he agrees  - f/u 6 weeks with CT  See below regarding exact medications prescribed this encounter including dosages and route: No orders of the defined types were placed in this encounter.  Thank you for allowing me the opportunity to care for your patient. Please do not hesitate to contact me should you have any other questions.  Sincerely, Bradley Blanch, MD Otolaryngologist (ENT), [facility] Phone: [phone] Fax: [phone]  05/20/2024, 1:02 PM   I have personally spent 30 minutes involved in face-to-face and non-face-to-face activities for this patient on the day of the visit.  Professional time spent excludes any procedures performed but includes the following activities, in addition to those noted  in the documentation: preparing to see the patient (review of outside documentation and results), performing a medically appropriate examination, counseling, documenting in the electronic health record

## 2024-05-23 ENCOUNTER — Encounter: Payer: Self-pay | Admitting: Radiology

## 2024-05-23 ENCOUNTER — Ambulatory Visit
Admission: RE | Admit: 2024-05-23 | Discharge: 2024-05-23 | Disposition: A | Discharge status: Home / Self Care | Source: Ambulatory Visit | Attending: Otolaryngology | Admitting: Otolaryngology

## 2024-05-23 DIAGNOSIS — R09A2 Foreign body sensation, throat: Secondary | ICD-10-CM

## 2024-05-23 DIAGNOSIS — K219 Gastro-esophageal reflux disease without esophagitis: Secondary | ICD-10-CM

## 2024-05-23 MED ORDER — IOPAMIDOL (ISOVUE-300) INJECTION 61%
75.0000 mL | Freq: Once | INTRAVENOUS | Status: AC | PRN
  Administered 2024-05-23: 75 mL via INTRAVENOUS

## 2024-06-03 ENCOUNTER — Other Ambulatory Visit: Payer: Self-pay | Admitting: Family Medicine

## 2024-07-01 ENCOUNTER — Ambulatory Visit (INDEPENDENT_AMBULATORY_CARE_PROVIDER_SITE_OTHER): Admitting: Otolaryngology

## 2024-07-01 ENCOUNTER — Encounter (INDEPENDENT_AMBULATORY_CARE_PROVIDER_SITE_OTHER): Payer: Self-pay | Admitting: Otolaryngology

## 2024-07-01 VITALS — BP 142/82 | HR 76 | Ht >= 80 in | Wt 355.0 lb

## 2024-07-01 DIAGNOSIS — R09A2 Foreign body sensation, throat: Secondary | ICD-10-CM | POA: Diagnosis not present

## 2024-07-01 DIAGNOSIS — K219 Gastro-esophageal reflux disease without esophagitis: Secondary | ICD-10-CM

## 2024-07-01 NOTE — Progress Notes (Signed)
 Dear Bradley [name], Here is my assessment for our mutual patient, Bradley [name]. Thank you for allowing me the opportunity to care for your patient. Please Bradley [name] not hesitate to contact me should you have any other questions. Sincerely, Dr. Eldora [name]  Otolaryngology Clinic Note Referring provider: Dr. Micheal HPI:  Bradley [name] is a 28 y.o. male kindly referred by Bradley [name] for evaluation of sore throat  Initial visit (07/2023): He reports that he first had some left sided throat discomfort and FB sensation in his throat starting about a month ago - after he had seafood. He was prescribed augmentin  after being evaluted in the ED, and saw [name] and had a scope, which was reassuring. He then felt a bit better, and then came down with URI symptoms about a week ago. He wanted to make sure there was no other significant issues and presents here.  Today, he reports some nasal congestion, cough, some URI symptoms and some abdominal discomfort but denies odynophagia, dysphagia, neck masses, ear pain, unintentional weight loss. He reports left sided throat discomfort has resolved but now feels some right sided discomfort.  ------------------------------------------- 10/04/2023: Returns for new complaints/excaerbation Reports some foreign body sensation on the left when he swallows - feels like it is a tonsil stone. Lasted little over a week. Not painful. Does not have reflux regularly but takes omeprazole  PRN. Does not get tonsil stones normally. No systemic symptoms; no URI symptoms Patient otherwise denies: - dysphagia, odynophagia, aspiration episodes or PNA, need for Heimlich, unintentional weight loss - changes in voice, shortness of breath, hemoptysis - slight ear discomfort, neck masses  In addition, he is having some burning sensation in right nostril (points to Bradley [name] area) but no nasal issues otherwise except some congestion. No CRS issues, just some  congestion. --------------------------------------------------------- 10/25/2023 Seen in follow up. He reports that he is doing ok, no pain, just some globus. No dysphagia, odynophagia, ear pain, neck masses. No nasal issues. Nasal discomfort resolved. All the time, nothing makes it beter or worse.  ---------------------------------------------------------- 05/20/2024 Noted FB sensation in throat, recently evaluated by Bradley [name] on 05/15/2024, ongoing for about a year. No pain. No dysphagia or odynophagia. He is on reflux medication. No significant tonsil stones or tonsillar pain. He did get doxycycline  recently. He did have a cyst removed from left neck and wonders if this could be the cause. --------------------------------------------------------- 07/01/2024 Seen in follow up. Discussed CT. Overall doing better, still with some globus. No dysphagia, odynophagia, ear pain, neck masses. Having some GERD sx perhaps.   Chart review shows he has seen multiple [name] providers for various complaints  Personal or FHx of bleeding dz or anesthesia difficulty: no   Tobacco: denies.  Independent Review of Additional Tests or Records:  Prior [name] notes reviewed including [name] notes PCP and ED notes reviewed and summarized above Bradley [name] (09/20/2023): noted right ear ache, Dx with AOM with effusion; Rx: augmentin  Rapid strep 07/31/2023: negative 03/26/2023 Bradley [name] ([name]) - right nasal pain, intermittent mupirocin  ointment use; Dx: Nasal folliculitis, Rx: Mupirocin  Bradley [name] 05/13/2024: cellulitis of skin and b/l ear infections; Rx: doxycycline  CT Neck 05/23/2024 independently interpreted, agree with read - no obvious enhancing lesions, necrotic LAD or foreign bodies.  PMH/Meds/All/SocHx/FamHx/ROS:   Past Medical History:  Diagnosis Date   Allergy to alpha-gal    Anxiety    Chronic headaches    Clavicular fracture    Diverticulosis    History of chickenpox    Mild intermittent asthma  06/07/2018  Shingles    28 years old.   Tubular adenoma of colon    Urticaria 05/18/2020     Past Surgical History:  Procedure Laterality Date   COLONOSCOPY     CYSTECTOMY  01/2019   scrotum area    ESOPHAGEAL MANOMETRY N/A 06/09/2020   Procedure: ESOPHAGEAL MANOMETRY (EM);  Surgeon: Bradley Sandor GAILS, Bradley [name];  Location: Bradley [name];  Service: Gastroenterology;  Laterality: N/A;   ESOPHAGOGASTRODUODENOSCOPY     shoulder surgery Right 08/26/2019   WISDOM TOOTH EXTRACTION      Family History  Problem Relation Age of Onset   Hypertension Mother    Colon polyps Mother        might be mistakrn   Diabetes Father        type II   Migraines Sister    Colon cancer Neg Hx    Esophageal cancer Neg Hx    Rectal cancer Neg Hx    Stomach cancer Neg Hx      Social Connections: Unknown (02/06/2022)   Received from [facility]   Social Network    Social Network: Not on file      Current Outpatient Medications:    albuterol  (PROVENTIL ) (2.5 MG/3ML) 0.083% nebulizer solution, Take 3 mLs (2.5 mg total) by nebulization every 4 (four) hours as needed for wheezing or shortness of breath (coughing fits)., Disp: 75 mL, Rfl: 1   albuterol  (VENTOLIN  HFA) 108 (90 Base) MCG/ACT inhaler, Inhale 2 puffs into the lungs every 4 (four) hours as needed for wheezing or shortness of breath (coughing fits)., Disp: 36 g, Rfl: 1   Albuterol -Budesonide (AIRSUPRA ) 90-80 MCG/ACT AERO, Inhale 2 puffs into the lungs every 4 (four) hours as needed (coughing, wheezing, chest tightness). Bradley [name] not exceed 12 puffs in 24 hours., Disp: 10.7 g, Rfl: 2   citalopram  (CELEXA ) 20 MG tablet, TAKE 1 TABLET(20 MG) BY MOUTH DAILY, Disp: 90 tablet, Rfl: 0   clonazePAM  (KLONOPIN ) 0.5 MG tablet, TAKE 1 TABLET(0.5 MG) BY MOUTH TWICE DAILY AS NEEDED FOR ANXIETY, Disp: 30 tablet, Rfl: 1   cycloSPORINE (RESTASIS OP), Apply to eye., Disp: , Rfl:    doxycycline  (VIBRA -TABS) 100 MG tablet, Take 1 tablet (100 mg total) by mouth 2 (two) times  daily for 10 days., Disp: 20 tablet, Rfl: 0   EPINEPHrine  (AUVI-Q ) 0.3 mg/0.3 mL IJ SOAJ injection, Inject 0.3 mg into the muscle as needed for anaphylaxis., Disp: 2 each, Rfl: 1   fluticasone  (FLONASE ) 50 MCG/ACT nasal spray, Place 2 sprays into both nostrils in the morning and at bedtime., Disp: 16 g, Rfl: 6   fluticasone  furoate-vilanterol (BREO ELLIPTA ) 200-25 MCG/ACT AEPB, Inhale 1 puff into the lungs daily. Rinse your mouth after each use., Disp: 60 each, Rfl: 5   levocetirizine (XYZAL ) 5 MG tablet, Take 1 tablet (5 mg total) by mouth every evening., Disp: 30 tablet, Rfl: 5   MIEBO 1.338 GM/ML SOLN, , Disp: , Rfl:    sildenafil  (VIAGRA ) 100 MG tablet, TAKE 1/2 TO 1 TABLET(50 TO 100 MG) BY MOUTH DAILY AS NEEDED FOR ERECTILE DYSFUNCTION, Disp: 10 tablet, Rfl: 1   Physical Exam:   BP (!) 142/100 (BP Location: Left Wrist, Patient Position: Sitting, Cuff Size: Large)   Pulse 76   Ht 6' 8 (2.032 m)   Wt (!) 355 lb (161 kg)   SpO2 95%   BMI 39.00 kg/m   Salient findings:  CN II-XII intact  Bilateral EAC clear and TM intact with well pneumatized middle ear spaces Anterior rhinoscopy:  Septum relatively midline; bilateral inferior turbinates without hypertrophy, no significant mucosal edema. No lesions of oral cavity/oropharynx; no tonsil stones; dentition fair  No obviously palpable neck masses/lymphadenopathy/thyromegaly No respiratory distress or stridor; voice quality class I Seprately Identifiable Procedures:   Procedure Note (prior, not today) Pre-procedure diagnosis:  Globus sensation Post-procedure diagnosis: Same Procedure: Transnasal Fiberoptic Laryngoscopy, CPT 31575 - Mod 25 Indication: see above Complications: None apparent EBL: 0 mL  The procedure was undertaken to further evaluate the patient's complaint above, with mirror exam inadequate for appropriate examination due to gag reflex and poor patient tolerance  Procedure:  Patient was identified as correct patient.  Verbal consent was obtained. The nose was sprayed with oxymetazoline and 4% lidocaine . The The flexible laryngoscope was passed through the nose to view the nasal cavity, pharynx (oropharynx, hypopharynx) and larynx.  The larynx was examined at rest and during multiple phonatory tasks. Documentation was obtained and reviewed with patient. The scope was removed. The patient tolerated the procedure well.  Findings: The nasal cavity and nasopharynx did not reveal any masses or lesions, mucosa appeared to be without obvious lesions. The tongue base, pharyngeal walls, piriform sinuses, vallecula, epiglottis and postcricoid region are normal in appearance - the area he points to is around left GT sulcus area(?); no masses noted there. The visualized portion of the subglottis and proximal trachea is widely patent. The vocal folds are mobile bilaterally, right perhaps slightly hypomobile. There are no lesions on the free edge of the vocal folds nor elsewhere in the larynx worrisome for malignancy.    Electronically signed by: Bradley KATHEE Blanch, MD 07/01/2024 11:26 AM   Impression & Plans:  Dewell Monnier is a 28 y.o. male with  Foreign body sensation in left throat Throat discomfort - resolved Right nasal discomfort - resolved GERD - stable Prior endo reassuring, discomfort resolved. CT without masses. Just some FB sensation.This is persistent. He does have GERD and this is not exacerbated on current medication  Discussed globus pathophys and treatment. He wishes to observe. Return precautions discussed. Recommend reflux gourmet for any GERD sx  See below regarding exact medications prescribed this encounter including dosages and route: No orders of the defined types were placed in this encounter.  Thank you for allowing me the opportunity to care for your patient. Please Bradley [name] not hesitate to contact me should you have any other questions.  Sincerely, Bradley Blanch, MD Otolaryngologist ([name]), [name] Phone: [phone] Fax: [phone]  07/01/2024, 11:26 AM   MDM:  [phone] Complexity/Problems addressed: mod Data complexity: mod - independent CT interpretation - Morbidity: low  - Prescription Drug prescribed or managed: no

## 2024-07-28 ENCOUNTER — Encounter: Payer: Self-pay | Admitting: Radiology

## 2024-07-30 ENCOUNTER — Other Ambulatory Visit: Payer: Self-pay | Admitting: Family Medicine

## 2024-08-18 ENCOUNTER — Other Ambulatory Visit: Payer: Self-pay | Admitting: Allergy

## 2024-09-14 ENCOUNTER — Other Ambulatory Visit: Payer: Self-pay | Admitting: Family Medicine

## 2024-09-15 ENCOUNTER — Encounter: Payer: Self-pay | Admitting: Family Medicine

## 2024-09-15 ENCOUNTER — Ambulatory Visit: Admitting: Family Medicine

## 2024-09-15 ENCOUNTER — Ambulatory Visit: Payer: Self-pay | Admitting: *Deleted

## 2024-09-15 VITALS — BP 118/78 | HR 77 | Temp 98.0°F | Resp 18 | Ht 78.0 in | Wt 371.6 lb

## 2024-09-15 DIAGNOSIS — M62830 Muscle spasm of back: Secondary | ICD-10-CM | POA: Diagnosis not present

## 2024-09-15 DIAGNOSIS — R030 Elevated blood-pressure reading, without diagnosis of hypertension: Secondary | ICD-10-CM | POA: Insufficient documentation

## 2024-09-15 DIAGNOSIS — Z0000 Encounter for general adult medical examination without abnormal findings: Secondary | ICD-10-CM | POA: Insufficient documentation

## 2024-09-15 MED ORDER — METHOCARBAMOL 500 MG PO TABS: 500.0000 mg | ORAL_TABLET | Freq: Three times a day (TID) | ORAL | 0 refills | Status: DC | PRN

## 2024-09-15 MED ORDER — NAPROXEN 500 MG PO TABS: 500.0000 mg | ORAL_TABLET | Freq: Two times a day (BID) | ORAL | 0 refills | Status: DC

## 2024-09-15 NOTE — Progress Notes (Signed)
" ° °  Subjective:    Patient ID: Bradley [name], male    DOB: [birth year], 28 y.o.   MRN: [id number]  HPI R shoulder pain- sxs started ~1 week ago after doing heavy lifting.  Reports he has pain of R upper trap and R lower back.  Worse w/ movement.  Has tried heat, ice, Aleve  sporadically.  Aleve  was temporarily helpful.     Review of Systems For ROS see HPI     Objective:   Physical Exam Vitals reviewed.  Constitutional:      General: He is not in acute distress.    Appearance: Normal appearance. He is obese. He is not ill-appearing.  HENT:     Head: Normocephalic and atraumatic.  Eyes:     Extraocular Movements: Extraocular movements intact.     Conjunctiva/sclera: Conjunctivae normal.  Neck:     Comments: + TTP over R trap spasm Cardiovascular:     Rate and Rhythm: Normal rate and regular rhythm.  Pulmonary:     Effort: Pulmonary effort is normal. No respiratory distress.  Musculoskeletal:     Cervical back: Neck supple.  Lymphadenopathy:     Cervical: No cervical adenopathy.  Skin:    General: Skin is warm and dry.     Findings: No bruising or erythema.  Neurological:     General: No focal deficit present.     Mental Status: He is alert and oriented to person, place, and time.  Psychiatric:        Mood and Affect: Mood normal.        Behavior: Behavior normal.        Thought Content: Thought content normal.           Assessment & Plan:  R trap spasm- new.  Occurred after repetitive heavy lifting.  + TTP and worse w/ certain movements.  Start scheduled NSAIDs.  Muscle relaxer prn.  Reviewed supportive care and red flags that should prompt return.  Pt expressed understanding and is in agreement w/ plan.   "

## 2024-09-15 NOTE — Patient Instructions (Signed)
 Follow up as needed or as scheduled START the Naproxen  twice daily x7 days (w/ food) and then as needed USE the Methocarbamol  for spasm Continue to heat for pain relief Call with any questions or concerns Stay Safe!  Stay Healthy! Happy Holidays!!!

## 2024-09-15 NOTE — Telephone Encounter (Signed)
 FYI Only or Action Required?: FYI only for provider: appointment scheduled on 12/22.  Patient was last seen in primary care on 05/13/2024 by Ozell Heron HERO, MD.  Called Nurse Triage reporting Shoulder Injury.  Symptoms began a week ago.  Interventions attempted: Rest, hydration, or home remedies.  Symptoms are: unchanged.  Triage Disposition: See HCP Within 4 Hours (Or PCP Triage)  Patient/caregiver understands and will follow disposition?: Yes  Copied from CRM #[id number]. Topic: Clinical - Red Word Triage >> Sep 15, 2024  8:02 AM Bradley [name] wrote: Red Word that prompted transfer to Nurse Triage: pinched nerve for a week on top of right shoulder/pain pain with coughing and yawing Reason for Disposition  [1] Shoulder pains with exertion (e.g., walking) AND [2] pain goes away on resting AND [3] not present now  Answer Assessment - Initial Assessment Questions 1. MECHANISM: How did the injury happen?     Heavy lifting 2. ONSET: When did the injury happen? (e.g., minutes, hours ago)      1 week ago 3. APPEARANCE of INJURY: What does the injury look like?      shoulder muscle painful- nagging 4. SEVERITY: Can you move the shoulder normally?      yes 5. SIZE: For cuts, bruises, or swelling, ask: How large is it? (e.g., inches or centimeters;  entire joint)      none 6. PAIN: Is there pain? If Yes, ask: How bad is the pain?  (Scale 0-10; or none, mild, moderate, severe)     With movement 3-4/10  8. OTHER SYMPTOMS: Do you have any other symptoms? (e.g., loss of sensation)     no  Protocols used: Shoulder Injury-A-AH, Shoulder Pain-A-AH

## 2024-09-22 ENCOUNTER — Telehealth (INDEPENDENT_AMBULATORY_CARE_PROVIDER_SITE_OTHER): Admitting: Family Medicine

## 2024-09-22 ENCOUNTER — Encounter: Payer: Self-pay | Admitting: Family Medicine

## 2024-09-22 DIAGNOSIS — R062 Wheezing: Secondary | ICD-10-CM | POA: Diagnosis not present

## 2024-09-22 DIAGNOSIS — J069 Acute upper respiratory infection, unspecified: Secondary | ICD-10-CM

## 2024-09-22 MED ORDER — PREDNISONE 20 MG PO TABS: ORAL_TABLET | ORAL | 0 refills | Status: DC

## 2024-09-22 NOTE — Progress Notes (Signed)
 Patient ID: Bradley [name], male   DOB: March 22, 1996, 28 y.o.   MRN: [id number]   Virtual Visit via Video Note  I connected with Lani Pander on 09/22/2024 at 10:15 AM EST by a video enabled telemedicine application and verified that I am speaking with the correct person using two identifiers.  Location patient: home Location provider:work or home office Persons participating in the virtual visit: patient, provider  I discussed the limitations of evaluation and management by telemedicine and the availability of in person appointments. The patient expressed understanding and agreed to proceed.   HPI:  Bradley [name] is a non-smoker who has history of mild intermittent asthma who is seen with onset few days ago of some sore throat, nasal congestion, possible wheezing.  No body aches.  No documented fever.  He has had some right otalgia but has had otalgia multiple times in the past with respiratory infections.  Denies any significant nausea, vomiting, or diarrhea.  Has rescue inhaler which she has been using at times up to every 2 hours.    ROS: See pertinent positives and negatives per HPI.  Past Medical History:  Diagnosis Date   Allergy to alpha-gal    Anxiety    Chronic headaches    Clavicular fracture    Diverticulosis    History of chickenpox    Mild intermittent asthma 06/07/2018   Shingles    28 years old.   Tubular adenoma of colon    Urticaria 05/18/2020    Past Surgical History:  Procedure Laterality Date   COLONOSCOPY     CYSTECTOMY  01/2019   scrotum area    ESOPHAGEAL MANOMETRY N/A 06/09/2020   Procedure: ESOPHAGEAL MANOMETRY (EM);  Surgeon: San Sandor GAILS, DO;  Location: [facility];  Service: Gastroenterology;  Laterality: N/A;   ESOPHAGOGASTRODUODENOSCOPY     shoulder surgery Right 08/26/2019   WISDOM TOOTH EXTRACTION      Family History  Problem Relation Age of Onset   Hypertension Mother    Colon polyps Mother        might be mistakrn   Diabetes Father         type II   Migraines Sister    Diabetes Maternal Grandmother    Colon cancer Neg Hx    Esophageal cancer Neg Hx    Rectal cancer Neg Hx    Stomach cancer Neg Hx     SOCIAL HX: Non-smoker  Current Medications[1]  EXAM:  VITALS per patient if applicable:  GENERAL: alert, oriented, appears well and in no acute distress  HEENT: atraumatic, conjunttiva clear, no obvious abnormalities on inspection of external nose and ears  NECK: normal movements of the head and neck  LUNGS: on inspection no signs of respiratory distress, breathing rate appears normal, no obvious gross SOB, gasping or wheezing.  No visible retractions  CV: no obvious cyanosis  MS: moves all visible extremities without noticeable abnormality  PSYCH/NEURO: pleasant and cooperative, no obvious depression or anxiety, speech and thought processing grossly intact  ASSESSMENT AND PLAN:  Discussed the following assessment and plan:  Probable viral URI with reactive airway component.  No respiratory distress.  Known history of mild intermittent asthma.  We discussed starting prednisone  20 mg 2 tablets daily for 5 days.  Follow-up promptly for any fever or increased shortness of breath.  Continue albuterol  as rescue inhaler as needed   I discussed the assessment and treatment plan with the patient. The patient was provided an opportunity to ask questions and all were  answered. The patient agreed with the plan and demonstrated an understanding of the instructions.   The patient was advised to call back or seek an in-person evaluation if the symptoms worsen or if the condition fails to improve as anticipated.     Wolm Scarlet, MD      [1]  Current Outpatient Medications:    albuterol  (VENTOLIN  HFA) 108 (90 Base) MCG/ACT inhaler, INHALE 2 PUFFS INTO THE LUNGS EVERY 4 HOURS AS NEEDED FOR WHHEZING OR SHORTNESS OF BREATH(COUGHING), Disp: 36 g, Rfl: 1   citalopram  (CELEXA ) 20 MG tablet, TAKE 1 TABLET(20 MG) BY MOUTH  DAILY, Disp: 90 tablet, Rfl: 0   clonazePAM  (KLONOPIN ) 0.5 MG tablet, TAKE 1 TABLET(0.5 MG) BY MOUTH TWICE DAILY AS NEEDED FOR ANXIETY, Disp: 30 tablet, Rfl: 0   cycloSPORINE (RESTASIS OP), Apply to eye., Disp: , Rfl:    EPINEPHrine  (AUVI-Q ) 0.3 mg/0.3 mL IJ SOAJ injection, Inject 0.3 mg into the muscle as needed for anaphylaxis., Disp: 2 each, Rfl: 1   fluticasone  furoate-vilanterol (BREO ELLIPTA ) 200-25 MCG/ACT AEPB, Inhale 1 puff into the lungs daily. Rinse your mouth after each use., Disp: 60 each, Rfl: 5   levocetirizine (XYZAL ) 5 MG tablet, Take 1 tablet (5 mg total) by mouth every evening., Disp: 30 tablet, Rfl: 5   methocarbamol  (ROBAXIN ) 500 MG tablet, Take 1 tablet (500 mg total) by mouth every 8 (eight) hours as needed for muscle spasms., Disp: 30 tablet, Rfl: 0   MIEBO 1.338 GM/ML SOLN, , Disp: , Rfl:    sildenafil  (VIAGRA ) 100 MG tablet, TAKE 1/2 TO 1 TABLET(50 TO 100 MG) BY MOUTH DAILY AS NEEDED FOR ERECTILE DYSFUNCTION, Disp: 10 tablet, Rfl: 1   albuterol  (PROVENTIL ) (2.5 MG/3ML) 0.083% nebulizer solution, Take 3 mLs (2.5 mg total) by nebulization every 4 (four) hours as needed for wheezing or shortness of breath (coughing fits). (Patient not taking: Reported on 09/15/2024), Disp: 75 mL, Rfl: 1   Albuterol -Budesonide (AIRSUPRA ) 90-80 MCG/ACT AERO, Inhale 2 puffs into the lungs every 4 (four) hours as needed (coughing, wheezing, chest tightness). Do not exceed 12 puffs in 24 hours. (Patient not taking: Reported on 09/15/2024), Disp: 10.7 g, Rfl: 2   fluticasone  (FLONASE ) 50 MCG/ACT nasal spray, Place 2 sprays into both nostrils in the morning and at bedtime. (Patient not taking: Reported on 09/15/2024), Disp: 16 g, Rfl: 6

## 2024-10-06 ENCOUNTER — Encounter: Payer: Self-pay | Admitting: Allergy

## 2024-10-10 ENCOUNTER — Ambulatory Visit: Admitting: Family Medicine

## 2024-10-10 ENCOUNTER — Encounter: Payer: Self-pay | Admitting: Family Medicine

## 2024-10-10 VITALS — BP 118/86 | HR 70 | Temp 97.5°F | Ht 78.0 in | Wt 375.0 lb

## 2024-10-10 DIAGNOSIS — M542 Cervicalgia: Secondary | ICD-10-CM

## 2024-10-10 NOTE — Patient Instructions (Addendum)
 Follow up as needed or as scheduled Drink LOTS of fluids Continue your allergy medications to help control postnasal drip- which irritates the back of the throat Tylenol  or ibuprofen  as needed Continue to monitor your symptoms and let us  know if anything changes or worsens Call with any questions or concerns Hang in there!!!

## 2024-10-10 NOTE — Progress Notes (Signed)
" ° °  Subjective:    Patient ID: Bradley [name], male    DOB: 05/18/1996, 29 y.o.   MRN: [id number]  HPI R neck discomfort- R anterior neck.  Sxs started 5-7 days ago.  pt reports discomfort w/ swallowing or turning his neck.  Described as an 'ache'.  Sxs are not worsening, no visible swelling.  No difficulty w/ swallowing.  Denies PND or congestion.  Some R ear pain and fullness.     Review of Systems For ROS see HPI     Objective:   Physical Exam Vitals reviewed.  Constitutional:      General: He is not in acute distress.    Appearance: Normal appearance. He is obese. He is not ill-appearing.  HENT:     Head: Normocephalic and atraumatic.     Right Ear: Tympanic membrane and ear canal normal.     Left Ear: Tympanic membrane and ear canal normal.     Nose: Congestion present. No rhinorrhea.     Comments: No TTP over frontal or maxillary sinuses    Mouth/Throat:     Mouth: Mucous membranes are moist.     Pharynx: Posterior oropharyngeal erythema (mild erythema w/ copious PND) present. No oropharyngeal exudate.  Eyes:     Extraocular Movements: Extraocular movements intact.     Conjunctiva/sclera: Conjunctivae normal.  Cardiovascular:     Rate and Rhythm: Normal rate and regular rhythm.  Pulmonary:     Effort: Pulmonary effort is normal. No respiratory distress.     Breath sounds: Normal breath sounds. No wheezing or rhonchi.  Musculoskeletal:     Cervical back: Neck supple.  Lymphadenopathy:     Cervical: Cervical adenopathy (small, mildly tender R anterior cervical LN) present.  Skin:    General: Skin is warm and dry.     Findings: No lesion or rash.  Neurological:     General: No focal deficit present.     Mental Status: He is alert and oriented to person, place, and time.  Psychiatric:        Mood and Affect: Mood normal.        Behavior: Behavior normal.        Thought Content: Thought content normal.           Assessment & Plan:  Neck Discomfort- new.  Most  likely a small, reactive superficial anterior cervical LN.  No evidence of infxn on PE.  No red flags on hx.  Encouraged him to monitor his sxs as they will likely resolve w/ time.  He is to let us  know if things fail to improve or worsen.  Pt expressed understanding and is in agreement w/ plan.   "

## 2024-10-15 ENCOUNTER — Other Ambulatory Visit: Payer: Self-pay | Admitting: Family Medicine

## 2024-10-15 ENCOUNTER — Encounter: Payer: Self-pay | Admitting: Family Medicine

## 2024-10-15 MED ORDER — METHOCARBAMOL 500 MG PO TABS: 500.0000 mg | ORAL_TABLET | Freq: Three times a day (TID) | ORAL | 0 refills | Status: AC | PRN

## 2024-10-15 NOTE — Telephone Encounter (Signed)
 Okay to refill.

## 2024-10-16 ENCOUNTER — Ambulatory Visit (HOSPITAL_COMMUNITY): Admission: EM | Admit: 2024-10-16 | Discharge: 2024-10-16 | Disposition: A | Discharge status: Home / Self Care

## 2024-10-16 ENCOUNTER — Telehealth (HOSPITAL_COMMUNITY): Payer: Self-pay

## 2024-10-16 ENCOUNTER — Encounter (HOSPITAL_COMMUNITY): Payer: Self-pay | Admitting: Emergency Medicine

## 2024-10-16 DIAGNOSIS — N4889 Other specified disorders of penis: Secondary | ICD-10-CM | POA: Diagnosis not present

## 2024-10-16 LAB — POCT URINE DIPSTICK
Bilirubin, UA: NEGATIVE
Blood, UA: NEGATIVE
Glucose, UA: NEGATIVE mg/dL
Ketones, POC UA: NEGATIVE mg/dL
Leukocytes, UA: NEGATIVE
Nitrite, UA: NEGATIVE
POC PROTEIN,UA: NEGATIVE
Spec Grav, UA: 1.02
Urobilinogen, UA: 1 U/dL
pH, UA: 7

## 2024-10-16 LAB — CYTOLOGY, (ORAL, ANAL, URETHRAL) ANCILLARY ONLY
Chlamydia: NEGATIVE
Comment: NEGATIVE
Comment: NEGATIVE
Comment: NORMAL
Neisseria Gonorrhea: NEGATIVE
Trichomonas: NEGATIVE

## 2024-10-16 MED ORDER — CLOTRIMAZOLE 1 % EX CREA: TOPICAL_CREAM | CUTANEOUS | 0 refills | Status: AC

## 2024-10-16 MED ORDER — PREDNISONE 10 MG PO TABS: 20.0000 mg | ORAL_TABLET | Freq: Every day | ORAL | 0 refills | Status: AC

## 2024-10-16 NOTE — Telephone Encounter (Signed)
 Returned pt's call. Pt states his cyst ruptured and calling to see what to do. States went to another UC and was given antibiotics. All questions answered.

## 2024-10-16 NOTE — ED Triage Notes (Signed)
 Pt reports last night noticed skin at base of penis that connects to scrotum was inflamed. Reports some swelling. Denies any urinary problems. Pain when area is palpated.

## 2024-10-16 NOTE — ED Provider Notes (Signed)
 " PCP: Bradley [name] ORN, MD Chief Complaint: Groin Swelling    Subjective:   HPI: Patient is a 29 y.o. male here for a thickened area on the underside of his penis that he noticed yesterday.  Patient is a sexually active male with the same partner with no high risk sexual activities reports a thickening and swelling on the underside of his penis.  He denies any urinary symptoms, or drainage from his penis.  He denies any sores or other swelling that is noticed.  He does note when he has an erection this area does become slightly longer and a little bit tender.  He denies any redness or fevers.  Past Medical History:  Diagnosis Date   Allergy to alpha-gal    Anxiety    Chronic headaches    Clavicular fracture    Diverticulosis    History of chickenpox    Mild intermittent asthma 06/07/2018   Shingles    29 years old.   Tubular adenoma of colon    Urticaria 05/18/2020    Medications Ordered Prior to Encounter[1]  BP 116/77 (BP Location: Left Arm)   Pulse 72   Temp (!) 97.3 F (36.3 C) (Oral)   Resp 18   SpO2 95%        Objective:   Gen: Well developed, well nourished male in no acute distress. HEENT: Pupils equal, round, and reactive to light.  Conjunctiva non-injected.  Nares patent without discharge.  Oral mucosa is moist and pink.  Lungs: Normal respiratory effort, nonlabored breathing GU: Circumcised male with no superficial sores or ulcers, on the raphe of the penis towards the base of the scrotum there is an area that is thickened and slightly harder than the surrounding tissue.  No surrounding erythema or superficial sore, bilateral testes are nontender to palpate, no surrounding lymphadenopathy MSK: Joints and muscles are symmetrical with no swelling, redness, or deformity. Ext: No cyanosis, clubbing, or edema.  Assessment/Plan:   Bradley [name] is a 29 y.o. male who was seen today for the following: 1. Penile irritation (Primary) - POC Urinalysis Dipstick;  Standing - POC Urinalysis Dipstick - Patient has 1 day of an area of inflammation on the raphe of the penis - At this time we will treat conservatively with topical antifungal as well as short course of steroids - Discussed that this does not resolve within the next week he should visit with urology - If there are any worsening signs or symptoms of illness including fevers, swelling, redness, drainage changes go to the emergency department - POCT urine was normal - Swab for chlamydia and gonorrhea obtained - We will call patient with results once they are completed  Follow-up/Education:   May return sooner as needed and encouraged to call/e-mail for additional questions or  worsening symptoms in the interim.  Bradley Lowing, DO Sports Medicine Fellow 10/16/2024 9:00 AM  Disclaimer: This transcription was electronically signed. It was transcribed by [employer] and may contain errors in the text that were not recognized on proofreading.      [1]  No current facility-administered medications on file prior to encounter.   Current Outpatient Medications on File Prior to Encounter  Medication Sig Dispense Refill   albuterol  (PROVENTIL ) (2.5 MG/3ML) 0.083% nebulizer solution Take 3 mLs (2.5 mg total) by nebulization every 4 (four) hours as needed for wheezing or shortness of breath (coughing fits). 75 mL 1   albuterol  (VENTOLIN  HFA) 108 (90 Base) MCG/ACT inhaler INHALE 2 PUFFS INTO THE LUNGS  EVERY 4 HOURS AS NEEDED FOR WHHEZING OR SHORTNESS OF BREATH(COUGHING) 36 g 1   Albuterol -Budesonide (AIRSUPRA ) 90-80 MCG/ACT AERO Inhale 2 puffs into the lungs every 4 (four) hours as needed (coughing, wheezing, chest tightness). Do not exceed 12 puffs in 24 hours. (Patient not taking: Reported on 10/10/2024) 10.7 g 2   citalopram  (CELEXA ) 20 MG tablet TAKE 1 TABLET(20 MG) BY MOUTH DAILY 90 tablet 0   clonazePAM  (KLONOPIN ) 0.5 MG tablet TAKE 1 TABLET(0.5 MG) BY MOUTH TWICE DAILY AS NEEDED FOR ANXIETY 30 tablet 0    cycloSPORINE (RESTASIS OP) Apply to eye.     EPINEPHrine  (AUVI-Q ) 0.3 mg/0.3 mL IJ SOAJ injection Inject 0.3 mg into the muscle as needed for anaphylaxis. 2 each 1   fluticasone  furoate-vilanterol (BREO ELLIPTA ) 200-25 MCG/ACT AEPB Inhale 1 puff into the lungs daily. Rinse your mouth after each use. 60 each 5   levocetirizine (XYZAL ) 5 MG tablet Take 1 tablet (5 mg total) by mouth every evening. 30 tablet 5   methocarbamol  (ROBAXIN ) 500 MG tablet Take 1 tablet (500 mg total) by mouth every 8 (eight) hours as needed for muscle spasms. 30 tablet 0   MIEBO 1.338 GM/ML SOLN      sildenafil  (VIAGRA ) 100 MG tablet TAKE 1/2 TO 1 TABLET(50 TO 100 MG) BY MOUTH DAILY AS NEEDED FOR ERECTILE DYSFUNCTION 10 tablet 1     Bradley [name] M, DO 10/16/24 0900  "

## 2024-10-18 LAB — ALPHA-GAL PANEL
Allergen Lamb IgE: 1.89 kU/L — AB
Beef IgE: 1.93 kU/L — AB
IgE (Immunoglobulin E), Serum: 116 [IU]/mL (ref 6–495)
O215-IgE Alpha-Gal: 2.27 kU/L — AB
Pork IgE: 1.37 kU/L — AB

## 2024-10-21 ENCOUNTER — Ambulatory Visit: Payer: Self-pay | Admitting: Allergy

## 2024-10-22 ENCOUNTER — Encounter: Payer: Self-pay | Admitting: Family Medicine

## 2024-10-22 ENCOUNTER — Ambulatory Visit: Admitting: Family Medicine

## 2024-10-22 VITALS — BP 116/86 | HR 85 | Temp 97.9°F | Wt 375.6 lb

## 2024-10-22 DIAGNOSIS — Z6841 Body Mass Index (BMI) 40.0 and over, adult: Secondary | ICD-10-CM | POA: Diagnosis not present

## 2024-10-22 MED ORDER — TIRZEPATIDE-WEIGHT MANAGEMENT 2.5 MG/0.5ML ~~LOC~~ SOLN: 2.5000 mg | SUBCUTANEOUS | 1 refills | Status: AC

## 2024-10-22 MED ORDER — TIRZEPATIDE-WEIGHT MANAGEMENT 2.5 MG/0.5ML ~~LOC~~ SOLN: 2.5000 mg | SUBCUTANEOUS | 1 refills | Status: DC

## 2024-10-22 NOTE — Progress Notes (Signed)
 "  Established Patient Office Visit  Subjective   Patient ID: Bradley [name], male    DOB: January 07, 1996  Age: 29 y.o. MRN: [id number]  Chief Complaint  Patient presents with   Medication Consultation    HPI   Bradley [name] is here to discuss weight loss options and possible medications.  He has morbid obesity with BMI over 40.  He struggled with his weight for many years.  His other chronic medical problems include history of migraine headaches, asthma, GERD.  He has tried regular exercise along with low calorie and low carbohydrate diets without success.  Has tried Nutrisystem's previously also without success.  He does not have any diabetes history but has strong family history of type 2 diabetes in his father and his mother had gestational diabetes and has prediabetes.  Bradley [name] does not have any history of pancreatitis.  He had transient mild elevation of lipase following his surgery but no documented history of pancreatitis.  No family history of medullary thyroid  cancer.  Past Medical History:  Diagnosis Date   Allergy to alpha-gal    Anxiety    Chronic headaches    Clavicular fracture    Diverticulosis    History of chickenpox    Mild intermittent asthma 06/07/2018   Shingles    29 years old.   Tubular adenoma of colon    Urticaria 05/18/2020   Past Surgical History:  Procedure Laterality Date   COLONOSCOPY     CYSTECTOMY  01/2019   scrotum area    ESOPHAGEAL MANOMETRY N/A 06/09/2020   Procedure: ESOPHAGEAL MANOMETRY (EM);  Surgeon: San Sandor GAILS, DO;  Location: [facility];  Service: Gastroenterology;  Laterality: N/A;   ESOPHAGOGASTRODUODENOSCOPY     shoulder surgery Right 08/26/2019   WISDOM TOOTH EXTRACTION      reports that he has never smoked. He has never used smokeless tobacco. He reports current alcohol use of about 3.0 standard drinks of alcohol per week. He reports current drug use. Frequency: 2.00 times per week. Drug: Marijuana. family history includes Colon  polyps in his mother; Diabetes in his father and maternal grandmother; Hypertension in his mother; Migraines in his sister. Allergies[1]  Review of Systems  Constitutional:  Negative for malaise/fatigue.  Eyes:  Negative for blurred vision.  Respiratory:  Negative for shortness of breath.   Cardiovascular:  Negative for chest pain.  Gastrointestinal:  Negative for abdominal pain, diarrhea, nausea and vomiting.  Neurological:  Negative for dizziness, weakness and headaches.      Objective:     BP 116/86   Pulse 85   Temp 97.9 F (36.6 C) (Oral)   Wt (!) 375 lb 9.6 oz (170.4 kg)   SpO2 96%   BMI 43.40 kg/m  BP Readings from Last 3 Encounters:  10/22/24 116/86  10/16/24 116/77  10/10/24 118/86   Wt Readings from Last 3 Encounters:  10/22/24 (!) 375 lb 9.6 oz (170.4 kg)  10/10/24 (!) 375 lb (170.1 kg)  09/15/24 (!) 371 lb 9.6 oz (168.6 kg)      Physical Exam Vitals reviewed.  Constitutional:      General: He is not in acute distress.    Appearance: He is well-developed.  Eyes:     Pupils: Pupils are equal, round, and reactive to light.  Neck:     Thyroid : No thyromegaly.  Cardiovascular:     Rate and Rhythm: Normal rate and regular rhythm.     Heart sounds: No murmur heard. Pulmonary:     Effort:  Pulmonary effort is normal. No respiratory distress.     Breath sounds: Normal breath sounds. No wheezing or rales.  Musculoskeletal:     Cervical back: Neck supple.  Neurological:     Mental Status: He is alert and oriented to person, place, and time.      No results found for any visits on 10/22/24.    The ASCVD Risk score (Arnett DK, et al., 2019) failed to calculate for the following reasons:   The 2019 ASCVD risk score is only valid for ages 1 to 34   * - Cholesterol units were assumed    Assessment & Plan:   Morbid obesity with BMI 43.40.  He has comorbidities including history of asthma, GERD, family history of type 2 diabetes.  Bradley [name] has previously  tried [employer], low carbohydrate and low calorie diets, and regular exercise without success.  He specifically has questions regarding GLP-1 medications.  No contraindications.  He would like to try Zepbound  and we recommend starting 2.5 mg subcutaneous once weekly.  If we cannot get insurance approval for this we will look at possible direct to consumer programs.  We discussed potential side effects including nausea and diarrhea.  Recommend given us  feedback in 1 month if tolerating well so we can consider further titration.  No follow-ups on file.    Wolm Scarlet, MD     [1]  Allergies Allergen Reactions   Hydrocodone-Acetaminophen  Itching   Shellfish Allergy Anaphylaxis   Shellfish Protein-Containing Drug Products Other (See Comments)   Hydrocodone Itching    Pt states he feels itchy after taking, but it doesn't preclude him taking hydrocodone if needed About 2-3 years ago.  It was the first time he took hydrocodone.   Other Diarrhea    Slightly allergic to red meat since tick bite.SABRASABRANot anaphylaxis, but GI sensitivity.   "

## 2024-10-22 NOTE — Patient Instructions (Signed)
 Give me some feedback in one month regarding tolerance.

## 2024-10-24 ENCOUNTER — Encounter: Payer: Self-pay | Admitting: Family Medicine

## 2024-11-11 ENCOUNTER — Ambulatory Visit (INDEPENDENT_AMBULATORY_CARE_PROVIDER_SITE_OTHER): Admitting: Otolaryngology
